# Patient Record
Sex: Male | Born: 1935 | Race: White | Hispanic: No | Marital: Married | Smoking: Former smoker
Health system: Southern US, Community
[De-identification: ages and names within clinical notes are randomized; demographics above are authoritative.]

## PROBLEM LIST (undated history)

## (undated) DIAGNOSIS — R350 Frequency of micturition: Secondary | ICD-10-CM

## (undated) DIAGNOSIS — E7800 Pure hypercholesterolemia, unspecified: Secondary | ICD-10-CM

## (undated) DIAGNOSIS — E119 Type 2 diabetes mellitus without complications: Secondary | ICD-10-CM

## (undated) DIAGNOSIS — E669 Obesity, unspecified: Secondary | ICD-10-CM

## (undated) DIAGNOSIS — I5042 Chronic combined systolic (congestive) and diastolic (congestive) heart failure: Secondary | ICD-10-CM

## (undated) DIAGNOSIS — I442 Atrioventricular block, complete: Secondary | ICD-10-CM

## (undated) DIAGNOSIS — J189 Pneumonia, unspecified organism: Secondary | ICD-10-CM

## (undated) DIAGNOSIS — I10 Essential (primary) hypertension: Secondary | ICD-10-CM

## (undated) DIAGNOSIS — Z950 Presence of cardiac pacemaker: Secondary | ICD-10-CM

## (undated) DIAGNOSIS — R32 Unspecified urinary incontinence: Secondary | ICD-10-CM

## (undated) DIAGNOSIS — I2510 Atherosclerotic heart disease of native coronary artery without angina pectoris: Secondary | ICD-10-CM

## (undated) DIAGNOSIS — C4491 Basal cell carcinoma of skin, unspecified: Secondary | ICD-10-CM

## (undated) DIAGNOSIS — C439 Malignant melanoma of skin, unspecified: Secondary | ICD-10-CM

## (undated) DIAGNOSIS — Z9289 Personal history of other medical treatment: Secondary | ICD-10-CM

## (undated) HISTORY — PX: TONSILLECTOMY: SUR1361

## (undated) HISTORY — PX: APPENDECTOMY: SHX54

## (undated) HISTORY — DX: Essential (primary) hypertension: I10

## (undated) HISTORY — PX: BASAL CELL CARCINOMA EXCISION: SHX1214

## (undated) HISTORY — DX: Personal history of other medical treatment: Z92.89

## (undated) HISTORY — PX: INSERT / REPLACE / REMOVE PACEMAKER: SUR710

## (undated) HISTORY — DX: Atherosclerotic heart disease of native coronary artery without angina pectoris: I25.10

## (undated) HISTORY — DX: Atrioventricular block, complete: I44.2

## (undated) HISTORY — DX: Obesity, unspecified: E66.9

## (undated) HISTORY — PX: MOHS SURGERY: SUR867

---
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
## 1998-07-24 ENCOUNTER — Ambulatory Visit (HOSPITAL_COMMUNITY): Admission: RE | Admit: 1998-07-24 | Discharge: 1998-07-24 | Payer: Self-pay | Admitting: Family Medicine

## 1998-09-08 ENCOUNTER — Ambulatory Visit (HOSPITAL_COMMUNITY): Admission: RE | Admit: 1998-09-08 | Discharge: 1998-09-08 | Payer: Self-pay | Admitting: Family Medicine

## 1999-10-23 ENCOUNTER — Encounter: Payer: Self-pay | Admitting: Family Medicine

## 1999-10-23 ENCOUNTER — Encounter: Admission: RE | Admit: 1999-10-23 | Discharge: 1999-10-23 | Payer: Self-pay | Admitting: Family Medicine

## 2004-07-05 ENCOUNTER — Ambulatory Visit: Payer: Self-pay | Admitting: Cardiology

## 2004-07-05 ENCOUNTER — Inpatient Hospital Stay (HOSPITAL_COMMUNITY): Admission: EM | Admit: 2004-07-05 | Discharge: 2004-07-10 | Payer: Self-pay | Admitting: Emergency Medicine

## 2004-07-05 ENCOUNTER — Encounter: Payer: Self-pay | Admitting: Cardiology

## 2004-07-17 ENCOUNTER — Ambulatory Visit: Payer: Self-pay | Admitting: Pulmonary Disease

## 2004-07-17 ENCOUNTER — Ambulatory Visit (HOSPITAL_BASED_OUTPATIENT_CLINIC_OR_DEPARTMENT_OTHER): Admission: RE | Admit: 2004-07-17 | Discharge: 2004-07-17 | Payer: Self-pay | Admitting: Internal Medicine

## 2004-07-23 ENCOUNTER — Ambulatory Visit: Payer: Self-pay | Admitting: Internal Medicine

## 2004-07-23 ENCOUNTER — Ambulatory Visit: Payer: Self-pay

## 2004-07-24 ENCOUNTER — Ambulatory Visit: Payer: Self-pay

## 2004-10-10 ENCOUNTER — Ambulatory Visit: Payer: Self-pay | Admitting: Internal Medicine

## 2004-11-08 ENCOUNTER — Ambulatory Visit: Payer: Self-pay | Admitting: Internal Medicine

## 2005-07-11 ENCOUNTER — Ambulatory Visit: Payer: Self-pay | Admitting: Internal Medicine

## 2006-01-23 ENCOUNTER — Ambulatory Visit: Payer: Self-pay | Admitting: Internal Medicine

## 2006-07-14 ENCOUNTER — Ambulatory Visit: Payer: Self-pay

## 2006-11-12 ENCOUNTER — Ambulatory Visit: Payer: Self-pay | Admitting: Internal Medicine

## 2007-07-06 ENCOUNTER — Ambulatory Visit: Payer: Self-pay

## 2007-10-07 ENCOUNTER — Ambulatory Visit: Payer: Self-pay | Admitting: Internal Medicine

## 2008-01-12 ENCOUNTER — Ambulatory Visit: Payer: Self-pay | Admitting: Internal Medicine

## 2008-04-13 ENCOUNTER — Ambulatory Visit: Payer: Self-pay | Admitting: Internal Medicine

## 2008-07-22 ENCOUNTER — Ambulatory Visit: Payer: Self-pay | Admitting: Internal Medicine

## 2008-08-26 ENCOUNTER — Encounter: Payer: Self-pay | Admitting: Pulmonary Disease

## 2008-09-09 ENCOUNTER — Ambulatory Visit: Payer: Self-pay | Admitting: Family Medicine

## 2008-09-16 ENCOUNTER — Ambulatory Visit: Payer: Self-pay | Admitting: Family Medicine

## 2008-10-26 ENCOUNTER — Ambulatory Visit: Payer: Self-pay | Admitting: Internal Medicine

## 2008-11-18 ENCOUNTER — Encounter (INDEPENDENT_AMBULATORY_CARE_PROVIDER_SITE_OTHER): Payer: Self-pay

## 2009-01-06 ENCOUNTER — Ambulatory Visit: Payer: Self-pay | Admitting: Family Medicine

## 2009-01-07 DIAGNOSIS — I10 Essential (primary) hypertension: Secondary | ICD-10-CM | POA: Insufficient documentation

## 2009-01-10 ENCOUNTER — Ambulatory Visit: Payer: Self-pay | Admitting: Internal Medicine

## 2009-01-10 DIAGNOSIS — I442 Atrioventricular block, complete: Secondary | ICD-10-CM

## 2009-01-30 ENCOUNTER — Ambulatory Visit: Payer: Self-pay | Admitting: Gastroenterology

## 2009-02-17 ENCOUNTER — Ambulatory Visit: Payer: Self-pay | Admitting: Internal Medicine

## 2009-02-17 ENCOUNTER — Encounter: Payer: Self-pay | Admitting: Internal Medicine

## 2009-02-17 LAB — HM COLONOSCOPY

## 2009-02-21 ENCOUNTER — Encounter: Payer: Self-pay | Admitting: Internal Medicine

## 2009-04-12 ENCOUNTER — Ambulatory Visit: Payer: Self-pay | Admitting: Internal Medicine

## 2009-04-27 ENCOUNTER — Encounter: Payer: Self-pay | Admitting: Internal Medicine

## 2009-05-12 ENCOUNTER — Ambulatory Visit: Payer: Self-pay | Admitting: Family Medicine

## 2009-05-26 ENCOUNTER — Encounter: Payer: Self-pay | Admitting: Family Medicine

## 2009-05-26 ENCOUNTER — Ambulatory Visit: Payer: Self-pay | Admitting: Vascular Surgery

## 2009-05-26 ENCOUNTER — Ambulatory Visit (HOSPITAL_COMMUNITY): Admission: RE | Admit: 2009-05-26 | Discharge: 2009-05-26 | Payer: Self-pay | Admitting: Family Medicine

## 2009-07-20 ENCOUNTER — Encounter: Payer: Self-pay | Admitting: Internal Medicine

## 2009-07-21 ENCOUNTER — Ambulatory Visit: Payer: Self-pay | Admitting: Internal Medicine

## 2009-07-21 DIAGNOSIS — R609 Edema, unspecified: Secondary | ICD-10-CM

## 2009-07-21 DIAGNOSIS — R5383 Other fatigue: Secondary | ICD-10-CM

## 2009-07-21 DIAGNOSIS — R5381 Other malaise: Secondary | ICD-10-CM

## 2009-07-21 DIAGNOSIS — G4733 Obstructive sleep apnea (adult) (pediatric): Secondary | ICD-10-CM | POA: Insufficient documentation

## 2009-07-31 ENCOUNTER — Telehealth (INDEPENDENT_AMBULATORY_CARE_PROVIDER_SITE_OTHER): Payer: Self-pay | Admitting: *Deleted

## 2009-08-07 LAB — CONVERTED CEMR LAB
BUN: 21 mg/dL (ref 6–23)
CO2: 27 meq/L (ref 19–32)
Calcium: 9.5 mg/dL (ref 8.4–10.5)
Glucose, Bld: 128 mg/dL — ABNORMAL HIGH (ref 70–99)
Potassium: 4.2 meq/L (ref 3.5–5.3)
Sodium: 144 meq/L (ref 135–145)

## 2009-08-08 ENCOUNTER — Encounter: Payer: Self-pay | Admitting: Internal Medicine

## 2009-08-11 ENCOUNTER — Ambulatory Visit (HOSPITAL_COMMUNITY): Admission: RE | Admit: 2009-08-11 | Discharge: 2009-08-11 | Payer: Self-pay | Admitting: Internal Medicine

## 2009-08-11 ENCOUNTER — Ambulatory Visit: Payer: Self-pay | Admitting: Cardiovascular Disease

## 2009-08-11 ENCOUNTER — Encounter: Payer: Self-pay | Admitting: Internal Medicine

## 2009-08-11 ENCOUNTER — Ambulatory Visit: Payer: Self-pay

## 2009-08-16 ENCOUNTER — Telehealth (INDEPENDENT_AMBULATORY_CARE_PROVIDER_SITE_OTHER): Payer: Self-pay | Admitting: *Deleted

## 2009-08-29 ENCOUNTER — Ambulatory Visit: Payer: Self-pay | Admitting: Pulmonary Disease

## 2009-08-29 DIAGNOSIS — F341 Dysthymic disorder: Secondary | ICD-10-CM

## 2009-09-11 ENCOUNTER — Ambulatory Visit: Payer: Self-pay | Admitting: Family Medicine

## 2009-10-20 ENCOUNTER — Encounter: Payer: Self-pay | Admitting: Internal Medicine

## 2009-10-25 ENCOUNTER — Ambulatory Visit: Payer: Self-pay | Admitting: Internal Medicine

## 2009-10-31 ENCOUNTER — Encounter: Payer: Self-pay | Admitting: Internal Medicine

## 2009-12-01 ENCOUNTER — Ambulatory Visit: Payer: Self-pay | Admitting: Cardiology

## 2009-12-01 ENCOUNTER — Inpatient Hospital Stay (HOSPITAL_COMMUNITY): Admission: AD | Admit: 2009-12-01 | Discharge: 2009-12-06 | Payer: Self-pay | Admitting: Internal Medicine

## 2009-12-01 ENCOUNTER — Ambulatory Visit: Payer: Self-pay | Admitting: Family Medicine

## 2009-12-02 ENCOUNTER — Encounter (INDEPENDENT_AMBULATORY_CARE_PROVIDER_SITE_OTHER): Payer: Self-pay | Admitting: Internal Medicine

## 2009-12-02 ENCOUNTER — Ambulatory Visit: Payer: Self-pay | Admitting: Vascular Surgery

## 2009-12-03 ENCOUNTER — Encounter (INDEPENDENT_AMBULATORY_CARE_PROVIDER_SITE_OTHER): Payer: Self-pay | Admitting: Internal Medicine

## 2009-12-04 ENCOUNTER — Ambulatory Visit: Payer: Self-pay | Admitting: Infectious Diseases

## 2009-12-11 ENCOUNTER — Ambulatory Visit: Payer: Self-pay | Admitting: Family Medicine

## 2009-12-28 ENCOUNTER — Telehealth: Payer: Self-pay | Admitting: Pulmonary Disease

## 2010-01-11 ENCOUNTER — Ambulatory Visit: Payer: Self-pay | Admitting: Family Medicine

## 2010-01-18 ENCOUNTER — Telehealth: Payer: Self-pay | Admitting: Internal Medicine

## 2010-01-18 ENCOUNTER — Telehealth (INDEPENDENT_AMBULATORY_CARE_PROVIDER_SITE_OTHER): Payer: Self-pay | Admitting: *Deleted

## 2010-01-30 ENCOUNTER — Encounter: Payer: Self-pay | Admitting: Internal Medicine

## 2010-01-31 ENCOUNTER — Ambulatory Visit: Payer: Self-pay | Admitting: Internal Medicine

## 2010-02-01 ENCOUNTER — Ambulatory Visit: Payer: Self-pay | Admitting: Family Medicine

## 2010-02-08 ENCOUNTER — Ambulatory Visit: Payer: Self-pay | Admitting: Family Medicine

## 2010-02-15 ENCOUNTER — Ambulatory Visit: Payer: Self-pay | Admitting: Family Medicine

## 2010-02-21 ENCOUNTER — Encounter: Payer: Self-pay | Admitting: Internal Medicine

## 2010-03-19 ENCOUNTER — Ambulatory Visit: Payer: Self-pay | Admitting: Family Medicine

## 2010-05-10 ENCOUNTER — Encounter: Payer: Self-pay | Admitting: Internal Medicine

## 2010-05-16 ENCOUNTER — Ambulatory Visit: Payer: Self-pay | Admitting: Internal Medicine

## 2010-06-08 ENCOUNTER — Encounter (INDEPENDENT_AMBULATORY_CARE_PROVIDER_SITE_OTHER): Payer: Self-pay | Admitting: *Deleted

## 2010-07-10 ENCOUNTER — Ambulatory Visit: Payer: Self-pay | Admitting: Internal Medicine

## 2010-07-11 ENCOUNTER — Telehealth: Payer: Self-pay | Admitting: Internal Medicine

## 2010-07-19 ENCOUNTER — Ambulatory Visit: Payer: Self-pay | Admitting: Family Medicine

## 2010-07-31 ENCOUNTER — Ambulatory Visit
Admission: RE | Admit: 2010-07-31 | Discharge: 2010-07-31 | Payer: Self-pay | Source: Home / Self Care | Attending: Family Medicine | Admitting: Family Medicine

## 2010-08-31 ENCOUNTER — Ambulatory Visit
Admission: RE | Admit: 2010-08-31 | Discharge: 2010-08-31 | Payer: Self-pay | Source: Home / Self Care | Attending: Family Medicine | Admitting: Family Medicine

## 2010-09-04 NOTE — Cardiovascular Report (Signed)
Summary: Office Visit Remote   Office Visit Remote   Imported By: Roderic Ovens 06/12/2010 15:30:30  _____________________________________________________________________  External Attachment:    Type:   Image     Comment:   External Document

## 2010-09-04 NOTE — Letter (Signed)
Summary: Device-Delinquent Phone Journalist, newspaper, Main Office  [address]   [place], [place] [address]   Phone: [phone]  Fax: [phone]     October 20, 2009 MRN: [id number]   LATASHA PUSKAS [address] [place], [place]  [address]   Dear Mr. NIAZI,  According to our records, you were scheduled for a device phone transmission on   October 18, 2009.     We did not receive any results from this check.  If you transmitted on your scheduled day, please call us to help troubleshoot your system.  If you forgot to send your transmission, please send one upon receipt of this letter.  Thank you,   Architectural technologist [facility]

## 2010-09-04 NOTE — Letter (Signed)
Summary: Remote Device Check  [employer], Main Office  [address]   [place], [place] [address]   Phone: [phone]  Fax: [phone]     June 08, 2010 MRN: [id number]   WALI REINHEIMER [address] [place], [place]  [address]   Dear Mr. HJORT,   Your remote transmission was recieved and reviewed by your physician.  All diagnostics were within normal limits for you.  _____Your next transmission is scheduled for:                       .  Please transmit at any time this day.  If you have a wireless device your transmission will be sent automatically.  _X_____Your next office visit is scheduled for: 07/10/2010 @ 2:40pm with Dr. Graciela Husbands.                                Sincerely,  Altha Harm, LPN

## 2010-09-04 NOTE — Assessment & Plan Note (Signed)
Summary: pc2 medtronic   Visit Type:  Pacemaker check Referring Provider:  [name] Primary Provider:  Sharlot Gowda  CC:  no cardiac complaints.  History of Present Illness: Mr. Malik Rojas is seen in followup for high-grade heart block and presyncope and is status post pacemaker implantation. He has had no recurrent syncope.  he does complain of some exercise intolerance. He is also having problems with peripheral edema.    Since his last visit he underwent echocardiography demonstrating normal left ventricular function and modest elevation of PA pressures.   Things are othewise stable without significant chestpains, changes in breathing status.  His legs remain swollen and red.  He is following closely iwth Dr Susann Givens  Evaluation includes cath 2005 non obstructive CAD wit EF 55% confirmed by echo with mild MR.  Review of his pacemaker records demonstrated that he developed high-grade heart block sometime in 2006 resulting in a nearly 100% ventricular pacing.   Current Medications (verified): 1)  Aspirin 325 Mg Tabs (Aspirin) .Marland Kitchen.. 1 Once Daily 2)  Prinzide 20-25 Mg Tabs (Lisinopril-Hydrochlorothiazide) .... Two Times A Day 3)  Janumet 50-1000 Mg Tabs (Sitagliptin-Metformin Hcl) .... Two Times A Day 4)  Amlodipine Besylate 5 Mg Tabs (Amlodipine Besylate) .... Take Once Daily 5)  Glimepiride 1 Mg Tabs (Glimepiride) .Marland Kitchen.. 1 Two Times A Day 6)  Pravastatin Sodium 80 Mg Tabs (Pravastatin Sodium) .Marland Kitchen.. 1 Once Daily  Allergies: No Known Drug Allergies  Vital Signs:  Patient profile:   77 year old male Height:      67 inches Weight:      244 pounds BMI:     38.35 Pulse rate:   96 / minute Pulse rhythm:   regular BP sitting:   154 / 72  (left arm) Cuff size:   large  Vitals Entered By: Scherrie Bateman, LPN (July 10, 2010 3:03 PM)  Physical Exam  General:  The patient was alert and oriented in no acute distress. HEENT Normal.  Neck veins were flat, carotids were brisk.  Lungs  were clear.  Heart sounds were regular without murmurs or gallops.  Abdomen was soft with active bowel sounds. There is no clubbing cyanosis ; erythema and edema bilaterally 2-3+ Skin Warm and dry    PPM Specifications Following MD:  Sherryl Manges, MD     PPM Vendor:  Medtronic     PPM Model Number:  [id number]     PPM Serial Number:  [id number] PPM DOI:  07/09/2004     PPM Implanting MD:  Sherryl Manges, MD  Lead 1    Location: RA     DOI: 07/09/2004     Model #: [id number]     Serial #: [id number]     Status: active Lead 2    Location: RV     DOI: 07/09/2004     Model #: [id number]     Serial #: [id number]     Status: active  Magnet Response Rate:  BOL 85 ERI  65  Indications:  Syncope;LBBB;Sinus node arrest   PPM Follow Up Remote Check?  No Battery Voltage:  2.96 V     Pacer Dependent:  Yes       PPM Device Measurements Atrium  Amplitude: 4.4 mV, Impedance: 576 ohms, Threshold: 1.6 V at 0.6 msec Right Ventricle  Impedance: 672 ohms, Threshold: 0.5 V at 0.4 msec  Episodes MS Episodes:  1     Percent Mode Switch:  <0.1%     Coumadin:  No Atrial Pacing:  6.9%     Ventricular Pacing:  99.9%  Parameters Mode:  DDDR+     Lower Rate Limit:  60     Upper Rate Limit:  130 Paced AV Delay:  250     Sensed AV Delay:  250 Next Remote Date:  10/11/2010     Next Cardiology Appt Due:  08/05/2010 Tech Comments:  No parameter changes.  Atrial tracking @ 97bpm.  [facility]link transmissions every 3 months.   ROV 1 year with Dr. Graciela Husbands. Altha Harm, LPN  July 10, 2010 3:04 PM   Impression & Recommendations:  Problem # 1:  PACEMAKER, PERMANENT-MEDTRONIC ENRHYTHM P1501DR (ICD-V45.01) Device parameters and data were reviewed and no changes were made  Problem # 2:  SECOND DEGREE ATRIOVENTRICULAR HEART BLOCK (ICD-426.13) stabvle  post pacer;  100% v pacing His updated medication list for this problem includes:    Aspirin 325 Mg Tabs (Aspirin) .Marland Kitchen... 1 once daily    Prinzide 20-25 Mg Tabs  (Lisinopril-hydrochlorothiazide) .Marland Kitchen..Marland Kitchen Two times a day    Amlodipine Besylate 5 Mg Tabs (Amlodipine besylate) .Marland Kitchen... Take once daily  Problem # 3:  EDEMA (ICD-782.3) he is to follow up with Dr Susann Givens in the next two weeks.    Patient Instructions: 1)  Your physician recommends that you continue on your current medications as directed. Please refer to the Current Medication list given to you today. Call us at [phone] to provide an updated list of your medications. 2)  Your physician wants you to follow-up in: 1 year  You will receive a reminder letter in the mail two months in advance. If you don't receive a letter, please call our office to schedule the follow-up appointment.

## 2010-09-04 NOTE — Letter (Signed)
Summary: Remote Device Check  [employer], Main Office  [address]   [place], [place] [address]   Phone: [phone]  Fax: [phone]     October 31, 2009 MRN: [id number]   Malik Rojas [address] [place], [place]  [address]   Dear Mr. UNANGST,   Your remote transmission was recieved and reviewed by your physician.  All diagnostics were within normal limits for you.  __X___Your next transmission is scheduled for: January 31, 2010.  Please transmit at any time this day.  If you have a wireless device your transmission will be sent automatically.     Sincerely,  Proofreader

## 2010-09-04 NOTE — Letter (Signed)
Summary: Device-Delinquent Phone Journalist, newspaper, Main Office  [address]   [place], [place] [address]   Phone: [phone]  Fax: [phone]     May 10, 2010 MRN: [id number]   Malik Rojas [address] [place], [place]  [address]   Dear Mr. OEHLERT,  According to our records, you were scheduled for a device phone transmission on   05-03-10.     We did not receive any results from this check.  If you transmitted on your scheduled day, please call us to help troubleshoot your system.  If you forgot to send your transmission, please send one upon receipt of this letter.  Thank you,   Architectural technologist [facility]

## 2010-09-04 NOTE — Cardiovascular Report (Signed)
Summary: Office Visit Remote   Office Visit Remote   Imported By: Roderic Ovens 11/02/2009 11:58:09  _____________________________________________________________________  External Attachment:    Type:   Image     Comment:   External Document

## 2010-09-04 NOTE — Progress Notes (Signed)
Summary: FYI only  Phone Note Call from Patient   Caller: Patient Call For: Zhane Bluitt Summary of Call: FYI only: pt recently d/c'd from hosp. doesn't want a f/u with vs at this time but will f/u after seeing his pcp. no call back- just FYI since he received a reminder letter to f/u.  Initial call taken by: Tivis Ringer, CNA,  Dec 28, 2009 11:31 AM

## 2010-09-04 NOTE — Cardiovascular Report (Signed)
Summary: Office Visit Remote   Office Visit Remote   Imported By: Roderic Ovens 02/22/2010 10:59:46  _____________________________________________________________________  External Attachment:    Type:   Image     Comment:   External Document

## 2010-09-04 NOTE — Letter (Signed)
Summary: Remote Device Check  [employer], Main Office  [address]   [place], [place] [address]   Phone: [phone]  Fax: [phone]     February 21, 2010 MRN: [id number]   Malik Rojas [address] [place], [place]  [address]   Dear [name],   Your remote transmission was recieved and reviewed by your physician.  All diagnostics were within normal limits for you.  __X___Your next transmission is scheduled for: 05-03-2010.  Please transmit at any time this day.  If you have a wireless device your transmission will be sent automatically.    Sincerely,  Vella Kohler

## 2010-09-04 NOTE — Progress Notes (Signed)
Summary: Dr. Susann Givens to start CPAP  Phone Note Call from Patient Call back at Home Phone [phone]   Caller: Spouse//jolene Call For: sood Reason for Call: Talk to Nurse Summary of Call: Wants to know if sleep study results were sent here. Initial call taken by: Darletta Moll,  January 18, 2010 2:03 PM  Follow-up for Phone Call        Home sleep test ordered by Dr. Graciela Husbands is in the patients chart. The pt's wife is calling today because she says no one has been able to help her with getting the CPAP started. I spoke with Bjorn Loser at Dr. Jola Babinski office and she said they only had record of a sleep study from 2005 and the insurance said that he would need a more recent study. I faxed her a copy of the home test done 1/4/201. The pt's spouse is aware that Dr. Susann Givens will move forward with getting him the CPAP since the pt did not want to come back to see Dr. Craige Cotta. I confirmed that [place] received the sleep study. The pt's spouse and Bjorn Loser were very appreciative for all the help we were able to give. Follow-up by: Michel Bickers CMA,  January 18, 2010 4:33 PM

## 2010-09-04 NOTE — Progress Notes (Signed)
Summary: Malik Rojas**  Phone Note Outgoing Call   Call placed by: Duncan Dull, RN, BSN,  August 16, 2009 10:22 AM Call placed to: Patient Summary of Call: S/W wife and ask her to have [name] call me to discuss ECHO results Per Dr. Graciela Husbands, EF and MR ok. Also to discuss sleep study results. Positive for Mild OSA. Per Dr. Graciela Husbands pt will be referred to Pulmonary Dr. Craige Cotta or Dr. Vassie Loll. Order placed. Duncan Dull, RN, BSN  August 16, 2009 10:23 AM   Follow-up for Phone Call        Pt aware. Follow-up by: Duncan Dull, RN, BSN,  August 18, 2009 12:19 PM

## 2010-09-04 NOTE — Progress Notes (Signed)
Summary: results of sleep test  Phone Note Call from Patient Call back at Home Phone [phone]   Caller: Patient [phone] Reason for Call: Talk to Nurse Summary of Call: pt needs results of sleep apnea test Initial call taken by: Glynda Jaeger,  January 18, 2010 1:29 PM  Follow-up for Phone Call        Pt wife will call Dr. Evlyn Courier office about cpap forms to be sent in to [facility].  Dr. Susann Givens will be taking care of this for Mr. Rowland per his wife. Lisabeth Devoid RN

## 2010-09-04 NOTE — Assessment & Plan Note (Signed)
Summary: SLEEP APNEA///kp   Copy to:  [name] Primary Provider/Referring Provider:  Sharlot Gowda  CC:  Sleep consult. Epworth score is 6. The patient had recent home sleep test ordered by Dr. Graciela Husbands. The patient says he had a sleep study done at the sleep center around 2005.Malik Rojas  History of Present Illness: 77 yo male for evaluation of sleep apnea.  He is not sure why he was scheduled for this visit.  He does not feel like he has any problem with his sleep.  He had a bad experience in the sleep lab several years ago, and is quite adamant about not wanting to have any further in-lab testing.  He had a sleep study from 2005, and was found to have severe sleep apnea.  He has not been using CPAP.  Has gained some weight since his original sleep study.  He has been followed by cardiology, and there was concern that his sleep apnea may be contributing to his cardiovascular disease.  As a result he underwent a home sleep test.  This showed evidence for mild sleep apnea.  He goes to bed at 10pm and falls asleep quickly.  He does not use anything to help him sleep, or stay awake.  He wakes up 2 or 3 times per night to use the bathroom.  It can take sometime to fall back to sleep.  He wakes up at 4am to get ready for work.  He estimates sleeping about 5 hours per night.  He feels okay in the morning, and denies headaches.  He will sometimes get sleepy in the evening while watching TV.  He does not have any problems with his driving.  He is not sure if he snores, and has not been told he stops breathing while asleep.  He denies sleep walking, sleep talking, or bruxism.  He does get occasional nightmares.  He relates to life events that happened years ago, but continue to haunt him.  He was somewhat vague about what these previous events were.  He denies feeling depressed.  He did say that he has led his life, and is willing to accept whatever happens.  As a result he does not want to have to put himself  through too many interventions.  He denies restless legs.  He denies sleep hallucinations, sleep paralysis, or cataplexy.   Preventive Screening-Counseling & Management  Alcohol-Tobacco     Smoking Status: quit     Year Quit: 1975     Pack years: 25 years x4-5 ppd  Current Medications (verified): 1)  Aspirin 81 Mg Tabs (Aspirin) .... Once Daily 2)  Prinzide 20-25 Mg Tabs (Lisinopril-Hydrochlorothiazide) .... Two Times A Day 3)  Janumet 50-1000 Mg Tabs (Sitagliptin-Metformin Hcl) .... Two Times A Day 4)  Actos 30 Mg Tabs (Pioglitazone Hcl) .... Once Daily 5)  Amlodipine Besylate 5 Mg Tabs (Amlodipine Besylate) .... Take Once Daily  Allergies (verified): No Known Drug Allergies  Past History:  Past Medical History: complete heart block Diabetes Hypertension Pacemaker-Medtronic EnRhythm P1501DR Obstructive sleep apnea Edema Obesity  Past Surgical History: Reviewed history from 01/07/2009 and no changes required. appendectomy Pacemaker Implantation  Family History: Reviewed history from 01/07/2009 and no changes required. His mother died at age 31, and his father died at age 84. Neither one had heart disease.   He has three siblings, All negative for heart disease  Social History: Reviewed history from 01/07/2009 and no changes required. Full Time Married  Tobacco Use - Former.  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 Epworth score is 6. The  patient had recent home sleep test ordered by Dr. Graciela Husbands. The patient says he had a sleep study done at the sleep center around 2005.   Physical Exam  General:  normal appearance and obese.   Eyes:  PERRLA and EOMI, wears glasses Nose:  no deformity, discharge, inflammation, or lesions Mouth:  MP 3, upper dentures, poor dentition Neck:  no JVD.   Lungs:  clear bilaterally to auscultation and percussion Heart:  regular rate and rhythm, S1, S2 without murmurs, rubs, gallops, or clicks Abdomen:  bowel sounds positive; abdomen soft and non-tender without masses, or organomegaly Extremities:  no clubbing, cyanosis, edema, or deformity noted Neurologic:  CN II-XII grossly intact with normal reflexes, coordination, muscle strength and tone Cervical Nodes:  no significant adenopathy Psych:  depressed affect and easily distracted.     Impression & Recommendations:  Problem # 1:  OBSTRUCTIVE SLEEP APNEA (ICD-327.23)  I reviewed his sleep tests with him.  His sleep apnea appeared much worse from his sleep study in 2005 compared to his home studies done recently.  He denies having any sleep problems.  I explained to him that his sleep apnea is affecting his blood pressure and diabetes, and that he should view treatment of his sleep apnea like taking medicine for these conditions.  He was not interested in doing any type of therapy for his sleep apnea at this time.  He stated that has lived a full life, and does not want to have to put himself through too many interventions such as using CPAP therapy.  We have agreed to give a trial of diet, exercise, and weight reduction.  He will monitor his sleep pattern and symptoms.  Will re-assess whether he wants to reconsider treating his sleep apnea.  I have also advised him that he may not be allowing himself adequate time for sleep, and that it may be benefitial to adjust his sleep time.  Problem # 2:  DEPRESSION/ANXIETY (ICD-300.4) I am concerned he may  have some degree of depression and possibly post-traumatic stress.  I would recommend that he discuss this further with his primary care physician.  Complete Medication List: 1)  Aspirin 81 Mg Tabs (Aspirin) .... Once daily 2)  Prinzide 20-25 Mg Tabs (Lisinopril-hydrochlorothiazide) .... Two times a day 3)  Janumet 50-1000 Mg Tabs (Sitagliptin-metformin hcl) .... Two times a day 4)  Actos 30 Mg Tabs (Pioglitazone hcl) .... Once daily 5)  Amlodipine Besylate 5 Mg Tabs (Amlodipine besylate) .... Take once daily  Other Orders: Consultation Level IV (86578)  Patient Instructions: 1)  Follow up in 6 months   Immunization History:  Influenza Immunization History:    Influenza:  historical (05/19/2009)  Pneumovax Immunization History:    Pneumovax:  historical (05/19/2009)

## 2010-09-04 NOTE — Progress Notes (Signed)
Summary: meds  Phone Note Call from Patient Call back at Home Phone [phone]   Caller: Patient Reason for Call: Talk to Nurse Summary of Call: list of meds aspirin / amlodipine / glimepirid / prabastain / janument / lisinopril.  pt states these are the only meds he is on.  Initial call taken by: Roe Coombs,  July 11, 2010 10:02 AM  Follow-up for Phone Call        pt confirmed some dosages that were missing...  glimepiride-1mg  daily lisinopril/hctz- 20/12.5 two times a day . not able to update system for some reason. will attempt again later.  Follow-up by: Claris Gladden RN,  July 11, 2010 5:49 PM    New/Updated Medications: LISINOPRIL-HYDROCHLOROTHIAZIDE 20-12.5 MG TABS (LISINOPRIL-HYDROCHLOROTHIAZIDE) two times a day GLIMEPIRIDE 1 MG TABS (GLIMEPIRIDE) once daily

## 2010-10-01 ENCOUNTER — Ambulatory Visit (INDEPENDENT_AMBULATORY_CARE_PROVIDER_SITE_OTHER): Payer: Medicare Other | Admitting: Family Medicine

## 2010-10-01 DIAGNOSIS — L02419 Cutaneous abscess of limb, unspecified: Secondary | ICD-10-CM

## 2010-10-10 ENCOUNTER — Encounter (HOSPITAL_BASED_OUTPATIENT_CLINIC_OR_DEPARTMENT_OTHER): Payer: Medicare Other | Attending: General Surgery

## 2010-10-10 ENCOUNTER — Encounter: Payer: Self-pay | Admitting: Internal Medicine

## 2010-10-10 DIAGNOSIS — L97909 Non-pressure chronic ulcer of unspecified part of unspecified lower leg with unspecified severity: Secondary | ICD-10-CM | POA: Insufficient documentation

## 2010-10-10 DIAGNOSIS — I872 Venous insufficiency (chronic) (peripheral): Secondary | ICD-10-CM | POA: Insufficient documentation

## 2010-10-10 DIAGNOSIS — E119 Type 2 diabetes mellitus without complications: Secondary | ICD-10-CM | POA: Insufficient documentation

## 2010-10-10 DIAGNOSIS — Z79899 Other long term (current) drug therapy: Secondary | ICD-10-CM | POA: Insufficient documentation

## 2010-10-10 DIAGNOSIS — Z950 Presence of cardiac pacemaker: Secondary | ICD-10-CM | POA: Insufficient documentation

## 2010-10-10 DIAGNOSIS — Z7982 Long term (current) use of aspirin: Secondary | ICD-10-CM | POA: Insufficient documentation

## 2010-10-10 DIAGNOSIS — I2510 Atherosclerotic heart disease of native coronary artery without angina pectoris: Secondary | ICD-10-CM | POA: Insufficient documentation

## 2010-10-10 DIAGNOSIS — I10 Essential (primary) hypertension: Secondary | ICD-10-CM | POA: Insufficient documentation

## 2010-10-11 ENCOUNTER — Encounter (INDEPENDENT_AMBULATORY_CARE_PROVIDER_SITE_OTHER): Payer: Medicare Other

## 2010-10-11 DIAGNOSIS — I442 Atrioventricular block, complete: Secondary | ICD-10-CM

## 2010-10-11 DIAGNOSIS — R55 Syncope and collapse: Secondary | ICD-10-CM

## 2010-10-15 ENCOUNTER — Encounter (INDEPENDENT_AMBULATORY_CARE_PROVIDER_SITE_OTHER): Payer: Medicare Other

## 2010-10-15 DIAGNOSIS — L97909 Non-pressure chronic ulcer of unspecified part of unspecified lower leg with unspecified severity: Secondary | ICD-10-CM

## 2010-10-18 ENCOUNTER — Encounter: Payer: Self-pay | Admitting: *Deleted

## 2010-10-23 LAB — GLUCOSE, CAPILLARY
Glucose-Capillary: 135 mg/dL — ABNORMAL HIGH (ref 70–99)
Glucose-Capillary: 137 mg/dL — ABNORMAL HIGH (ref 70–99)
Glucose-Capillary: 156 mg/dL — ABNORMAL HIGH (ref 70–99)
Glucose-Capillary: 170 mg/dL — ABNORMAL HIGH (ref 70–99)
Glucose-Capillary: 184 mg/dL — ABNORMAL HIGH (ref 70–99)
Glucose-Capillary: 189 mg/dL — ABNORMAL HIGH (ref 70–99)
Glucose-Capillary: 190 mg/dL — ABNORMAL HIGH (ref 70–99)
Glucose-Capillary: 229 mg/dL — ABNORMAL HIGH (ref 70–99)
Glucose-Capillary: 131 mg/dL — ABNORMAL HIGH (ref 70–99)
Glucose-Capillary: 142 mg/dL — ABNORMAL HIGH (ref 70–99)
Glucose-Capillary: 143 mg/dL — ABNORMAL HIGH (ref 70–99)
Glucose-Capillary: 171 mg/dL — ABNORMAL HIGH (ref 70–99)

## 2010-10-23 LAB — CBC
Hemoglobin: 14.1 g/dL (ref 13.0–17.0)
MCHC: 34 g/dL (ref 30.0–36.0)
MCV: 95.5 fL (ref 78.0–100.0)
Platelets: 267 10*3/uL (ref 150–400)
Platelets: 167 10*3/uL (ref 150–400)
RBC: 4.51 MIL/uL (ref 4.22–5.81)
RBC: 4.25 MIL/uL (ref 4.22–5.81)
RBC: 4.35 MIL/uL (ref 4.22–5.81)
WBC: 8.2 10*3/uL (ref 4.0–10.5)
WBC: 10 10*3/uL (ref 4.0–10.5)
WBC: 11.1 10*3/uL — ABNORMAL HIGH (ref 4.0–10.5)

## 2010-10-23 LAB — COMPREHENSIVE METABOLIC PANEL
ALT: 15 U/L (ref 0–53)
AST: 20 U/L (ref 0–37)
Alkaline Phosphatase: 61 U/L (ref 39–117)
BUN: 13 mg/dL (ref 6–23)
CO2: 32 mEq/L (ref 19–32)
CO2: 34 mEq/L — ABNORMAL HIGH (ref 19–32)
Calcium: 9.1 mg/dL (ref 8.4–10.5)
Chloride: 101 mEq/L (ref 96–112)
Chloride: 102 mEq/L (ref 96–112)
Creatinine, Ser: 0.97 mg/dL (ref 0.4–1.5)
Creatinine, Ser: 1 mg/dL (ref 0.4–1.5)
GFR calc Af Amer: >60 mL/min (ref >60)
GFR calc non Af Amer: >60 mL/min (ref >60)
GFR calc non Af Amer: >60 mL/min (ref >60)
Glucose, Bld: 161 mg/dL — ABNORMAL HIGH (ref 70–99)
Potassium: 3.7 mEq/L (ref 3.5–5.1)
Total Bilirubin: 0.9 mg/dL (ref 0.3–1.2)
Total Bilirubin: 1.2 mg/dL (ref 0.3–1.2)

## 2010-10-23 LAB — LIPID PANEL
Cholesterol: 139 mg/dL (ref 0–200)
HDL: 36 mg/dL — ABNORMAL LOW (ref >39)
Total CHOL/HDL Ratio: 3.9 RATIO

## 2010-10-23 LAB — CARDIAC PANEL(CRET KIN+CKTOT+MB+TROPI)
CK, MB: 2.5 ng/mL (ref 0.3–4.0)
Relative Index: 1.6 (ref 0.0–2.5)
Relative Index: 1.7 (ref 0.0–2.5)
Total CK: 105 U/L (ref 7–232)
Total CK: 121 U/L (ref 7–232)
Troponin I: 0.02 ng/mL (ref 0.00–0.06)
Troponin I: 0.02 ng/mL (ref 0.00–0.06)

## 2010-10-23 LAB — CULTURE, BLOOD (ROUTINE X 2): Culture: NO GROWTH

## 2010-10-23 LAB — BASIC METABOLIC PANEL
BUN: 15 mg/dL (ref 6–23)
CO2: 34 mEq/L — ABNORMAL HIGH (ref 19–32)
Chloride: 100 mEq/L (ref 96–112)
Creatinine, Ser: 1.08 mg/dL (ref 0.4–1.5)

## 2010-10-23 LAB — D-DIMER, QUANTITATIVE: D-Dimer, Quant: 0.73 ug/mL-FEU — ABNORMAL HIGH (ref 0.00–0.48)

## 2010-10-23 LAB — TSH: TSH: 1.724 u[IU]/mL (ref 0.350–4.500)

## 2010-10-23 LAB — BRAIN NATRIURETIC PEPTIDE: Pro B Natriuretic peptide (BNP): 106 pg/mL — ABNORMAL HIGH (ref 0.0–100.0)

## 2010-10-23 LAB — HEMOGLOBIN A1C: Mean Plasma Glucose: 143 mg/dL — ABNORMAL HIGH (ref <117)

## 2010-10-23 LAB — VANCOMYCIN, TROUGH: Vancomycin Tr: 18.3 ug/mL (ref 10.0–20.0)

## 2010-10-23 NOTE — Letter (Signed)
Summary: Remote Device Check  [employer], Main Office  [address]   [place], [place] [address]   Phone: [phone]  Fax: [phone]     October 18, 2010 MRN: [id number]   Malik Rojas [address] [place], [place]  [address]   Dear [name],   Your remote transmission was recieved and reviewed by your physician.  All diagnostics were within normal limits for you.  ___X__Your next transmission is scheduled for:  01/10/11.  Please transmit at any time this day.  If you have a wireless device your transmission will be sent automatically.  ______Your next office visit is scheduled for:                              . Please call our office to schedule an appointment.    Sincerely,  Altha Harm, LPN

## 2010-11-01 NOTE — Cardiovascular Report (Signed)
Summary: Office Visit   Office Visit   Imported By: Roderic Ovens 10/23/2010 09:12:42  _____________________________________________________________________  External Attachment:    Type:   Image     Comment:   External Document

## 2010-11-07 ENCOUNTER — Encounter (HOSPITAL_BASED_OUTPATIENT_CLINIC_OR_DEPARTMENT_OTHER): Payer: Medicare Other

## 2010-11-11 LAB — GLUCOSE, CAPILLARY
Glucose-Capillary: 108 mg/dL — ABNORMAL HIGH (ref 70–99)
Glucose-Capillary: 130 mg/dL — ABNORMAL HIGH (ref 70–99)

## 2010-11-12 ENCOUNTER — Other Ambulatory Visit: Payer: Self-pay | Admitting: Family Medicine

## 2010-11-12 ENCOUNTER — Encounter: Payer: Self-pay | Admitting: Internal Medicine

## 2010-11-12 ENCOUNTER — Ambulatory Visit (INDEPENDENT_AMBULATORY_CARE_PROVIDER_SITE_OTHER): Payer: Medicare Other | Admitting: Family Medicine

## 2010-11-12 ENCOUNTER — Ambulatory Visit
Admission: RE | Admit: 2010-11-12 | Discharge: 2010-11-12 | Disposition: A | Discharge status: Home / Self Care | Payer: Medicare Other | Source: Ambulatory Visit | Attending: Family Medicine | Admitting: Family Medicine

## 2010-11-12 DIAGNOSIS — R0989 Other specified symptoms and signs involving the circulatory and respiratory systems: Secondary | ICD-10-CM

## 2010-11-12 DIAGNOSIS — I872 Venous insufficiency (chronic) (peripheral): Secondary | ICD-10-CM

## 2010-11-12 DIAGNOSIS — I2510 Atherosclerotic heart disease of native coronary artery without angina pectoris: Secondary | ICD-10-CM

## 2010-11-12 DIAGNOSIS — R5381 Other malaise: Secondary | ICD-10-CM

## 2010-11-12 DIAGNOSIS — R0609 Other forms of dyspnea: Secondary | ICD-10-CM

## 2010-11-14 ENCOUNTER — Encounter (HOSPITAL_BASED_OUTPATIENT_CLINIC_OR_DEPARTMENT_OTHER): Payer: Medicare Other | Attending: Plastic Surgery

## 2010-11-14 DIAGNOSIS — I83893 Varicose veins of bilateral lower extremities with other complications: Secondary | ICD-10-CM | POA: Insufficient documentation

## 2010-11-14 DIAGNOSIS — E119 Type 2 diabetes mellitus without complications: Secondary | ICD-10-CM | POA: Insufficient documentation

## 2010-11-14 DIAGNOSIS — R609 Edema, unspecified: Secondary | ICD-10-CM | POA: Insufficient documentation

## 2010-11-14 DIAGNOSIS — I872 Venous insufficiency (chronic) (peripheral): Secondary | ICD-10-CM | POA: Insufficient documentation

## 2010-11-14 DIAGNOSIS — I10 Essential (primary) hypertension: Secondary | ICD-10-CM | POA: Insufficient documentation

## 2010-11-14 DIAGNOSIS — Z950 Presence of cardiac pacemaker: Secondary | ICD-10-CM | POA: Insufficient documentation

## 2010-11-14 DIAGNOSIS — Z7982 Long term (current) use of aspirin: Secondary | ICD-10-CM | POA: Insufficient documentation

## 2010-11-14 DIAGNOSIS — E785 Hyperlipidemia, unspecified: Secondary | ICD-10-CM | POA: Insufficient documentation

## 2010-11-14 DIAGNOSIS — E669 Obesity, unspecified: Secondary | ICD-10-CM | POA: Insufficient documentation

## 2010-11-14 DIAGNOSIS — Z79899 Other long term (current) drug therapy: Secondary | ICD-10-CM | POA: Insufficient documentation

## 2010-11-19 NOTE — Procedures (Unsigned)
DUPLEX DEEP VENOUS EXAM - LOWER EXTREMITY  INDICATION:  Ulcers.  HISTORY:  Edema:  No. Trauma/Surgery:  No. Pain:  No. PE:  No. Previous DVT:  No. Anticoagulants:  Baby aspirin. Other:  DUPLEX EXAM:               CFV   SFV   PopV  PTV    GSV               R  L  R  L  R  L  R   L  R  L Thrombosis    o  o  o  o  o  o  o   o  o  o Spontaneous   +  +  +  +  +  +  +   +  +  + Phasic        +  +  +  +  +  +  +   +  +  + Augmentation  +  +  +  +  +  +  +   +  +  + Compressible  +  +  +  +  +  +  +   +  +  + Competent     +  +  +  +  +  +  +   +  +  +  Legend:  + - yes  o - no  p - partial  D - decreased  IMPRESSION:  No evidence of acute deep venous thrombosis or superficial thrombophlebitis within lower extremities.  No evidence of valvular incompetency within the greater saphenous vein.   _____________________________ V. Charlena Cross, MD  OD/MEDQ  D:  10/15/2010  T:  10/15/2010  Job:  [id number]

## 2010-11-20 ENCOUNTER — Encounter: Payer: Self-pay | Admitting: Internal Medicine

## 2010-11-21 ENCOUNTER — Encounter: Payer: Self-pay | Admitting: Internal Medicine

## 2010-11-21 ENCOUNTER — Ambulatory Visit (INDEPENDENT_AMBULATORY_CARE_PROVIDER_SITE_OTHER): Payer: Medicare Other | Admitting: Internal Medicine

## 2010-11-21 DIAGNOSIS — I5033 Acute on chronic diastolic (congestive) heart failure: Secondary | ICD-10-CM

## 2010-11-21 DIAGNOSIS — I442 Atrioventricular block, complete: Secondary | ICD-10-CM

## 2010-11-21 DIAGNOSIS — I5032 Chronic diastolic (congestive) heart failure: Secondary | ICD-10-CM | POA: Insufficient documentation

## 2010-11-21 DIAGNOSIS — G4733 Obstructive sleep apnea (adult) (pediatric): Secondary | ICD-10-CM

## 2010-11-21 DIAGNOSIS — I509 Heart failure, unspecified: Secondary | ICD-10-CM

## 2010-11-21 DIAGNOSIS — R609 Edema, unspecified: Secondary | ICD-10-CM

## 2010-11-21 DIAGNOSIS — Z950 Presence of cardiac pacemaker: Secondary | ICD-10-CM | POA: Insufficient documentation

## 2010-11-21 NOTE — Assessment & Plan Note (Signed)
100% ventricular pacing

## 2010-11-21 NOTE — Assessment & Plan Note (Signed)
Unfortunately untreated currently he says because of his lesion on his nose. This will be important to remediate

## 2010-11-21 NOTE — Assessment & Plan Note (Signed)
The patient's device was interrogated.  The information was reviewed. No changes were made in the programming.    

## 2010-11-21 NOTE — Progress Notes (Signed)
  HPI  Malik Rojas is a 77 y.o. male seen in followup for high-grade heart block and presyncope and is status post pacemaker implantation.   He comes in today complaining of severe shortness of breath, orthopnea, nocturnal dyspnea and progressive and unrelenting peripheral edema. He is apparently seen both wound doctors and vascular physicians; it is the family's understanding that the patient had lymphedema. He is managed with only low dose diuretics. His also been told that he had cellulitis and was then treated with an antibiotic. He was admitted for similar complaints in May 2011; it was felt that his sleep apnea was inadequately treated and was continued to the pulmonary hypertension and the peripheral edema was secondary.  He has been unable to use his CPAP because of her recent resection of a skin cancer on his nose  His family said that he is largely immobile because of his respiratory status and he is becoming depressed   Recent echocardiography demonstrating normal left ventricular function and modest elevation of PA pressures. There is no evidence of constriction Evaluation includes cath 2005 non obstructive CAD wit EF 55% confirmed by echo with mild MR.  Review of his pacemaker records demonstrated that he developed high-grade heart block sometime in 2006 resulting in a nearly 100% ventricular pacing.  Past Medical History  Diagnosis Date  . Complete heart block   . Diabetes mellitus   . HTN (hypertension)   . OSA (obstructive sleep apnea)   . Edema   . Obesity     Past Surgical History  Procedure Date  . Pacemaker insertion     medtronic EnRhythm  . Appendectomy     Current Outpatient Prescriptions  Medication Sig Dispense Refill  . amLODipine (NORVASC) 10 MG tablet Take 10 mg by mouth daily.        Marland Kitchen aspirin 325 MG tablet Take 325 mg by mouth daily.        . carvedilol (COREG) 6.25 MG tablet Take 6.25 mg by mouth 2 (two) times daily with a meal.        .  cephALEXin (KEFLEX) 500 MG capsule Take 500 mg by mouth 3 (three) times daily.        Marland Kitchen glimepiride (AMARYL) 1 MG tablet Take 1 mg by mouth 2 (two) times daily.       Marland Kitchen lisinopril-hydrochlorothiazide (PRINZIDE,ZESTORETIC) 20-12.5 MG per tablet Take 1 tablet by mouth daily.        . pravastatin (PRAVACHOL) 80 MG tablet Take 80 mg by mouth daily.        . sitaGLIPtan-metformin (JANUMET) 50-1000 MG per tablet Take 1 tablet by mouth 2 (two) times daily with a meal.        . DISCONTD: amLODipine (NORVASC) 5 MG tablet Take 5 mg by mouth daily.          No Known Allergies  Review of Systems negative except from HPI and PMH  Physical Exam Well developed and well nourished Morbidly obese in mild respiratory distress HENT normal E scleral and icterus clear Neck Supple JVP 10-12 cm; carotids brisk and full Clear to ausculation Regular rate and rhythm, no murmurs gallops or rub Soft with active bowel sounds No clubbing cyanosis; he has 3+ brawny edema and lower extremity erythema; he pits to his knee Alert and oriented, grossly normal motor and sensory function Skin Warm and Dry  ECG P. Synchronous pacing  Assessment and  Plan

## 2010-11-21 NOTE — Assessment & Plan Note (Signed)
The patient has severe and limiting heart failure. I don't think this is lymphedema for the reasons noted above. I discussed this with Dr. Excell Seltzer. We'll plan to bring him in to hospital for IV diuretics. We'll undertake a right heart catheterization. He will need oxygen supplementation we'll certainly for his pulmonary hypertension. My hope is that we could diurese him 20-30 pounds.

## 2010-11-21 NOTE — Assessment & Plan Note (Addendum)
See above; we'll continue his antibiotics

## 2010-11-21 NOTE — Patient Instructions (Signed)
Your physician recommends that you schedule a follow-up appointment in: HOSPITAL TO CALL WITH ROOM NUMBER. Your physician recommends that you continue on your current medications as directed. Please refer to the Current Medication list given to you today.

## 2010-11-22 ENCOUNTER — Inpatient Hospital Stay (HOSPITAL_COMMUNITY)
Admission: RE | Admit: 2010-11-22 | Discharge: 2010-11-29 | DRG: 287 | Disposition: A | Discharge status: Home / Self Care | Payer: Medicare Other | Source: Ambulatory Visit | Attending: Internal Medicine | Admitting: Internal Medicine

## 2010-11-22 DIAGNOSIS — I10 Essential (primary) hypertension: Secondary | ICD-10-CM | POA: Diagnosis present

## 2010-11-22 DIAGNOSIS — I5033 Acute on chronic diastolic (congestive) heart failure: Principal | ICD-10-CM | POA: Diagnosis present

## 2010-11-22 DIAGNOSIS — N289 Disorder of kidney and ureter, unspecified: Secondary | ICD-10-CM | POA: Diagnosis present

## 2010-11-22 DIAGNOSIS — G4733 Obstructive sleep apnea (adult) (pediatric): Secondary | ICD-10-CM | POA: Diagnosis present

## 2010-11-22 DIAGNOSIS — I509 Heart failure, unspecified: Secondary | ICD-10-CM | POA: Diagnosis present

## 2010-11-22 DIAGNOSIS — E669 Obesity, unspecified: Secondary | ICD-10-CM | POA: Diagnosis present

## 2010-11-22 DIAGNOSIS — Z7982 Long term (current) use of aspirin: Secondary | ICD-10-CM

## 2010-11-22 DIAGNOSIS — I872 Venous insufficiency (chronic) (peripheral): Secondary | ICD-10-CM | POA: Diagnosis present

## 2010-11-22 DIAGNOSIS — E119 Type 2 diabetes mellitus without complications: Secondary | ICD-10-CM | POA: Diagnosis present

## 2010-11-22 DIAGNOSIS — R609 Edema, unspecified: Secondary | ICD-10-CM | POA: Diagnosis present

## 2010-11-22 DIAGNOSIS — Z950 Presence of cardiac pacemaker: Secondary | ICD-10-CM

## 2010-11-22 LAB — CBC
Hemoglobin: 13.6 g/dL (ref 13.0–17.0)
MCH: 29.4 pg (ref 26.0–34.0)
MCHC: 32.2 g/dL (ref 30.0–36.0)
MCV: 91.1 fL (ref 78.0–100.0)
RBC: 4.63 MIL/uL (ref 4.22–5.81)

## 2010-11-22 LAB — BASIC METABOLIC PANEL
CO2: 31 mEq/L (ref 19–32)
Calcium: 8.7 mg/dL (ref 8.4–10.5)
Chloride: 104 mEq/L (ref 96–112)
GFR calc Af Amer: >60 mL/min (ref >60)
Glucose, Bld: 119 mg/dL — ABNORMAL HIGH (ref 70–99)
Potassium: 4.3 mEq/L (ref 3.5–5.1)
Sodium: 139 mEq/L (ref 135–145)

## 2010-11-22 LAB — GLUCOSE, CAPILLARY: Glucose-Capillary: 116 mg/dL — ABNORMAL HIGH (ref 70–99)

## 2010-11-23 DIAGNOSIS — I279 Pulmonary heart disease, unspecified: Secondary | ICD-10-CM

## 2010-11-23 LAB — GLUCOSE, CAPILLARY
Glucose-Capillary: 117 mg/dL — ABNORMAL HIGH (ref 70–99)
Glucose-Capillary: 107 mg/dL — ABNORMAL HIGH (ref 70–99)
Glucose-Capillary: 104 mg/dL — ABNORMAL HIGH (ref 70–99)

## 2010-11-23 LAB — POCT I-STAT 3, VENOUS BLOOD GAS (G3P V)
Bicarbonate: 31.2 mEq/L — ABNORMAL HIGH (ref 20.0–24.0)
O2 Saturation: 57 %
TCO2: 34 mmol/L (ref 0–100)
pCO2, Ven: 55.8 mmHg — ABNORMAL HIGH (ref 45.0–50.0)
pCO2, Ven: 57.5 mmHg — ABNORMAL HIGH (ref 45.0–50.0)
pH, Ven: 7.355 — ABNORMAL HIGH (ref 7.250–7.300)
pH, Ven: 7.36 — ABNORMAL HIGH (ref 7.250–7.300)
pO2, Ven: 32 mmHg (ref 30.0–45.0)
pO2, Ven: 32 mmHg (ref 30.0–45.0)

## 2010-11-23 LAB — BASIC METABOLIC PANEL
Calcium: 8.9 mg/dL (ref 8.4–10.5)
GFR calc Af Amer: >60 mL/min (ref >60)
GFR calc non Af Amer: >60 mL/min (ref >60)
Glucose, Bld: 102 mg/dL — ABNORMAL HIGH (ref 70–99)
Potassium: 3.6 mEq/L (ref 3.5–5.1)
Sodium: 139 mEq/L (ref 135–145)

## 2010-11-23 LAB — POCT I-STAT, CHEM 8
BUN: 19 mg/dL (ref 6–23)
Chloride: 91 mEq/L — ABNORMAL LOW (ref 96–112)
Potassium: 3.7 mEq/L (ref 3.5–5.1)
Sodium: 161 mEq/L (ref 135–145)

## 2010-11-24 DIAGNOSIS — I5033 Acute on chronic diastolic (congestive) heart failure: Secondary | ICD-10-CM

## 2010-11-24 LAB — CBC
HCT: 42 % (ref 39.0–52.0)
Hemoglobin: 13.5 g/dL (ref 13.0–17.0)
RDW: 13.9 % (ref 11.5–15.5)
WBC: 8.9 10*3/uL (ref 4.0–10.5)

## 2010-11-24 LAB — BASIC METABOLIC PANEL
CO2: 32 mEq/L (ref 19–32)
Calcium: 8.6 mg/dL (ref 8.4–10.5)
GFR calc Af Amer: >60 mL/min (ref >60)
GFR calc non Af Amer: >60 mL/min (ref >60)
Glucose, Bld: 173 mg/dL — ABNORMAL HIGH (ref 70–99)
Potassium: 3.2 mEq/L — ABNORMAL LOW (ref 3.5–5.1)
Sodium: 138 mEq/L (ref 135–145)

## 2010-11-24 LAB — GLUCOSE, CAPILLARY
Glucose-Capillary: 165 mg/dL — ABNORMAL HIGH (ref 70–99)
Glucose-Capillary: 174 mg/dL — ABNORMAL HIGH (ref 70–99)
Glucose-Capillary: 158 mg/dL — ABNORMAL HIGH (ref 70–99)

## 2010-11-25 DIAGNOSIS — I5031 Acute diastolic (congestive) heart failure: Secondary | ICD-10-CM

## 2010-11-25 LAB — BASIC METABOLIC PANEL
BUN: 25 mg/dL — ABNORMAL HIGH (ref 6–23)
CO2: 34 mEq/L — ABNORMAL HIGH (ref 19–32)
Chloride: 96 mEq/L (ref 96–112)
Creatinine, Ser: 1.27 mg/dL (ref 0.4–1.5)
Glucose, Bld: 127 mg/dL — ABNORMAL HIGH (ref 70–99)
Potassium: 3.8 mEq/L (ref 3.5–5.1)

## 2010-11-25 LAB — GLUCOSE, CAPILLARY
Glucose-Capillary: 170 mg/dL — ABNORMAL HIGH (ref 70–99)
Glucose-Capillary: 152 mg/dL — ABNORMAL HIGH (ref 70–99)
Glucose-Capillary: 133 mg/dL — ABNORMAL HIGH (ref 70–99)

## 2010-11-26 LAB — URINALYSIS, MICROSCOPIC ONLY
Bilirubin Urine: NEGATIVE
Hgb urine dipstick: NEGATIVE
Specific Gravity, Urine: 1.017 (ref 1.005–1.030)
Urobilinogen, UA: 0.2 mg/dL (ref 0.0–1.0)
pH: 5 (ref 5.0–8.0)

## 2010-11-26 LAB — BASIC METABOLIC PANEL
BUN: 30 mg/dL — ABNORMAL HIGH (ref 6–23)
Calcium: 9.1 mg/dL (ref 8.4–10.5)
Creatinine, Ser: 1.3 mg/dL (ref 0.4–1.5)
GFR calc Af Amer: >60 mL/min (ref >60)
GFR calc non Af Amer: 54 mL/min — ABNORMAL LOW (ref >60)

## 2010-11-26 LAB — GLUCOSE, CAPILLARY
Glucose-Capillary: 127 mg/dL — ABNORMAL HIGH (ref 70–99)
Glucose-Capillary: 225 mg/dL — ABNORMAL HIGH (ref 70–99)

## 2010-11-26 LAB — BRAIN NATRIURETIC PEPTIDE: Pro B Natriuretic peptide (BNP): <30 pg/mL (ref 0.0–100.0)

## 2010-11-27 DIAGNOSIS — R609 Edema, unspecified: Secondary | ICD-10-CM

## 2010-11-27 LAB — COMPREHENSIVE METABOLIC PANEL
ALT: 10 U/L (ref 0–53)
AST: 17 U/L (ref 0–37)
Alkaline Phosphatase: 55 U/L (ref 39–117)
CO2: 33 mEq/L — ABNORMAL HIGH (ref 19–32)
Chloride: 99 mEq/L (ref 96–112)
GFR calc Af Amer: 56 mL/min — ABNORMAL LOW (ref >60)
GFR calc non Af Amer: 46 mL/min — ABNORMAL LOW (ref >60)
Glucose, Bld: 117 mg/dL — ABNORMAL HIGH (ref 70–99)
Potassium: 3.6 mEq/L (ref 3.5–5.1)
Sodium: 138 mEq/L (ref 135–145)

## 2010-11-27 LAB — GLUCOSE, CAPILLARY
Glucose-Capillary: 192 mg/dL — ABNORMAL HIGH (ref 70–99)
Glucose-Capillary: 137 mg/dL — ABNORMAL HIGH (ref 70–99)
Glucose-Capillary: 178 mg/dL — ABNORMAL HIGH (ref 70–99)

## 2010-11-28 LAB — PROTEIN, URINE, 24 HOUR
Collection Interval-UPROT: 24 hours
Protein, 24H Urine: 119 mg/d — ABNORMAL HIGH (ref 50–100)
Urine Total Volume-UPROT: 1700 mL

## 2010-11-28 LAB — GLUCOSE, CAPILLARY: Glucose-Capillary: 173 mg/dL — ABNORMAL HIGH (ref 70–99)

## 2010-11-28 LAB — URINALYSIS, ROUTINE W REFLEX MICROSCOPIC
Bilirubin Urine: NEGATIVE
Hgb urine dipstick: NEGATIVE
Ketones, ur: NEGATIVE mg/dL
Nitrite: NEGATIVE
Specific Gravity, Urine: 1.023 (ref 1.005–1.030)
pH: 5 (ref 5.0–8.0)

## 2010-11-28 LAB — BASIC METABOLIC PANEL
BUN: 35 mg/dL — ABNORMAL HIGH (ref 6–23)
CO2: 33 mEq/L — ABNORMAL HIGH (ref 19–32)
Chloride: 97 mEq/L (ref 96–112)
Creatinine, Ser: 1.51 mg/dL — ABNORMAL HIGH (ref 0.4–1.5)
Potassium: 3.7 mEq/L (ref 3.5–5.1)

## 2010-11-29 DIAGNOSIS — I5033 Acute on chronic diastolic (congestive) heart failure: Secondary | ICD-10-CM

## 2010-11-29 LAB — GLUCOSE, CAPILLARY
Glucose-Capillary: 169 mg/dL — ABNORMAL HIGH (ref 70–99)
Glucose-Capillary: 213 mg/dL — ABNORMAL HIGH (ref 70–99)

## 2010-11-29 LAB — BASIC METABOLIC PANEL
BUN: 35 mg/dL — ABNORMAL HIGH (ref 6–23)
CO2: 34 mEq/L — ABNORMAL HIGH (ref 19–32)
Calcium: 9.2 mg/dL (ref 8.4–10.5)
Chloride: 96 mEq/L (ref 96–112)
Creatinine, Ser: 1.4 mg/dL (ref 0.4–1.5)
GFR calc Af Amer: 60 mL/min — ABNORMAL LOW (ref >60)
GFR calc non Af Amer: 49 mL/min — ABNORMAL LOW (ref >60)
Glucose, Bld: 108 mg/dL — ABNORMAL HIGH (ref 70–99)
Potassium: 3.8 mEq/L (ref 3.5–5.1)
Sodium: 141 mEq/L (ref 135–145)

## 2010-11-30 NOTE — Cardiovascular Report (Signed)
  NAMEDEMICHAEL, TRAUM                ACCOUNT NO.:  [id number]  MEDICAL RECORD NO.:  [id number]           PATIENT TYPE:  LOCATION:                                 FACILITY:  PHYSICIAN:  Charmagne Buhl C. Eden Emms, MD, FACCDATE OF BIRTH:  [birth year]  DATE OF PROCEDURE: DATE OF DISCHARGE:                           CARDIAC CATHETERIZATION   RIGHT HEART CATHETERIZATION.  REQUESTING PHYSICIAN:  Duke Salvia, MD, [facility]  Chronic lower extremity edema, rule out pulmonary hypertension, assess adequacy of diuresis.  Standard right heart catheterization was done with a 7-French catheter from the right femoral vein.  Mean right atrial pressure was 13, RV pressure was 42/9, and PA pressure was 38/19 with a mean of 26, mean pulmonary capillary wedge pressure was 23.  Fick cardiac output was 4.9 liters per minute with a cardiac index of 2.3 liters per minute per meter squared.  Arterial sat was 90%, PA sat was 58%.  Calculated pulmonary vascular resistance was 48.  IMPRESSION:  The patient has no significant pulmonary hypertension despite having a pacing wire across his tricuspid valve.  Clinically, he has sleep apnea and lymphedema.  He needs to continue diuresis as evidenced by his pulmonary wedge pressure of 23.  However, it is encouraging at least that his cardiac output was reasonable and there is no evidence of critical pulmonary hypertension. The patient tolerated the procedure well.     Noralyn Pick. Eden Emms, MD, [facility]     PCN/MEDQ  D:  11/23/2010  T:  11/23/2010  Job:  [id number]  Electronically Signed by Charlton Haws MD [facility] on 11/30/2010 11:30:33 AM

## 2010-12-05 ENCOUNTER — Other Ambulatory Visit (INDEPENDENT_AMBULATORY_CARE_PROVIDER_SITE_OTHER): Payer: Medicare Other | Admitting: *Deleted

## 2010-12-05 ENCOUNTER — Encounter: Payer: Self-pay | Admitting: Physician Assistant

## 2010-12-05 ENCOUNTER — Ambulatory Visit (INDEPENDENT_AMBULATORY_CARE_PROVIDER_SITE_OTHER): Payer: Medicare Other | Admitting: Physician Assistant

## 2010-12-05 VITALS — BP 108/60 | HR 71 | Resp 18 | Ht 68.0 in | Wt 229.8 lb

## 2010-12-05 DIAGNOSIS — I5032 Chronic diastolic (congestive) heart failure: Secondary | ICD-10-CM

## 2010-12-05 DIAGNOSIS — I509 Heart failure, unspecified: Secondary | ICD-10-CM

## 2010-12-05 DIAGNOSIS — N289 Disorder of kidney and ureter, unspecified: Secondary | ICD-10-CM

## 2010-12-05 DIAGNOSIS — G4733 Obstructive sleep apnea (adult) (pediatric): Secondary | ICD-10-CM

## 2010-12-05 DIAGNOSIS — R609 Edema, unspecified: Secondary | ICD-10-CM

## 2010-12-05 LAB — BASIC METABOLIC PANEL
BUN: 71 mg/dL — ABNORMAL HIGH (ref 6–23)
CO2: 35 mEq/L — ABNORMAL HIGH (ref 19–32)
Calcium: 10 mg/dL (ref 8.4–10.5)
Chloride: 90 mEq/L — ABNORMAL LOW (ref 96–112)
Creatinine, Ser: 2.2 mg/dL — ABNORMAL HIGH (ref 0.4–1.5)
Glucose, Bld: 110 mg/dL — ABNORMAL HIGH (ref 70–99)

## 2010-12-05 NOTE — Assessment & Plan Note (Signed)
Arrange follow up with sleep medicine.

## 2010-12-05 NOTE — Patient Instructions (Signed)
Your physician recommends that you schedule a follow-up appointment in: 2 MONTHS WITH DR. Graciela Husbands AS PER SCOTT WEAVER, PA-C  You have been referred to RE-ESTABLISH WITH DR. CLANCE 12/07/10 @ 11:30, YOU NEED TO BE THERE BY 11 AM, FOR YOUR SLEEP APNEA AND TO TALK ABOUT GETTING A NEW MASK. THIS WILL BE CONSIDERED A NEW PT VISIT SINCE THE LAST TIME YOU HAVE SEEN DR. Shelle Iron WAS BACK IN 2005.

## 2010-12-05 NOTE — Assessment & Plan Note (Signed)
He is doing well.  His weight is down significantly.  He has chronic edema in his lower extremities.  However, he states that this is much improved.  He is compliant with his compression hose.  Treatment of his sleep apnea is important and he has not been able to tolerate his CPAP.  We will try to make arrangements to help him with this.  He is on a significant amount of diuretics.  A basic metabolic panel will be checked today.  If his renal function and potassium are stable, he can continue his current dosages.  He does not know his medications and we'll check this when he returns home to make sure that we have his correct list in our system.  He will follow up with Dr. Graciela Husbands in 2 months.

## 2010-12-05 NOTE — Progress Notes (Signed)
History of Present Illness: Primary Electrophysiologist:  Dr. Sherryl Manges  Malik Rojas is a 77 y.o. male with a history of hypertension, diabetes, hyperlipidemia, diastolic heart failure and pacemaker implantation secondary to high-grade heart block who was seen in the office on 4/19 in follow up and was noted to be in acute on chronic diastolic heart failure.  He has a long history of chronic lower extremity edema.  This has been described as lymphedema in the past and he has had therapy in the past for cellulitis.  Right heart catheterization was pursued during his admission and demonstrated no significant pulmonary hypertension.  His mean PCWP was elevated at 23 and IV diuresis was pursued.  His weight went from 113.4 kg down to 108 kg.  It was felt that his edema should be treated with diuresis for heart failure, compression stockings and treatment of his sleep apnea.  Amlodipine was discontinued due to problems with edema.  He returns for follow up today.  Hospital labs:  Na 141, K 3.8, BUN 35, Creat 1.4, Cl 96, CO2 34, ALT 10, Hgb 13.5 D/c weight 108 kg (238 pounds).  Doing well.  His weight is down 9 pounds since he left the hospital.  His weights at home have also been going down.  He has adjusted his diet.  He admits to wearing compression hose every day.  He has not been able to tolerate his mask for CPAP and do to a skin cancer treated on his nose causing irritation.  His dyspnea is improved.  He denies chest pain.  He sleeps at an incline.  This has not changed.  His lower extremity edema is much improved.  He denies PND.  He denies syncope.  Past Medical History  Diagnosis Date  . Complete heart block     Status post Medtronic pacemaker  . Diabetes mellitus   . HTN (hypertension)   . OSA (obstructive sleep apnea)   . Edema   . Obesity   . Chronic diastolic heart failure     a. cath 12/05: no CAD, EF 65%;  b. echo 5/11: Ef 60%, mild LVH, mild-mod mitral annular calcification,  mild LAE, mild TR, PASP 48  . HLD (hyperlipidemia)   . CKD (chronic kidney disease)     Current Outpatient Prescriptions  Medication Sig Dispense Refill  . aspirin 325 MG tablet Take 325 mg by mouth daily.        Marland Kitchen glimepiride (AMARYL) 1 MG tablet Take 1 mg by mouth 2 (two) times daily.       Marland Kitchen lisinopril-hydrochlorothiazide (PRINZIDE,ZESTORETIC) 20-12.5 MG per tablet Take 1 tablet by mouth daily.        . pravastatin (PRAVACHOL) 80 MG tablet Take 80 mg by mouth daily.        . sitaGLIPtan-metformin (JANUMET) 50-1000 MG per tablet Take 1 tablet by mouth 2 (two) times daily with a meal.        . DISCONTD: amLODipine (NORVASC) 10 MG tablet Take 10 mg by mouth daily.        Marland Kitchen DISCONTD: carvedilol (COREG) 6.25 MG tablet Take 6.25 mg by mouth 2 (two) times daily with a meal.        . DISCONTD: cephALEXin (KEFLEX) 500 MG capsule Take 500 mg by mouth 3 (three) times daily.          No Known Allergies  Vital Signs: BP 108/60  Resp 18  Ht 5\' 8"  (1.727 m)  Wt 229 lb 12.8 oz (104.237  kg)  BMI 34.94 kg/m2  PHYSICAL EXAM: Well nourished, well developed, in no acute distress HEENT: normal Neck: no JVD Cardiac:  normal S1, S2; RRR; no murmur Lungs:  clear to auscultation bilaterally, no wheezing, rhonchi or rales Abd: soft, nontender, no hepatomegaly Ext: tight 1+ bilateral edema; chronic stasis changes Skin: warm and dry Neuro:  CNs 2-12 intact, no focal abnormalities noted  EKG:  Sinus rhythm, ventricular paced, heart rate 71  ASSESSMENT AND PLAN:

## 2010-12-06 ENCOUNTER — Telehealth: Payer: Self-pay | Admitting: *Deleted

## 2010-12-06 DIAGNOSIS — N289 Disorder of kidney and ureter, unspecified: Secondary | ICD-10-CM

## 2010-12-06 NOTE — Telephone Encounter (Signed)
Needs bmp 12/20/10. Sherri Rad

## 2010-12-07 ENCOUNTER — Encounter: Payer: Self-pay | Admitting: Pulmonary Disease

## 2010-12-07 ENCOUNTER — Ambulatory Visit (INDEPENDENT_AMBULATORY_CARE_PROVIDER_SITE_OTHER): Payer: Medicare Other | Admitting: Pulmonary Disease

## 2010-12-07 DIAGNOSIS — G4733 Obstructive sleep apnea (adult) (pediatric): Secondary | ICD-10-CM

## 2010-12-07 NOTE — Patient Instructions (Signed)
Consider trying a different full face mask ( I showed you the resmed FX quattro), and also trying the "auto machine" for 2-3 weeks to see if more comfortable for you. Work on weight loss Please call us if you would like to work on better treatment for your sleep apnea.

## 2010-12-07 NOTE — Assessment & Plan Note (Signed)
The pt has a h/o mild to moderate osa, but has underlying health issues that can be adversely affected by sleep disordered breathing.  I have encouraged him to try cpap again, and that we are willing to work closely with him on desensitization.  I would try him on a lower profile full face mask, and would also try him on an autoset machine to make the pressure more comfortable.  He really does not want to do this at this time, but will call if he changes his mind.

## 2010-12-07 NOTE — Progress Notes (Signed)
  Subjective:    Patient ID: Malik Rojas, male    DOB: 09-26-1931, 77 y.o.   MRN: [id number]  HPI The pt is a 77y/o male who comes in today for an acute sick visit regarding his osa.  He has a h/o moderate osa dating back to 2005, and a home sleep test last year apparently showed mild osa (report not available to me).  He was seen by Dr. Craige Cotta a year ago, who recommended that he go on cpap.  He decided against this, but ultimately agreed to try cpap after a hospitalization for cardiac issues.  It appears from all of the notes that Dr. Susann Givens was managing this.  The pt currently does not wear cpap, and feels he is doing well.  He thinks the mask is uncomfortable, and also does not like the pressure delivery.  He does not feel he will ever be able to wear cpap on a consistent basis.     Review of Systems  Constitutional: Positive for unexpected weight change. Negative for fever.  HENT: Negative for ear pain, nosebleeds, congestion, sore throat, rhinorrhea, sneezing, trouble swallowing, dental problem, postnasal drip and sinus pressure.   Eyes: Negative for redness and itching.  Respiratory: Positive for shortness of breath. Negative for cough, chest tightness and wheezing.   Cardiovascular: Positive for leg swelling. Negative for palpitations.  Gastrointestinal: Negative for nausea and vomiting.  Genitourinary: Negative for dysuria.  Musculoskeletal: Negative for joint swelling.  Skin: Negative for rash.  Neurological: Negative for headaches.  Hematological: Does not bruise/bleed easily.  Psychiatric/Behavioral: Negative for dysphoric mood. The patient is not nervous/anxious.        Objective:   Physical Exam Obese male in nad Nares with significant narrowing bilat, with deviated septum to left with near obstruction OP with elongation of soft palate and uvula Chest with minimal basilar crackles, no wheezing Cor with rrr, 2/6 sem LE with 2+ edema, venous stasis changes Alert,  oriented, moves all 4        Assessment & Plan:

## 2010-12-17 ENCOUNTER — Encounter (HOSPITAL_BASED_OUTPATIENT_CLINIC_OR_DEPARTMENT_OTHER): Payer: Medicare Other

## 2010-12-18 NOTE — Letter (Signed)
January 12, 2008    Quita Skye. Artis Flock, M.D.  [address]  [place], [place] [address]   RE:  CAMBREN, HELM  MRN:  [id number]  /  DOB:  [birth year]   Dear Rosanne Ashing:   Mr. Mullane comes in today in followup for his pacemaker implanted for  complete heart block and syncope.  He is doing well, without episodes of  lightheadedness, chest pain or shortness of breath.   He does snore and has daytime somnolence, but he had a sleep study  before which he refuses to repeat.   His medications are Norvasc, we think is 10, Janumet 1000 b.i.d., and we  think Prinzide 20/12.5 twice a day, but we are not sure.   PHYSICAL EXAMINATION:  His blood pressure is quite elevated 153/76, and  this is in the setting of his diabetes, with a pulse of 80.  LUNGS:  Clear.  HEART:  Heart sounds were regular.  EXTREMITIES:  Without edema.   Interrogation of his Medtronic EnRhythm pulse generator demonstrates  that he has a P wave of 5.6, with impedance of 512, a threshold 1.5 at  0.6.  He has no intrinsic ventricular rhythm with impedance of 600,  threshold of 0.5 at 0.4.  Battery voltage is 2.97.  He is 95%  ventricularly paced.   IMPRESSION:  1. Complete heart block.  2. Syncope secondary to #1.  3. Status post pacer for the above.  4. Hypertension.  5. Diabetes.  6. Sleep apnea.  7. Nonobstructive coronary disease.   Mr. Ignace, Mandigo, is doing pretty well from a rhythm point of view and  is stable.  I am concerned about his blood pressure in the context of  his diabetes, and I also wonder given his abnormal sleep study whether  it might be worth trying to use CPAP on him.   We will plan to see him again in one year's time.  I have asked him to  follow up with you when he sees you in August about these things.   Thanks very much for the consultation.    Sincerely,      Duke Salvia, MD, [facility]  Electronically Signed    SCK/MedQ  DD: 01/12/2008  DT: 01/12/2008  Job #: [id number]

## 2010-12-20 ENCOUNTER — Other Ambulatory Visit (INDEPENDENT_AMBULATORY_CARE_PROVIDER_SITE_OTHER): Payer: Medicare Other | Admitting: *Deleted

## 2010-12-20 DIAGNOSIS — N289 Disorder of kidney and ureter, unspecified: Secondary | ICD-10-CM

## 2010-12-20 LAB — BASIC METABOLIC PANEL
BUN: 39 mg/dL — ABNORMAL HIGH (ref 6–23)
Creatinine, Ser: 1.5 mg/dL (ref 0.4–1.5)
GFR: 48.27 mL/min — ABNORMAL LOW (ref >60.00)
Glucose, Bld: 136 mg/dL — ABNORMAL HIGH (ref 70–99)
Potassium: 4.2 mEq/L (ref 3.5–5.1)

## 2010-12-21 NOTE — Op Note (Signed)
NAMESTEVENS, Malik Rojas                ACCOUNT NO.:  [id number]   MEDICAL RECORD NO.:  [id number]          PATIENT TYPE:  INP   LOCATION:  4709                         FACILITY:  [facility]   PHYSICIAN:  Duke Salvia, M.D.  DATE OF BIRTH:  03-Sep-1931   DATE OF PROCEDURE:  07/09/2004  DATE OF DISCHARGE:                                 OPERATIVE REPORT   PREOPERATIVE DIAGNOSIS:  Recurrent syncope with left bundle branch block and  intermittent second degree heart block and sinus node arrest.   POSTOPERATIVE DIAGNOSIS:  Recurrent syncope with left bundle branch block  and intermittent second degree heart block and sinus node arrest.   OPERATION PERFORMED:  Dual chamber pacemaker implantation.   SURGEON:  Duke Salvia, M.D.   DESCRIPTION OF PROCEDURE:  Following the obtaining of informed consent, the  patient was brought to the electrophysiology laboratory and placed on the  fluoroscopic table in supine position.  After routine prep and drape, of the  left upper chest, lidocaine was infiltrated in the prepectoral subclavicular  region.  Incision was made and carried down to the layer of the prepectoral  fascia using the electrocautery and sharp dissection.  A pocket was formed  similarly.  Hemostasis was obtained.   Thereafter attention was turned to gaining access to the extrathoracic left  subclavian vein which was accomplished without difficulty, without the  aspiration of air or puncture of the artery.  Two separate venipunctures  were accomplished.  Wires were placed and a 0 silk suture was placed a  figure-of-eight fashion and allowed to hang loosely.   Subsequently 7 French tear-away introducer sheaths were placed through which  were passed sequentially, a Medtronic 5076 active fixation ventricular lead  serial number [id number] and a [employer] passive fixation atrial lead, 1642  serial number [id number].  Under fluoroscopic guidance these were manipulated  to the right ventricular  apex and the right atrial appendage respectively  where the bipolar R-wave was 14.9 mV with pacing impedance of 1396 ohms, a  threshold of 1.3V at 0.5 msec shortly after lead deployment.  Current at threshold of 1.1 mA and there was no diaphragmatic pacing at 10V.   The bipolar P-wave was 4.2 mV with a pacing impedance of 464 ohms, threshold  of 0.6 V at 0.5 msec and a current at threshold was 1.2 mA.  With these  acceptable parameters recorded, the leads were secured to the prepectoral  fascia.  Hemostasis suture was secured.  The leads were attached to a  Medtronic InRhythm [id number] pulse generator, serial [id number].  P-  synchronous pacing was identified.  The pocket was copiously irrigated with  antibiotic containing solution.  Hemostasis was assured.  The leads and  pulse generator were placed in the pocket  and secured to the prepectoral fascia.  The wound was washed, dried and a  benzoin Steri-Strip dressing was applied.  Sponge count, needle count and  instrument count were correct at the end of the procedure according to the  staff.  The patient tolerated the procedure without complication.       SCK/MEDQ  D:  07/09/2004  T:  07/09/2004  Job:  [id number]   cc:   Quita Skye. Artis Flock, M.D.  [address], [address]  [place]  [place] [address]  Fax: [phone]

## 2010-12-21 NOTE — Discharge Summary (Signed)
NAMEKINDRICK, LANKFORD NO.:  [id number]   MEDICAL RECORD NO.:  [id number]          PATIENT TYPE:  INP   LOCATION:  4709                         FACILITY:  [facility]   PHYSICIAN:  Arvilla Meres, M.D. LHCDATE OF BIRTH:  [birth year]   DATE OF ADMISSION:  07/05/2004  DATE OF DISCHARGE:  07/10/2004                                 DISCHARGE SUMMARY   ADDENDUM:  This addendum covers the fact that Mr. Stthomas has been ordered a  sleep study.  This will be a split sleep study to be performed July 10, 2004 at [facility].  The patient will be called and the  results will be faxed to Dr. Duke Salvia at [phone].      Oliver Barre   GM/MEDQ  D:  07/10/2004  T:  07/10/2004  Job:  [id number]

## 2010-12-21 NOTE — H&P (Signed)
NAMEYURIEL, LOPEZMARTINEZ NO.:  [id number]   MEDICAL RECORD NO.:  [id number]          PATIENT TYPE:  EMS   LOCATION:  MAJO                         FACILITY:  [facility]   PHYSICIAN:  Arvilla Meres, M.D. LHCDATE OF BIRTH:  [birth year]   DATE OF ADMISSION:  07/05/2004  DATE OF DISCHARGE:                                HISTORY & PHYSICAL   PHYSICIANS:  Primary care physician:  Quita Skye. Artis Flock, M.D.  Primary cardiologist:  New and will be Arvilla Meres, M.D.   CHIEF COMPLAINT:  Syncope.   HISTORY OF PRESENT ILLNESS:  Mr. Wessinger is a 77 year old white male with no  known history of coronary artery disease.  He had two syncopal episodes and  was sent by his physician to the emergency room for further evaluation and  treatment.   The first syncopal episode was three weeks ago.  It occurred while he was  walking.  He had brief dizziness but states the dizziness did not last long  enough for him to have a chance to sit down.  He states that he lost most of  his vision and hearing, but it only lasted a few seconds.  There was no  sequelae.  He states that he continued his normal activities and without  symptoms.  He had no chest pain or palpitations.  The second episode was  last p.m.; it happened just before supper.  He states he was walking up  steps and had a sudden onset of dizziness and then states that he blacked  out.  He came to with his hand on the doorknob, leaning against the railing,  but feels that if the railing had not been there, he would have fallen.  He  was not incontinent of bowel or bladder.  He had no unilateral weakness or  numbness.  He had no headache or visual changes.  He has not had any recent  travel.  He is generally an active person and never gets chest pain.  He  states that he does not exercise regularly, but he walks several miles a day  at work.   PAST MEDICAL HISTORY:  1.  Diabetes.  2.  Hypertension.   He denies any history of  hyperlipidemia or tobacco use, and there is no  family history of premature coronary artery disease.   PAST SURGICAL HISTORY:  Appendectomy.   ALLERGIES:  No known drug allergies.   MEDICATIONS:  1.  Norvasc 10 mg daily.  2.  Prinizide 10/12.5 mg daily.  3.  Aspirin 325 mg daily.  4.  Glucophage 500 mg b.i.d.   SOCIAL HISTORY:  He lives in [place] with his wife.  He works at the  [employer] three days a week.  He quit tobacco in 1971 and does  not abuse alcohol or drugs.   FAMILY HISTORY:  His mother died at age 76, and his father died at age 58.  Neither one had heart disease.  He has three siblings, and none of them have  heart disease.   REVIEW OF SYSTEMS:  Significant for some chronic dyspnea  on exertion.  I  don't have the wind I used to, but no recent change.  He does not cough or  wheeze.  He does not have arthralgias. He denies hematemesis, hemoptysis, or  melena.  Review of Systems is otherwise negative.   PHYSICAL EXAMINATION:  VITAL SIGNS:  Temperature 98.6, blood pressure  184/90, heart rate 93, respiratory rate 19, O2 saturation 96% on room air.  GENERAL:  Well-developed, elderly white male in no acute distress.  HEENT:  Head is normocephalic and atraumatic with pupils equal, round, and  reactive to light and accommodations. Extraocular movements intact.  NECK:  Supple, and there is no lymphadenopathy, thyromegaly, bruit, or JVD  noted.  CARDIOVASCULAR;  Heart has regular rate and rhythm and no significant  murmurs, rubs, or gallops are noted.  LUNGS:  Clear to auscultation bilaterally.  SKIN:  No rashes or lesions are noted.  ABDOMEN:  Firm and nontender with active bowel sounds and no  hepatosplenomegaly by palpation.  EXTREMITIES:  He has trace edema in the left lower extremity, but distal  pulses are 2+ in all four extremities.  No femoral bruits are appreciated.  MUSCULOSKELETAL:  There is no joint deformity or effusion and no spine or  CVA  tenderness.  NEUROLOGIC:  Alert and oriented. Cranial nerves II-XII grossly intact.   LABORATORY AND X-RAY DATA:  Chest x-ray is pending.   EKG is sinus rhythm, rate 77, with a left bundle branch block.  (No old to  compare.)   Laboratory values are pending.   IMPRESSION AND PLAN:  1.  Syncope.  He has multiple cardiac risk factors and presents with two      episodes of severe presyncope without significant prodrome.  The left      bundle branch block is questionably new.  He has no neurologic symptoms.      The etiology is questionably ischemia versus bradycardia versus tachy      arrhytmias.  He will be admitted, and he will be ruled out for an      myocardial infarction.  He is scheduled for a cardiac catheterization in      the a.m.  We will check an echocardiogram.  We will monitor his rhythm.      If all the above testing is negative, he may need EP consult.  2.  Hypertension.  He will be continued on his current medications.  3.  The patient is otherwise stable and will be continued on his home      medications.  Glucophage is currently held pending use of IV contrast,      but he will be followed on sliding scale insulin.   This is Theodore Demark, P.A. dictating for Arvilla Meres, M.D. who saw  the patient and determined the plan of care.      Rhon   RB/MEDQ  D:  07/05/2004  T:  07/05/2004  Job:  [id number]   cc:   Quita Skye. Artis Flock, M.D.  [address], [address]  [place]  [place] [address]  Fax: [phone]

## 2010-12-21 NOTE — Procedures (Signed)
Malik Rojas, EDELMAN NO.:  [id number]   MEDICAL RECORD NO.:  [id number]          PATIENT TYPE:  OUT   LOCATION:  SLEEP CENTER                 FACILITY[facility]  [facility]   PHYSICIAN:  Marcelyn Bruins, M.D. [facility] DATE OF BIRTH:  1932/01/08   DATE OF STUDY:  07/17/2004                              NOCTURNAL POLYSOMNOGRAM   PROCEDURE:  Polysomnogram.   LOCATION:  Sleep lab.   REFERRING PHYSICIAN:  Sherryl Manges, M.D.   DATE OF STUDY:  July 17, 2004.   INDICATION FOR THE STUDY:  Hypersomnia and sleep apnea.   SLEEP ARCHITECTURE:  The patient had a total sleep time of only 235 minutes  with decreased REM and never achieved slow wave sleep.  Sleep onset latency  and REM onset were normal.  Sleep efficiency was only 54%.   IMPRESSION:  1.  Moderate obstructive sleep apnea/hypopnea syndrome with a respiratory      disturbance index of 31 events per hour and O2 desaturation to 70%.  The      patient did not have enough obstructive events in the allotted time      period to qualify for the split night protocol.  Therefore, the      nocturnal polysomnogram was continued through the whole night.  The      patient spent the whole night in the supine position.  2.  Loud snoring noted throughout the study.  3.  Intermittent rhythm abnormalities consisting of a widened QRS complex      but preceded by a P-wave in each stretch.  I suspect this is secondary      to the patient's known permanent pacemaker.      KC/MEDQ  D:  07/23/2004 14:10:22  T:  07/24/2004 16:03:24  Job:  [id number]

## 2010-12-21 NOTE — Discharge Summary (Signed)
Malik Rojas, Malik Rojas NO.:  [id number]   MEDICAL RECORD NO.:  [id number]          PATIENT TYPE:  INP   LOCATION:  4709                         FACILITY:  [facility]   PHYSICIAN:  Arvilla Meres, M.D. LHCDATE OF BIRTH:  March 28, 1932   DATE OF ADMISSION:  07/05/2004  DATE OF DISCHARGE:                                 DISCHARGE SUMMARY   DISCHARGE DIAGNOSES:  1.  Admission with presyncope x2; Loss of vision and loss of hearing, with      post episodic fatigue.  2.  Left heart catheterization July 06, 2004, with no angiographic      evidence of coronary artery disease; but, evidence of second-degree AV      block during the procedure.  3.  Status post procedure day #1 implantation of Medtronic dual-chamber      pacemaker; __________ and Enrhythm [id number].  4.  Left bundle branch block.  5.  Possible obstructive sleep apnea; outpatient sleep study.   SECONDARY DIAGNOSES:  1.  Diabetes mellitus.  2.  Hypertension.   PROCEDURES:  1.  2-D echocardiogram, July 05, 2004.      1.  Ejection fraction 55-65%.      2.  Left ventricular systolic function normal.      3.  Inadequate to evaluate regional wall motion.      4.  Mild mitral regurgitation; no aortic stenosis, no pericardial          effusion.  2.  Left heart catheterization, July 06, 2004 (Dr. Gala Romney).      1.  Left main was normal.      2.  Left anterior descending was large, wrapping to the apex with large          branches in the first diagonal.      3.  Left circumflex is a large vessel; with a tiny OM1, large OM2, small          OM3 and 20% narrowing at the ostium.      4.  There is a large ramus which was normal.      5.  The right coronary artery is dominant, without coronary artery          disease; normal size PDA; ejection fraction 60-65% and no mitral          regurgitation.  3.  Implantation of Medtronic Enrhythm dual-chamber pacemaker, July 09, 2004 by Dr. Duke Salvia.  The  patient had no complications post      pacer placement.   DISCHARGE DISPOSITION:  Malik Rojas ready for discharge on postprocedure day  #1, after undergoing implantation of dual-chamber pacemaker.  His chest x-  ray shows that leads are in appropriate position, no pneumothorax noted.  The incision is healing nicely, without evidence of erythema or drainage.  The patient was not complaining of pain at the incision site.  The patient  is currently in a sinus rhythm. The patient has had no further presyncopal  episodes during this hospitalization, achieving 96% oxygen saturation on  room air, blood pressure of 153/73.  DISCHARGE MEDICATIONS:  1.  Norvasc 10 mg q.d.  2.  Prinzide 10/12.5 mg q.d.  3.  Enteric-coated aspirin 325 mg q.d.  4.  Glucophage 500 mg b.i.d. (asked to hold off on taking this until Thurs.      July 12, 2004, and then to restart).  5.  For pain at the incision site, the patient is recommended to take      Tylenol 325 mg 1-2 tablets q.4-6h. p.r.n.   SPECIAL INSTRUCTIONS:  Mobility has been discussed involving the left arm  for the next week or so.  The patient is to be discharged on a low sodium,  low cholesterol ADA diet.   FOLLOW UP:  1.  Pacer Clinic at [facility], [address] on Monday,      July 23, 2004 at 9:00 a.m.  2.  Dr. Gala Romney on Tuesday, July 24, 2004 at 10:30 a.m.  3.  Dr. Graciela Husbands October 10, 2004 at 11:45 a.m.   BRIEF HISTORY:  Malik Rojas is a 77 year old male with no prior history of  coronary artery disease.  He had two presyncopal episodes, the first three  weeks ago while walking.  At that time he became dizzy, lost vision and  hearing, but did not fall.  He did not have chest pain or shortness of  breath.  The second episode was on the evening of July 04, 2004 while  walking up steps.  Once again, he lost vision and hearing; there was no  fall, but when he came around he found his hand on the storm door leaning   against the railing.  At that time he had no pain, numbness or weakness.  He  did not have enough warning with these episodes to sit down.  The patient  currently is active; walks to work, does not actively exercise.   The patient was seen on admission by Dr. Gala Romney, who noted that he had  two episodes of severe presyncope without prodrome.  Electrocardiogram shows  left bundle branch block. The patient has no neurologic symptoms.  The  patient will be admitted with the question of ischemia versus bradycardia  versus tachyarrhythmia.  He will be ruled out for myocardial infarction.  An  echocardiogram will be obtained.  He will go for left heart catheterization  and monitored on telemetry.  The patient will most likely need permanent  pacemaker.   HOSPITAL COURSE:  The patient admitted to [facility] through the  emergency room.  He was seen by Duke Salvia, M.D.  He said that the  episodes with their abrupt onset and offset suggest acute bradycardia; in  other words, complete heart block.  However, the patient is complaining of  residual fatigue, which possibly suggests a neurally-mediated spell or  phenomenon.  The patient will present for 2-D echocardiogram.   This study (echocardiogram) was done July 05, 2004 on admission, with the  findings as dictated above.  Normal left ventricular function.  The  following day, July 06, 2004, the patient underwent left heart  catheterization. There was no angiographic evidence of coronary artery  disease.  The patient did have some distinct evidence of second-degree AV  block, and recommended for a permanent pacemaker.  This was scheduled for  July 09, 2004 and implanted by Dr. Sherryl Manges on that date.  The  patient has done well post-implantation.  He goes with the medications and  followup as dictated.  A sleep study will be  arranged for this patient, and he will be contacted as  an outpatient for this.   LABORATORY  STUDIES:  The patient was ruled out for myocardial infarction  with serial cardiac enzymes.  Troponin level I studies in serial fashion are  as follows:  0.02, 0.01, then 0.01.  Lipid studies show:  Cholesterol 174,  triglycerides 286, HDL 33, LDL 84 (obviously this patient will benefit from  statin therapy). The patient did have a serum electrolyte serially  exhaustive on admission, July 05, 2004:  Sodium 140, potassium 3.8,  chloride 105, bicarbonate 28, glucose 119, BUN 16, creatinine 0.9, alkaline  phosphatase 67.  SGOT 21, SGPT 22.  This patient could tolerate statin  therapy.  Complete Blood Count (on admission):  White cells 8.1, hemoglobin  16.1, hematocrit 47.0, platelets 254,000.  Thyroid stimulating hormone  1.432.   PLAN:  Once again the plan includes:  1.  Follow-up appointments for pacemaker check.  2.  Post-catheterization check.  3.  Follow-up outpatient study for sleep apnea.  4.  Recommendation patient to be placed on statin therapy, as ongoing      cardiac risk prevention.      Oliver Barre   GM/MEDQ  D:  07/10/2004  T:  07/10/2004  Job:  [id number]   cc:   Quita Skye. Artis Flock, M.D.  [address], [address]  [place]  [place] [address]  Fax: [phone]   Duke Salvia, M.D.

## 2010-12-21 NOTE — Assessment & Plan Note (Signed)
[facility]                         ELECTROPHYSIOLOGY OFFICE NOTE   NAME:Malik Rojas, Malik Rojas                    MRN:          [id number]  DATE:07/14/2006                            DOB:          1932-07-23    INDICATIONS FOR PROCEDURE:  Malik Rojas was seen in the device clinic  today, July 14, 2006 for routine followup of his Medtronic Enrhythm  dual-chamber pacemaker; implanted July 09, 2006 for syncope.   Upon interrogation battery voltage is 3 V in the atrium.  Intrinsic  amplitude is 5.2 mV with an impedance of 5.1 ohms and threshold of 1 V  at 0.4 msec.  In the right ventricle intrinsic amplitude is 13.6 mV,  with an impedance of 606 ohms and a threshold of 0.5 V at 0.4 msec.   No programming changes were made at this time.  Malik Rojas will begin  [employer] transmissions at 3, 6 and 9 months time; and be seen in one  year's time for followup in Clinic.      Cleatrice Burke, RN  Electronically Signed      Duke Salvia, MD, [facility]  Electronically Signed   CF/MedQ  DD: 07/14/2006  DT: 07/15/2006  Job #: [id number]

## 2010-12-21 NOTE — Cardiovascular Report (Signed)
NAMECODEE, TUTSON NO.:  [id number]   MEDICAL RECORD NO.:  [id number]          PATIENT TYPE:  INP   LOCATION:  4709                         FACILITY:  [facility]   PHYSICIAN:  Arvilla Meres, M.D. LHCDATE OF BIRTH:  1931/12/02   DATE OF PROCEDURE:  07/06/2004  DATE OF DISCHARGE:                              CARDIAC CATHETERIZATION   PRIMARY CARE PHYSICIAN:  Dr. Artis Flock.   CARDIOLOGIST:  Dr. Gala Romney and Dr. Sherryl Manges.   PATIENT ID:  Mr. Ivey is a very pleasant 77 year old male with history of  diabetes and hypertension, but no known coronary disease who was admitted  yesterday through the ER for two episodes of syncope.  He is sent to cardiac  catheterization to define his coronary anatomy.   ACCESS/CATHETERS:  The right groin was injected with 1% lidocaine for a  local anesthesia.  A 6-French sheath was then inserted into the right  femoral artery using a modified Seldinger technique.  Standard catheters  were used throughout the procedure, a JL4, JR4, and 6-French bent pigtail.  All catheter exchanges were made over a wire.   PROCEDURE COMPLICATIONS:  During the procedure there were several episodes  of secondary degree AV block.  These were asymptomatic.  Otherwise, there  were no apparent complications.   FINDINGS:  HEMODYNAMICS:  Aortic pressure 149/78 with a mean of 108.   LV was 153/13.   There was no gradient on pullback across the aortic valve.   CORONARY ANATOMY:  1.  The left main was normal with no angiographic CAD.  2.  LAD was a large vessel wrapping the apex.  There was a very large      branching diagonal.  There was no apparent CAD.  3.  Left circumflex:  This was a large vessel with a tiny OM1, large OM2,      and a small OM3.  There was a minor irregularity in the ostial left      circumflex where it gave off a large ramus.  4.  Ramus intermedius:  This was a large vessel with a mild tapering at the      ostium, otherwise no  angiographic CAD.  5.  Right coronary artery:  This was a dominant vessel with a normal sized      PDA with no angiographic CAD.   LEFT VENTRICULOGRAM:  Left ventriculogram done in the RAO approach showed a  visual EF of 60-65% with no obvious wall motion abnormalities.  There is no  mitral regurgitation.   ASSESSMENT/PLAN:  No evidence of angiographic coronary artery disease.  There was evidence of second degree AV block during the procedure.  He will  follow up with EP regarding the possibility of permanent pacemaker  implantation.  Continue primary prevention strategies for the prevention of  coronary disease.      Dani   DB/MEDQ  D:  07/06/2004  T:  07/07/2004  Job:  [id number]   cc:   Duke Salvia, M.D.

## 2010-12-25 NOTE — Discharge Summary (Signed)
Malik Rojas, Malik Rojas NO.:  [id number]  MEDICAL RECORD NO.:  [id number]           PATIENT TYPE:  I  LOCATION:  4733                         FACILITY:  [facility]  PHYSICIAN:  Duke Salvia, MD, FACCDATE OF BIRTH:  1932/01/16  DATE OF ADMISSION:  11/22/2010 DATE OF DISCHARGE:  11/29/2010                              DISCHARGE SUMMARY   PRIMARY CARDIOLOGIST:  Duke Salvia, MD, [facility]  PRIMARY CARE PROVIDER:  Sharlot Gowda, MD  DISCHARGE DIAGNOSES: 1. Acute on chronic diastolic congestive heart failure. 2. Renal insufficiency. 3. Obstructive sleep apnea. 4. Chronic edema/venous insufficiency.  SECONDARY DIAGNOSES: 1. Complete heart block, status post Medtronic pacemaker. 2. Hypertension. 3. Obesity. 4. Non-insulin-dependent diabetes mellitus.  ALLERGIES:  No known drug allergies.  PROCEDURES/DIAGNOSTICS PERFORMED DURING HOSPITALIZATION:  Right heart catheterization on November 23, 2010.  REASON FOR HOSPITALIZATION:  This is a 77 year old Caucasian male, who was seen in the outpatient clinic for followup of high-grade heart block and presyncope, who is now status post pacemaker implantation.  The patient was complaining of severe shortness of breath, orthopnea, nocturnal dyspnea, and progressive and unrelenting peripheral edema.  He has recently been evaluated by wound doctors and vascular physicians, who diagnosed him with cellulitis and lymphedema.  The patient is on low- dose diuretics.  After evaluation, Dr. Graciela Husbands felt the patient has suffered from acute on chronic diastolic congestive heart failure.  With the patient's symptoms being severe and limiting, the case was discussed with Dr. Excell Seltzer and they felt IV diuretics in hospitalization would be indicated.  The patient also will undergo right heart catheterization. Dr. Graciela Husbands felt that the patient could diurese approximately 20-30 pounds.  HOSPITAL COURSE:  The patient was admitted to the telemetry  unit.  He was then taken to the cardiac cath lab by Dr. Eden Emms, informed consent was obtained.  Right heart catheterization did show no significant pulmonary hypertension, but elevated pulmonary wedge pressure of 23. His Fick cardiac output was 4.9 L/per minute and cardiac input Fick was 2.3 L/m2.  As said that the patient should be continued on IV diuresis.  The patient was placed on IV diuresis and his renal function was followed daily.  His initial weight was noted to be 113.4 kg, his weight at discharge is 108 kg.  An initial BNP was not obtained until after 4 days of diuresis.  On November 26, 2010, the patient's BNP was note to be less than 30.  IV diuresis did improve the patient's orthopnea as well as dyspnea.  He was felt to be back at baseline by the time of discharge.  Dr. Excell Seltzer evaluated the patient while he was in the hospital and he felt the patient's symptoms with the pretibial and calf edema get sparring the dorsum of the feet as well as his diffuse erythema and vesicles was most consistent with chronic stasis and not characteristics of lymphedema.  With the patient's multiple medical problems which include diabetes, obesity, and obstructive sleep apnea, and diastolic heart failure, it was felt the patient should focus on treatment of obstructive sleep apnea, leg elevation and compression infections as well as  diuretics.  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  He has been instructed that once this is healed to continue with CPAP use.  The patient's creatinine was followed closely and this remained stable with a discharge creatinine of 1.40.  Of note, the patient was on amlodipine prior to admission, but with his chronic venous insufficiency, this has been discontinued.  On day of discharge, Dr. Graciela Husbands evaluated the patient and noted him stable for home.  His dyspnea had improved.  He had no  further complaints of orthopnea.  His lower extremities were still with edema, but they have much improved.  He felt the patient was stable for home with close outpatient follow up.  The patient will be continued on IV Lasix with a follow up within the next week.  The patient ambulated in the halls for assessment of home oxygen need. On room air, the patient was able to ambulate without O2 saturation dropping below 94%.  Therefore, the patient will not need home oxygen.  DISCHARGE LABS:  Sodium 141, potassium 3.8, BUN 35, creatinine 1.4.  DISCHARGE MEDICATIONS: 1. Lasix 80 mg 1 tablet twice daily. 2. Labetalol 100 mg 2 tablets every 12 hours. 3. Zaroxolyn 5 mg 1 tablet every Wednesday and Saturday. 4. Aspirin 325 kg daily. 5. Fish oil 1000 mg daily. 6. Amaryl 1 mg twice daily. 7. Janumet 50/1000 one tablet twice daily. 8. Lisinopril/hydrochlorothiazide 20/12.5 mg 1 tablet every morning. 9. Pravastatin 80 mg 1 tablet daily. 10.Please stop taking carvedilol and cephalexin.  FOLLOWUP PLANS AND INSTRUCTIONS: 1. The patient will follow up with Tereso Newcomer, physician assistant     for Dr. Graciela Husbands in 1 week, the office call will call and schedule     this appointment. 2. The patient is to increase activity slowly.  He is to shower, but     no bathing.  He is not to lift anything for 1 week greater than 5     pounds.  There is no sexual activity for 1 week.  He is to keep his     cath site clean and dry, and call the office for any problems. 3. The patient is to continue on a low-sodium heart-healthy diabetic     diet. 4. The patient is to record daily weights and call the office if there     is a 3-pound weight gain overnight for approximately 5 pounds in 1     week.  DURATION OF DISCHARGE:  Greater than 30 minutes with physician and physician extended time.     Leonette Monarch, PA-C   ______________________________ Duke Salvia, MD, [facility]    NB/MEDQ  D:  11/29/2010  T:   11/30/2010  Job:  [id number]  cc:   Sharlot Gowda, M.D.  Electronically Signed by Alen Blew P.A. on 12/06/2010 03:01:00 PM Electronically Signed by Sherryl Manges MD [facility] on 12/25/2010 04:23:50 PM

## 2011-01-10 ENCOUNTER — Encounter: Payer: Self-pay | Admitting: Internal Medicine

## 2011-01-10 ENCOUNTER — Encounter: Payer: Medicare Other | Admitting: *Deleted

## 2011-01-10 ENCOUNTER — Other Ambulatory Visit: Payer: Self-pay | Admitting: Internal Medicine

## 2011-01-10 DIAGNOSIS — I442 Atrioventricular block, complete: Secondary | ICD-10-CM

## 2011-02-08 ENCOUNTER — Encounter: Payer: Self-pay | Admitting: *Deleted

## 2011-02-19 ENCOUNTER — Encounter: Payer: Self-pay | Admitting: Internal Medicine

## 2011-02-19 ENCOUNTER — Ambulatory Visit (INDEPENDENT_AMBULATORY_CARE_PROVIDER_SITE_OTHER): Payer: Medicare Other | Admitting: Internal Medicine

## 2011-02-19 DIAGNOSIS — I442 Atrioventricular block, complete: Secondary | ICD-10-CM

## 2011-02-19 DIAGNOSIS — I5032 Chronic diastolic (congestive) heart failure: Secondary | ICD-10-CM

## 2011-02-19 DIAGNOSIS — I509 Heart failure, unspecified: Secondary | ICD-10-CM

## 2011-02-19 DIAGNOSIS — Z950 Presence of cardiac pacemaker: Secondary | ICD-10-CM

## 2011-02-19 LAB — PACEMAKER DEVICE OBSERVATION
AL AMPLITUDE: 3.7 mv
AL IMPEDENCE PM: 576 Ohm
AL THRESHOLD: 2 V
ATRIAL PACING PM: 25.62
BAMS-0001: 170 {beats}/min
BATTERY VOLTAGE: 2.93 V
RV LEAD IMPEDENCE PM: 712 Ohm
RV LEAD THRESHOLD: 0.5 V
VENTRICULAR PACING PM: 99.96

## 2011-02-19 LAB — BASIC METABOLIC PANEL
BUN: 33 mg/dL — ABNORMAL HIGH (ref 6–23)
CO2: 31 mEq/L (ref 19–32)
Calcium: 9.2 mg/dL (ref 8.4–10.5)
Chloride: 103 mEq/L (ref 96–112)
Creatinine, Ser: 1.5 mg/dL (ref 0.4–1.5)
GFR: 48.25 mL/min — ABNORMAL LOW (ref >60.00)
Glucose, Bld: 178 mg/dL — ABNORMAL HIGH (ref 70–99)
Potassium: 3.9 mEq/L (ref 3.5–5.1)
Sodium: 141 mEq/L (ref 135–145)

## 2011-02-19 LAB — MAGNESIUM: Magnesium: 2.1 mg/dL (ref 1.5–2.5)

## 2011-02-19 NOTE — Progress Notes (Signed)
  HPI  Malik Rojas is a 77 y.o. male  seen in followup for high-grade heart block and presyncope and is status post pacemaker implantation.   One he was seen in April, he complaints of severe shortness of breath, orthopnea, nocturnal dyspnea and progressive and unrelenting peripheral edema.Be admitted to  Hospital where he was diuresed about 11 pounds. This was felt to be secondary to chronic venous stasis And not lymphedema. He was Discharged with advice to keep his legs elevated pursue CPAP and fluid restriction;  He has done relatively well since hospital discharge. His weight is up. We have reviewed his diet. It is loaded with salt. He is not wearing his CPAP so much because he is urinating at night.    Recent echocardiography demonstrating normal left ventricular function and modest elevation of PA pressures Past Medical History  Diagnosis Date  . Complete heart block     Status post Medtronic pacemaker  . Diabetes mellitus   . HTN (hypertension)   . OSA (obstructive sleep apnea)   . Edema   . Obesity   . Chronic diastolic heart failure     a. cath 12/05: no CAD, EF 65%;  b. echo 5/11: Ef 60%, mild LVH, mild-mod mitral annular calcification, mild LAE, mild TR, PASP 48  . HLD (hyperlipidemia)   . CKD (chronic kidney disease)     Past Surgical History  Procedure Date  . Pacemaker insertion     medtronic EnRhythm  . Appendectomy     Current Outpatient Prescriptions  Medication Sig Dispense Refill  . aspirin 325 MG tablet Take 325 mg by mouth daily.        . furosemide (LASIX) 80 MG tablet Take 80 mg by mouth 2 (two) times daily.        Marland Kitchen glimepiride (AMARYL) 1 MG tablet Take 1 mg by mouth 2 (two) times daily.       Marland Kitchen labetalol (NORMODYNE) 100 MG tablet Take 100 mg by mouth. Take 2 tablets every 12 hours       . lisinopril-hydrochlorothiazide (PRINZIDE,ZESTORETIC) 20-12.5 MG per tablet Take 1 tablet by mouth daily.        . Omega-3 Fatty Acids (FISH OIL) 1000 MG CAPS Take  1 capsule by mouth daily.        . pravastatin (PRAVACHOL) 80 MG tablet Take 80 mg by mouth daily.        . sitaGLIPtan-metformin (JANUMET) 50-1000 MG per tablet Take 1 tablet by mouth 2 (two) times daily with a meal.          No Known Allergies  Review of Systems negative except from HPI and PMH  Physical Exam Well developed and well nourished in no acute distress HENT normal E scleral and icterus clear Neck Supple JVP f7-8 cm carotids brisk and full Clear to ausculation Regular rate and rhythm, no murmurs gallops or rub Soft with active bowel sounds No clubbing cyanosis; Significant chronic venous stasis Alert and oriented, grossly normal motor and sensory function Skin Warm and Dry    Assessment and  Plan

## 2011-02-19 NOTE — Assessment & Plan Note (Signed)
As above. We will check his metabolic and profile and magnesium level today.

## 2011-02-19 NOTE — Assessment & Plan Note (Signed)
We have reviewed extensively his dietary intake. We spent about 20 minutes discussing this. He will try to cut down on his salt intake but his bacon, hand, and cheeseburgers don't bode well for this. We'll also plan to have him take his diuretic at the midday so hopefully he can rest better at night and uses CPAP

## 2011-02-19 NOTE — Assessment & Plan Note (Signed)
The patient's device was interrogated.  The information was reviewed. No changes were made in the programming.    

## 2011-02-19 NOTE — Patient Instructions (Signed)
Your physician recommends that you have lab work today: bmp/magnesium (426.0;428.32).  Remote monitoring is used to monitor your Pacemaker of ICD from home. This monitoring reduces the number of office visits required to check your device to one time per year. It allows [place] to keep an eye on the functioning of your device to ensure it is working properly. You are scheduled for a device check from home on 05/23/11. You may send your transmission at any time that day. If you have a wireless device, the transmission will be sent automatically. After your physician reviews your transmission, you will receive a postcard with your next transmission date.  Your physician wants you to follow-up in: 1 year. You will receive a reminder letter in the mail two months in advance. If you don't receive a letter, please call our office to schedule the follow-up appointment.

## 2011-02-19 NOTE — Assessment & Plan Note (Signed)
We reviewed his medications extensively. At his next visit we may anticipate increasing his labetalol.

## 2011-02-19 NOTE — Assessment & Plan Note (Signed)
As above.

## 2011-02-21 ENCOUNTER — Encounter: Payer: Medicare Other | Admitting: *Deleted

## 2011-03-08 ENCOUNTER — Other Ambulatory Visit: Payer: Self-pay

## 2011-03-08 MED ORDER — SITAGLIPTIN PHOS-METFORMIN HCL 50-1000 MG PO TABS: 1.0000 | ORAL_TABLET | Freq: Two times a day (BID) | ORAL | Status: DC

## 2011-04-12 ENCOUNTER — Encounter: Payer: Self-pay | Admitting: Family Medicine

## 2011-04-22 ENCOUNTER — Other Ambulatory Visit: Payer: Medicare Other

## 2011-04-22 ENCOUNTER — Telehealth: Payer: Self-pay | Admitting: Family Medicine

## 2011-04-22 DIAGNOSIS — Z0000 Encounter for general adult medical examination without abnormal findings: Secondary | ICD-10-CM

## 2011-04-22 NOTE — Telephone Encounter (Signed)
DONE

## 2011-05-01 ENCOUNTER — Other Ambulatory Visit: Payer: Self-pay | Admitting: Family Medicine

## 2011-05-01 MED ORDER — SITAGLIPTIN PHOS-METFORMIN HCL 50-1000 MG PO TABS: 1.0000 | ORAL_TABLET | Freq: Two times a day (BID) | ORAL | Status: DC

## 2011-05-01 NOTE — Telephone Encounter (Signed)
DONE

## 2011-05-01 NOTE — Telephone Encounter (Signed)
SAMPLES GIVEN PLEASE SIGN OFF

## 2011-05-23 ENCOUNTER — Ambulatory Visit (INDEPENDENT_AMBULATORY_CARE_PROVIDER_SITE_OTHER): Payer: Medicare Other | Admitting: *Deleted

## 2011-05-23 DIAGNOSIS — F341 Dysthymic disorder: Secondary | ICD-10-CM

## 2011-05-23 DIAGNOSIS — I442 Atrioventricular block, complete: Secondary | ICD-10-CM

## 2011-05-26 LAB — REMOTE PACEMAKER DEVICE
AL AMPLITUDE: 3.8 mv
AL IMPEDENCE PM: 576 Ohm
BAMS-0001: 170 {beats}/min
BATTERY VOLTAGE: 2.93 V
BRDY-0004RA: 130 {beats}/min
VENTRICULAR PACING PM: 99.97

## 2011-05-27 ENCOUNTER — Telehealth: Payer: Self-pay | Admitting: Family Medicine

## 2011-05-27 NOTE — Telephone Encounter (Signed)
Patient is coming in for an ov on Thursday at 930am. CLS

## 2011-05-30 ENCOUNTER — Ambulatory Visit: Payer: Medicare Other | Admitting: Family Medicine

## 2011-05-30 ENCOUNTER — Ambulatory Visit (INDEPENDENT_AMBULATORY_CARE_PROVIDER_SITE_OTHER): Payer: Medicare Other | Admitting: Family Medicine

## 2011-05-30 ENCOUNTER — Encounter: Payer: Self-pay | Admitting: Family Medicine

## 2011-05-30 DIAGNOSIS — Z79899 Other long term (current) drug therapy: Secondary | ICD-10-CM

## 2011-05-30 DIAGNOSIS — E669 Obesity, unspecified: Secondary | ICD-10-CM | POA: Insufficient documentation

## 2011-05-30 DIAGNOSIS — E119 Type 2 diabetes mellitus without complications: Secondary | ICD-10-CM

## 2011-05-30 DIAGNOSIS — I10 Essential (primary) hypertension: Secondary | ICD-10-CM

## 2011-05-30 DIAGNOSIS — I442 Atrioventricular block, complete: Secondary | ICD-10-CM

## 2011-05-30 DIAGNOSIS — G4733 Obstructive sleep apnea (adult) (pediatric): Secondary | ICD-10-CM

## 2011-05-30 DIAGNOSIS — E785 Hyperlipidemia, unspecified: Secondary | ICD-10-CM

## 2011-05-30 LAB — CBC WITH DIFFERENTIAL/PLATELET
Basophils Absolute: 0 10*3/uL (ref 0.0–0.1)
Eosinophils Absolute: 0.2 10*3/uL (ref 0.0–0.7)
Eosinophils Relative: 2 % (ref 0–5)
Lymphs Abs: 1.9 10*3/uL (ref 0.7–4.0)
MCH: 30.2 pg (ref 26.0–34.0)
MCV: 93.2 fL (ref 78.0–100.0)
Neutrophils Relative %: 66 % (ref 43–77)
Platelets: 206 10*3/uL (ref 150–400)
RBC: 4.57 MIL/uL (ref 4.22–5.81)
RDW: 13.1 % (ref 11.5–15.5)
WBC: 8.1 10*3/uL (ref 4.0–10.5)

## 2011-05-30 LAB — COMPREHENSIVE METABOLIC PANEL
ALT: 16 U/L (ref 0–53)
AST: 20 U/L (ref 0–37)
Alkaline Phosphatase: 63 U/L (ref 39–117)
Creat: 1.34 mg/dL (ref 0.50–1.35)
Sodium: 140 mEq/L (ref 135–145)
Total Bilirubin: 0.6 mg/dL (ref 0.3–1.2)
Total Protein: 7.4 g/dL (ref 6.0–8.3)

## 2011-05-30 LAB — LIPID PANEL
LDL Cholesterol: 59 mg/dL (ref 0–99)
Total CHOL/HDL Ratio: 3.6 Ratio
VLDL: 33 mg/dL (ref 0–40)

## 2011-05-30 MED ORDER — GLIMEPIRIDE 1 MG PO TABS: 1.0000 mg | ORAL_TABLET | Freq: Two times a day (BID) | ORAL | Status: DC

## 2011-05-30 NOTE — Patient Instructions (Signed)
Continue on all your present medications. Call me if you need any other medications renewed.

## 2011-05-30 NOTE — Progress Notes (Signed)
  Subjective:    Patient ID: Malik Rojas, male    DOB: 01-22-1932, 77 y.o.   MRN: [id number]  HPI He is here for a recheck. He recently saw his cardiologist and that note was reviewed. He continues on medications listed in the chart. Presently he is not using his CPAP machine. He has had difficulty with this and stopped using it. His weight is unchanged. He does get minimal months of exercise. He does not smoke or drink. He does check his feet regularly and apparently has an eye exam set up in the near future. He is not complaining of chest pain, shortness of breath, PND.   Review of Systems     Objective:   Physical Exam Alert and in no distress. Hemoglobin A1c is 7.8. Foot exam shows good pulses with loss of ankle reflexes, skin is normal.. Some slight edema is noted.       Assessment & Plan:   1. Essential hypertension, benign  CBC with Differential, Comprehensive metabolic panel, Lipid panel  2. Diabetes mellitus  POCT UA - Microalbumin  3. Hyperlipidemia LDL goal <70  Lipid panel  4. OBSTRUCTIVE SLEEP APNEA    5. Obesity (BMI 30-39.9)  CBC with Differential, Comprehensive metabolic panel, Lipid panel  6. Complete heart block    7. Encounter for long-term (current) use of other medications  CBC with Differential, Comprehensive metabolic panel, Lipid panel

## 2011-06-04 ENCOUNTER — Encounter: Payer: Self-pay | Admitting: *Deleted

## 2011-06-04 NOTE — Progress Notes (Signed)
Pacer remote check  

## 2011-06-21 ENCOUNTER — Telehealth: Payer: Self-pay | Admitting: Family Medicine

## 2011-06-21 NOTE — Telephone Encounter (Signed)
DONE

## 2011-06-24 NOTE — H&P (Signed)
  NAMEKORBY, RATAY NO.:  [id number]  MEDICAL RECORD NO.:  [id number]           PATIENT TYPE:  I  LOCATION:  FOOT                         FACILITY:  [facility]  PHYSICIAN:  Ardath Sax, M.D.     DATE OF BIRTH:  [birth year]  DATE OF ADMISSION:  10/10/2010 DATE OF DISCHARGE:                             HISTORY & PHYSICAL   This is a very obese gentleman, who has a history of type 2 diabetes. He has arteriosclerotic heart disease, coronary artery disease, history of a heart block.  He has hypertension, and he has a pacemaker.  He takes 40 of Lasix, glimepiride, pravastatin, lisinopril, aspirin, and Coreg.  He has very edematous legs with cellulitis and he has got typical venous stasis ulcers that we are treating with Unna boots, and we also put some Prisma on the wounds, so really his primary diagnosis is venous stasis ulcers and we are treating with Unna boots and Prisma dressings.  He will come back in a week.     Ardath Sax, M.D.PP/MEDQ  D:  10/10/2010  T:  10/11/2010  Job:  [id number]  Electronically Signed by Ardath Sax  on 06/24/2011 02:03:25 PM

## 2011-08-22 ENCOUNTER — Encounter: Payer: Self-pay | Admitting: Internal Medicine

## 2011-08-22 ENCOUNTER — Ambulatory Visit (INDEPENDENT_AMBULATORY_CARE_PROVIDER_SITE_OTHER): Payer: Medicare Other | Admitting: *Deleted

## 2011-08-22 DIAGNOSIS — I442 Atrioventricular block, complete: Secondary | ICD-10-CM

## 2011-08-25 LAB — REMOTE PACEMAKER DEVICE
AL AMPLITUDE: 3.7 mv
AL IMPEDENCE PM: 552 Ohm
ATRIAL PACING PM: 32.05
BAMS-0001: 170 {beats}/min
BRDY-0002RA: 60 {beats}/min
RV LEAD IMPEDENCE PM: 664 Ohm

## 2011-08-27 ENCOUNTER — Encounter: Payer: Self-pay | Admitting: *Deleted

## 2011-08-27 ENCOUNTER — Ambulatory Visit (INDEPENDENT_AMBULATORY_CARE_PROVIDER_SITE_OTHER): Payer: Medicare Other | Admitting: Medical

## 2011-08-27 ENCOUNTER — Encounter: Payer: Self-pay | Admitting: Medical

## 2011-08-27 VITALS — BP 160/90 | HR 62 | Temp 98.3°F | Resp 16 | Wt 238.0 lb

## 2011-08-27 DIAGNOSIS — I10 Essential (primary) hypertension: Secondary | ICD-10-CM

## 2011-08-27 DIAGNOSIS — R040 Epistaxis: Secondary | ICD-10-CM

## 2011-08-27 DIAGNOSIS — K117 Disturbances of salivary secretion: Secondary | ICD-10-CM

## 2011-08-27 NOTE — Patient Instructions (Signed)
I suspect the nose bleed currently is due to dry air. Begin Ayr lotion in the nostril as directed.  This is available OTC.  If you get another nosebleed, hold the soft part of the nose with pressure for at least 5 minutes.   Consider using a humidifier in the home.   Increase your water intake.  If the nosebleeds continue, return to see Dr. Susann Givens.  If no improvement, we can also consider referral to ENT specialist.    We will call your pharmacy to verify BP mediation, as I will likely increase your Coreg dose since your blood pressure is not controlled.

## 2011-08-27 NOTE — Progress Notes (Signed)
  Subjective:   HPI Malik Rojas is a 78 y.o. male who presents for nosebleeds.  He has had 3 in the past 5 days, all from right side.  He did have skin biopsy of right nare recently due to cancerous cells, but he thinks they got all of the cells.  He denies digital trauma.  He does note that house may seem dry.  They use butane heat.  The nosebleeds last week took close to 2 hours to get to stop, but the one this morning took about 30 minutes.  He was taking Aspirin 325 mg, but stopped temporarily with the nosebleeds.  He denies allergy problems.  His wife notes that his BPs have not been controlled.  Recent pressures include 177/125 with pulse 67, 129/75 with pulse of 76, and 158/110.  He has a pacemaker and has cardiology f/u with Dr. Graciela Husbands next month.  No other aggravating or relieving factors.  No other c/o.  The following portions of the patient's history were reviewed and updated as appropriate: allergies, current medications, past family history, past medical history, past social history, past surgical history and problem list.  Past Medical History  Diagnosis Date  . Complete heart block     Status post Medtronic pacemaker  . Diabetes mellitus   . HTN (hypertension)   . OSA (obstructive sleep apnea)   . Edema   . Obesity   . Chronic diastolic heart failure     a. cath 12/05: no CAD, EF 65%;  b. echo 5/11: Ef 60%, mild LVH, mild-mod mitral annular calcification, mild LAE, mild TR, PASP 48  . HLD (hyperlipidemia)   . CKD (chronic kidney disease)    Review of Systems Gen: no fever, chills, sweats HEENT: right side nosebleed, but otherwise negative Heart: no CP, palpitations Lungs: no SOB or wheezing GI: negative    Objective:   Physical Exam  General appearance: alert, no distress, WD/WN Skin: Right nasal fold with scarring from recent skin biopsy HEENT: TMs pearly, no sinus tenderness, right nare with anterior lateral erythematous crusting from recent bleed, bilat nares  inflamed, but no obvious mass, pharynx normal Oral cavity: no mass, but dry mucous membranes to some extent Heart: RRR, brief II/VI systolic murmur heard in upper sternal borders bilat, pacemaker present on left chest,  Lungs: CTA Bilat Pulses: normal, 2+    Assessment and Plan:    Encounter Diagnoses  Name Primary?  . Essential hypertension, benign Yes  . Epistaxis   . Xerostomia    HTN - will call his pharmacy to verify his medications, and we'll increase his Coreg today.  Epistaxis - advise she try Ayr cream over-the-counter intranasal on the right side.  Discussed ways to hold pressure and stop nosebleeds in case this happens again.  Consider running a humidifier at home. If he continues having nosebleeds he'll return to see Dr. Susann Givens here.  Xerostomia - increase water intake in general  Follow up with cardiology next month as planned.

## 2011-08-28 ENCOUNTER — Encounter: Payer: Self-pay | Admitting: Medical

## 2011-08-28 ENCOUNTER — Ambulatory Visit (INDEPENDENT_AMBULATORY_CARE_PROVIDER_SITE_OTHER): Payer: Medicare Other | Admitting: Medical

## 2011-08-28 ENCOUNTER — Other Ambulatory Visit: Payer: Self-pay | Admitting: Medical

## 2011-08-28 ENCOUNTER — Telehealth: Payer: Self-pay | Admitting: Internal Medicine

## 2011-08-28 ENCOUNTER — Telehealth: Payer: Self-pay | Admitting: Family Medicine

## 2011-08-28 VITALS — BP 180/88 | HR 68 | Temp 98.2°F | Resp 16 | Wt 235.0 lb

## 2011-08-28 DIAGNOSIS — R040 Epistaxis: Secondary | ICD-10-CM

## 2011-08-28 DIAGNOSIS — I10 Essential (primary) hypertension: Secondary | ICD-10-CM

## 2011-08-28 DIAGNOSIS — I110 Hypertensive heart disease with heart failure: Secondary | ICD-10-CM | POA: Insufficient documentation

## 2011-08-28 MED ORDER — LABETALOL HCL 300 MG PO TABS: 300.0000 mg | ORAL_TABLET | Freq: Two times a day (BID) | ORAL | Status: DC

## 2011-08-28 NOTE — Telephone Encounter (Signed)
Message copied by Janeice Robinson on Wed Aug 28, 2011 12:01 PM ------      Message from: Aleen Campi, DAVID S      Created: Wed Aug 28, 2011  8:06 AM       pls call his pharmacy and verify his dose of Coreg.  I need to increase once I find out his dose and how often he takes it

## 2011-08-28 NOTE — Telephone Encounter (Signed)
I called and spoke with Kristian Covey, PA. He reports the patient's BP today aw 180/88. His home readings per the patient's wife, have been 160/100 on average. He was wanting to know from cardiology what could be done with his BP meds. Dr. Graciela Husbands did get on the phone with [facility] and recommended that labetalol could potentially be increased to 400 mg BID. He is also on ASA 325 mg once daily. Per Dr. Graciela Husbands, we have no documented history of CAD, so the patient could potentially decrease aspirin to 81 mg - 162 mg daily.

## 2011-08-28 NOTE — Telephone Encounter (Signed)
Vincenza Hews he is not taking coreg anymore.  He is on lisinopril 20/12.5. CLS

## 2011-08-28 NOTE — Progress Notes (Signed)
  Subjective:   HPI Malik Rojas is a 78 y.o. male who presents for nosebleeds.   I saw him yesterday for the same.  This morning he awoke with another nosebleed that he could not get to stop. Thus he decided to come in. He did bring his medications today.   He has had 3 in the past 5 days, all from right side.  No other aggravating or relieving factors.  No other c/o.  The following portions of the patient's history were reviewed and updated as appropriate: allergies, current medications, past family history, past medical history, past social history, past surgical history and problem list.  Past Medical History  Diagnosis Date  . Complete heart block     Status post Medtronic pacemaker  . Diabetes mellitus   . HTN (hypertension)   . OSA (obstructive sleep apnea)   . Edema   . Obesity   . Chronic diastolic heart failure     a. cath 12/05: no CAD, EF 65%;  b. echo 5/11: Ef 60%, mild LVH, mild-mod mitral annular calcification, mild LAE, mild TR, PASP 48  . HLD (hyperlipidemia)   . CKD (chronic kidney disease)    Review of Systems Gen: no fever, chills, sweats HEENT: right side nosebleed, but otherwise negative Heart: no CP, palpitations Lungs: no SOB or wheezing GI: negative    Objective:   Physical Exam  General appearance: alert, no distress, WD/WN Skin: Right nasal fold with scarring from recent skin biopsy Nares:  Dry blood in both nares, some bright red blood and right narrowing, bilateral congestion   Assessment and Plan:    Encounter Diagnoses  Name Primary?  . Epistaxis Yes  . HTN (hypertension), benign    Epistaxis- discussed case with the supervising physician Dr. Susann Givens, and we initially cleaned out the dried bloody debris, then held cotton ball with 1% lidocaine with epi in the right nare with direct pressure for about 5 minutes.  Used silver nitrate to cauterize small active bleeding area in the right nare. The left nare had already stopped bleeding.    Advised if this recurs, then we will refer to ENT.  HTN - requested last OV note from Cardiology to verify medication recommendations.  At last visit, he though he was on Coreg, but is not actually taking this.   We will make medication modification once I speak to cardiology.  I called Dr. Odessa Fleming nurse to have them return my call.  His BP is not at goal.   After speaking to cardiology, Dr. Graciela Husbands, he recommended increasing the labetalol.  Thus we will increase labetalol to 300 mg twice a day.  He also advised that there is no specific indication for him to be on aspirin. Thus he can either stop aspirin, or continue at lower dose such as 81 mg a day.   Follow up cardiology next month as planned.

## 2011-08-28 NOTE — Telephone Encounter (Signed)
Pt has had nose bleeds for the last two days and they did not want to make any changes w/o talking to [place] first

## 2011-08-29 NOTE — Progress Notes (Signed)
Patient was notified of his change in his medication and to f/u with his cardiology next month. CLS

## 2011-09-02 ENCOUNTER — Encounter: Payer: Self-pay | Admitting: Internal Medicine

## 2011-09-02 ENCOUNTER — Ambulatory Visit (INDEPENDENT_AMBULATORY_CARE_PROVIDER_SITE_OTHER): Payer: Medicare Other | Admitting: Medical

## 2011-09-02 ENCOUNTER — Encounter: Payer: Self-pay | Admitting: Medical

## 2011-09-02 ENCOUNTER — Telehealth: Payer: Self-pay | Admitting: Internal Medicine

## 2011-09-02 VITALS — BP 130/70 | HR 62 | Temp 98.2°F | Resp 16 | Wt 237.0 lb

## 2011-09-02 DIAGNOSIS — R040 Epistaxis: Secondary | ICD-10-CM

## 2011-09-02 DIAGNOSIS — I10 Essential (primary) hypertension: Secondary | ICD-10-CM

## 2011-09-02 NOTE — Progress Notes (Signed)
Remote pacer check  

## 2011-09-02 NOTE — Progress Notes (Signed)
Subjective: Here for recheck on nosebleeds and blood pressure.  We saw him last week, did a simple cautery in the right nare for an active bleeder.   Over the weekend however he had 3 more nosebleeds on Sunday, yesterday.  He also started the increased dose of labetalol 300 mg twice a day on Saturday.  His blood pressures are a looking better.  Objective: Gen.: Well-developed, well-nourished, no acute distress HEENT: Right nare with dry black clotted blood, making exam difficult, left nares patent without any obvious bleeding  Assessment: Encounter Diagnoses  Name Primary?  . Essential hypertension, benign Yes  . Epistaxis     Plan: Hypertension-much improved, continue same medication including the recent increase in his labetalol 300 mg twice a day.  Epistaxis-we called and ENT will see him today to further evaluate his nosebleeds although he is not bleeding currently.

## 2011-09-02 NOTE — Telephone Encounter (Signed)
error 

## 2011-09-10 ENCOUNTER — Telehealth: Payer: Self-pay | Admitting: Internal Medicine

## 2011-09-10 MED ORDER — LISINOPRIL-HYDROCHLOROTHIAZIDE 20-12.5 MG PO TABS: 1.0000 | ORAL_TABLET | Freq: Every day | ORAL | Status: DC

## 2011-09-10 NOTE — Telephone Encounter (Signed)
Sent med in 

## 2011-09-24 ENCOUNTER — Other Ambulatory Visit: Payer: Self-pay | Admitting: Family Medicine

## 2011-10-01 ENCOUNTER — Encounter: Payer: Self-pay | Admitting: Family Medicine

## 2011-10-01 ENCOUNTER — Ambulatory Visit (INDEPENDENT_AMBULATORY_CARE_PROVIDER_SITE_OTHER): Payer: Medicare Other | Admitting: Family Medicine

## 2011-10-01 DIAGNOSIS — I442 Atrioventricular block, complete: Secondary | ICD-10-CM

## 2011-10-01 DIAGNOSIS — E119 Type 2 diabetes mellitus without complications: Secondary | ICD-10-CM

## 2011-10-01 DIAGNOSIS — Z950 Presence of cardiac pacemaker: Secondary | ICD-10-CM

## 2011-10-01 DIAGNOSIS — E785 Hyperlipidemia, unspecified: Secondary | ICD-10-CM

## 2011-10-01 DIAGNOSIS — E669 Obesity, unspecified: Secondary | ICD-10-CM

## 2011-10-01 DIAGNOSIS — E0821 Diabetes mellitus due to underlying condition with diabetic nephropathy: Secondary | ICD-10-CM | POA: Insufficient documentation

## 2011-10-01 DIAGNOSIS — I10 Essential (primary) hypertension: Secondary | ICD-10-CM

## 2011-10-01 LAB — POCT UA - MICROALBUMIN
Creatinine, POC: 92.5 mg/dL
Microalbumin Ur, POC: >300 mg/dL

## 2011-10-01 LAB — POCT GLYCOSYLATED HEMOGLOBIN (HGB A1C): Hemoglobin A1C: 6.9

## 2011-10-01 MED ORDER — GLIMEPIRIDE 1 MG PO TABS: 1.0000 mg | ORAL_TABLET | Freq: Two times a day (BID) | ORAL | Status: DC

## 2011-10-01 NOTE — Progress Notes (Signed)
  Subjective:    Patient ID: Malik Rojas, male    DOB: 1931-09-18, 78 y.o.   MRN: [id number]  HPI He is here for a recheck. He continues on medications listed in the chart. He has no particular concerns or complaints. He does not smoke. He has 2 or 3 drinks per year. His exercise is quite minimal. He had an eye exam 2 weeks ago. His wife checks his feet periodically. He continues to use support hose for treatment of dependent edema.  Review of Systems     Objective:   Physical Exam Alert and in no distress. Lower extremity exam does show 1+ pitting edema with some hyperpigmentation changes noted especially over both shins. Decreased sensation as well as ankle reflex pulses were 1+      Assessment & Plan:   1. Diabetes mellitus  POCT HgB A1C, POCT UA - Microalbumin  2. HTN (hypertension), benign    3. Complete heart block    4. Obesity (BMI 30-39.9)    5. Pacemaker    6. Hyperlipidemia LDL goal < 70    Amaryl  was renewed. Encouraged him to always bring his medications in with him. He also asked for a handicap sticker and I explained that he needs to be as active as he possibly can. I recommended daily exercise.

## 2011-10-01 NOTE — Patient Instructions (Signed)
Stay on the present medicines

## 2011-10-28 ENCOUNTER — Telehealth: Payer: Self-pay | Admitting: Internal Medicine

## 2011-10-28 MED ORDER — PRAVASTATIN SODIUM 80 MG PO TABS: 80.0000 mg | ORAL_TABLET | Freq: Every day | ORAL | Status: DC

## 2011-10-28 NOTE — Telephone Encounter (Signed)
Sent med in 

## 2011-11-21 ENCOUNTER — Encounter: Payer: Self-pay | Admitting: Internal Medicine

## 2011-11-21 ENCOUNTER — Ambulatory Visit (INDEPENDENT_AMBULATORY_CARE_PROVIDER_SITE_OTHER): Payer: Medicare Other | Admitting: *Deleted

## 2011-11-21 DIAGNOSIS — I442 Atrioventricular block, complete: Secondary | ICD-10-CM

## 2011-11-22 LAB — REMOTE PACEMAKER DEVICE
ATRIAL PACING PM: 41.93
BRDY-0002RA: 60 {beats}/min
BRDY-0003RA: 130 {beats}/min
RV LEAD IMPEDENCE PM: 672 Ohm

## 2011-12-02 NOTE — Progress Notes (Signed)
Remote pacer check  

## 2012-01-19 ENCOUNTER — Other Ambulatory Visit: Payer: Self-pay | Admitting: Medical

## 2012-01-20 ENCOUNTER — Telehealth: Payer: Self-pay | Admitting: Internal Medicine

## 2012-01-20 NOTE — Telephone Encounter (Signed)
error 

## 2012-01-28 ENCOUNTER — Encounter: Payer: Self-pay | Admitting: Family Medicine

## 2012-01-28 ENCOUNTER — Ambulatory Visit (INDEPENDENT_AMBULATORY_CARE_PROVIDER_SITE_OTHER): Payer: Medicare Other | Admitting: Family Medicine

## 2012-01-28 VITALS — BP 126/80 | HR 62 | Wt 231.0 lb

## 2012-01-28 DIAGNOSIS — E669 Obesity, unspecified: Secondary | ICD-10-CM

## 2012-01-28 DIAGNOSIS — I5032 Chronic diastolic (congestive) heart failure: Secondary | ICD-10-CM

## 2012-01-28 DIAGNOSIS — I10 Essential (primary) hypertension: Secondary | ICD-10-CM

## 2012-01-28 DIAGNOSIS — E119 Type 2 diabetes mellitus without complications: Secondary | ICD-10-CM

## 2012-01-28 NOTE — Patient Instructions (Signed)
Increase your activities to do something every day.

## 2012-01-28 NOTE — Progress Notes (Signed)
  Subjective:    Patient ID: Malik Rojas, male    DOB: 14-Mar-1932, 78 y.o.   MRN: [id number]  HPI He is here for a diabetes recheck. He remains relatively inactive. He does check his blood sugars and states they run in the 140 range. He had an eye exam done in January. He does occasionally check his feet. His had no shortness of breath, chest pain, DOE, PND. Review of the weighted indicates he has lost a few pounds. He has no particular concerns or complaints. Smoking and drinking were reviewed.   Review of Systems     Objective:   Physical Exam Alert and in no distress. Globin A1c 6.5 which is down from 6.9.      Assessment & Plan:   1. Diabetes mellitus  HgB A1c  2. HTN (hypertension), benign    3. Obesity (BMI 30-39.9)    4. Chronic diastolic heart failure     I again encouraged him to become more physically active but to within the range of comfort level in regard to becoming short of breath. Recheck here in several months

## 2012-02-13 ENCOUNTER — Other Ambulatory Visit: Payer: Self-pay

## 2012-02-13 MED ORDER — SITAGLIPTIN PHOS-METFORMIN HCL 50-1000 MG PO TABS: 1.0000 | ORAL_TABLET | Freq: Two times a day (BID) | ORAL | Status: DC

## 2012-02-13 NOTE — Telephone Encounter (Signed)
Refilled janumet

## 2012-02-14 ENCOUNTER — Encounter: Payer: Self-pay | Admitting: *Deleted

## 2012-02-18 ENCOUNTER — Other Ambulatory Visit: Payer: Self-pay | Admitting: Medical

## 2012-02-21 ENCOUNTER — Ambulatory Visit (INDEPENDENT_AMBULATORY_CARE_PROVIDER_SITE_OTHER): Payer: Medicare Other | Admitting: Internal Medicine

## 2012-02-21 ENCOUNTER — Encounter: Payer: Self-pay | Admitting: Internal Medicine

## 2012-02-21 VITALS — BP 132/68 | HR 73 | Ht 70.0 in | Wt 232.0 lb

## 2012-02-21 DIAGNOSIS — I10 Essential (primary) hypertension: Secondary | ICD-10-CM

## 2012-02-21 DIAGNOSIS — I5032 Chronic diastolic (congestive) heart failure: Secondary | ICD-10-CM

## 2012-02-21 DIAGNOSIS — Z950 Presence of cardiac pacemaker: Secondary | ICD-10-CM

## 2012-02-21 DIAGNOSIS — F341 Dysthymic disorder: Secondary | ICD-10-CM

## 2012-02-21 DIAGNOSIS — R609 Edema, unspecified: Secondary | ICD-10-CM

## 2012-02-21 DIAGNOSIS — I442 Atrioventricular block, complete: Secondary | ICD-10-CM

## 2012-02-21 LAB — PACEMAKER DEVICE OBSERVATION
AL AMPLITUDE: 3.2 mv
AL IMPEDENCE PM: 552 Ohm
AL THRESHOLD: 2 V
RV LEAD IMPEDENCE PM: 696 Ohm
RV LEAD THRESHOLD: 0.5 V

## 2012-02-21 NOTE — Assessment & Plan Note (Signed)
Well-controlled on current meds 

## 2012-02-21 NOTE — Patient Instructions (Addendum)
Remote monitoring is used to monitor your Pacemaker of ICD from home. This monitoring reduces the number of office visits required to check your device to one time per year. It allows [place] to keep an eye on the functioning of your device to ensure it is working properly. You are scheduled for a device check from home on 03/23/12. You may send your transmission at any time that day. If you have a wireless device, the transmission will be sent automatically. After your physician reviews your transmission, you will receive a postcard with your next transmission date.  Your physician recommends that you continue on your current medications as directed. Please refer to the Current Medication list given to you today.

## 2012-02-21 NOTE — Assessment & Plan Note (Signed)
And mildly fluid overload. We will have him increase his Lasix on an as-needed basis if his swelling becomes more problematic;

## 2012-02-21 NOTE — Progress Notes (Signed)
  HPI  Malik Rojas is a 78 y.o. male seen in followup for high-grade heart block and presyncope and is status post pacemaker implantation.   He has history of chronic venus insufficiency  The patient denies chest pain, shortness of breath, nocturnal dyspnea, or peripheral edema.  There have been no palpitations, lightheadedness or syncope. He describes orhtopnea that improves within 5 min even if he stays flat  Past Medical History  Diagnosis Date  . Complete heart block     Status post Medtronic pacemaker  . Diabetes mellitus   . HTN (hypertension)   . OSA (obstructive sleep apnea)   . Edema   . Obesity   . Chronic diastolic heart failure     a. cath 12/05: no CAD, EF 65%;  b. echo 5/11: Ef 60%, mild LVH, mild-mod mitral annular calcification, mild LAE, mild TR, PASP 48  . HLD (hyperlipidemia)   . CKD (chronic kidney disease)     Past Surgical History  Procedure Date  . Pacemaker insertion     medtronic EnRhythm  . Appendectomy     Current Outpatient Prescriptions  Medication Sig Dispense Refill  . aspirin 81 MG tablet Take 81 mg by mouth daily.      . furosemide (LASIX) 80 MG tablet Take 80 mg by mouth 2 (two) times daily.       Marland Kitchen glimepiride (AMARYL) 1 MG tablet Take 1 tablet (1 mg total) by mouth 2 (two) times daily.  60 tablet  5  . labetalol (NORMODYNE) 300 MG tablet TAKE ONE TABLET BY MOUTH TWICE DAILY  60 tablet  4  . lisinopril-hydrochlorothiazide (PRINZIDE,ZESTORETIC) 20-12.5 MG per tablet Take 1 tablet by mouth daily.  30 tablet  prn  . Omega-3 Fatty Acids (FISH OIL) 1000 MG CAPS Take 1 capsule by mouth daily.        . pravastatin (PRAVACHOL) 80 MG tablet Take 1 tablet (80 mg total) by mouth daily.  30 tablet  5  . sitaGLIPtan-metformin (JANUMET) 50-1000 MG per tablet Take 1 tablet by mouth 2 (two) times daily with a meal.  60 tablet  2    No Known Allergies  Review of Systems negative except from HPI and PMH  Physical Exam BP 132/68  Pulse 73  Ht 5'  10" (1.778 m)  Wt 232 lb (105.235 kg)  BMI 33.29 kg/m2 Well developed and well nourished in no acute distress HENT normal E scleral and icterus clear Neck Supple JVP flat; carotids brisk and full Clear to ausculation Regular rate and rhythm, no murmurs gallops or rub Soft with active bowel sounds No clubbing cyanosis Trace Edema Alert and oriented, grossly normal motor and sensory function Skin Warm and Dry  Electrocardiogram demonstrates AV pacing  assessment and  Plan

## 2012-02-21 NOTE — Assessment & Plan Note (Addendum)
The patient's device was interrogated.  The information was reviewed. No changes were made in the programming.  His device is approaching ERI in we'll change his transtelephonic checks 2 monthly

## 2012-02-21 NOTE — Assessment & Plan Note (Signed)
Stable post pacing 

## 2012-02-21 NOTE — Assessment & Plan Note (Signed)
As above.

## 2012-03-23 ENCOUNTER — Encounter: Payer: Self-pay | Admitting: Internal Medicine

## 2012-03-23 ENCOUNTER — Ambulatory Visit (INDEPENDENT_AMBULATORY_CARE_PROVIDER_SITE_OTHER): Payer: Medicare Other | Admitting: *Deleted

## 2012-03-23 DIAGNOSIS — I442 Atrioventricular block, complete: Secondary | ICD-10-CM

## 2012-03-27 LAB — REMOTE PACEMAKER DEVICE
AL IMPEDENCE PM: 536 Ohm
ATRIAL PACING PM: 36.06
BAMS-0001: 170 {beats}/min
BATTERY VOLTAGE: 2.88 V
BRDY-0003RA: 130 {beats}/min
RV LEAD IMPEDENCE PM: 656 Ohm
VENTRICULAR PACING PM: 99.96

## 2012-04-04 ENCOUNTER — Other Ambulatory Visit: Payer: Self-pay | Admitting: Family Medicine

## 2012-04-16 ENCOUNTER — Encounter: Payer: Self-pay | Admitting: *Deleted

## 2012-04-27 ENCOUNTER — Encounter: Payer: Self-pay | Admitting: Internal Medicine

## 2012-04-27 ENCOUNTER — Ambulatory Visit (INDEPENDENT_AMBULATORY_CARE_PROVIDER_SITE_OTHER): Payer: Medicare Other | Admitting: *Deleted

## 2012-04-27 DIAGNOSIS — I442 Atrioventricular block, complete: Secondary | ICD-10-CM

## 2012-04-29 ENCOUNTER — Telehealth: Payer: Self-pay | Admitting: Medical

## 2012-04-29 MED ORDER — PRAVASTATIN SODIUM 80 MG PO TABS: 80.0000 mg | ORAL_TABLET | Freq: Every day | ORAL | Status: DC

## 2012-04-29 NOTE — Telephone Encounter (Signed)
RX WAS SENT INTO THE PHARMACY FOR 30 DAYS DUE TO THE FACT THE PATIENT NEEDS A OFFICE VISIT. CLS

## 2012-05-04 LAB — REMOTE PACEMAKER DEVICE
AL IMPEDENCE PM: 536 Ohm
BAMS-0001: 170 {beats}/min
BATTERY VOLTAGE: 2.88 V

## 2012-05-05 NOTE — Progress Notes (Signed)
Remote pacer check  

## 2012-05-15 ENCOUNTER — Telehealth: Payer: Self-pay | Admitting: Internal Medicine

## 2012-05-15 MED ORDER — SITAGLIPTIN PHOS-METFORMIN HCL 50-1000 MG PO TABS: 1.0000 | ORAL_TABLET | Freq: Two times a day (BID) | ORAL | Status: DC

## 2012-05-15 NOTE — Telephone Encounter (Signed)
Sent med in 

## 2012-05-19 ENCOUNTER — Encounter: Payer: Self-pay | Admitting: *Deleted

## 2012-05-28 ENCOUNTER — Ambulatory Visit (INDEPENDENT_AMBULATORY_CARE_PROVIDER_SITE_OTHER): Payer: Medicare Other | Admitting: Family Medicine

## 2012-05-28 ENCOUNTER — Encounter: Payer: Self-pay | Admitting: Family Medicine

## 2012-05-28 VITALS — BP 126/80 | HR 62 | Wt 229.0 lb

## 2012-05-28 DIAGNOSIS — E669 Obesity, unspecified: Secondary | ICD-10-CM

## 2012-05-28 DIAGNOSIS — E785 Hyperlipidemia, unspecified: Secondary | ICD-10-CM

## 2012-05-28 DIAGNOSIS — R609 Edema, unspecified: Secondary | ICD-10-CM

## 2012-05-28 DIAGNOSIS — Z79899 Other long term (current) drug therapy: Secondary | ICD-10-CM

## 2012-05-28 DIAGNOSIS — E119 Type 2 diabetes mellitus without complications: Secondary | ICD-10-CM

## 2012-05-28 DIAGNOSIS — I10 Essential (primary) hypertension: Secondary | ICD-10-CM

## 2012-05-28 LAB — CBC WITH DIFFERENTIAL/PLATELET
Basophils Absolute: 0 10*3/uL (ref 0.0–0.1)
Eosinophils Absolute: 0.1 10*3/uL (ref 0.0–0.7)
Eosinophils Relative: 2 % (ref 0–5)
Lymphs Abs: 1.5 10*3/uL (ref 0.7–4.0)
MCH: 30.2 pg (ref 26.0–34.0)
Neutrophils Relative %: 69 % (ref 43–77)
Platelets: 217 10*3/uL (ref 150–400)
RBC: 4.96 MIL/uL (ref 4.22–5.81)
RDW: 14.5 % (ref 11.5–15.5)
WBC: 7.8 10*3/uL (ref 4.0–10.5)

## 2012-05-28 LAB — COMPREHENSIVE METABOLIC PANEL
ALT: 14 U/L (ref 0–53)
AST: 17 U/L (ref 0–37)
Alkaline Phosphatase: 65 U/L (ref 39–117)
CO2: 27 mEq/L (ref 19–32)
Creat: 1.32 mg/dL (ref 0.50–1.35)
Sodium: 141 mEq/L (ref 135–145)
Total Bilirubin: 1.1 mg/dL (ref 0.3–1.2)
Total Protein: 7.4 g/dL (ref 6.0–8.3)

## 2012-05-28 LAB — POCT UA - MICROALBUMIN: Microalbumin Ur, POC: >300 mg/dL

## 2012-05-28 LAB — LIPID PANEL
LDL Cholesterol: 73 mg/dL (ref 0–99)
Triglycerides: 147 mg/dL (ref <150)

## 2012-05-28 NOTE — Progress Notes (Signed)
  Subjective:    Patient ID: Malik Rojas, male    DOB: 09/08/1931, 78 y.o.   MRN: [id number]  HPI He is here for recheck. He apparently is getting his pacemaker changed. He states that he needs new wiring. He has been keeping track of his weights and there is a slight decrease. He continues on medications listed in the chart. He does not smoke or drink. His wife is here with him. He will be taking a bus trip in the near future and is concerned about taking diuretics. He continues to have lower extremity swelling but this does seem to be under better control. He recently saw his podiatrist in several toenails were removed that were apparently mechanically giving him difficulty. He has no chest pain, shortness of breath, DOE. He has no other complaints or concerns.   Review of Systems     Objective:   Physical Exam Alert and in no distress. Both lower extremities show edema as well as chronic vascular changes with pigmentation however the skin is normal in temperature. Hemoglobin A1c is 6.7.       Assessment & Plan:   1. Diabetes mellitus  POCT glycosylated hemoglobin (Hb A1C), POCT UA - Microalbumin  2. Obesity (BMI 30-39.9)    3. Hyperlipidemia LDL goal < 70  Lipid panel  4. HTN (hypertension), benign    5. Edema    6. Encounter for long-term (current) use of other medications  CBC with Differential, Comprehensive metabolic panel   recommend he hold the diuretics on the day that he has a bus trip. Explained that this in one would not make that much difference. Recheck here in several months.

## 2012-05-28 NOTE — Patient Instructions (Signed)
On the day that you take the bus trip don't take the lisinopril/HCTZ also don't take the furosemide.

## 2012-05-29 ENCOUNTER — Telehealth: Payer: Self-pay | Admitting: Internal Medicine

## 2012-05-29 MED ORDER — PRAVASTATIN SODIUM 80 MG PO TABS: 80.0000 mg | ORAL_TABLET | Freq: Every day | ORAL | Status: DC

## 2012-05-29 NOTE — Telephone Encounter (Signed)
RX WAS SENT TO THE PHARMACY. CLS 

## 2012-06-01 ENCOUNTER — Ambulatory Visit (INDEPENDENT_AMBULATORY_CARE_PROVIDER_SITE_OTHER): Payer: Medicare Other | Admitting: *Deleted

## 2012-06-01 DIAGNOSIS — I442 Atrioventricular block, complete: Secondary | ICD-10-CM

## 2012-06-09 LAB — REMOTE PACEMAKER DEVICE
ATRIAL PACING PM: 50.72
BAMS-0001: 170 {beats}/min
BRDY-0002RA: 60 {beats}/min
BRDY-0003RA: 130 {beats}/min
BRDY-0004RA: 130 {beats}/min

## 2012-07-07 ENCOUNTER — Encounter: Payer: Self-pay | Admitting: *Deleted

## 2012-07-10 ENCOUNTER — Ambulatory Visit (INDEPENDENT_AMBULATORY_CARE_PROVIDER_SITE_OTHER): Payer: Medicare Other | Admitting: Family Medicine

## 2012-07-10 ENCOUNTER — Encounter: Payer: Self-pay | Admitting: Family Medicine

## 2012-07-10 VITALS — BP 170/80 | HR 76 | Wt 232.0 lb

## 2012-07-10 DIAGNOSIS — N3941 Urge incontinence: Secondary | ICD-10-CM

## 2012-07-10 MED ORDER — TAMSULOSIN HCL 0.4 MG PO CAPS: 0.4000 mg | ORAL_CAPSULE | Freq: Every day | ORAL | Status: DC

## 2012-07-10 NOTE — Progress Notes (Signed)
  Subjective:    Patient ID: Malik Rojas, male    DOB: 1931-09-13, 78 y.o.   MRN: [id number]  HPI He is here for evaluation of a six-month history of difficulty with urinary incontinence. He states that he has the urge to urinate and then before he can get to the bathroom, he will become incontinent. This is now interfering with the quality of his life. He states he has no problems getting his urine started or imaging his bladder. His stream is normal. He does complain of some testicular swelling and discomfort.   Review of Systems     Objective:   Physical Exam Penis is slightly swollen on the left near the glans. Both vas deferens do seem to be swollen. Prostate is shows it to be slightly smaller than I would expect but not tender. Urine microscopic showed no red cells white cells or bacteria       Assessment & Plan:   1. Urge incontinence  Tamsulosin HCl (FLOMAX) 0.4 MG CAPS, Ambulatory referral to Urology   since he is having difficulty with swelling in the scrotal/basilar area, would like to get further evaluation and verification of the urge incontinence

## 2012-07-13 ENCOUNTER — Encounter: Payer: Self-pay | Admitting: Internal Medicine

## 2012-07-27 ENCOUNTER — Telehealth: Payer: Self-pay | Admitting: Internal Medicine

## 2012-07-27 MED ORDER — LABETALOL HCL 300 MG PO TABS: 300.0000 mg | ORAL_TABLET | Freq: Two times a day (BID) | ORAL | Status: DC

## 2012-07-27 NOTE — Telephone Encounter (Signed)
Sent med in 

## 2012-09-07 ENCOUNTER — Ambulatory Visit (INDEPENDENT_AMBULATORY_CARE_PROVIDER_SITE_OTHER): Payer: Medicare Other | Admitting: *Deleted

## 2012-09-07 ENCOUNTER — Other Ambulatory Visit: Payer: Self-pay

## 2012-09-07 DIAGNOSIS — I442 Atrioventricular block, complete: Secondary | ICD-10-CM

## 2012-09-07 DIAGNOSIS — Z950 Presence of cardiac pacemaker: Secondary | ICD-10-CM

## 2012-09-08 ENCOUNTER — Other Ambulatory Visit: Payer: Self-pay | Admitting: Family Medicine

## 2012-09-15 LAB — REMOTE PACEMAKER DEVICE
AL AMPLITUDE: 3 mv
BATTERY VOLTAGE: 2.85 V
BRDY-0003RA: 130 {beats}/min
BRDY-0004RA: 130 {beats}/min
VENTRICULAR PACING PM: 99.97

## 2012-09-28 ENCOUNTER — Ambulatory Visit
Admission: RE | Admit: 2012-09-28 | Discharge: 2012-09-28 | Disposition: A | Discharge status: Home / Self Care | Payer: Medicare Other | Source: Ambulatory Visit | Attending: Family Medicine | Admitting: Family Medicine

## 2012-09-28 ENCOUNTER — Ambulatory Visit (INDEPENDENT_AMBULATORY_CARE_PROVIDER_SITE_OTHER): Payer: Medicare Other | Admitting: Family Medicine

## 2012-09-28 ENCOUNTER — Encounter: Payer: Self-pay | Admitting: Family Medicine

## 2012-09-28 VITALS — BP 142/90 | HR 70

## 2012-09-28 LAB — COMPREHENSIVE METABOLIC PANEL
ALT: 10 U/L (ref 0–53)
AST: 13 U/L (ref 0–37)
Albumin: 4.5 g/dL (ref 3.5–5.2)
Alkaline Phosphatase: 67 U/L (ref 39–117)
BUN: 13 mg/dL (ref 6–23)
CO2: 27 mEq/L (ref 19–32)
Calcium: 9.4 mg/dL (ref 8.4–10.5)
Chloride: 104 mEq/L (ref 96–112)
Creat: 1.23 mg/dL (ref 0.50–1.35)
Glucose, Bld: 131 mg/dL — ABNORMAL HIGH (ref 70–99)
Potassium: 4.7 mEq/L (ref 3.5–5.3)
Sodium: 141 mEq/L (ref 135–145)
Total Bilirubin: 1.1 mg/dL (ref 0.3–1.2)
Total Protein: 7.2 g/dL (ref 6.0–8.3)

## 2012-09-28 LAB — CBC WITH DIFFERENTIAL/PLATELET
Basophils Absolute: 0.1 10*3/uL (ref 0.0–0.1)
Basophils Relative: 1 % (ref 0–1)
Eosinophils Absolute: 0.2 10*3/uL (ref 0.0–0.7)
Eosinophils Relative: 3 % (ref 0–5)
HCT: 42.7 % (ref 39.0–52.0)
Hemoglobin: 14.4 g/dL (ref 13.0–17.0)
Lymphocytes Relative: 17 % (ref 12–46)
Lymphs Abs: 1.7 10*3/uL (ref 0.7–4.0)
MCH: 29.9 pg (ref 26.0–34.0)
MCHC: 33.7 g/dL (ref 30.0–36.0)
MCV: 88.6 fL (ref 78.0–100.0)
Monocytes Absolute: 0.8 10*3/uL (ref 0.1–1.0)
Monocytes Relative: 8 % (ref 3–12)
Neutro Abs: 7 10*3/uL (ref 1.7–7.7)
Neutrophils Relative %: 71 % (ref 43–77)
Platelets: 204 10*3/uL (ref 150–400)
RBC: 4.82 MIL/uL (ref 4.22–5.81)
RDW: 14.4 % (ref 11.5–15.5)
WBC: 9.8 10*3/uL (ref 4.0–10.5)

## 2012-09-28 MED ORDER — TAMSULOSIN HCL 0.4 MG PO CAPS: 0.4000 mg | ORAL_CAPSULE | Freq: Two times a day (BID) | ORAL | Status: DC

## 2012-09-28 NOTE — Progress Notes (Signed)
  Subjective:    Patient ID: Malik Rojas, male    DOB: [birth year], 79 y.o.   MRN: [id number]  HPI He is here for a recheck. His wife is here with him. He does get routine followup concerning his pacer. He is being followed by urology for his urinary incontinence. He is scheduled to see them again in several weeks. He does complain of a several month history of DOE. His wife states that he is no longer to go out for walks with her and the dog. He does sleep in a recliner due to difficulty breathing but this has been the case for at least 3 years. He does not complain of chest pain.He continues to have swelling in both his lower extremities but states that this is doing better. He continues on medications listed in the chart. Blood work was done in October. Hemoglobin A1c is 6.6  Review of Systems     Objective:   Physical Exam Alert and in no distress. Cardiac exam shows a regular rhythm. Lungs clear to auscultation. Exam of his lower extremities shows continued difficulty with swelling however in the skin is warm and cool.       Assessment & Plan:  Diabetes mellitus - Plan: POCT glycosylated hemoglobin (Hb A1C)  Complete heart block - Plan: DG Chest 2 View  Edema - Plan: DG Chest 2 View  HTN (hypertension), benign  Encounter for long-term (current) use of other medications - Plan: CBC with Differential, Comprehensive metabolic panel  DOE (dyspnea on exertion) - Plan: DG Chest 2 View, Brain natriuretic peptide  I will refer him back to his cardiologist for reevaluation. Discussed Flomax and recommend he discuss this with his urologist. I explained that urologist might want to change to a different medication regimen. No particular therapy for the leg edema as it is really unchanged.continue on other medications.

## 2012-09-29 NOTE — Progress Notes (Signed)
Quick Note:  CALLED HOME # TALKED TO WIFE SHE WAS INFORMED AND SHE SAID HE IS GOING TO CARDIOLOGIST ON FEB 27 ______

## 2012-09-30 ENCOUNTER — Telehealth: Payer: Self-pay | Admitting: Internal Medicine

## 2012-09-30 MED ORDER — TAMSULOSIN HCL 0.4 MG PO CAPS: 0.4000 mg | ORAL_CAPSULE | Freq: Two times a day (BID) | ORAL | Status: DC

## 2012-09-30 NOTE — Telephone Encounter (Signed)
Sent in # 60 per fax

## 2012-10-01 ENCOUNTER — Encounter: Payer: Self-pay | Admitting: Physician Assistant

## 2012-10-01 ENCOUNTER — Ambulatory Visit (INDEPENDENT_AMBULATORY_CARE_PROVIDER_SITE_OTHER): Payer: Medicare Other | Admitting: Physician Assistant

## 2012-10-01 VITALS — BP 138/80 | HR 61 | Ht 70.0 in | Wt 238.0 lb

## 2012-10-01 DIAGNOSIS — I10 Essential (primary) hypertension: Secondary | ICD-10-CM

## 2012-10-01 DIAGNOSIS — R0602 Shortness of breath: Secondary | ICD-10-CM

## 2012-10-01 NOTE — Progress Notes (Signed)
[address], [address] [place], [place]  [address] Phone: [phone], Fax:  [phone]  Date:  10/01/2012   ID:  Malik Rojas, DOB 1931/11/16, MRN [id number]  PCP:  Carollee Herter, MD  Primary Cardiologist/Electrophysiologist:  Dr. Sherryl Manges     History of Present Illness: Malik Rojas is a 79 y.o. male who returns for evaluation of dyspnea.  He has a hx of HTN, DM2, HL, diastolic CHF, high grade HB, s/p pacer.  Over the last 3 mos, he has noted increased DOE.  He is NYHA Class III.  Sleeps in a recliner chronically.  No PND, edema.  No chest pain.  No syncope.  Pacer interrogated earlier this month and functioning appropriately.  Denies salt indiscretion.    Labs (2/14):  K 4.7, creatinine 1.23, ALT 10, BNP 142, Hgb 14.4, A1c 6.6. CXR 2/14:  No acute findings.  Wt Readings from Last 3 Encounters:  07/10/12 232 lb (105.235 kg)  05/28/12 229 lb (103.874 kg)  02/21/12 232 lb (105.235 kg)     Past Medical History  Diagnosis Date  . Complete heart block     Status post Medtronic pacemaker  . Diabetes mellitus   . HTN (hypertension)   . OSA (obstructive sleep apnea)   . Edema   . Obesity   . Chronic diastolic heart failure     a. cath 12/05: no CAD, EF 65%;  b. echo 5/11: Ef 60%, mild LVH, mild-mod mitral annular calcification, mild LAE, mild TR, PASP 48  . HLD (hyperlipidemia)   . CKD (chronic kidney disease)     Current Outpatient Prescriptions  Medication Sig Dispense Refill  . aspirin 81 MG tablet Take 81 mg by mouth daily.      . furosemide (LASIX) 80 MG tablet Take 80 mg by mouth 2 (two) times daily.       Marland Kitchen glimepiride (AMARYL) 1 MG tablet TAKE ONE TABLET BY MOUTH TWICE DAILY  60 tablet  0  . labetalol (NORMODYNE) 300 MG tablet Take 1 tablet (300 mg total) by mouth 2 (two) times daily.  60 tablet  4  . lisinopril-hydrochlorothiazide (PRINZIDE,ZESTORETIC) 20-12.5 MG per tablet Take 1 tablet by mouth daily.  30 tablet  prn  . Omega-3  Fatty Acids (FISH OIL) 1000 MG CAPS Take 1 capsule by mouth daily.        . pravastatin (PRAVACHOL) 80 MG tablet Take 1 tablet (80 mg total) by mouth daily. PATIENT NEEDS A OFFICE VISIT BEFORE HIS MEDICATION RUNS OUT.  30 tablet  5  . sitaGLIPtan-metformin (JANUMET) 50-1000 MG per tablet Take 1 tablet by mouth 2 (two) times daily with a meal.  60 tablet  2  . tamsulosin (FLOMAX) 0.4 MG CAPS Take 1 capsule (0.4 mg total) by mouth 2 (two) times daily.  60 capsule  0   No current facility-administered medications for this visit.    Allergies:   No Known Allergies  Social History:  The patient  reports that he quit smoking about 42 years ago. He has never used smokeless tobacco. He reports that  drinks alcohol. He reports that he does not use illicit drugs.   ROS:  Please see the history of present illness.   He has occasional diarrhea.   All other systems reviewed and negative.   PHYSICAL EXAM: VS:  BP 138/80  Pulse 61  Ht 5\' 10"  (1.778 m)  Wt 238 lb (107.956 kg)  BMI 34.15 kg/m2 Well nourished, well developed, in no  acute distress HEENT: normal Neck: minimally elevated JVD Cardiac:  normal S1, S2; RRR; no murmur Lungs:  clear to auscultation bilaterally, no wheezing, rhonchi or rales Abd: soft, nontender, no hepatomegaly Ext: 2+ bilateral LE edema with chronic changes Skin: warm and dry Neuro:  CNs 2-12 intact, no focal abnormalities noted  EKG:  AV paced, HR 61     ASSESSMENT AND PLAN:  1. Dyspnea:  I suspect this is all volume overload.  It has been 9 years since his LHC.  Likelihood he has developed significant CAD is low.  However, he has noted reduced exercise tolerance.  DOE for him is unusual.  His exam is not that remarkable for CHF.  BNP is up slightly.  Will increase Lasix to 120 mg in A and 80 mg in P x 3 days.  Resume usual dose after this.  Check BMET in 1 week.  Schedule Lexiscan Myoview.  Plan close follow up. 2. Chronic Diastolic CHF:  Increase Lasix as noted.    3. Complete Heart Block, s/p Pacer:  Pacemaker functioning appropriately on recent remote check. 4. Hypertension:  Controlled.  Continue current therapy.  5. Disposition:  Follow up with me in 2 weeks.   Luna Glasgow, PA-C  10:58 AM 10/01/2012

## 2012-10-01 NOTE — Patient Instructions (Addendum)
Your physician has requested that you have a lexiscan myoview dx 786.50. For further information please visit [email or web address] Please follow instruction sheet, as given.  Your physician recommends that you return for lab work in: 1 week can be done the same day as myoview  Your physician has recommended you make the following change in your medication: CHANGE LASIX TO 120 MG IN THE MORNING AND 8 MG IN THE PM FOR 3 DAYS THEN RESUME 80 MG TWICE DAILY  Your physician recommends that you schedule a follow-up appointment in: 2 WEEKS WITH SCOTT WEAVER, PAC SAME DAY DR. Graciela Husbands IS IN THE OFFICE AND ALSO SCHEDULE PT TO HAVE DEVICE CHECK THE SAME DAY

## 2012-10-08 ENCOUNTER — Encounter (HOSPITAL_COMMUNITY): Payer: Self-pay | Admitting: Emergency Medicine

## 2012-10-08 ENCOUNTER — Emergency Department (HOSPITAL_COMMUNITY)
Admission: EM | Admit: 2012-10-08 | Discharge: 2012-10-08 | Disposition: A | Discharge status: Home / Self Care | Payer: Medicare Other | Source: Home / Self Care | Attending: Family Medicine | Admitting: Family Medicine

## 2012-10-08 ENCOUNTER — Telehealth: Payer: Self-pay

## 2012-10-08 ENCOUNTER — Ambulatory Visit (HOSPITAL_COMMUNITY): Payer: Medicare Other | Attending: Physician Assistant | Admitting: Radiology

## 2012-10-08 ENCOUNTER — Other Ambulatory Visit (INDEPENDENT_AMBULATORY_CARE_PROVIDER_SITE_OTHER): Payer: Medicare Other

## 2012-10-08 VITALS — BP 183/86 | Ht 70.0 in | Wt 236.0 lb

## 2012-10-08 DIAGNOSIS — R0602 Shortness of breath: Secondary | ICD-10-CM

## 2012-10-08 DIAGNOSIS — I5032 Chronic diastolic (congestive) heart failure: Secondary | ICD-10-CM | POA: Insufficient documentation

## 2012-10-08 DIAGNOSIS — I2510 Atherosclerotic heart disease of native coronary artery without angina pectoris: Secondary | ICD-10-CM

## 2012-10-08 DIAGNOSIS — E119 Type 2 diabetes mellitus without complications: Secondary | ICD-10-CM | POA: Insufficient documentation

## 2012-10-08 DIAGNOSIS — I10 Essential (primary) hypertension: Secondary | ICD-10-CM

## 2012-10-08 LAB — BASIC METABOLIC PANEL
BUN: 12 mg/dL (ref 6–23)
CO2: 27 mEq/L (ref 19–32)
Chloride: 100 mEq/L (ref 96–112)
Creatinine, Ser: 1.2 mg/dL (ref 0.4–1.5)
Glucose, Bld: 120 mg/dL — ABNORMAL HIGH (ref 70–99)

## 2012-10-08 LAB — POCT I-STAT, CHEM 8
BUN: 13 mg/dL (ref 6–23)
Calcium, Ion: 1.19 mmol/L (ref 1.13–1.30)
Creatinine, Ser: 1.4 mg/dL — ABNORMAL HIGH (ref 0.50–1.35)
Glucose, Bld: 164 mg/dL — ABNORMAL HIGH (ref 70–99)
TCO2: 33 mmol/L (ref 0–100)

## 2012-10-08 MED ORDER — TECHNETIUM TC 99M SESTAMIBI GENERIC - CARDIOLITE
11.0000 | Freq: Once | INTRAVENOUS | Status: AC | PRN
  Administered 2012-10-08: 11 via INTRAVENOUS

## 2012-10-08 MED ORDER — ADENOSINE (DIAGNOSTIC) 3 MG/ML IV SOLN
0.5600 mg/kg | Freq: Once | INTRAVENOUS | Status: AC
  Administered 2012-10-08: 60 mg via INTRAVENOUS

## 2012-10-08 MED ORDER — TECHNETIUM TC 99M SESTAMIBI GENERIC - CARDIOLITE
33.0000 | Freq: Once | INTRAVENOUS | Status: AC | PRN
  Administered 2012-10-08: 33 via INTRAVENOUS

## 2012-10-08 NOTE — Progress Notes (Signed)
Malik Rojas, Malik Rojas is a 79 y.o. male     MRN : [id number]     DOB: [birth year]  Procedure Date: 10/08/2012  Nuclear Med Background Indication for Stress Test:  Evaluation for Ischemia History:  5/11 Echo EF 60%, 2005 Heart Catheterization EF 65% Cardiac Risk Factors: History of Smoking, Hypertension, NIDDM and Obesity  Symptoms:  DOE, Fatigue, Light-Headedness and SOB   Nuclear Pre-Procedure Caffeine/Decaff Intake:  None NPO After: 5 pm   Lungs:  clear O2 Sat: 95% on room air. IV 0.9% NS with Angio Cath:  20g  IV Site: L Hand  IV Started by:  Bonnita Levan, RN  Chest Size (in):  50 + Cup Size: n/a  Height: 5\' 10"  (1.778 m)  Weight:  236 lb (107.049 kg)  BMI:  Body mass index is 33.86 kg/(m^2). Tech Comments:  N/A    Nuclear Med Study 1 or 2 day study: 1 day  Stress Test Type:  Adenosine  Reading MD: Cassell Clement, MD  Order Authorizing Keoki Mchargue:  [name], MD  Resting Radionuclide: Technetium 96m Sestamibi  Resting Radionuclide Dose: 11.0 mCi   Stress Radionuclide:  Technetium 52m Sestamibi  Stress Radionuclide Dose: 33.0 mCi           Stress Protocol Rest HR: 60 Stress HR: 88  Rest BP: 183/86 Stress BP: 183/77  Exercise Time (min): n/a METS: n/a   Predicted Max HR: 142 bpm % Max HR: 61.97 bpm Rate Pressure Product: 21308   Dose of Adenosine (mg):  60 mg Dose of Lexiscan: n/a mg  Dose of Atropine (mg): n/a Dose of Dobutamine: n/a mcg/kg/min (at max HR)  Stress Test Technologist: Bonnita Levan, RN  Nuclear Technologist:  Domenic Polite, CNMT     Rest Procedure:  Myocardial perfusion imaging was performed at rest 45 minutes following the intravenous administration of Technetium 79m Sestamibi. Rest ECG: AV paced with LBBB  Stress Procedure:  The patient received IV adenosine at 140 mcg/kg/min for 4 minutes.  Technetium 98m Sestamibi was  injected at the 2 minute mark and quantitative spect images were obtained after a 45 minute delay.  Stress ECG: No significant change from baseline ECG  QPS Raw Data Images:  Normal; no motion artifact; normal heart/lung ratio. Stress Images:  There is decreased uptake in the inferior wall. Rest Images:  There is decreased uptake in the inferior wall. Subtraction (SDS):  There is a fixed inferior defect that is most consistent with diaphragmatic attenuation. Transient Ischemic Dilatation (Normal <1.22):  1.07 Lung/Heart Ratio (Normal <0.45):  0.34  Quantitative Gated Spect Images QGS EDV:  189 ml QGS ESV:  122 ml  Impression Exercise Capacity:  Adenosine study with no exercise. BP Response:  Normal blood pressure response. Clinical Symptoms:  No symptoms. ECG Impression:  Baseline:  LBBB.  EKG uninterpretable due to LBBB at rest and stress. Comparison with Prior Nuclear Study: No images to compare  Overall Impression:  Low risk stress nuclear study. Decrease in perfusion in apical inferior segment suggestive of old scar.  No significant ischemia. SDS=2  LV Ejection Fraction: 36%.  LV Wall Motion:  inferoapical hypokinesis.  Overall depressed LV systolic function.   Cassell Clement

## 2012-10-08 NOTE — Telephone Encounter (Signed)
Malik Rojas was called re: high K+.  I told him he needs to get it rechecked at the hospital tonight per [facility] PA-C.  I told him it is dangerous to have it this high and it needs to be rechecked to make sure the level is high.  He agrees.  Order was faxed to [facility] lab.

## 2012-10-08 NOTE — ED Provider Notes (Signed)
History     CSN: [id number]  Arrival date & time 10/08/12  1801   First MD Initiated Contact with Patient 10/08/12 1817      No chief complaint on file.   (Consider location/radiation/quality/duration/timing/severity/associated sxs/prior treatment) Patient is a 79 y.o. male presenting with general illness. The history is provided by the patient.  Illness  The current episode started today (told by [facility] that he had a high potassium  and to go to [facility].). The onset was sudden. The problem has been unchanged. The problem is mild.    Past Medical History  Diagnosis Date  . Complete heart block     Status post Medtronic pacemaker  . Diabetes mellitus   . HTN (hypertension)   . OSA (obstructive sleep apnea)   . Edema   . Obesity   . Chronic diastolic heart failure     a. cath 12/05: no CAD, EF 65%;  b. echo 5/11: Ef 60%, mild LVH, mild-mod mitral annular calcification, mild LAE, mild TR, PASP 48  . HLD (hyperlipidemia)   . CKD (chronic kidney disease)     Past Surgical History  Procedure Laterality Date  . Pacemaker insertion      medtronic EnRhythm  . Appendectomy      Family History  Problem Relation Age of Onset  . Leukemia Mother     History  Substance Use Topics  . Smoking status: Former Smoker -- 5.00 packs/day for 12 years    Quit date: 08/05/1970  . Smokeless tobacco: Never Used  . Alcohol Use: Yes     Comment: occasional      Review of Systems  Constitutional: Negative.   Cardiovascular: Negative.     Allergies  Review of patient's allergies indicates no known allergies.  Home Medications   Current Outpatient Rx  Name  Route  Sig  Dispense  Refill  . aspirin 81 MG tablet   Oral   Take 81 mg by mouth daily.         . furosemide (LASIX) 80 MG tablet   Oral   Take 80 mg by mouth 2 (two) times daily.          Marland Kitchen glimepiride (AMARYL) 1 MG tablet      TAKE ONE TABLET BY MOUTH TWICE DAILY   60 tablet   0   . labetalol (NORMODYNE)  300 MG tablet   Oral   Take 1 tablet (300 mg total) by mouth 2 (two) times daily.   60 tablet   4   . lisinopril-hydrochlorothiazide (PRINZIDE,ZESTORETIC) 20-12.5 MG per tablet   Oral   Take 1 tablet by mouth daily.   30 tablet   prn   . Omega-3 Fatty Acids (FISH OIL) 1000 MG CAPS   Oral   Take 1 capsule by mouth daily.           . pravastatin (PRAVACHOL) 80 MG tablet   Oral   Take 1 tablet (80 mg total) by mouth daily. PATIENT NEEDS A OFFICE VISIT BEFORE HIS MEDICATION RUNS OUT.   30 tablet   5   . sitaGLIPtan-metformin (JANUMET) 50-1000 MG per tablet   Oral   Take 1 tablet by mouth 2 (two) times daily with a meal.   60 tablet   2   . tamsulosin (FLOMAX) 0.4 MG CAPS   Oral   Take 1 capsule (0.4 mg total) by mouth 2 (two) times daily.   60 capsule   0     There were  no vitals taken for this visit.  Physical Exam  Nursing note and vitals reviewed. Constitutional: He is oriented to person, place, and time. He appears well-developed and well-nourished.  Cardiovascular: Normal rate, regular rhythm, normal heart sounds and intact distal pulses.   Pulmonary/Chest: Effort normal and breath sounds normal.  Neurological: He is alert and oriented to person, place, and time.    ED Course  Procedures (including critical care time)  Labs Reviewed - No data to display No results found.   No diagnosis found.    MDM          Linna Hoff, MD 10/08/12 [phone]

## 2012-10-08 NOTE — ED Notes (Signed)
Pt states that he is in for repeat blood work. Potassium showed 6.1 hemolyzed.  Pt has no other concerns.

## 2012-10-09 ENCOUNTER — Encounter: Payer: Self-pay | Admitting: Physician Assistant

## 2012-10-09 ENCOUNTER — Telehealth: Payer: Self-pay | Admitting: *Deleted

## 2012-10-09 NOTE — Telephone Encounter (Signed)
pt's wife notified about stress test results and that pt needs echo to re-assess EF. sch for 10/12/12 @ 7:30, wife verbalized understanding; she also said pt had repeat bmet and K+ 3.7 from 6.1 hemolyzed bmet

## 2012-10-09 NOTE — Telephone Encounter (Signed)
Message copied by Tarri Fuller on Fri Oct 09, 2012 11:41 AM ------      Message from: [place], [place] T      Created: Fri Oct 09, 2012 11:02 AM       Stress test with no ischemia but EF down compared to prior      He needs to have an echo done to confirm EF before follow up visit      Please schedule Echo      Tereso Newcomer, PA-C  11:02 AM 10/09/2012 ------

## 2012-10-10 ENCOUNTER — Other Ambulatory Visit: Payer: Self-pay | Admitting: Family Medicine

## 2012-10-12 ENCOUNTER — Ambulatory Visit (HOSPITAL_COMMUNITY): Payer: Medicare Other | Attending: Internal Medicine | Admitting: Radiology

## 2012-10-12 ENCOUNTER — Encounter: Payer: Self-pay | Admitting: *Deleted

## 2012-10-12 DIAGNOSIS — I509 Heart failure, unspecified: Secondary | ICD-10-CM | POA: Insufficient documentation

## 2012-10-12 DIAGNOSIS — R0609 Other forms of dyspnea: Secondary | ICD-10-CM | POA: Insufficient documentation

## 2012-10-12 DIAGNOSIS — E119 Type 2 diabetes mellitus without complications: Secondary | ICD-10-CM | POA: Insufficient documentation

## 2012-10-12 DIAGNOSIS — Z6834 Body mass index (BMI) 34.0-34.9, adult: Secondary | ICD-10-CM | POA: Insufficient documentation

## 2012-10-12 DIAGNOSIS — R0602 Shortness of breath: Secondary | ICD-10-CM

## 2012-10-12 DIAGNOSIS — E669 Obesity, unspecified: Secondary | ICD-10-CM | POA: Insufficient documentation

## 2012-10-12 DIAGNOSIS — R0989 Other specified symptoms and signs involving the circulatory and respiratory systems: Secondary | ICD-10-CM | POA: Insufficient documentation

## 2012-10-12 DIAGNOSIS — I10 Essential (primary) hypertension: Secondary | ICD-10-CM | POA: Insufficient documentation

## 2012-10-12 NOTE — Progress Notes (Signed)
Echocardiogram performed.  

## 2012-10-13 ENCOUNTER — Encounter: Payer: Self-pay | Admitting: *Deleted

## 2012-10-13 ENCOUNTER — Encounter: Payer: Self-pay | Admitting: Physician Assistant

## 2012-10-13 ENCOUNTER — Telehealth: Payer: Self-pay | Admitting: *Deleted

## 2012-10-13 NOTE — Telephone Encounter (Signed)
Message copied by Tarri Fuller on Tue Oct 13, 2012  2:54 PM ------      Message from: [place], [place] T      Created: Tue Oct 13, 2012  8:25 AM       EF is lower than previous => 35% (previously normal).      Keep follow up tomorrow.  We can discuss further what this means tomorrow.      Tereso Newcomer, PA-C  8:24 AM 10/13/2012 ------

## 2012-10-14 ENCOUNTER — Telehealth: Payer: Self-pay | Admitting: *Deleted

## 2012-10-14 ENCOUNTER — Encounter: Payer: Self-pay | Admitting: Physician Assistant

## 2012-10-14 ENCOUNTER — Encounter: Payer: Self-pay | Admitting: *Deleted

## 2012-10-14 ENCOUNTER — Ambulatory Visit (INDEPENDENT_AMBULATORY_CARE_PROVIDER_SITE_OTHER): Payer: Medicare Other | Admitting: *Deleted

## 2012-10-14 ENCOUNTER — Other Ambulatory Visit: Payer: Self-pay

## 2012-10-14 ENCOUNTER — Ambulatory Visit (INDEPENDENT_AMBULATORY_CARE_PROVIDER_SITE_OTHER): Payer: Medicare Other | Admitting: Physician Assistant

## 2012-10-14 VITALS — BP 140/80 | HR 68 | Wt 234.0 lb

## 2012-10-14 DIAGNOSIS — I5022 Chronic systolic (congestive) heart failure: Secondary | ICD-10-CM

## 2012-10-14 DIAGNOSIS — I429 Cardiomyopathy, unspecified: Secondary | ICD-10-CM

## 2012-10-14 DIAGNOSIS — I442 Atrioventricular block, complete: Secondary | ICD-10-CM

## 2012-10-14 DIAGNOSIS — F341 Dysthymic disorder: Secondary | ICD-10-CM

## 2012-10-14 DIAGNOSIS — E785 Hyperlipidemia, unspecified: Secondary | ICD-10-CM

## 2012-10-14 DIAGNOSIS — I10 Essential (primary) hypertension: Secondary | ICD-10-CM

## 2012-10-14 LAB — PACEMAKER DEVICE OBSERVATION
AL AMPLITUDE: 2.5766 mv
AL IMPEDENCE PM: 512 Ohm
AL THRESHOLD: 2 v
ATRIAL PACING PM: 42.34
BAMS-0001: 170 {beats}/min
BATTERY VOLTAGE: 2.85 v
RV LEAD IMPEDENCE PM: 600 Ohm
RV LEAD THRESHOLD: 1 v
VENTRICULAR PACING PM: 99.96

## 2012-10-14 LAB — PROTIME-INR: Prothrombin Time: 12.3 s (ref 10.2–12.4)

## 2012-10-14 LAB — CBC WITH DIFFERENTIAL/PLATELET
Basophils Absolute: 0 10*3/uL (ref 0.0–0.1)
Basophils Relative: 0.5 % (ref 0.0–3.0)
Eosinophils Absolute: 0.2 10*3/uL (ref 0.0–0.7)
Eosinophils Relative: 2.4 % (ref 0.0–5.0)
HCT: 41.3 % (ref 39.0–52.0)
Hemoglobin: 13.7 g/dL (ref 13.0–17.0)
Lymphocytes Relative: 18.1 % (ref 12.0–46.0)
Lymphs Abs: 1.3 10*3/uL (ref 0.7–4.0)
MCHC: 33.2 g/dL (ref 30.0–36.0)
MCV: 90 fl (ref 78.0–100.0)
Monocytes Absolute: 0.8 10*3/uL (ref 0.1–1.0)
Monocytes Relative: 10.6 % (ref 3.0–12.0)
Neutro Abs: 4.8 10*3/uL (ref 1.4–7.7)
Neutrophils Relative %: 68.4 % (ref 43.0–77.0)
Platelets: 198 10*3/uL (ref 150.0–400.0)
RBC: 4.59 Mil/uL (ref 4.22–5.81)
RDW: 13.8 % (ref 11.5–14.6)
WBC: 7.1 10*3/uL (ref 4.5–10.5)

## 2012-10-14 LAB — BASIC METABOLIC PANEL
BUN: 15 mg/dL (ref 6–23)
CO2: 31 mEq/L (ref 19–32)
Calcium: 9.1 mg/dL (ref 8.4–10.5)
GFR: 58.22 mL/min — ABNORMAL LOW (ref >60.00)
Glucose, Bld: 98 mg/dL (ref 70–99)

## 2012-10-14 NOTE — Telephone Encounter (Signed)
Message copied by Tarri Fuller on Wed Oct 14, 2012  5:17 PM ------      Message from: [place], [place] T      Created: Wed Oct 14, 2012  5:15 PM       Labs ok for cath.      Tereso Newcomer, PA-C  5:15 PM 10/14/2012 ------

## 2012-10-14 NOTE — Telephone Encounter (Signed)
wife notified yesterday about echo results for pt and that PA will go over further results at 3/12 appt; wife verbalized understanding

## 2012-10-14 NOTE — Progress Notes (Signed)
Pacer check

## 2012-10-14 NOTE — Patient Instructions (Addendum)
Your physician has requested that you have a cardiac catheterization LEFT AND RIGHT HEART . Cardiac catheterization is used to diagnose and/or treat various heart conditions. Doctors may recommend this procedure for a number of different reasons. The most common reason is to evaluate chest pain. Chest pain can be a symptom of coronary artery disease (CAD), and cardiac catheterization can show whether plaque is narrowing or blocking your heart's arteries. This procedure is also used to evaluate the valves, as well as measure the blood flow and oxygen levels in different parts of your heart. For further information please visit [email or web address] Please follow instruction sheet, as given.  Your physician recommends that you return for lab work in: TODAY BMET, CBC W/DIFF, PT/INR  NO CHANGES WERE MADE TODAY   PER PAULA IN DEVICE CLINIC YOU ARE TO SEND IN A [employer] REMOTE DEVICE CHECK ON 11/16/12

## 2012-10-14 NOTE — Progress Notes (Signed)
[address], [address] [place], [place]  [address] Phone: [phone], Fax:  [phone]  Date:  10/14/2012   ID:  Malik Rojas, DOB March 18, 1932, MRN [id number]  PCP:  Carollee Herter, MD  Primary Cardiologist/Electrophysiologist:  Dr. Sherryl Manges     History of Present Illness: Malik Rojas is a 79 y.o. male who returns for f/u dyspnea.  He has a hx of HTN, DM2, HL, diastolic CHF, high grade HB, s/p pacer.  I saw him 2/27 with increased DOE (Class III).  I adjusted his Lasix for several days.  I also arranged a Myoview which was abnormal with EF 36%, apical inf scar, inf-apical HK, no ischemia.  Echo was done which confirmed EF 35% with worse HK in lat, dist inf-septal and apical segments and mild diastolic dysfunction.  I spoke with Dr. Graciela Husbands yesterday who felt like we should probably offer cardiac cath to further investigate.  Patient states his DOE is unchanged.  No chest pain, syncope, orthopnea, PND.  LE edema unchanged.    Labs (2/14):  K 4.7, creatinine 1.23, ALT 10, BNP 142, Hgb 14.4, A1c 6.6. CXR 2/14:  No acute findings. Labs (3/14):  K 6.1 (hemolyzed) => 3.7, creatinine 1.2 => 1.4  Wt Readings from Last 3 Encounters:  10/14/12 234 lb (106.142 kg)  10/08/12 236 lb (107.049 kg)  10/01/12 238 lb (107.956 kg)     Past Medical History  Diagnosis Date  . Complete heart block     Status post Medtronic pacemaker  . Diabetes mellitus   . HTN (hypertension)   . OSA (obstructive sleep apnea)   . Edema   . Obesity   . Systolic CHF     a. previously diastolic CHF - cath 12/05: no CAD, EF 65%;  b. echo 5/11: Ef 60%, mild LVH, mild-mod mitral annular calcification, mild LAE, mild TR, PASP 48;  => c.  Echo 3/14:  EF 35%, diff HK worse in inf, post, lat, dist inf-septal, apical segments, Gr 1 diast dysfn, mild MR, mod LAE, PASP 45  . HLD (hyperlipidemia)   . CKD (chronic kidney disease)   . Hx of cardiovascular stress test     a. [place] MV 3/14:  Low  risk, EF 36%, apical inf scar, no ischemia, inf-apical HK    Current Outpatient Prescriptions  Medication Sig Dispense Refill  . aspirin 81 MG tablet Take 81 mg by mouth daily.      . furosemide (LASIX) 80 MG tablet Take 80 mg by mouth 2 (two) times daily.       Marland Kitchen glimepiride (AMARYL) 1 MG tablet TAKE ONE TABLET BY MOUTH TWICE DAILY.  60 tablet  0  . labetalol (NORMODYNE) 300 MG tablet Take 1 tablet (300 mg total) by mouth 2 (two) times daily.  60 tablet  4  . lisinopril-hydrochlorothiazide (PRINZIDE,ZESTORETIC) 20-12.5 MG per tablet Take 1 tablet by mouth daily.  30 tablet  prn  . Omega-3 Fatty Acids (FISH OIL) 1000 MG CAPS Take 1 capsule by mouth daily.        . pravastatin (PRAVACHOL) 80 MG tablet Take 1 tablet (80 mg total) by mouth daily. PATIENT NEEDS A OFFICE VISIT BEFORE HIS MEDICATION RUNS OUT.  30 tablet  5  . sitaGLIPtan-metformin (JANUMET) 50-1000 MG per tablet Take 1 tablet by mouth 2 (two) times daily with a meal.  60 tablet  2  . tamsulosin (FLOMAX) 0.4 MG CAPS Take 1 capsule (0.4 mg total) by mouth 2 (two) times  daily.  60 capsule  0   No current facility-administered medications for this visit.    Allergies:   No Known Allergies  Social History:  The patient  reports that he quit smoking about 42 years ago. He has never used smokeless tobacco. He reports that  drinks alcohol. He reports that he does not use illicit drugs.   Family History:  The patient's family history includes Leukemia in his mother.   ROS:  Please see the history of present illness.    All other systems reviewed and negative.   PHYSICAL EXAM: VS:  BP 140/80  Pulse 68  Wt 234 lb (106.142 kg)  BMI 33.58 kg/m2 Well nourished, well developed, in no acute distress HEENT: normal Neck: no JVD Cardiac:  normal S1, S2; RRR; no murmur Lungs:  clear to auscultation bilaterally, no wheezing, rhonchi or rales Abd: soft, nontender, no hepatomegaly Ext: 1-2+ bilateral LE edema with chronic changes Skin: warm  and dry Neuro:  CNs 2-12 intact, no focal abnormalities noted  EKG:  NSR, V paced, HR 68     ASSESSMENT AND PLAN:  1. Systolic CHF:  Systolic dysfunction is new.  He has evidence of scar on myoview and wall motion abnormalities.  He is a diabetic.  As noted, I d/w Dr. Sherryl Manges and we have recommended cardiac cath. I had a long d/w the patient and his wife today regarding the findings on echo and stress testing and the rationale for proceeding with cardiac cath.  Risks and benefits of cardiac catheterization have been discussed with the patient.  These include bleeding, infection, kidney damage, stroke, heart attack, death.  The patient understands these risks and is willing to proceed. Will also get a right heart cath with hx of dyspnea, CHF and long hx of smoking.  Continue ACE and beta blocker.  Continue ASA and statin. 2. Cardiomyopathy:  Continue beta blocker and ACE.  Consider changing Labetalol to Coreg depending upon findings on cath.  3. Complete Heart Block, s/p Pacer:  Pacemaker interrogated today.  He is not yet at [facility].   4. Hypertension:  Controlled.  Continue current therapy.  5. Hyperlipidemia:  Continue statin. 6. Disposition:  Proceed with R+L HC as noted.   Luna Glasgow, PA-C  11:36 AM 10/14/2012

## 2012-10-14 NOTE — Telephone Encounter (Signed)
pt just left office and I forgot to tell him to make sure per Gunnar Fusi in device clinic to send in a remote transmission for [employer] for his pacemaker.

## 2012-10-14 NOTE — Telephone Encounter (Signed)
pt's wife notified about labs ok for cath, she verbalized understanding

## 2012-10-14 NOTE — H&P (Signed)
History and Physical  Date:  10/14/2012   ID:  Malik Rojas, DOB 19-Nov-1931, MRN [id number]  PCP:  Carollee Herter, MD  Primary Cardiologist/Electrophysiologist:  Dr. Sherryl Manges     History of Present Illness: Malik Rojas is a 79 y.o. male who returns for f/u dyspnea.  He has a hx of HTN, DM2, HL, diastolic CHF, high grade HB, s/p pacer.  I saw him 2/27 with increased DOE (Class III).  I adjusted his Lasix for several days.  I also arranged a Myoview which was abnormal with EF 36%, apical inf scar, inf-apical HK, no ischemia.  Echo was done which confirmed EF 35% with worse HK in lat, dist inf-septal and apical segments and mild diastolic dysfunction.  I spoke with Dr. Graciela Husbands yesterday who felt like we should probably offer cardiac cath to further investigate.  Patient states his DOE is unchanged.  No chest pain, syncope, orthopnea, PND.  LE edema unchanged.    Labs (2/14):  K 4.7, creatinine 1.23, ALT 10, BNP 142, Hgb 14.4, A1c 6.6. CXR 2/14:  No acute findings. Labs (3/14):  K 6.1 (hemolyzed) => 3.7, creatinine 1.2 => 1.4  Wt Readings from Last 3 Encounters:  10/14/12 234 lb (106.142 kg)  10/08/12 236 lb (107.049 kg)  10/01/12 238 lb (107.956 kg)     Past Medical History  Diagnosis Date  . Complete heart block     Status post Medtronic pacemaker  . Diabetes mellitus   . HTN (hypertension)   . OSA (obstructive sleep apnea)   . Edema   . Obesity   . Systolic CHF     a. previously diastolic CHF - cath 12/05: no CAD, EF 65%;  b. echo 5/11: Ef 60%, mild LVH, mild-mod mitral annular calcification, mild LAE, mild TR, PASP 48;  => c.  Echo 3/14:  EF 35%, diff HK worse in inf, post, lat, dist inf-septal, apical segments, Gr 1 diast dysfn, mild MR, mod LAE, PASP 45  . HLD (hyperlipidemia)   . CKD (chronic kidney disease)   . Hx of cardiovascular stress test     a. Aden MV 3/14:  Low risk, EF 36%, apical inf scar, no ischemia, inf-apical HK    Current Outpatient  Prescriptions  Medication Sig Dispense Refill  . aspirin 81 MG tablet Take 81 mg by mouth daily.      . furosemide (LASIX) 80 MG tablet Take 80 mg by mouth 2 (two) times daily.       Marland Kitchen glimepiride (AMARYL) 1 MG tablet TAKE ONE TABLET BY MOUTH TWICE DAILY.  60 tablet  0  . labetalol (NORMODYNE) 300 MG tablet Take 1 tablet (300 mg total) by mouth 2 (two) times daily.  60 tablet  4  . lisinopril-hydrochlorothiazide (PRINZIDE,ZESTORETIC) 20-12.5 MG per tablet Take 1 tablet by mouth daily.  30 tablet  prn  . Omega-3 Fatty Acids (FISH OIL) 1000 MG CAPS Take 1 capsule by mouth daily.        . pravastatin (PRAVACHOL) 80 MG tablet Take 1 tablet (80 mg total) by mouth daily. PATIENT NEEDS A OFFICE VISIT BEFORE HIS MEDICATION RUNS OUT.  30 tablet  5  . sitaGLIPtan-metformin (JANUMET) 50-1000 MG per tablet Take 1 tablet by mouth 2 (two) times daily with a meal.  60 tablet  2  . tamsulosin (FLOMAX) 0.4 MG CAPS Take 1 capsule (0.4 mg total) by mouth 2 (two) times daily.  60 capsule  0   No current facility-administered medications for this  visit.    Allergies:   No Known Allergies  Social History:  The patient  reports that he quit smoking about 42 years ago. He has never used smokeless tobacco. He reports that  drinks alcohol. He reports that he does not use illicit drugs.   Family History:  The patient's family history includes Leukemia in his mother.   ROS:  Please see the history of present illness.    All other systems reviewed and negative.   PHYSICAL EXAM: VS:  BP 140/80  Pulse 68  Wt 234 lb (106.142 kg)  BMI 33.58 kg/m2 Well nourished, well developed, in no acute distress HEENT: normal Neck: no JVD Cardiac:  normal S1, S2; RRR; no murmur Lungs:  clear to auscultation bilaterally, no wheezing, rhonchi or rales Abd: soft, nontender, no hepatomegaly Ext: 1-2+ bilateral LE edema with chronic changes Skin: warm and dry Neuro:  CNs 2-12 intact, no focal abnormalities noted  EKG:  NSR, V  paced, HR 68     ASSESSMENT AND PLAN:  1. Systolic CHF:  Systolic dysfunction is new.  He has evidence of scar on myoview and wall motion abnormalities.  He is a diabetic.  As noted, I d/w Dr. Sherryl Manges and we have recommended cardiac cath. I had a long d/w the patient and his wife today regarding the findings on echo and stress testing and the rationale for proceeding with cardiac cath.  Risks and benefits of cardiac catheterization have been discussed with the patient.  These include bleeding, infection, kidney damage, stroke, heart attack, death.  The patient understands these risks and is willing to proceed. Will also get a right heart cath with hx of dyspnea, CHF and long hx of smoking.  Continue ACE and beta blocker.  Continue ASA and statin. 2. Cardiomyopathy:  Continue beta blocker and ACE.  Consider changing Labetalol to Coreg depending upon findings on cath.  3. Complete Heart Block, s/p Pacer:  Pacemaker interrogated today.  He is not yet at [facility].   4. Hypertension:  Controlled.  Continue current therapy.  5. Hyperlipidemia:  Continue statin. 6. Disposition:  Proceed with R+L HC as noted.   Luna Glasgow, PA-C  11:36 AM 10/14/2012

## 2012-10-15 ENCOUNTER — Encounter: Payer: Self-pay | Admitting: Internal Medicine

## 2012-10-15 ENCOUNTER — Telehealth: Payer: Self-pay | Admitting: Internal Medicine

## 2012-10-15 MED ORDER — SITAGLIPTIN PHOS-METFORMIN HCL 50-1000 MG PO TABS: 1.0000 | ORAL_TABLET | Freq: Two times a day (BID) | ORAL | Status: DC

## 2012-10-15 NOTE — Telephone Encounter (Signed)
Sent in diabetes med with 3 refills

## 2012-10-20 ENCOUNTER — Encounter (HOSPITAL_BASED_OUTPATIENT_CLINIC_OR_DEPARTMENT_OTHER)
Admission: RE | Disposition: A | Discharge status: Home / Self Care | Payer: Self-pay | Source: Ambulatory Visit | Attending: Cardiology

## 2012-10-20 ENCOUNTER — Encounter (HOSPITAL_BASED_OUTPATIENT_CLINIC_OR_DEPARTMENT_OTHER): Payer: Self-pay | Admitting: *Deleted

## 2012-10-20 ENCOUNTER — Inpatient Hospital Stay (HOSPITAL_BASED_OUTPATIENT_CLINIC_OR_DEPARTMENT_OTHER)
Admission: RE | Admit: 2012-10-20 | Discharge: 2012-10-20 | Disposition: A | Discharge status: Home / Self Care | Payer: Medicare Other | Source: Ambulatory Visit | Attending: Cardiology | Admitting: Cardiology

## 2012-10-20 DIAGNOSIS — I2510 Atherosclerotic heart disease of native coronary artery without angina pectoris: Secondary | ICD-10-CM

## 2012-10-20 DIAGNOSIS — I2789 Other specified pulmonary heart diseases: Secondary | ICD-10-CM | POA: Insufficient documentation

## 2012-10-20 DIAGNOSIS — R9389 Abnormal findings on diagnostic imaging of other specified body structures: Secondary | ICD-10-CM | POA: Insufficient documentation

## 2012-10-20 LAB — POCT I-STAT 3, ART BLOOD GAS (G3+)
Acid-Base Excess: 1 mmol/L (ref 0.0–2.0)
O2 Saturation: 89 %
TCO2: 31 mmol/L (ref 0–100)
pCO2 arterial: 62 mmHg (ref 35.0–45.0)

## 2012-10-20 LAB — POCT I-STAT 3, VENOUS BLOOD GAS (G3P V)
Bicarbonate: 28.6 mEq/L — ABNORMAL HIGH (ref 20.0–24.0)
O2 Saturation: 64 %
TCO2: 31 mmol/L (ref 0–100)
pCO2, Ven: 64.6 mmHg — ABNORMAL HIGH (ref 45.0–50.0)
pO2, Ven: 39 mmHg (ref 30.0–45.0)

## 2012-10-20 SURGERY — JV LEFT AND RIGHT HEART CATHETERIZATION WITH CORONARY ANGIOGRAM

## 2012-10-20 MED ORDER — ASPIRIN 81 MG PO CHEW
324.0000 mg | CHEWABLE_TABLET | ORAL | Status: AC
  Administered 2012-10-20: 324 mg via ORAL

## 2012-10-20 MED ORDER — SODIUM CHLORIDE 0.9 % IJ SOLN: 3.0000 mL | Freq: Two times a day (BID) | INTRAMUSCULAR | Status: DC

## 2012-10-20 MED ORDER — ACETAMINOPHEN 325 MG PO TABS: 650.0000 mg | ORAL_TABLET | ORAL | Status: DC | PRN

## 2012-10-20 MED ORDER — SODIUM CHLORIDE 0.9 % IJ SOLN: 3.0000 mL | INTRAMUSCULAR | Status: DC | PRN

## 2012-10-20 MED ORDER — ONDANSETRON HCL 4 MG/2ML IJ SOLN: 4.0000 mg | Freq: Four times a day (QID) | INTRAMUSCULAR | Status: DC | PRN

## 2012-10-20 MED ORDER — SODIUM CHLORIDE 0.9 % IV SOLN
INTRAVENOUS | Status: DC
  Administered 2012-10-20: 08:00:00 via INTRAVENOUS

## 2012-10-20 MED ORDER — SODIUM CHLORIDE 0.9 % IV SOLN: 250.0000 mL | INTRAVENOUS | Status: DC | PRN

## 2012-10-20 MED ORDER — SODIUM CHLORIDE 0.9 % IV SOLN: INTRAVENOUS | Status: DC

## 2012-10-20 NOTE — CV Procedure (Signed)
   Cardiac Catheterization Procedure Note  Name: Malik Rojas MRN: [id number] DOB: 04/26/1932  Procedure: Right Heart Cath, Left Heart Cath, Selective Coronary Angiography, LV angiography  Indication: 79 year old white male with history of diabetes, hypertension, obstructive sleep apnea, and hyperlipidemia presents with progressive dyspnea. Echocardiogram shows reduction in ejection fraction to 35% with an inferior wall motion abnormality.   Procedural Details: The right groin was prepped, draped, and anesthetized with 1% lidocaine. Using the modified Seldinger technique a 5 French sheath was placed in the right femoral artery and a 7 French sheath was placed in the right femoral vein. A Swan-Ganz catheter was used for the right heart catheterization. Standard protocol was followed for recording of right heart pressures and sampling of oxygen saturations. Fick cardiac output was calculated. Standard Judkins catheters were used for selective coronary angiography and left ventriculography. There were no immediate procedural complications. The patient was transferred to the post catheterization recovery area for further monitoring.  Procedural Findings: Hemodynamics RA 23/21 with a mean of 18 mmHg RV 58/17 mmHg PA 57/26 with a mean of 39 mmHg PCWP 29/27 with a mean of 23 mmHg LV 168/26 mmHg AO 166/74 with a mean of 110 mmHg  There is no significant aortic or mitral valve gradient.  Oxygen saturations: PA 64% AO 89%  Cardiac Output (Fick) 6 liters per minute  Cardiac Index (Fick) 2.7 L per minute per meter square   Coronary angiography: Coronary dominance: right  Left mainstem: The left main coronary is very large. It tapers to 10% distally.  Left anterior descending (LAD): The LAD is a large vessel which gives rise to a moderately large diagonal branch. There is 20% narrowing in the LAD immediately after the diagonal. Otherwise no significant obstructive disease. The first diagonal  is normal.  There is a moderate size ramus intermediate branch which is normal.  Left circumflex (LCx): The left circumflex coronary has mild irregularities less than 10-20%.  Right coronary artery (RCA): The right coronary is a dominant vessel. It has 20% disease proximally. At the bifurcation distally there is 40-50% stenosis.  Left ventriculography: Left ventricular systolic function is normal overall, LVEF is estimated at 50-55 %, there is inferior apical dyssynergy. there is no significant mitral regurgitation   Final Conclusions:   1. Nonobstructive coronary disease. 2. Normal left ventricular function. 3. Moderate pulmonary hypertension 4. Elevated left ventricular filling pressures  Recommendations: Optimize medical therapy for diastolic dysfunction. Consider pulmonary evaluation to optimize treatment of his sleep apnea.   Theron Arista [facility] 10/20/2012, 9:47 AM

## 2012-10-20 NOTE — Interval H&P Note (Signed)
History and Physical Interval Note:  10/20/2012 9:09 AM  Malik Rojas  has presented today for surgery, with the diagnosis of cp  The various methods of treatment have been discussed with the patient and family. After consideration of risks, benefits and other options for treatment, the patient has consented to  Procedure(s): JV LEFT AND RIGHT HEART CATHETERIZATION WITH CORONARY ANGIOGRAM (N/A) as a surgical intervention .  The patient's history has been reviewed, patient examined, no change in status, stable for surgery.  I have reviewed the patient's chart and labs.  Questions were answered to the patient's satisfaction.     Latori Beggs [place] MD,FACC 10/20/2012 9:09 AM

## 2012-10-20 NOTE — Progress Notes (Signed)
Bedrest begins @ 1000, tegaderm dressing applied to right groin site by Venda Rodes, site level 0.

## 2012-11-13 ENCOUNTER — Other Ambulatory Visit: Payer: Self-pay | Admitting: Family Medicine

## 2012-11-13 NOTE — Telephone Encounter (Signed)
Is this okay?

## 2012-11-16 ENCOUNTER — Encounter: Payer: Medicare Other | Admitting: *Deleted

## 2012-11-18 ENCOUNTER — Encounter: Payer: Self-pay | Admitting: Internal Medicine

## 2012-11-18 ENCOUNTER — Ambulatory Visit (INDEPENDENT_AMBULATORY_CARE_PROVIDER_SITE_OTHER): Payer: Medicare Other | Admitting: Internal Medicine

## 2012-11-18 VITALS — BP 174/77 | HR 61 | Wt 242.4 lb

## 2012-11-18 DIAGNOSIS — I10 Essential (primary) hypertension: Secondary | ICD-10-CM

## 2012-11-18 DIAGNOSIS — Z950 Presence of cardiac pacemaker: Secondary | ICD-10-CM

## 2012-11-18 DIAGNOSIS — R609 Edema, unspecified: Secondary | ICD-10-CM

## 2012-11-18 DIAGNOSIS — I442 Atrioventricular block, complete: Secondary | ICD-10-CM

## 2012-11-18 DIAGNOSIS — G4733 Obstructive sleep apnea (adult) (pediatric): Secondary | ICD-10-CM

## 2012-11-18 LAB — PACEMAKER DEVICE OBSERVATION
ATRIAL PACING PM: 33.32
BAMS-0001: 170 {beats}/min
BATTERY VOLTAGE: 2.83 V
RV LEAD IMPEDENCE PM: 632 Ohm
VENTRICULAR PACING PM: 99.98

## 2012-11-18 NOTE — Patient Instructions (Addendum)
Your physician recommends that you schedule a follow-up appointment in: ONE MONTH WITH THE DEVICE CLINIC

## 2012-11-18 NOTE — Assessment & Plan Note (Signed)
The patient's device was interrogated.  The information was reviewed. No changes were made in the programming.    

## 2012-11-18 NOTE — Assessment & Plan Note (Signed)
Poorly controlled. I think it is related in part to sleep apnea obesity and volume overload. We spent more than 25 minutes discussing the potential benefits of sleep study. He had a miserable experience previously. He is at this point disinclined to proceed.  We also reviewed the importance of salt restriction and volume restriction. Will have him take his Lasix in the morning and at lunch and 7 in the evening. He will sleep better.

## 2012-11-18 NOTE — Progress Notes (Signed)
Patient Care Team: Ronnald Nian, MD as PCP - General   HPI  Malik Rojas is a 79 y.o. male Seen in followup for pacemaker implantation for high-grade heart block and presyncope.  He has been seen intercurrently by Tereso Newcomer for dyspnea. Myoview scanning demonstrated EF 36% wall motion abnormalities but no ischemia. Echo was confirming. The change in ejection fraction was new and he underwent catheterization>>demonstrated no obstructive disease. Interestingly, ejection fraction by ventriculography was 60%.significantly elevated filling pressures were noted  He has significant sleep obstructive breathing patterns. He has peripheral edema. We reviewed his volume intake and it is quite copious. Salt intake is  mildly elevated  Past Medical History  Diagnosis Date  . Complete heart block     Status post Medtronic pacemaker  . Diabetes mellitus   . HTN (hypertension)   . OSA (obstructive sleep apnea)   . Edema   . Obesity   . Systolic CHF     a. previously diastolic CHF - cath 12/05: no CAD, EF 65%;  b. echo 5/11: Ef 60%, mild LVH, mild-mod mitral annular calcification, mild LAE, mild TR, PASP 48;  => c.  Echo 3/14:  EF 35%, diff HK worse in inf, post, lat, dist inf-septal, apical segments, Gr 1 diast dysfn, mild MR, mod LAE, PASP 45  . HLD (hyperlipidemia)   . CKD (chronic kidney disease)   . Hx of cardiovascular stress test     a. Aden MV 3/14:  Low risk, EF 36%, apical inf scar, no ischemia, inf-apical HK    Past Surgical History  Procedure Laterality Date  . Pacemaker insertion      medtronic EnRhythm  . Appendectomy      Current Outpatient Prescriptions  Medication Sig Dispense Refill  . aspirin 81 MG tablet Take 81 mg by mouth daily.      . furosemide (LASIX) 80 MG tablet Take 80 mg by mouth 2 (two) times daily.       Marland Kitchen glimepiride (AMARYL) 1 MG tablet TAKE 1 TABLET BY MOUTH TWICE DAILY  60 tablet  3  . labetalol (NORMODYNE) 300 MG tablet Take 1 tablet (300 mg  total) by mouth 2 (two) times daily.  60 tablet  4  . lisinopril-hydrochlorothiazide (PRINZIDE,ZESTORETIC) 20-12.5 MG per tablet Take 1 tablet by mouth daily.  30 tablet  prn  . Omega-3 Fatty Acids (FISH OIL) 1000 MG CAPS Take 1 capsule by mouth daily.        . pravastatin (PRAVACHOL) 80 MG tablet Take 1 tablet (80 mg total) by mouth daily. PATIENT NEEDS A OFFICE VISIT BEFORE HIS MEDICATION RUNS OUT.  30 tablet  5  . sitaGLIPtan-metformin (JANUMET) 50-1000 MG per tablet Take 1 tablet by mouth 2 (two) times daily with a meal.  60 tablet  3  . tamsulosin (FLOMAX) 0.4 MG CAPS TAKE 1 CAPSULE BY MOUTH TWICE DAILY  60 capsule  5   No current facility-administered medications for this visit.    No Known Allergies  Review of Systems negative except from HPI and PMH  Physical Exam BP 174/77  Pulse 61  Wt 242 lb 6 oz (109.941 kg)  BMI 32.86 kg/m2 Well developed and well nourished in no acute distress HENT normal E scleral and icterus clear Neck Supple JVP 8-10; carotids brisk and full Clear to ausculation Regular rate and rhythm, no murmurs gallops or rub Soft with active bowel sounds No clubbing cyanosis 2+ Edema Alert and oriented, grossly normal motor and sensory function Skin  Warm and Dry    Assessment and  Plan

## 2012-11-18 NOTE — Assessment & Plan Note (Signed)
As above.

## 2012-11-18 NOTE — Assessment & Plan Note (Signed)
Stable post device implant

## 2012-11-19 ENCOUNTER — Encounter: Payer: Medicare Other | Admitting: Internal Medicine

## 2012-11-20 ENCOUNTER — Telehealth: Payer: Self-pay | Admitting: Internal Medicine

## 2012-11-20 NOTE — Telephone Encounter (Signed)
Spoke with pt wife, she states the pt was given oxygen in the hosp and he did great with it. She is wondering if dr Graciela Husbands thinks the pt wearing oxygen at night would help him. Explained to wife the pt would need to have 02 sat testing to confirm whether or not it would be beneficial. Wife would like for me to discuss with dr Graciela Husbands.

## 2012-11-20 NOTE — Telephone Encounter (Signed)
Discussed with dr Graciela Husbands, spoke with wife, the pt would benefit more from sleep apnea testing than oxygen. Oxygen is not going to help if he is not breathing. Wife voiced understanding.

## 2012-11-20 NOTE — Telephone Encounter (Signed)
New problem     Was seen in the office on  4/16 .    Need to discuss C-pap or oxygen usage at night.

## 2012-11-23 ENCOUNTER — Encounter: Payer: Self-pay | Admitting: Internal Medicine

## 2012-11-25 ENCOUNTER — Other Ambulatory Visit: Payer: Self-pay | Admitting: Family Medicine

## 2012-12-17 ENCOUNTER — Ambulatory Visit (INDEPENDENT_AMBULATORY_CARE_PROVIDER_SITE_OTHER): Payer: Medicare Other | Admitting: *Deleted

## 2012-12-17 DIAGNOSIS — I442 Atrioventricular block, complete: Secondary | ICD-10-CM

## 2012-12-17 LAB — PACEMAKER DEVICE OBSERVATION
BATTERY VOLTAGE: 2.83 V
BRDY-0002RA: 60 {beats}/min
BRDY-0004RA: 130 {beats}/min

## 2012-12-17 NOTE — Progress Notes (Signed)
PPM interrogation for battery data. 

## 2012-12-25 ENCOUNTER — Encounter: Payer: Self-pay | Admitting: *Deleted

## 2013-01-08 ENCOUNTER — Emergency Department (HOSPITAL_COMMUNITY): Payer: Medicare Other

## 2013-01-08 ENCOUNTER — Encounter (HOSPITAL_COMMUNITY): Payer: Self-pay | Admitting: Emergency Medicine

## 2013-01-08 ENCOUNTER — Inpatient Hospital Stay (HOSPITAL_COMMUNITY)
Admission: EM | Admit: 2013-01-08 | Discharge: 2013-01-12 | DRG: 291 | Disposition: A | Discharge status: Home Health | Payer: Medicare Other | Attending: Cardiology | Admitting: Cardiology

## 2013-01-08 DIAGNOSIS — I110 Hypertensive heart disease with heart failure: Secondary | ICD-10-CM | POA: Diagnosis present

## 2013-01-08 DIAGNOSIS — G4733 Obstructive sleep apnea (adult) (pediatric): Secondary | ICD-10-CM | POA: Diagnosis present

## 2013-01-08 DIAGNOSIS — Z91199 Patient's noncompliance with other medical treatment and regimen due to unspecified reason: Secondary | ICD-10-CM

## 2013-01-08 DIAGNOSIS — Z950 Presence of cardiac pacemaker: Secondary | ICD-10-CM

## 2013-01-08 DIAGNOSIS — E669 Obesity, unspecified: Secondary | ICD-10-CM | POA: Diagnosis present

## 2013-01-08 DIAGNOSIS — N189 Chronic kidney disease, unspecified: Secondary | ICD-10-CM | POA: Diagnosis present

## 2013-01-08 DIAGNOSIS — I5043 Acute on chronic combined systolic (congestive) and diastolic (congestive) heart failure: Principal | ICD-10-CM | POA: Diagnosis present

## 2013-01-08 DIAGNOSIS — I428 Other cardiomyopathies: Secondary | ICD-10-CM | POA: Diagnosis present

## 2013-01-08 DIAGNOSIS — I442 Atrioventricular block, complete: Secondary | ICD-10-CM | POA: Diagnosis present

## 2013-01-08 DIAGNOSIS — J9690 Respiratory failure, unspecified, unspecified whether with hypoxia or hypercapnia: Secondary | ICD-10-CM

## 2013-01-08 DIAGNOSIS — E119 Type 2 diabetes mellitus without complications: Secondary | ICD-10-CM | POA: Diagnosis present

## 2013-01-08 DIAGNOSIS — E785 Hyperlipidemia, unspecified: Secondary | ICD-10-CM | POA: Diagnosis present

## 2013-01-08 DIAGNOSIS — Z87891 Personal history of nicotine dependence: Secondary | ICD-10-CM

## 2013-01-08 DIAGNOSIS — E0821 Diabetes mellitus due to underlying condition with diabetic nephropathy: Secondary | ICD-10-CM | POA: Diagnosis present

## 2013-01-08 DIAGNOSIS — Z9119 Patient's noncompliance with other medical treatment and regimen: Secondary | ICD-10-CM

## 2013-01-08 DIAGNOSIS — I2510 Atherosclerotic heart disease of native coronary artery without angina pectoris: Secondary | ICD-10-CM | POA: Diagnosis present

## 2013-01-08 DIAGNOSIS — Z6832 Body mass index (BMI) 32.0-32.9, adult: Secondary | ICD-10-CM

## 2013-01-08 DIAGNOSIS — J9600 Acute respiratory failure, unspecified whether with hypoxia or hypercapnia: Secondary | ICD-10-CM | POA: Diagnosis present

## 2013-01-08 DIAGNOSIS — I129 Hypertensive chronic kidney disease with stage 1 through stage 4 chronic kidney disease, or unspecified chronic kidney disease: Secondary | ICD-10-CM | POA: Diagnosis present

## 2013-01-08 DIAGNOSIS — Z79899 Other long term (current) drug therapy: Secondary | ICD-10-CM

## 2013-01-08 DIAGNOSIS — I509 Heart failure, unspecified: Secondary | ICD-10-CM

## 2013-01-08 DIAGNOSIS — I5032 Chronic diastolic (congestive) heart failure: Secondary | ICD-10-CM | POA: Diagnosis present

## 2013-01-08 DIAGNOSIS — E876 Hypokalemia: Secondary | ICD-10-CM | POA: Diagnosis present

## 2013-01-08 LAB — POCT I-STAT 3, ART BLOOD GAS (G3+)
Acid-Base Excess: 1 mmol/L (ref 0.0–2.0)
Bicarbonate: 31.3 mEq/L — ABNORMAL HIGH (ref 20.0–24.0)
O2 Saturation: 98 %
Patient temperature: 98.6
TCO2: 33 mmol/L (ref 0–100)
pCO2 arterial: 59.8 mmHg (ref 35.0–45.0)
pH, Arterial: 7.327 — ABNORMAL LOW (ref 7.350–7.450)
pH, Arterial: 7.302 — ABNORMAL LOW (ref 7.350–7.450)
pO2, Arterial: 125 mmHg — ABNORMAL HIGH (ref 80.0–100.0)

## 2013-01-08 LAB — COMPREHENSIVE METABOLIC PANEL
ALT: 10 U/L (ref 0–53)
CO2: 28 mEq/L (ref 19–32)
Calcium: 9.5 mg/dL (ref 8.4–10.5)
Creatinine, Ser: 1.23 mg/dL (ref 0.50–1.35)
GFR calc Af Amer: 63 mL/min — ABNORMAL LOW (ref >90)
GFR calc non Af Amer: 54 mL/min — ABNORMAL LOW (ref >90)
Glucose, Bld: 179 mg/dL — ABNORMAL HIGH (ref 70–99)

## 2013-01-08 LAB — URINALYSIS, ROUTINE W REFLEX MICROSCOPIC
Protein, ur: >300 mg/dL — AB
Urobilinogen, UA: 0.2 mg/dL (ref 0.0–1.0)

## 2013-01-08 LAB — POCT I-STAT TROPONIN I: Troponin i, poc: 0.02 ng/mL (ref 0.00–0.08)

## 2013-01-08 LAB — CBC WITH DIFFERENTIAL/PLATELET
Eosinophils Relative: 3 % (ref 0–5)
HCT: 46.3 % (ref 39.0–52.0)
Lymphocytes Relative: 21 % (ref 12–46)
Lymphs Abs: 1.6 10*3/uL (ref 0.7–4.0)
MCV: 90.1 fL (ref 78.0–100.0)
Monocytes Absolute: 0.8 10*3/uL (ref 0.1–1.0)
RBC: 5.14 MIL/uL (ref 4.22–5.81)
WBC: 7.7 10*3/uL (ref 4.0–10.5)

## 2013-01-08 LAB — URINE MICROSCOPIC-ADD ON

## 2013-01-08 MED ORDER — FUROSEMIDE 10 MG/ML IJ SOLN
80.0000 mg | Freq: Once | INTRAMUSCULAR | Status: AC
  Administered 2013-01-08: 80 mg via INTRAVENOUS
  Filled 2013-01-08: qty 8

## 2013-01-08 MED ORDER — ASPIRIN 81 MG PO CHEW: 324.0000 mg | CHEWABLE_TABLET | Freq: Once | ORAL | Status: AC

## 2013-01-08 MED ORDER — ALBUTEROL (5 MG/ML) CONTINUOUS INHALATION SOLN
10.0000 mg/h | INHALATION_SOLUTION | Freq: Once | RESPIRATORY_TRACT | Status: AC
  Administered 2013-01-09: 10 mg/h via RESPIRATORY_TRACT
  Filled 2013-01-08: qty 20

## 2013-01-08 NOTE — ED Provider Notes (Signed)
History     CSN: [id number]  Arrival date & time 01/08/13  1947   First MD Initiated Contact with Patient 01/08/13 1951      Chief Complaint  Patient presents with  . Shortness of Breath  . Congestive Heart Failure    (Consider location/radiation/quality/duration/timing/severity/associated sxs/prior treatment) HPI Comments: Pt comes in with cc of DIB. Has hx of CHF, EF 35%, IDDM, HTN, CAD.  Pt reports that over the past several weeks, he has noticed progressive dyspnea, now unable to walk from one room to another. He has no chest pains, no new cough, fevers, chills. Pt also admits to some opthopnea, and PND and leg swelling. No hx of PE, DVT.  Patient is a 79 y.o. male presenting with shortness of breath and CHF. The history is provided by the patient and medical records.  Shortness of Breath Associated symptoms: no chest pain, no cough, no fever, no headaches and no neck pain   Congestive Heart Failure Associated symptoms include shortness of breath. Pertinent negatives include no chest pain and no headaches.    Past Medical History  Diagnosis Date  . Complete heart block     Status post Medtronic pacemaker  . Diabetes mellitus   . HTN (hypertension)   . OSA (obstructive sleep apnea)   . Edema   . Obesity   . Chronic diastolic heart failure     Ejection fraction 55% with elevated end-diastolic pressure-catheterization 3/14  . HLD (hyperlipidemia)   . CKD (chronic kidney disease)   . Hx of cardiovascular stress test     a. Aden MV 3/14:  Low risk, EF 36%, apical inf scar, no ischemia, inf-apical HK>> catheterization however demonstrated normal left ventricular function  . Cardiac pacemaker Medtronic     Past Surgical History  Procedure Laterality Date  . Pacemaker insertion      medtronic EnRhythm  . Appendectomy      Family History  Problem Relation Age of Onset  . Leukemia Mother     History  Substance Use Topics  . Smoking status: Former Smoker -- 5.00  packs/day for 12 years    Quit date: 08/05/1970  . Smokeless tobacco: Former Neurosurgeon    Quit date: 01/09/1964  . Alcohol Use: Yes     Comment: occasional      Review of Systems  Constitutional: Negative for fever, chills and activity change.  HENT: Negative for neck pain.   Eyes: Negative for visual disturbance.  Respiratory: Positive for shortness of breath. Negative for cough and chest tightness.   Cardiovascular: Negative for chest pain.  Gastrointestinal: Negative for abdominal distention.  Genitourinary: Negative for dysuria, enuresis and difficulty urinating.  Musculoskeletal: Negative for arthralgias.  Neurological: Positive for dizziness. Negative for light-headedness and headaches.  Hematological: Does not bruise/bleed easily.  Psychiatric/Behavioral: Negative for confusion.    Allergies  Review of patient's allergies indicates no known allergies.  Home Medications   Current Outpatient Rx  Name  Route  Sig  Dispense  Refill  . aspirin 81 MG tablet   Oral   Take 81 mg by mouth daily.         . furosemide (LASIX) 80 MG tablet   Oral   Take 80 mg by mouth 2 (two) times daily.          Marland Kitchen glimepiride (AMARYL) 1 MG tablet      TAKE 1 TABLET BY MOUTH TWICE DAILY   60 tablet   3   . labetalol (NORMODYNE) 300 MG  tablet   Oral   Take 1 tablet (300 mg total) by mouth 2 (two) times daily.   60 tablet   4   . lisinopril-hydrochlorothiazide (PRINZIDE,ZESTORETIC) 20-12.5 MG per tablet   Oral   Take 1 tablet by mouth daily.   30 tablet   prn   . Omega-3 Fatty Acids (FISH OIL) 1000 MG CAPS   Oral   Take 1 capsule by mouth daily.           . pravastatin (PRAVACHOL) 80 MG tablet      TAKE ONE TABLET BY MOUTH ONE TIME DAILY   30 tablet   5   . sitaGLIPtan-metformin (JANUMET) 50-1000 MG per tablet   Oral   Take 1 tablet by mouth 2 (two) times daily with a meal.   60 tablet   3   . tamsulosin (FLOMAX) 0.4 MG CAPS      TAKE 1 CAPSULE BY MOUTH TWICE  DAILY   60 capsule   5     BP 147/70  Pulse 76  Resp 22  SpO2 97%  Physical Exam  Nursing note and vitals reviewed. Constitutional: He is oriented to person, place, and time. He appears well-developed.  HENT:  Head: Normocephalic and atraumatic.  Eyes: Conjunctivae and EOM are normal. Pupils are equal, round, and reactive to light.  Neck: Normal range of motion. Neck supple. JVD present.  Cardiovascular: Normal rate and regular rhythm.   Pulmonary/Chest: He is in respiratory distress. He has rales.  Tachypnea, accessory muscle use Bibasilar rales  Abdominal: Soft. Bowel sounds are normal. He exhibits no distension. There is no tenderness. There is no rebound and no guarding.  Musculoskeletal: He exhibits edema.  Neurological: He is alert and oriented to person, place, and time.  Skin: Skin is warm.    ED Course  Procedures (including critical care time)  Labs Reviewed  PRO B NATRIURETIC PEPTIDE - Abnormal; Notable for the following:    Pro B Natriuretic peptide (BNP) 2271.0 (*)    All other components within normal limits  COMPREHENSIVE METABOLIC PANEL - Abnormal; Notable for the following:    Glucose, Bld 179 (*)    GFR calc non Af Amer 54 (*)    GFR calc Af Amer 63 (*)    All other components within normal limits  URINALYSIS, ROUTINE W REFLEX MICROSCOPIC - Abnormal; Notable for the following:    APPearance HAZY (*)    Glucose, UA 100 (*)    Hgb urine dipstick MODERATE (*)    Protein, ur >300 (*)    All other components within normal limits  URINE MICROSCOPIC-ADD ON - Abnormal; Notable for the following:    Casts HYALINE CASTS (*)    All other components within normal limits  POCT I-STAT 3, BLOOD GAS (G3+) - Abnormal; Notable for the following:    pH, Arterial 7.327 (*)    pCO2 arterial 59.8 (*)    pO2, Arterial 125.0 (*)    Bicarbonate 31.3 (*)    Acid-Base Excess 3.0 (*)    All other components within normal limits  POCT I-STAT 3, BLOOD GAS (G3+) - Abnormal;  Notable for the following:    pH, Arterial 7.302 (*)    pCO2 arterial 59.5 (*)    pO2, Arterial 108.0 (*)    Bicarbonate 29.4 (*)    All other components within normal limits  CBC WITH DIFFERENTIAL  MAGNESIUM  BLOOD GAS, ARTERIAL  BLOOD GAS, ARTERIAL  POCT I-STAT TROPONIN I  CG4 I-STAT (  LACTIC ACID)   Dg Chest Portable 1 View  01/08/2013   *RADIOLOGY REPORT*  Clinical Data: Shortness of breath.  Congestive heart failure.  PORTABLE CHEST - 1 VIEW  Comparison: Chest x-ray 09/28/2012.  Findings: There is cephalization of the pulmonary vasculature, indistinctness of the interstitial markings, and patchy airspace disease throughout the lungs bilaterally suggestive of moderate pulmonary edema.  Small bilateral pleural effusions.  Mild cardiomegaly.  Upper mediastinal contours are within normal limits given the lordotic positioning and low lung volumes.  Left-sided pacemaker device in place with lead tips projecting over the expected location of the right atrium and right ventricular apex.  IMPRESSION: 1.  Findings, as above, concerning for congestive heart failure.   Original Report Authenticated By: Trudie Reed, M.D.     No diagnosis found.    MDM   Date: 01/08/2013  Rate: 95  Rhythm: normal sinus rhythm  QRS Axis: left  Intervals: QT prolonged  ST/T Wave abnormalities: nonspecific ST/T changes  Conduction Disutrbances:none  Narrative Interpretation:   Old EKG Reviewed: changes noted - QT prolonged  Differential diagnosis includes: ACS syndrome CHF exacerbation Valvular disorder Myocarditis Pericarditis Pericardial effusion Pneumonia Pleural effusion Pulmonary edema PE Anemia Musculoskeletal pain  Pt comes in with cc of DIB. He is having progressive dyspnea, with orthopnea, PND. Exam indicates some rales. BP is not terribly high. Pt is in respiratory distress, and with O2 sats in the 80s per RN on room at arrival - so likely has respiratory failure from what clinically  appears to be CHF exacerbation.  We will start patient on bipap. Get ABG and basic cardiac labs. Will need admission, step down vs. Telemetry, depending on how he responds.  10:55 PM Pt has pCO2 of 60 - so has hypercapnic failure as well. Will give him breathing tx via bipap. Pt has CHF exacerbation per labs and Xrays as well. Will give iv lasix 80 mg.  [facility] called - they will admit.   Rate: 73  Rhythm: normal sinus rhythm  QRS Axis: left  Intervals: QT prolonged  ST/T Wave abnormalities: nonspecific ST/T changes  Conduction Disutrbances:none  Narrative Interpretation:   Old EKG Reviewed: changes noted - QT prolonged  CRITICAL CARE Performed by: Derwood Kaplan   Total critical care time: 60 minutes  Critical care time was exclusive of separately billable procedures and treating other patients.  Critical care was necessary to treat or prevent imminent or life-threatening deterioration.  Critical care was time spent personally by me on the following activities: development of treatment plan with patient and/or surrogate as well as nursing, discussions with consultants, evaluation of patient's response to treatment, examination of patient, obtaining history from patient or surrogate, ordering and performing treatments and interventions, ordering and review of laboratory studies, ordering and review of radiographic studies, pulse oximetry and re-evaluation of patient's condition.       Derwood Kaplan, MD 01/08/13 2258

## 2013-01-08 NOTE — ED Notes (Signed)
Pt diaphoretic and continues to be short of breath. MD at bedside. Pt 96% NRB. Family at bedside. Pt AO x4.

## 2013-01-08 NOTE — ED Notes (Signed)
In triage, pt's O2 sats at 55%. Placed on NRB, and increased to 95%. Pt is orthopneic, in tripod positioning. Reports some relief to SOB. Family at bedside.

## 2013-01-08 NOTE — ED Notes (Signed)
Olene Craven Daughter [phone]

## 2013-01-08 NOTE — ED Notes (Signed)
Respitory at bedside placing pt on bipap. Pt states "I feel so much better when I am standing up and trying to breathe."

## 2013-01-08 NOTE — ED Notes (Signed)
PT. REPORTS PROGRESSING EXERTIONAL DYSPNEA , CHEST CONGESTION WITH OCCASIONAL PRODUCTIVE COUGH AND LOWER LEG SWELLING ONSET TODAY . PT. STATED HISTORY OF CHF , HIS CARDIOLOGIST IS DR. KLEIN .

## 2013-01-08 NOTE — ED Notes (Signed)
PT tolerating BiPap well. Pt no longer diaphoretic, speaks in complete sentences, and 02 100%. Family at bedside.

## 2013-01-08 NOTE — ED Notes (Signed)
Pt given a reclining chair to sit in to increase comfort. Pt reports decrease in SOB. Tolerating bipap well at this time. VSS. Family at the bedside.

## 2013-01-09 ENCOUNTER — Encounter (HOSPITAL_COMMUNITY): Payer: Self-pay | Admitting: *Deleted

## 2013-01-09 DIAGNOSIS — J9600 Acute respiratory failure, unspecified whether with hypoxia or hypercapnia: Secondary | ICD-10-CM

## 2013-01-09 DIAGNOSIS — I5023 Acute on chronic systolic (congestive) heart failure: Secondary | ICD-10-CM

## 2013-01-09 DIAGNOSIS — I509 Heart failure, unspecified: Secondary | ICD-10-CM

## 2013-01-09 LAB — GLUCOSE, CAPILLARY
Glucose-Capillary: 146 mg/dL — ABNORMAL HIGH (ref 70–99)
Glucose-Capillary: 108 mg/dL — ABNORMAL HIGH (ref 70–99)
Glucose-Capillary: 143 mg/dL — ABNORMAL HIGH (ref 70–99)

## 2013-01-09 LAB — TSH: TSH: 1.522 u[IU]/mL (ref 0.350–4.500)

## 2013-01-09 MED ORDER — HYDRALAZINE HCL 20 MG/ML IJ SOLN
10.0000 mg | Freq: Four times a day (QID) | INTRAMUSCULAR | Status: DC | PRN
  Administered 2013-01-09: 10 mg via INTRAVENOUS
  Filled 2013-01-09: qty 1

## 2013-01-09 MED ORDER — FUROSEMIDE 10 MG/ML IJ SOLN
80.0000 mg | Freq: Two times a day (BID) | INTRAMUSCULAR | Status: DC
  Administered 2013-01-09 – 2013-01-10 (×4): 80 mg via INTRAVENOUS
  Filled 2013-01-09 (×7): qty 8

## 2013-01-09 MED ORDER — HEPARIN SODIUM (PORCINE) 5000 UNIT/ML IJ SOLN
5000.0000 [IU] | Freq: Three times a day (TID) | INTRAMUSCULAR | Status: DC
  Administered 2013-01-09 – 2013-01-12 (×10): 5000 [IU] via SUBCUTANEOUS
  Filled 2013-01-09 (×13): qty 1

## 2013-01-09 MED ORDER — BIOTENE DRY MOUTH MT LIQD
15.0000 mL | Freq: Two times a day (BID) | OROMUCOSAL | Status: DC
  Administered 2013-01-09 – 2013-01-12 (×7): 15 mL via OROMUCOSAL

## 2013-01-09 MED ORDER — INSULIN ASPART 100 UNIT/ML ~~LOC~~ SOLN: 0.0000 [IU] | Freq: Every day | SUBCUTANEOUS | Status: DC

## 2013-01-09 MED ORDER — ONDANSETRON HCL 4 MG/2ML IJ SOLN: 4.0000 mg | Freq: Four times a day (QID) | INTRAMUSCULAR | Status: DC | PRN

## 2013-01-09 MED ORDER — SODIUM CHLORIDE 0.9 % IJ SOLN
3.0000 mL | Freq: Two times a day (BID) | INTRAMUSCULAR | Status: DC
  Administered 2013-01-09 – 2013-01-12 (×6): 3 mL via INTRAVENOUS

## 2013-01-09 MED ORDER — SODIUM CHLORIDE 0.9 % IJ SOLN: 3.0000 mL | INTRAMUSCULAR | Status: DC | PRN

## 2013-01-09 MED ORDER — INSULIN ASPART 100 UNIT/ML ~~LOC~~ SOLN
0.0000 [IU] | Freq: Three times a day (TID) | SUBCUTANEOUS | Status: DC
  Administered 2013-01-09 – 2013-01-10 (×3): 1 [IU] via SUBCUTANEOUS
  Administered 2013-01-10 – 2013-01-12 (×3): 2 [IU] via SUBCUTANEOUS
  Administered 2013-01-12: 1 [IU] via SUBCUTANEOUS

## 2013-01-09 MED ORDER — SODIUM CHLORIDE 0.9 % IV SOLN: 250.0000 mL | INTRAVENOUS | Status: DC | PRN

## 2013-01-09 MED ORDER — INSULIN ASPART 100 UNIT/ML ~~LOC~~ SOLN
3.0000 [IU] | Freq: Three times a day (TID) | SUBCUTANEOUS | Status: DC
  Administered 2013-01-09 – 2013-01-12 (×7): 3 [IU] via SUBCUTANEOUS

## 2013-01-09 MED ORDER — ACETAMINOPHEN 325 MG PO TABS: 650.0000 mg | ORAL_TABLET | ORAL | Status: DC | PRN

## 2013-01-09 MED ORDER — LISINOPRIL 10 MG PO TABS
10.0000 mg | ORAL_TABLET | Freq: Two times a day (BID) | ORAL | Status: DC
  Administered 2013-01-09 – 2013-01-12 (×7): 10 mg via ORAL
  Filled 2013-01-09 (×8): qty 1

## 2013-01-09 MED ORDER — LABETALOL HCL 100 MG PO TABS
100.0000 mg | ORAL_TABLET | Freq: Three times a day (TID) | ORAL | Status: DC
  Administered 2013-01-09 – 2013-01-10 (×4): 100 mg via ORAL
  Filled 2013-01-09 (×7): qty 1

## 2013-01-09 NOTE — Progress Notes (Signed)
Paged Dr Terressa Koyanagi about patient's BP staying elevated and nothing ordered prn.

## 2013-01-09 NOTE — Progress Notes (Signed)
MD wanted to try pt off BiPAP to see how he tolerates. Pt on 5 LPM nasal cannula.

## 2013-01-09 NOTE — Progress Notes (Signed)
Nutrition Brief Note  Malnutrition Screening Tool result is inaccurate.  Please consult if nutrition needs are identified.  Sofiya Ezelle, MS RD LDN Clinical Inpatient Dietitian Pager: [phone] Weekend/After hours pager: [phone]   

## 2013-01-09 NOTE — Progress Notes (Signed)
SUBJECTIVE:  Admitted early this AM with acute on chronic systolic and diastolic HF.   PHYSICAL EXAM Filed Vitals:   01/09/13 0645 01/09/13 0700 01/09/13 0745 01/09/13 0920  BP: 185/73 180/56  197/61  Pulse: 59 60    Temp:   97.9 F (36.6 C)   TempSrc:   Oral   Resp: 20 19    Height:      Weight:      SpO2: 98% 97%     General:  No distress Lungs:  Bilateral basilar crackles.   Heart:  RRR Abdomen:  Positive bowel sounds, no rebound no guarding Extremities:  Moderate edema.   LABS:  Results for orders placed during the hospital encounter of 01/08/13 (from the past 24 hour(s))  PRO B NATRIURETIC PEPTIDE     Status: Abnormal   Collection Time    01/08/13  8:09 PM      Result Value Range   Pro B Natriuretic peptide (BNP) 2271.0 (*) 0 - 450 pg/mL  CBC WITH DIFFERENTIAL     Status: None   Collection Time    01/08/13  8:09 PM      Result Value Range   WBC 7.7  4.0 - 10.5 K/uL   RBC 5.14  4.22 - 5.81 MIL/uL   Hemoglobin 14.7  13.0 - 17.0 g/dL   HCT 45.4  09.8 - 11.9 %   MCV 90.1  78.0 - 100.0 fL   MCH 28.6  26.0 - 34.0 pg   MCHC 31.7  30.0 - 36.0 g/dL   RDW 14.7  82.9 - 56.2 %   Platelets 165  150 - 400 K/uL   Neutrophils Relative % 66  43 - 77 %   Neutro Abs 5.1  1.7 - 7.7 K/uL   Lymphocytes Relative 21  12 - 46 %   Lymphs Abs 1.6  0.7 - 4.0 K/uL   Monocytes Relative 10  3 - 12 %   Monocytes Absolute 0.8  0.1 - 1.0 K/uL   Eosinophils Relative 3  0 - 5 %   Eosinophils Absolute 0.2  0.0 - 0.7 K/uL   Basophils Relative 0  0 - 1 %   Basophils Absolute 0.0  0.0 - 0.1 K/uL  COMPREHENSIVE METABOLIC PANEL     Status: Abnormal   Collection Time    01/08/13  8:09 PM      Result Value Range   Sodium 138  135 - 145 mEq/L   Potassium 4.4  3.5 - 5.1 mEq/L   Chloride 102  96 - 112 mEq/L   CO2 28  19 - 32 mEq/L   Glucose, Bld 179 (*) 70 - 99 mg/dL   BUN 20  6 - 23 mg/dL   Creatinine, Ser 1.30  0.50 - 1.35 mg/dL   Calcium 9.5  8.4 - 86.5 mg/dL   Total Protein 7.8  6.0 -  8.3 g/dL   Albumin 3.9  3.5 - 5.2 g/dL   AST 17  0 - 37 U/L   ALT 10  0 - 53 U/L   Alkaline Phosphatase 82  39 - 117 U/L   Total Bilirubin 0.9  0.3 - 1.2 mg/dL   GFR calc non Af Amer 54 (*) >90 mL/min   GFR calc Af Amer 63 (*) >90 mL/min  MAGNESIUM     Status: None   Collection Time    01/08/13  8:09 PM      Result Value Range   Magnesium 1.9  1.5 -  2.5 mg/dL  POCT I-STAT TROPONIN I     Status: None   Collection Time    01/08/13  8:16 PM      Result Value Range   Troponin i, poc 0.02  0.00 - 0.08 ng/mL   Comment 3           CG4 I-STAT (LACTIC ACID)     Status: None   Collection Time    01/08/13  8:42 PM      Result Value Range   Lactic Acid, Venous 1.23  0.5 - 2.2 mmol/L  URINALYSIS, ROUTINE W REFLEX MICROSCOPIC     Status: Abnormal   Collection Time    01/08/13  8:45 PM      Result Value Range   Color, Urine YELLOW  YELLOW   APPearance HAZY (*) CLEAR   Specific Gravity, Urine 1.014  1.005 - 1.030   pH 7.0  5.0 - 8.0   Glucose, UA 100 (*) NEGATIVE mg/dL   Hgb urine dipstick MODERATE (*) NEGATIVE   Bilirubin Urine NEGATIVE  NEGATIVE   Ketones, ur NEGATIVE  NEGATIVE mg/dL   Protein, ur >409 (*) NEGATIVE mg/dL   Urobilinogen, UA 0.2  0.0 - 1.0 mg/dL   Nitrite NEGATIVE  NEGATIVE   Leukocytes, UA NEGATIVE  NEGATIVE  URINE MICROSCOPIC-ADD ON     Status: Abnormal   Collection Time    01/08/13  8:45 PM      Result Value Range   WBC, UA 3-6  <3 WBC/hpf   RBC / HPF 7-10  <3 RBC/hpf   Bacteria, UA RARE  RARE   Casts HYALINE CASTS (*) NEGATIVE  POCT I-STAT 3, BLOOD GAS (G3+)     Status: Abnormal   Collection Time    01/08/13  8:51 PM      Result Value Range   pH, Arterial 7.327 (*) 7.350 - 7.450   pCO2 arterial 59.8 (*) 35.0 - 45.0 mmHg   pO2, Arterial 125.0 (*) 80.0 - 100.0 mmHg   Bicarbonate 31.3 (*) 20.0 - 24.0 mEq/L   TCO2 33  0 - 100 mmol/L   O2 Saturation 98.0     Acid-Base Excess 3.0 (*) 0.0 - 2.0 mmol/L   Patient temperature 98.6 F     Collection site RADIAL,  ALLEN'S TEST ACCEPTABLE     Drawn by RT     Sample type ARTERIAL     Comment NOTIFIED PHYSICIAN    POCT I-STAT 3, BLOOD GAS (G3+)     Status: Abnormal   Collection Time    01/08/13 10:22 PM      Result Value Range   pH, Arterial 7.302 (*) 7.350 - 7.450   pCO2 arterial 59.5 (*) 35.0 - 45.0 mmHg   pO2, Arterial 108.0 (*) 80.0 - 100.0 mmHg   Bicarbonate 29.4 (*) 20.0 - 24.0 mEq/L   TCO2 31  0 - 100 mmol/L   O2 Saturation 97.0     Acid-Base Excess 1.0  0.0 - 2.0 mmol/L   Patient temperature 98.6 F     Collection site RADIAL, ALLEN'S TEST ACCEPTABLE     Drawn by RT     Sample type ARTERIAL     Comment NOTIFIED PHYSICIAN    MRSA PCR SCREENING     Status: None   Collection Time    01/09/13  3:37 AM      Result Value Range   MRSA by PCR NEGATIVE  NEGATIVE  GLUCOSE, CAPILLARY     Status: Abnormal   Collection  Time    01/09/13  4:07 AM      Result Value Range   Glucose-Capillary 108 (*) 70 - 99 mg/dL  MAGNESIUM     Status: None   Collection Time    01/09/13  4:15 AM      Result Value Range   Magnesium 1.8  1.5 - 2.5 mg/dL  GLUCOSE, CAPILLARY     Status: None   Collection Time    01/09/13  7:45 AM      Result Value Range   Glucose-Capillary 84  70 - 99 mg/dL    Intake/Output Summary (Last 24 hours) at 01/09/13 0945 Last data filed at 01/09/13 0932  Gross per 24 hour  Intake      0 ml  Output   1025 ml  Net  -1025 ml     ASSESSMENT AND PLAN:  ACUTE ON CHRONIC SYSTOLIC AND DIASTOLIC HF:  He feels better and is off of the BIPAP.  Continue with current IV diuresis.    HTN:  I will start a low dose of his previous labetalol.     Fayrene Fearing [facility] 01/09/2013 9:45 AM

## 2013-01-09 NOTE — Progress Notes (Signed)
No call back from Dr Terressa Koyanagi at this time.

## 2013-01-09 NOTE — H&P (Signed)
Cardiology History and Physical  Carollee Herter, MD  History of Present Illness (and review of medical records): Malik Rojas is a 79 y.o. male who presents for evaluation of shortness of breath.  He has a hx of HTN, DM2, HL, diastolic CHF, high grade HB, s/p pacer.  He has been recently evaluated for increased DOE.  He had a Myoview which was abnormal with EF 36%, apical inf scar, inf-apical HK, no ischemia. Echo was done which confirmed EF 35% with worse HK in lat, dist inf-septal and apical segments and mild diastolic dysfunction.  Cardiac catheterization revealed nonobstructive CAD.  He now presents with shortness of breath with has been progressive over the past 2-3 weeks.  He reports orthopnea and abdominal swelling.  He denies any chest pain.  He does report not being compliant with Lasix.  He was is moderate respiratory distress upon arrival requiring him to be placed on BiPAP.    Review of Systems Further review of systems was otherwise negative other than stated in HPI.  Patient Active Problem List   Diagnosis Date Noted  . Diabetes mellitus 10/01/2011  . Epistaxis 08/28/2011  . Xerostomia 08/28/2011  . HTN (hypertension), benign 08/28/2011  . Obesity (BMI 30-39.9) 05/30/2011  . Pacemaker-Medtronic 11/21/2010  . DEPRESSION/ANXIETY 08/29/2009  . OBSTRUCTIVE SLEEP APNEA 07/21/2009  . FATIGUE 07/21/2009  . EDEMA 07/21/2009  . Complete heart block 01/10/2009   Past Medical History  Diagnosis Date  . Complete heart block     Status post Medtronic pacemaker  . Diabetes mellitus   . HTN (hypertension)   . OSA (obstructive sleep apnea)   . Edema   . Obesity   . Chronic diastolic heart failure     Ejection fraction 55% with elevated end-diastolic pressure-catheterization 3/14  . HLD (hyperlipidemia)   . CKD (chronic kidney disease)   . Hx of cardiovascular stress test     a. [place] MV 3/14:  Low risk, EF 36%, apical inf scar, no ischemia, inf-apical HK>>  catheterization however demonstrated normal left ventricular function  . Cardiac pacemaker Medtronic     Past Surgical History  Procedure Laterality Date  . Pacemaker insertion      medtronic EnRhythm  . Appendectomy      Prescriptions prior to admission  Medication Sig Dispense Refill  . aspirin 81 MG tablet Take 81 mg by mouth daily.      . furosemide (LASIX) 80 MG tablet Take 80 mg by mouth 2 (two) times daily.       Marland Kitchen glimepiride (AMARYL) 1 MG tablet TAKE 1 TABLET BY MOUTH TWICE DAILY  60 tablet  3  . Omega-3 Fatty Acids (FISH OIL) 1000 MG CAPS Take 1 capsule by mouth daily.        . sitaGLIPtan-metformin (JANUMET) 50-1000 MG per tablet Take 1 tablet by mouth 2 (two) times daily with a meal.  60 tablet  3  . Solifenacin Succinate (VESICARE PO) Take 1 tablet by mouth daily.      Marland Kitchen labetalol (NORMODYNE) 300 MG tablet Take 1 tablet (300 mg total) by mouth 2 (two) times daily.  60 tablet  4  . lisinopril-hydrochlorothiazide (PRINZIDE,ZESTORETIC) 20-12.5 MG per tablet Take 1 tablet by mouth daily.  30 tablet  prn  . pravastatin (PRAVACHOL) 80 MG tablet TAKE ONE TABLET BY MOUTH ONE TIME DAILY  30 tablet  5   No Known Allergies  History  Substance Use Topics  . Smoking status: Former Smoker -- 5.00 packs/day for 12 years  Quit date: 08/05/1970  . Smokeless tobacco: Former Neurosurgeon    Quit date: 01/09/1964  . Alcohol Use: Yes     Comment: occasional    Family History  Problem Relation Age of Onset  . Leukemia Mother      Objective:  Patient Vitals for the past 8 hrs:  BP Temp Temp src Pulse Resp SpO2 Height Weight  01/09/13 0745 - 97.9 F (36.6 C) Oral - - - - -  01/09/13 0700 180/56 mmHg - - 60 19 97 % - -  01/09/13 0645 185/73 mmHg - - 59 20 98 % - -  01/09/13 0630 181/61 mmHg - - 58 19 97 % - -  01/09/13 0615 184/66 mmHg - - 59 15 98 % - -  01/09/13 0600 207/72 mmHg - - 72 19 98 % - -  01/09/13 0545 165/55 mmHg - - 59 18 97 % - -  01/09/13 0530 172/52 mmHg - - 60 17 98 %  - -  01/09/13 0515 173/57 mmHg - - 76 16 88 % - -  01/09/13 0500 165/60 mmHg - - 68 18 92 % - -  01/09/13 0445 174/55 mmHg - - 60 20 94 % - -  01/09/13 0442 - - - - - - - 104.2 kg (229 lb 11.5 oz)  01/09/13 0430 184/61 mmHg - - 66 20 96 % - -  01/09/13 0415 184/54 mmHg - - 59 17 98 % - -  01/09/13 0400 175/54 mmHg 98.2 F (36.8 C) Oral 60 18 98 % - -  01/09/13 0355 - - - - - - 5\' 8"  (1.727 m) 104.12 kg (229 lb 8.7 oz)  01/09/13 0300 160/58 mmHg - - 60 - 96 % - -  01/09/13 0240 157/58 mmHg - - 78 19 - - -  01/09/13 0150 157/55 mmHg - - 60 18 100 % - -  01/09/13 0100 159/56 mmHg - - 60 18 100 % - -  01/09/13 0034 161/55 mmHg - - - 20 99 % - -   General appearance: alert, cooperative, mild distress and morbidly obese Head: Normocephalic, without obvious abnormality, atraumatic Eyes: conjunctivae/corneas clear. PERRL, EOM's intact. Fundi benign. Neck: supple, symmetrical, trachea midline Lungs: diminished breath sounds base - right Heart: regular rate and rhythm Abdomen: soft, non-tender; bowel sounds normal; no masses,  no organomegaly Extremities: edema bilaterally Neurologic: Grossly normal  Results for orders placed during the hospital encounter of 01/08/13 (from the past 48 hour(s))  PRO B NATRIURETIC PEPTIDE     Status: Abnormal   Collection Time    01/08/13  8:09 PM      Result Value Range   Pro B Natriuretic peptide (BNP) 2271.0 (*) 0 - 450 pg/mL  CBC WITH DIFFERENTIAL     Status: None   Collection Time    01/08/13  8:09 PM      Result Value Range   WBC 7.7  4.0 - 10.5 K/uL   RBC 5.14  4.22 - 5.81 MIL/uL   Hemoglobin 14.7  13.0 - 17.0 g/dL   HCT 16.1  09.6 - 04.5 %   MCV 90.1  78.0 - 100.0 fL   MCH 28.6  26.0 - 34.0 pg   MCHC 31.7  30.0 - 36.0 g/dL   RDW 40.9  81.1 - 91.4 %   Platelets 165  150 - 400 K/uL   Neutrophils Relative % 66  43 - 77 %   Neutro Abs 5.1  1.7 - 7.7 K/uL  Lymphocytes Relative 21  12 - 46 %   Lymphs Abs 1.6  0.7 - 4.0 K/uL   Monocytes  Relative 10  3 - 12 %   Monocytes Absolute 0.8  0.1 - 1.0 K/uL   Eosinophils Relative 3  0 - 5 %   Eosinophils Absolute 0.2  0.0 - 0.7 K/uL   Basophils Relative 0  0 - 1 %   Basophils Absolute 0.0  0.0 - 0.1 K/uL  COMPREHENSIVE METABOLIC PANEL     Status: Abnormal   Collection Time    01/08/13  8:09 PM      Result Value Range   Sodium 138  135 - 145 mEq/L   Potassium 4.4  3.5 - 5.1 mEq/L   Chloride 102  96 - 112 mEq/L   CO2 28  19 - 32 mEq/L   Glucose, Bld 179 (*) 70 - 99 mg/dL   BUN 20  6 - 23 mg/dL   Creatinine, Ser 4.54  0.50 - 1.35 mg/dL   Calcium 9.5  8.4 - 09.8 mg/dL   Total Protein 7.8  6.0 - 8.3 g/dL   Albumin 3.9  3.5 - 5.2 g/dL   AST 17  0 - 37 U/L   ALT 10  0 - 53 U/L   Alkaline Phosphatase 82  39 - 117 U/L   Total Bilirubin 0.9  0.3 - 1.2 mg/dL   GFR calc non Af Amer 54 (*) >90 mL/min   GFR calc Af Amer 63 (*) >90 mL/min   Comment:            The eGFR has been calculated     using the CKD EPI equation.     This calculation has not been     validated in all clinical     situations.     eGFR's persistently     <90 mL/min signify     possible Chronic Kidney Disease.  MAGNESIUM     Status: None   Collection Time    01/08/13  8:09 PM      Result Value Range   Magnesium 1.9  1.5 - 2.5 mg/dL  POCT I-STAT TROPONIN I     Status: None   Collection Time    01/08/13  8:16 PM      Result Value Range   Troponin i, poc 0.02  0.00 - 0.08 ng/mL   Comment 3            Comment: Due to the release kinetics of cTnI,     a negative result within the first hours     of the onset of symptoms does not rule out     myocardial infarction with certainty.     If myocardial infarction is still suspected,     repeat the test at appropriate intervals.  CG4 I-STAT (LACTIC ACID)     Status: None   Collection Time    01/08/13  8:42 PM      Result Value Range   Lactic Acid, Venous 1.23  0.5 - 2.2 mmol/L  URINALYSIS, ROUTINE W REFLEX MICROSCOPIC     Status: Abnormal   Collection Time     01/08/13  8:45 PM      Result Value Range   Color, Urine YELLOW  YELLOW   APPearance HAZY (*) CLEAR   Specific Gravity, Urine 1.014  1.005 - 1.030   pH 7.0  5.0 - 8.0   Glucose, UA 100 (*) NEGATIVE mg/dL   Hgb urine dipstick  MODERATE (*) NEGATIVE   Bilirubin Urine NEGATIVE  NEGATIVE   Ketones, ur NEGATIVE  NEGATIVE mg/dL   Protein, ur >161 (*) NEGATIVE mg/dL   Urobilinogen, UA 0.2  0.0 - 1.0 mg/dL   Nitrite NEGATIVE  NEGATIVE   Leukocytes, UA NEGATIVE  NEGATIVE  URINE MICROSCOPIC-ADD ON     Status: Abnormal   Collection Time    01/08/13  8:45 PM      Result Value Range   WBC, UA 3-6  <3 WBC/hpf   RBC / HPF 7-10  <3 RBC/hpf   Bacteria, UA RARE  RARE   Casts HYALINE CASTS (*) NEGATIVE  POCT I-STAT 3, BLOOD GAS (G3+)     Status: Abnormal   Collection Time    01/08/13  8:51 PM      Result Value Range   pH, Arterial 7.327 (*) 7.350 - 7.450   pCO2 arterial 59.8 (*) 35.0 - 45.0 mmHg   pO2, Arterial 125.0 (*) 80.0 - 100.0 mmHg   Bicarbonate 31.3 (*) 20.0 - 24.0 mEq/L   TCO2 33  0 - 100 mmol/L   O2 Saturation 98.0     Acid-Base Excess 3.0 (*) 0.0 - 2.0 mmol/L   Patient temperature 98.6 F     Collection site RADIAL, ALLEN'S TEST ACCEPTABLE     Drawn by RT     Sample type ARTERIAL     Comment NOTIFIED PHYSICIAN    POCT I-STAT 3, BLOOD GAS (G3+)     Status: Abnormal   Collection Time    01/08/13 10:22 PM      Result Value Range   pH, Arterial 7.302 (*) 7.350 - 7.450   pCO2 arterial 59.5 (*) 35.0 - 45.0 mmHg   pO2, Arterial 108.0 (*) 80.0 - 100.0 mmHg   Bicarbonate 29.4 (*) 20.0 - 24.0 mEq/L   TCO2 31  0 - 100 mmol/L   O2 Saturation 97.0     Acid-Base Excess 1.0  0.0 - 2.0 mmol/L   Patient temperature 98.6 F     Collection site RADIAL, ALLEN'S TEST ACCEPTABLE     Drawn by RT     Sample type ARTERIAL     Comment NOTIFIED PHYSICIAN    MRSA PCR SCREENING     Status: None   Collection Time    01/09/13  3:37 AM      Result Value Range   MRSA by PCR NEGATIVE  NEGATIVE    Comment:            The GeneXpert MRSA Assay (FDA     approved for NASAL specimens     only), is one component of a     comprehensive MRSA colonization     surveillance program. It is not     intended to diagnose MRSA     infection nor to guide or     monitor treatment for     MRSA infections.  GLUCOSE, CAPILLARY     Status: Abnormal   Collection Time    01/09/13  4:07 AM      Result Value Range   Glucose-Capillary 108 (*) 70 - 99 mg/dL  MAGNESIUM     Status: None   Collection Time    01/09/13  4:15 AM      Result Value Range   Magnesium 1.8  1.5 - 2.5 mg/dL  GLUCOSE, CAPILLARY     Status: None   Collection Time    01/09/13  7:45 AM      Result Value Range  Glucose-Capillary 84  70 - 99 mg/dL   Dg Chest Portable 1 View  01/08/2013   *RADIOLOGY REPORT*  Clinical Data: Shortness of breath.  Congestive heart failure.  PORTABLE CHEST - 1 VIEW  Comparison: Chest x-ray 09/28/2012.  Findings: There is cephalization of the pulmonary vasculature, indistinctness of the interstitial markings, and patchy airspace disease throughout the lungs bilaterally suggestive of moderate pulmonary edema.  Small bilateral pleural effusions.  Mild cardiomegaly.  Upper mediastinal contours are within normal limits given the lordotic positioning and low lung volumes.  Left-sided pacemaker device in place with lead tips projecting over the expected location of the right atrium and right ventricular apex.  IMPRESSION: 1.  Findings, as above, concerning for congestive heart failure.   Original Report Authenticated By: Trudie Reed, M.D.    ECG:  HR 95 atrial sensed v paced  Assessment: Acute on chronic systolic heart failure Respiratory failure requiring BIPAP  Plan: 1. Cardiology Admission to stepdown 2. Continuous monitoring on Telemetry. 3. Repeat ekg on admit, prn chest pain or arrythmia 4. Continue IV diuresis, strict I/Os, daily weights 5. Continue BIPAP, recheck ABG 6. Had recent TTE. 7.   Continue ACEi, Hydralazine prn for BP control.

## 2013-01-09 NOTE — Progress Notes (Signed)
Patient refuse to have a foley cath placed.

## 2013-01-09 NOTE — Progress Notes (Signed)
Paged Dr Terressa Koyanagi second time due to patient's BP being 207/72 now.

## 2013-01-10 LAB — BASIC METABOLIC PANEL
CO2: 33 mEq/L — ABNORMAL HIGH (ref 19–32)
Chloride: 97 mEq/L (ref 96–112)
Creatinine, Ser: 1.34 mg/dL (ref 0.50–1.35)
GFR calc Af Amer: 57 mL/min — ABNORMAL LOW (ref >90)
Sodium: 139 mEq/L (ref 135–145)

## 2013-01-10 LAB — GLUCOSE, CAPILLARY: Glucose-Capillary: 127 mg/dL — ABNORMAL HIGH (ref 70–99)

## 2013-01-10 MED ORDER — LABETALOL HCL 200 MG PO TABS
200.0000 mg | ORAL_TABLET | Freq: Three times a day (TID) | ORAL | Status: DC
  Administered 2013-01-10 – 2013-01-12 (×6): 200 mg via ORAL
  Filled 2013-01-10 (×8): qty 1

## 2013-01-10 NOTE — Progress Notes (Signed)
WHEN PASSING MEDS, IT WAS NOTED THAT PATIENT'S SPO2 WAS HIGH 80'S-LOW 90'S WHILE ON ROOM AIR.  PATIENT DENIED ANY SOB, STATED HE FORGOT TO PUT HIS OXYGEN BACK ON.  PLACED PATIENT BACK ON O2 3L VIA NASAL CANULA, SPO2 RETURNED TO MID-HIGH 90'S.  ORDERED CONTINUOUS PULSE OX FOR PATIENT TO BETTER MONITOR SPO2.  WILL CONTINUE TO MONITOR. Troy Sine

## 2013-01-10 NOTE — Progress Notes (Signed)
Pt resting in bed. O2 in place to nares at 3 lit via n/c.VSS , no c/o note,d call bell in reach.

## 2013-01-10 NOTE — Progress Notes (Signed)
   SUBJECTIVE:  He says he went "plumb crazy" last night but I see no mention of this.  Denies SOB   PHYSICAL EXAM Filed Vitals:   01/10/13 0755 01/10/13 0800 01/10/13 0900 01/10/13 1000  BP: 150/52 126/77    Pulse: 60 60 74 73  Temp: 98 F (36.7 C)     TempSrc: Oral     Resp: 17 19 19 16   Height:      Weight:      SpO2: 98% 97% 97% 100%   General:  No distress Neck:  Difficult to assess JVD Lungs:  Bilateral basilar crackles.   Heart:  RRR Abdomen:  Positive bowel sounds, no rebound no guarding Extremities:  Moderate edema.   LABS:  Results for orders placed during the hospital encounter of 01/08/13 (from the past 24 hour(s))  GLUCOSE, CAPILLARY     Status: Abnormal   Collection Time    01/09/13 12:40 PM      Result Value Range   Glucose-Capillary 146 (*) 70 - 99 mg/dL  GLUCOSE, CAPILLARY     Status: Abnormal   Collection Time    01/09/13  4:42 PM      Result Value Range   Glucose-Capillary 108 (*) 70 - 99 mg/dL  GLUCOSE, CAPILLARY     Status: Abnormal   Collection Time    01/09/13  9:29 PM      Result Value Range   Glucose-Capillary 143 (*) 70 - 99 mg/dL  BASIC METABOLIC PANEL     Status: Abnormal   Collection Time    01/10/13  4:18 AM      Result Value Range   Sodium 139  135 - 145 mEq/L   Potassium 3.5  3.5 - 5.1 mEq/L   Chloride 97  96 - 112 mEq/L   CO2 33 (*) 19 - 32 mEq/L   Glucose, Bld 123 (*) 70 - 99 mg/dL   BUN 24 (*) 6 - 23 mg/dL   Creatinine, Ser 1.61  0.50 - 1.35 mg/dL   Calcium 9.2  8.4 - 09.6 mg/dL   GFR calc non Af Amer 49 (*) >90 mL/min   GFR calc Af Amer 57 (*) >90 mL/min  GLUCOSE, CAPILLARY     Status: Abnormal   Collection Time    01/10/13  7:59 AM      Result Value Range   Glucose-Capillary 127 (*) 70 - 99 mg/dL    Intake/Output Summary (Last 24 hours) at 01/10/13 1032 Last data filed at 01/10/13 0900  Gross per 24 hour  Intake    240 ml  Output   2525 ml  Net  -2285 ml    ASSESSMENT AND PLAN:  ACUTE ON CHRONIC SYSTOLIC AND  DIASTOLIC HF:  Good UO yesterday.  Continue IV Lasix today.  He is eating salty crackers and doesn't keep his feet.    HTN:  I restarted a low dose of labetalol yesterday.  I will increase this today as BP still running high.  This will still be lower than home dose for now.  HR is marginalRollene Rotunda 01/10/2013 10:32 AM

## 2013-01-11 LAB — BASIC METABOLIC PANEL
BUN: 22 mg/dL (ref 6–23)
CO2: 33 mEq/L — ABNORMAL HIGH (ref 19–32)
Calcium: 9.2 mg/dL (ref 8.4–10.5)
Chloride: 98 mEq/L (ref 96–112)
Creatinine, Ser: 1.27 mg/dL (ref 0.50–1.35)
Glucose, Bld: 111 mg/dL — ABNORMAL HIGH (ref 70–99)

## 2013-01-11 LAB — GLUCOSE, CAPILLARY
Glucose-Capillary: 107 mg/dL — ABNORMAL HIGH (ref 70–99)
Glucose-Capillary: 116 mg/dL — ABNORMAL HIGH (ref 70–99)

## 2013-01-11 MED ORDER — POTASSIUM CHLORIDE CRYS ER 20 MEQ PO TBCR
40.0000 meq | EXTENDED_RELEASE_TABLET | Freq: Two times a day (BID) | ORAL | Status: DC
  Administered 2013-01-11 – 2013-01-12 (×3): 40 meq via ORAL
  Filled 2013-01-11 (×2): qty 2

## 2013-01-11 MED ORDER — FUROSEMIDE 10 MG/ML IJ SOLN
120.0000 mg | Freq: Two times a day (BID) | INTRAVENOUS | Status: DC
  Administered 2013-01-11 – 2013-01-12 (×3): 120 mg via INTRAVENOUS
  Filled 2013-01-11 (×4): qty 12

## 2013-01-11 MED ORDER — POTASSIUM CHLORIDE 20 MEQ PO PACK
40.0000 meq | PACK | Freq: Two times a day (BID) | ORAL | Status: DC
  Filled 2013-01-11 (×2): qty 2

## 2013-01-11 NOTE — Progress Notes (Signed)
Utilization Review Completed.   Lillybeth Tal, RN, BSN Nurse Case Manager  [phone]  

## 2013-01-11 NOTE — Progress Notes (Signed)
Patient: Malik Rojas Date of Encounter: 01/11/2013, 6:59 AM Admit date: 01/08/2013     Subjective  Mr. Malik Rojas has no new complaints this AM. He denies CP, SOB or palpitations.    Objective  Physical Exam: Vitals: BP 145/67  Pulse 63  Temp(Src) 98.2 F (36.8 C) (Oral)  Resp 22  Ht 5\' 8"  (1.727 m)  Wt 215 lb 13.3 oz (97.9 kg)  BMI 32.82 kg/m2  SpO2 97% General: Well developed, elderly appearing 79 year old male in no acute distress.falling asleep in chair Neck: Supple. JVP 8-10 Lungs: Clear bilaterally to auscultation without wheezes, rales, or rhonchi. Breathing is unlabored. Heart: RRR S1 S2 without murmurs, rubs, or gallops.  Abdomen: Soft, non-distended. Extremities: No clubbing or cyanosis. 1-2+ edema. Ichthyosis on LE bilaterally. Distal pedal pulses are 2+ and equal bilaterally. Neuro: Alert and oriented X 3. Moves all extremities spontaneously. No focal deficits.  Intake/Output:  Intake/Output Summary (Last 24 hours) at 01/11/13 0659 Last data filed at 01/10/13 2300  Gross per 24 hour  Intake    960 ml  Output   1050 ml  Net    -90 ml    Inpatient Medications:  . antiseptic oral rinse  15 mL Mouth Rinse BID  . furosemide  80 mg Intravenous BID  . heparin  5,000 Units Subcutaneous Q8H  . insulin aspart  0-5 Units Subcutaneous QHS  . insulin aspart  0-9 Units Subcutaneous TID WC  . insulin aspart  3 Units Subcutaneous TID WC  . labetalol  200 mg Oral TID  . lisinopril  10 mg Oral BID  . sodium chloride  3 mL Intravenous Q12H    Labs:  Recent Labs  01/08/13 2009 01/09/13 0415 01/10/13 0418 01/11/13 0350  NA 138  --  139 139  K 4.4  --  3.5 3.4*  CL 102  --  97 98  CO2 28  --  33* 33*  GLUCOSE 179*  --  123* 111*  BUN 20  --  24* 22  CREATININE 1.23  --  1.34 1.27  CALCIUM 9.5  --  9.2 9.2  MG 1.9 1.8  --   --     Recent Labs  01/08/13 2009  AST 17  ALT 10  ALKPHOS 82  BILITOT 0.9  PROT 7.8  ALBUMIN 3.9    Recent Labs  01/08/13 2009  WBC 7.7  NEUTROABS 5.1  HGB 14.7  HCT 46.3  MCV 90.1  PLT 165    Recent Labs  01/09/13 0415  TSH 1.522    Radiology/Studies: Dg Chest Portable 1 View  01/08/2013   *RADIOLOGY REPORT*  Clinical Data: Shortness of breath.  Congestive heart failure.  PORTABLE CHEST - 1 VIEW  Comparison: Chest x-ray 09/28/2012.  Findings: There is cephalization of the pulmonary vasculature, indistinctness of the interstitial markings, and patchy airspace disease throughout the lungs bilaterally suggestive of moderate pulmonary edema.  Small bilateral pleural effusions.  Mild cardiomegaly.  Upper mediastinal contours are within normal limits given the lordotic positioning and low lung volumes.  Left-sided pacemaker device in place with lead tips projecting over the expected location of the right atrium and right ventricular apex.  IMPRESSION: 1.  Findings, as above, concerning for congestive heart failure.   Original Report Authenticated By: Trudie Reed, M.D.    Echocardiogram: March 2014 Study Conclusions - Left ventricle: LVEF is approximately 35% with diffuse hypokinesis worse in the inferior, posterior, lateral wall, distal inferoseptal and apex. Doppler parameters  are consistent with abnormal left ventricular relaxation (grade 1 diastolic dysfunction). - Mitral valve: Mild regurgitation. - Left atrium: The atrium was moderately dilated. - Pulmonary arteries: PA peak pressure: 45mm Hg (S).   Telemetry: AV paced    Assessment and Plan  1. Acute on chronic systolic HF - transition to PO lasix today and ambulate 2. Nonischemic CM, EF 35% - continue medical therapy; ? change BB to carvedilol (labetalol was added over the weekend for BP)EF by cath 60 %  3. HTN 4. Nonobstructive CAD %. DM Hypokalemia  Dr. Graciela Husbands to see Signed, EDMISTEN, BROOKE PA-C  Continue IV diuresis one more day>>120 mg and anticipate transitition to PO in am and home on wed Replete K Change diet

## 2013-01-11 NOTE — Progress Notes (Signed)
Patient evaluated for long-term disease management services with [facility] as a benefit of his [employer]. He reports he lives with his wife at home and the plan is to return home upon discharge. He could benefit from home health services upon discharge as well. Explained [facility] Care Management services to patient and consents were signed. Patient will receive a post discharge transition of care call and will be evaluated for monthly home visits for assessments and for education. Will update inpatient RNCM about meeting with patient.      Raiford Noble, MSN-Ed, RN,BSN, [facility], [phone]

## 2013-01-12 DIAGNOSIS — I5032 Chronic diastolic (congestive) heart failure: Secondary | ICD-10-CM | POA: Diagnosis present

## 2013-01-12 LAB — GLUCOSE, CAPILLARY: Glucose-Capillary: 121 mg/dL — ABNORMAL HIGH (ref 70–99)

## 2013-01-12 LAB — BASIC METABOLIC PANEL
BUN: 27 mg/dL — ABNORMAL HIGH (ref 6–23)
GFR calc Af Amer: 51 mL/min — ABNORMAL LOW (ref >90)
GFR calc non Af Amer: 44 mL/min — ABNORMAL LOW (ref >90)
Potassium: 4.3 mEq/L (ref 3.5–5.1)
Sodium: 140 mEq/L (ref 135–145)

## 2013-01-12 MED ORDER — FUROSEMIDE 80 MG PO TABS: 80.0000 mg | ORAL_TABLET | Freq: Every day | ORAL | Status: DC

## 2013-01-12 MED ORDER — LISINOPRIL 10 MG PO TABS: 10.0000 mg | ORAL_TABLET | Freq: Two times a day (BID) | ORAL | Status: DC

## 2013-01-12 MED ORDER — POTASSIUM CHLORIDE CRYS ER 20 MEQ PO TBCR: 20.0000 meq | EXTENDED_RELEASE_TABLET | Freq: Every day | ORAL | Status: DC

## 2013-01-12 MED ORDER — FUROSEMIDE 80 MG PO TABS
80.0000 mg | ORAL_TABLET | Freq: Two times a day (BID) | ORAL | Status: DC
  Filled 2013-01-12 (×3): qty 1

## 2013-01-12 MED ORDER — POTASSIUM CHLORIDE CRYS ER 20 MEQ PO TBCR: 20.0000 meq | EXTENDED_RELEASE_TABLET | Freq: Two times a day (BID) | ORAL | Status: DC

## 2013-01-12 MED ORDER — FUROSEMIDE 80 MG PO TABS: 80.0000 mg | ORAL_TABLET | Freq: Two times a day (BID) | ORAL | Status: DC

## 2013-01-12 NOTE — Discharge Summary (Signed)
CARDIOLOGY DISCHARGE SUMMARY   Patient ID: Malik Rojas MRN: [id number] DOB/AGE: February 10, 1932 79 y.o.  Admit date: 01/08/2013 Discharge date: 01/12/2013  Primary Discharge Diagnosis: Acute on chronic systolic and diastolic heart failure, NYHA class 4  Secondary Discharge Diagnosis:    Complete heart block   HTN (hypertension), benign   Diabetes mellitus   Hypokalemia  Procedures:  Chest x-ray  Hospital Course: Elwyn Klosinski is a 79 y.o. male with no history of CAD. He was cathed in March 2014, but it showed no significant coronary artery disease. His EF had previously been 35% with diastolic dysfunction by echo but was 50-55% at cath. He had increasing shortness of breath and weight gain. He was also noncompliant with Lasix. He came to the hospital where he he was placed on BiPAP and admitted for further evaluation and treatment.  He was diuresed with IV Lasix. His weight decreased by 16 pounds during his hospital stay. With improvement in his volume status, his respiratory status improved. By discharge, his oxygen saturation was 92% on room air. His labs were followed closely. There was an increase in his BUN and creatinine with diuresis and this will be followed as an outpatient. His potassium level decreased with diuresis and was supplemented as needed. His blood sugars were managed with a combination of sliding scale insulin and his home medications. His telemetry was reviewed during his hospital stay and showed sinus rhythm with ventricular pacing. He was counseled on compliance with sodium restrictions and taking his medications.  By 01/12/2013, his respiratory status was felt to be at baseline. He was ambulating well and Dr. Graciela Husbands considered him stable for discharge, to followup as an outpatient. Home Health RN to get a BMET with weight checks early next week.   Labs:   Lab Results  Component Value Date   WBC 7.7 01/08/2013   HGB 14.7 01/08/2013   HCT 46.3 01/08/2013   MCV 90.1  01/08/2013   PLT 165 01/08/2013    Recent Labs Lab 01/08/13 2009  01/12/13 0430  NA 138  < > 140  K 4.4  < > 4.3  CL 102  < > 98  CO2 28  < > 36*  BUN 20  < > 27*  CREATININE 1.23  < > 1.47*  CALCIUM 9.5  < > 9.5  PROT 7.8  --   --   BILITOT 0.9  --   --   ALKPHOS 82  --   --   ALT 10  --   --   AST 17  --   --   GLUCOSE 179*  < > 112*  < > = values in this interval not displayed.  Pro B Natriuretic peptide (BNP)  Date/Time Value Range Status  01/08/2013  8:09 PM 2271.0* 0 - 450 pg/mL Final  11/26/2010  5:00 AM <30.0  0.0 - 100.0 pg/mL Final     Radiology: Dg Chest Portable 1 View 01/08/2013   *RADIOLOGY REPORT*  Clinical Data: Shortness of breath.  Congestive heart failure.  PORTABLE CHEST - 1 VIEW  Comparison: Chest x-ray 09/28/2012.  Findings: There is cephalization of the pulmonary vasculature, indistinctness of the interstitial markings, and patchy airspace disease throughout the lungs bilaterally suggestive of moderate pulmonary edema.  Small bilateral pleural effusions.  Mild cardiomegaly.  Upper mediastinal contours are within normal limits given the lordotic positioning and low lung volumes.  Left-sided pacemaker device in place with lead tips projecting over the expected location of the right  atrium and right ventricular apex.  IMPRESSION: 1.  Findings, as above, concerning for congestive heart failure.   Original Report Authenticated By: Trudie Reed, M.D.   EKG:08-Jan-2013 20:00:59 [facility]/ED ROUTINE RECORD SINUS RHYTHM ~ normal P axis, V-rate 50- 99 LVH WITH IVCD, LAD AND SECONDARY REPOL ABNRM ~ RISIII/S12R56, wQRS, LAD, rep abn Standard 12 Lead Report ~ Unconfirmed Interpretation Abnormal ECG 18mm/s 94mm/mV 150Hz  8.0.1 12SL 235 CID: [id number] Referred by: Unconfirmed Vent. rate 95 BPM PR interval 180 ms QRS duration 180 ms QT/QTc 436/548 ms P-R-T axes 90 -75 96  FOLLOW UP PLANS AND APPOINTMENTS No Known Allergies   Medication List    TAKE these  medications       aspirin 81 MG tablet  Take 81 mg by mouth daily.     Fish Oil 1000 MG Caps  Take 1 capsule by mouth daily.     furosemide 80 MG tablet  Commonly known as:  LASIX  Take 1 tablet (80 mg total) by mouth daily.     glimepiride 1 MG tablet  Commonly known as:  AMARYL  TAKE 1 TABLET BY MOUTH TWICE DAILY     labetalol 300 MG tablet  Commonly known as:  NORMODYNE  Take 1 tablet (300 mg total) by mouth 2 (two) times daily.     lisinopril 10 MG tablet  Commonly known as:  PRINIVIL,ZESTRIL  Take 1 tablet (10 mg total) by mouth 2 (two) times daily.     potassium chloride SA 20 MEQ tablet  Commonly known as:  K-DUR,KLOR-CON  Take 1 tablet (20 mEq total) by mouth daily.     pravastatin 80 MG tablet  Commonly known as:  PRAVACHOL  TAKE ONE TABLET BY MOUTH ONE TIME DAILY     sitaGLIPtan-metformin 50-1000 MG per tablet  Commonly known as:  JANUMET  Take 1 tablet by mouth 2 (two) times daily with a meal.     tamsulosin 0.4 MG Caps  Commonly known as:  FLOMAX  Take 0.4 mg by mouth daily.     VESICARE PO  Take 5 mg by mouth daily.        Discharge Orders   Future Appointments Provider Department Dept Phone   01/20/2013 10:00 AM [facility] [employer] Main Office [place]) [phone]   01/26/2013 11:15 AM Ronnald Nian, MD [facility] [phone]   01/28/2013 11:45 AM [facility] [employer] Main Office [place]) [phone]   01/28/2013 12:10 PM Lorin Picket Moishe Spice, PA-C [facility] Main Office [place]) [phone]   Future Orders Complete By Expires     (HEART FAILURE PATIENTS) Call MD:  Anytime you have any of the following symptoms: 1) 3 pound weight gain in 24 hours or 5 pounds in 1 week 2) shortness of breath, with or without a dry hacking cough 3) swelling in the hands, feet or stomach 4) if you have to sleep on extra pillows at night in order to breathe.  As directed     Diet - low sodium heart healthy  As directed       Diet Carb Modified  As directed     Increase activity slowly  As directed       Follow-up Information   Follow up with [facility]. [facility] Nurse)    Contact information:   [phone]      Follow up with Tereso Newcomer, PA-C On 01/28/2013. (Lab work at 11:45 am (OK to take am meds and eat). See PA  for Dr Graciela Husbands at 12:10 pm)    Contact information:   [address] [place] [address] [phone]       BRING ALL MEDICATIONS WITH YOU TO FOLLOW UP APPOINTMENTS  Time spent with patient to include physician time: 42 min Signed: Theodore Demark, PA-C 01/12/2013, 1:16 PM Co-Sign MD

## 2013-01-12 NOTE — Progress Notes (Signed)
All d/c instructions explained as given to pt.  Verbalized understanding.  D/C off floor via w/c. To daughter's transport.  Shareef Eddinger,RN.

## 2013-01-12 NOTE — Progress Notes (Signed)
Noted pt self removed his own iv in attempt to go home .  LUA iv site WNL.  No bleeding or swelling noted.  Awaiting return call from Trish with [facility]  For d/c instructions.  Amanda Pea, Charity fundraiser.

## 2013-01-12 NOTE — Progress Notes (Signed)
Patient: Malik Rojas Date of Encounter: 01/12/2013, 8:31 AM Admit date: 01/08/2013     Subjective  Malik Rojas has no new complaints this AM. He denies CP, SOB or palpitations.    Objective  Physical Exam: Vitals: BP 147/60  Pulse 62  Temp(Src) 97.9 F (36.6 C) (Oral)  Resp 20  Ht 5\' 8"  (1.727 m)  Wt 213 lb 3 oz (96.7 kg)  BMI 32.42 kg/m2  SpO2 92% General: Well developed, elderly appearing 79 year old male in no acute distress.  Neck: Supple. JVP 8 Lungs: Clear bilaterally to auscultation without wheezes, rales, or rhonchi. Breathing is unlabored. Heart: RRR S1 S2 without murmurs, rubs, or gallops.  Abdomen: Soft, non-distended. Extremities: No clubbing or cyanosis. 1-  edema. Ichthyosis on LE bilaterally. Distal pedal pulses are 2+ and equal bilaterally. Neuro: Alert and oriented X 3. Moves all extremities spontaneously. No focal deficits.  Intake/Output:  Intake/Output Summary (Last 24 hours) at 01/12/13 0831 Last data filed at 01/12/13 0100  Gross per 24 hour  Intake    552 ml  Output   1550 ml  Net   -998 ml    Inpatient Medications:  . antiseptic oral rinse  15 mL Mouth Rinse BID  . furosemide  120 mg Intravenous BID  . heparin  5,000 Units Subcutaneous Q8H  . insulin aspart  0-5 Units Subcutaneous QHS  . insulin aspart  0-9 Units Subcutaneous TID WC  . insulin aspart  3 Units Subcutaneous TID WC  . labetalol  200 mg Oral TID  . lisinopril  10 mg Oral BID  . potassium chloride  40 mEq Oral BID  . sodium chloride  3 mL Intravenous Q12H    Labs:  Recent Labs  01/11/13 0350 01/12/13 0430  NA 139 140  K 3.4* 4.3  CL 98 98  CO2 33* 36*  GLUCOSE 111* 112*  BUN 22 27*  CREATININE 1.27 1.47*  CALCIUM 9.2 9.5   No results found for this basename: AST, ALT, ALKPHOS, BILITOT, PROT, ALBUMIN,  in the last 72 hours No results found for this basename: WBC, NEUTROABS, HGB, HCT, MCV, PLT,  in the last 72 hours No results found for this basename: TSH,  T4TOTAL, FREET3, T3FREE, THYROIDAB,  in the last 72 hours  Radiology/Studies: Dg Chest Portable 1 View  01/08/2013   *RADIOLOGY REPORT*  Clinical Data: Shortness of breath.  Congestive heart failure.  PORTABLE CHEST - 1 VIEW  Comparison: Chest x-ray 09/28/2012.  Findings: There is cephalization of the pulmonary vasculature, indistinctness of the interstitial markings, and patchy airspace disease throughout the lungs bilaterally suggestive of moderate pulmonary edema.  Small bilateral pleural effusions.  Mild cardiomegaly.  Upper mediastinal contours are within normal limits given the lordotic positioning and low lung volumes.  Left-sided pacemaker device in place with lead tips projecting over the expected location of the right atrium and right ventricular apex.  IMPRESSION: 1.  Findings, as above, concerning for congestive heart failure.   Original Report Authenticated By: Trudie Reed, M.D.    Echocardiogram: March 2014 Study Conclusions - Left ventricle: LVEF is approximately 35% with diffuse hypokinesis worse in the inferior, posterior, lateral wall, distal inferoseptal and apex. Doppler parameters are consistent with abnormal left ventricular relaxation (grade 1 diastolic dysfunction). - Mitral valve: Mild regurgitation. - Left atrium: The atrium was moderately dilated. - Pulmonary arteries: PA peak pressure: 45mm Hg (S).   Telemetry: AV paced    Assessment and Plan  1. Acute on  chronic systolic HF - transition to PO lasix today and ambulate 2. Nonischemic CM, EF 35% - continue medical therapy; ? change BB to carvedilol (labetalol was added over the weekend for BP)EF by cath 60 %  3. HTN 4. Nonobstructive CAD %. DM Hypokalemia--resolved  Dr. Graciela Husbands to see Signed, Sherryl Manges PA-C  Will change lasix to po today  Home today  Change diet

## 2013-01-12 NOTE — Progress Notes (Signed)
Pt  Took cardiac monitor off and got dressed .  States "I'm going home".  Dr. Graciela Husbands informed and instructed that he had talked  Trish,  Regarding orders to d/c pt.   Family members made aware.   Amanda Pea, Charity fundraiser.

## 2013-01-12 NOTE — Clinical Documentation Improvement (Signed)
  DOCUMENTATION CLARIFICATIONS ARE NOT PART OF THE PERMANENT MEDICAL RECORD   Please update your documentation within the medical record to reflect your response to this query.                                                                                          01/12/13   Theodore Demark, PA   In a better effort to capture your patient's severity of illness, reflect appropriate length of stay and utilization of resources, a review of the patient medical record has revealed the following conflicting information:    Acute on chronic systolic heart failure H&P signed by Pelbreton C. Terressa Koyanagi, MD at 01/09/2013 8:42 AM   ACUTE ON CHRONIC SYSTOLIC AND DIASTOLIC HF:  Progress Notes signed by Rollene Rotunda, MD at 01/09/2013 9:55 AM   ACUTE ON CHRONIC SYSTOLIC AND DIASTOLIC HF: Progress Notes signed by Rollene Rotunda, MD at 01/10/2013 10:42 AM   1. Acute on chronic systolic HF - transition to PO lasix today and ambulate  2. Nonischemic CM, EF 35% - continue medical therapy; ? change BB to carvedilol (labetalol was added over the weekend for BP)EF by cath 60 %  Progress Notes signed by Duke Salvia, MD at 01/11/2013 7:52 AM   1. Acute on chronic systolic HF - transition to PO lasix today and ambulate  2. Nonischemic CM, EF 35% - continue medical therapy; ? change BB to carvedilol (labetalol was added over the weekend for BP)EF by cath 60 %  Progress Notes signed by Duke Salvia, MD at 01/12/2013 8:46 AM   10/12/12 - Echo - EF 35%, Grade 1 Diastolic Dysfunction per [facility]   10/20/12 - Left Heart Cath - EF 50-55% by LV Gram per [facility]    Based on your clinical judgment, please clarify and document the TYPE of Heart Failure monitored and treated this admission in the discharge summary.   In responding to this query please exercise your independent judgment.     The fact that a query is asked, does not imply that any particular answer is desired or expected.   Reviewed: 01/12/13 -  "acute on chronic systolic and diastolic heart failure" documented in dc summary by Theodore Demark, PAC.  Mathis Dad RN  Thank You,  Jerral Ralph  RN BSN CCDS Certified Clinical Documentation Specialist: Cell   [phone]  Health Information Management [facility]  TO RESPOND TO THE THIS QUERY, FOLLOW THE INSTRUCTIONS BELOW:  1. If needed, update documentation for the patient's encounter via the notes activity.  2. Access this query again and click edit on the In [employer].  3. After updating, or not, click F2 to complete all highlighted (required) fields concerning your review. Select "additional documentation in the medical record" OR "no additional documentation provided".  4. Click Sign note button.  5. The deficiency will fall out of your In Basket *Please let us know if you are not able to complete this workflow by phone or e-mail (listed below).

## 2013-01-12 NOTE — Progress Notes (Signed)
Informed pt that R. Barrett, PA returned my  Call and notified her about going home and that you daughter was here to pick him up.  Instructed that she is working on it and d/c instructions will be ready at 1300.  Pt verbalized understanding and to wait for d/c instruction.  Also notified Arlys John, at central monitor that pt had d/c'd his tele MD is to d/ c home this afternoon.  Amanda Pea, Charity fundraiser.

## 2013-01-12 NOTE — Care Management Note (Signed)
    Page 1 of 1   01/12/2013     12:05:48 PM   CARE MANAGEMENT NOTE 01/12/2013  Patient:  Malik Rojas, Malik Rojas   Account Number:  [id number]  Date Initiated:  01/10/2013  Documentation initiated by:  [facility]  Subjective/Objective Assessment:   acute on chronic systolic and diastolic HF     Action/Plan:   HH for CHF disease mgmt   Anticipated DC Date:  01/11/2013   Anticipated DC Plan:  HOME W HOME HEALTH SERVICES      DC Planning Services  CM consult      Choice offered to / List presented to:  C-1 Patient        HH arranged  HH-1 RN  HH-10 DISEASE MANAGEMENT      HH agency  [facility]   Status of service:  Completed, signed off Medicare Important Message given?   (If response is "NO", the following Medicare IM given date fields will be blank) Date Medicare IM given:   Date Additional Medicare IM given:    Discharge Disposition:  HOME W HOME HEALTH SERVICES  Per UR Regulation:  Reviewed for med. necessity/level of care/duration of stay  If discussed at Long Length of Stay Meetings, dates discussed:    Comments:  01/12/13 1100 In to complete Heart Failure home health screen.  Pt. is agreeable to home health, and after looking at home health list, pt. gave 2 choices, of [facility] and [facility].  This NCM TC to [facility] [phone]) and was told they did not have a RN in [place] to provide, and TC to [place], [phone]), and they do not take [facility].  Made pt. aware of changes, and he chose [facility] TC to Debbie, with [facility], and they are able to provide [facility] RN for heart failure management.  Pt. to dc home today with dtr. Tera Mater, RN, BSN NCM [phone]

## 2013-01-19 ENCOUNTER — Encounter: Payer: Self-pay | Admitting: Internal Medicine

## 2013-01-20 ENCOUNTER — Ambulatory Visit (INDEPENDENT_AMBULATORY_CARE_PROVIDER_SITE_OTHER): Payer: Medicare Other | Admitting: *Deleted

## 2013-01-20 DIAGNOSIS — F341 Dysthymic disorder: Secondary | ICD-10-CM

## 2013-01-20 DIAGNOSIS — I442 Atrioventricular block, complete: Secondary | ICD-10-CM

## 2013-01-20 NOTE — Progress Notes (Signed)
Pacemaker check in clinic for battery check.

## 2013-01-26 ENCOUNTER — Encounter: Payer: Self-pay | Admitting: Family Medicine

## 2013-01-26 ENCOUNTER — Ambulatory Visit (INDEPENDENT_AMBULATORY_CARE_PROVIDER_SITE_OTHER): Payer: Medicare Other | Admitting: Family Medicine

## 2013-01-26 VITALS — BP 130/70 | HR 67 | Wt 220.0 lb

## 2013-01-26 DIAGNOSIS — E669 Obesity, unspecified: Secondary | ICD-10-CM

## 2013-01-26 DIAGNOSIS — E119 Type 2 diabetes mellitus without complications: Secondary | ICD-10-CM

## 2013-01-26 DIAGNOSIS — I5043 Acute on chronic combined systolic (congestive) and diastolic (congestive) heart failure: Secondary | ICD-10-CM

## 2013-01-26 DIAGNOSIS — I10 Essential (primary) hypertension: Secondary | ICD-10-CM

## 2013-01-26 NOTE — Progress Notes (Signed)
  Subjective:    Patient ID: Malik Rojas, male    DOB: [birth year], 79 y.o.   MRN: [id number]  HPI He is here for a followup evaluation. He was seen recently in the hospital and treated for congestive failure. The record was reviewed area he has lost 11 pounds. He checks his blood sugars daily and they run under 130. He checks his feet every other day. You will get an eye exam sometime soon. He continues on medications listed in the chart. He does not smoke or drink. His wife is here with him who helps take care of him. There's been no change in his diet and he does not exercise.   Review of Systems     Objective:   Physical Exam Alert and in no distress. Globin A1c is 6.0. Lower extremities do show some edema.       Assessment & Plan:  Diabetes mellitus - Plan: POCT glycosylated hemoglobin (Hb A1C)  Acute on chronic systolic and diastolic heart failure, NYHA class 4  Obesity (BMI 30-39.9)  HTN (hypertension), benign he is to continue on his present medication regimen. I will check him again in 4 months. Encouraged him to return here for flu shot.

## 2013-01-28 ENCOUNTER — Ambulatory Visit (INDEPENDENT_AMBULATORY_CARE_PROVIDER_SITE_OTHER): Payer: Medicare Other | Admitting: Physician Assistant

## 2013-01-28 ENCOUNTER — Telehealth: Payer: Self-pay | Admitting: *Deleted

## 2013-01-28 ENCOUNTER — Other Ambulatory Visit: Payer: Medicare Other

## 2013-01-28 ENCOUNTER — Encounter: Payer: Self-pay | Admitting: Physician Assistant

## 2013-01-28 VITALS — BP 128/64 | HR 61 | Ht 68.0 in | Wt 219.1 lb

## 2013-01-28 DIAGNOSIS — I10 Essential (primary) hypertension: Secondary | ICD-10-CM

## 2013-01-28 DIAGNOSIS — I5043 Acute on chronic combined systolic (congestive) and diastolic (congestive) heart failure: Secondary | ICD-10-CM

## 2013-01-28 DIAGNOSIS — I2510 Atherosclerotic heart disease of native coronary artery without angina pectoris: Secondary | ICD-10-CM

## 2013-01-28 DIAGNOSIS — Z950 Presence of cardiac pacemaker: Secondary | ICD-10-CM

## 2013-01-28 DIAGNOSIS — I5042 Chronic combined systolic (congestive) and diastolic (congestive) heart failure: Secondary | ICD-10-CM

## 2013-01-28 DIAGNOSIS — E785 Hyperlipidemia, unspecified: Secondary | ICD-10-CM

## 2013-01-28 LAB — BASIC METABOLIC PANEL
BUN: 35 mg/dL — ABNORMAL HIGH (ref 6–23)
CO2: 30 mEq/L (ref 19–32)
Chloride: 98 mEq/L (ref 96–112)
Glucose, Bld: 81 mg/dL (ref 70–99)
Potassium: 4.2 mEq/L (ref 3.5–5.1)

## 2013-01-28 MED ORDER — POTASSIUM CHLORIDE CRYS ER 20 MEQ PO TBCR: 10.0000 meq | EXTENDED_RELEASE_TABLET | Freq: Every day | ORAL | Status: DC

## 2013-01-28 MED ORDER — FUROSEMIDE 80 MG PO TABS: 40.0000 mg | ORAL_TABLET | Freq: Every day | ORAL | Status: DC

## 2013-01-28 NOTE — Telephone Encounter (Signed)
Message copied by Tarri Fuller on Thu Jan 28, 2013  5:16 PM ------      Message from: [place], [place] T      Created: Thu Jan 28, 2013  4:41 PM       BUN and Creatinine increased      Decrease Lasix to 40 mg QD and K+ to 10 mEq QD      Check repeat BMET on 6/30 at f/u OV      Weigh daily and call if:  Weight up 2-3 lbs in one day, increased swelling or increased dyspnea.       Tereso Newcomer, PA-C        01/28/2013 4:41 PM ------

## 2013-01-28 NOTE — Telephone Encounter (Signed)
pt notified about lab results and to decrease lasix to 40 mg qd, k+ 10 meq qd, bmet  on 6/30 when he comes in for appt. I had pt read back instructions to me, did so w/verbal understanding. pt advised to weigh daily and call if 2-3 lb x 1 day, sob, edema

## 2013-01-28 NOTE — Patient Instructions (Addendum)
Your physician recommends that you continue on your current medications as directed. Please refer to the Current Medication list given to you today.   Your physician recommends that you have lab work today: bmet, our office will call you back with the results  Your physician recommends that you keep your follow-up appointment Monday, June 30th

## 2013-01-28 NOTE — Progress Notes (Signed)
[address], [address] [place], [place]  [address] Phone: [phone] Fax:  [phone]  Date:  01/28/2013   ID:  Malik Rojas, DOB Dec 14, 1931, MRN [id number]  PCP:  Carollee Herter, MD  Cardiologist:  Dr. Sherryl Manges     History of Present Illness: Malik Rojas is a 79 y.o. male who returns for follow up after recent admission to the hospital for acute on chronic combined systolic and diastolic CHF.  He has a hx of HTN, DM2, HL, diastolic CHF, high grade HB, s/p pacer.  Myoview 3/14:  EF 36%, apical inf scar, inf-apical HK, no ischemia. Echo 3/14: EF 35% with worse HK in lat, dist inf-septal and apical segments and mild diastolic dysfunction.  Given the patient's reduction in LV function, cardiac catheterization was planned.  LHC 3/14: Distal left main 10%, LAD 20%, circumflex 10-20%, proximal RCA 20%, distal 40-50%, EF 50-55%.  Patient was admitted 6/6-6/10. He presented with increasing shortness of breath and weight gain. He was diuresed with IV Lasix.  Since discharge, he has done well. Weights at home have been stable. Breathing is improved.  The patient denies chest pain, shortness of breath, syncope, orthopnea, PND or significant pedal edema.   Labs (2/14): K 4.7, creatinine 1.23, ALT 10, BNP 142, Hgb 14.4, A1c 6.6.  CXR 2/14: No acute findings.  Labs (3/14): K 6.1 (hemolyzed) => 3.7, creatinine 1.2 => 1.4 Labs (6/14):  K 4.3, Cr 1.23=>1.47, ALT 10, Hgb 14.7, TSH 1.527  Wt Readings from Last 3 Encounters:  01/28/13 219 lb 1.9 oz (99.392 kg)  01/26/13 220 lb (99.791 kg)  01/12/13 213 lb 3 oz (96.7 kg)     Past Medical History  Diagnosis Date  . Complete heart block     Status post Medtronic pacemaker  . Diabetes mellitus   . HTN (hypertension)   . OSA (obstructive sleep apnea)   . Edema   . Obesity   . Chronic combined systolic and diastolic heart failure     Ejection fraction 55% with elevated end-diastolic pressure-catheterization 3/14  .  HLD (hyperlipidemia)   . CKD (chronic kidney disease)   . Hx of cardiovascular stress test     a. Aden MV 3/14:  Low risk, EF 36%, apical inf scar, no ischemia, inf-apical HK>> catheterization however demonstrated normal left ventricular function  . Cardiac pacemaker Medtronic   . CAD (coronary artery disease)     LHC 3/14: Distal left main 10%, LAD 20%, circumflex 10-20%, proximal RCA 20%, distal 40-50%, EF 50-55%.    Current Outpatient Prescriptions  Medication Sig Dispense Refill  . aspirin 81 MG tablet Take 81 mg by mouth daily.      . furosemide (LASIX) 80 MG tablet Take 1 tablet (80 mg total) by mouth daily.  30 tablet  11  . glimepiride (AMARYL) 1 MG tablet TAKE 1 TABLET BY MOUTH TWICE DAILY  60 tablet  3  . labetalol (NORMODYNE) 300 MG tablet Take 1 tablet (300 mg total) by mouth 2 (two) times daily.  60 tablet  4  . lisinopril (PRINIVIL,ZESTRIL) 10 MG tablet Take 1 tablet (10 mg total) by mouth 2 (two) times daily.  60 tablet  11  . Omega-3 Fatty Acids (FISH OIL) 1000 MG CAPS Take 1 capsule by mouth daily.        . potassium chloride SA (K-DUR,KLOR-CON) 20 MEQ tablet Take 1 tablet (20 mEq total) by mouth daily.  30 tablet  11  . pravastatin (PRAVACHOL) 80  MG tablet TAKE ONE TABLET BY MOUTH ONE TIME DAILY  30 tablet  5  . sitaGLIPtan-metformin (JANUMET) 50-1000 MG per tablet Take 1 tablet by mouth 2 (two) times daily with a meal.  60 tablet  3  . Solifenacin Succinate (VESICARE PO) Take 5 mg by mouth daily.       . tamsulosin (FLOMAX) 0.4 MG CAPS Take 0.4 mg by mouth daily.       No current facility-administered medications for this visit.    Allergies:   No Known Allergies  Social History:  The patient  reports that he quit smoking about 42 years ago. He quit smokeless tobacco use about 49 years ago. He reports that  drinks alcohol. He reports that he does not use illicit drugs.   ROS:  Please see the history of present illness.    All other systems reviewed and negative.    PHYSICAL EXAM: VS:  BP 128/64  Pulse 61  Ht 5\' 8"  (1.727 m)  Wt 219 lb 1.9 oz (99.392 kg)  BMI 33.32 kg/m2 Well nourished, well developed, in no acute distress HEENT: normal Neck: no JVD Cardiac:  normal S1, S2; RRR; no murmur Lungs:  clear to auscultation bilaterally, no wheezing, rhonchi or rales Abd: soft, nontender, no hepatomegaly Ext: no edema Skin: warm and dry Neuro:  CNs 2-12 intact, no focal abnormalities noted  EKG:  AV paced, HR 61     ASSESSMENT AND PLAN:  1. Chronic Combined Systolic and Diastolic CHF:  Volume stable. Continue current therapy. Check a basic metabolic panel today. 2. Status Post Pacemaker: Device is at ERI. He has an appointment 6/30 with Dr. Graciela Husbands. 3. Nonischemic Cardiomyopathy: Continue current dose of beta blocker and ACE inhibitor. 4. Hypertension: Controlled. 5. Hyperlipidemia: Continue statin. 6. CAD: No angina. Continue aspirin and statin. 7. Disposition: Follow up as planned 6/30 with Dr. Graciela Husbands.  Signed, Tereso Newcomer, PA-C  01/28/2013 1:14 PM

## 2013-02-01 ENCOUNTER — Ambulatory Visit (INDEPENDENT_AMBULATORY_CARE_PROVIDER_SITE_OTHER): Payer: Medicare Other | Admitting: Internal Medicine

## 2013-02-01 ENCOUNTER — Encounter: Payer: Self-pay | Admitting: Internal Medicine

## 2013-02-01 ENCOUNTER — Encounter: Payer: Self-pay | Admitting: *Deleted

## 2013-02-01 VITALS — BP 136/61 | HR 61 | Ht 67.0 in | Wt 222.0 lb

## 2013-02-01 DIAGNOSIS — I442 Atrioventricular block, complete: Secondary | ICD-10-CM

## 2013-02-01 DIAGNOSIS — I5032 Chronic diastolic (congestive) heart failure: Secondary | ICD-10-CM

## 2013-02-01 DIAGNOSIS — Z950 Presence of cardiac pacemaker: Secondary | ICD-10-CM

## 2013-02-01 LAB — BASIC METABOLIC PANEL
CO2: 28 mEq/L (ref 19–32)
Calcium: 9.6 mg/dL (ref 8.4–10.5)
Creatinine, Ser: 1.6 mg/dL — ABNORMAL HIGH (ref 0.4–1.5)
Glucose, Bld: 137 mg/dL — ABNORMAL HIGH (ref 70–99)

## 2013-02-01 LAB — PROTIME-INR: INR: 1.1 ratio — ABNORMAL HIGH (ref 0.8–1.0)

## 2013-02-01 LAB — CBC WITH DIFFERENTIAL/PLATELET
Basophils Absolute: 0 10*3/uL (ref 0.0–0.1)
Eosinophils Absolute: 0.1 10*3/uL (ref 0.0–0.7)
Lymphocytes Relative: 21.1 % (ref 12.0–46.0)
MCHC: 32.3 g/dL (ref 30.0–36.0)
MCV: 90.6 fl (ref 78.0–100.0)
Monocytes Absolute: 0.9 10*3/uL (ref 0.1–1.0)
Neutro Abs: 5.1 10*3/uL (ref 1.4–7.7)
Neutrophils Relative %: 65.4 % (ref 43.0–77.0)
RDW: 15.3 % — ABNORMAL HIGH (ref 11.5–14.6)

## 2013-02-01 NOTE — Progress Notes (Signed)
Patient Care Team: Ronnald Nian, MD as PCP - General   HPI  Malik Rojas is a 79 y.o. male Seen in followup for pacemaker implantation for high-grade heart block and presyncope.   March 2014 left heart cath showed no significant coronary disease ejection fraction by cath was 50-55% as opposed to a noninvasive imaging. He was treated for heart failure with IV Lasix and BiPAP.   He has been doing much better from brathing point of view  Device has reached ERI and have been reprogrammed      Past Medical History  Diagnosis Date  . Complete heart block     Status post Medtronic pacemaker  . Diabetes mellitus   . HTN (hypertension)   . OSA (obstructive sleep apnea)   . Edema   . Obesity   . Chronic combined systolic and diastolic heart failure     Ejection fraction 55% with elevated end-diastolic pressure-catheterization 3/14  . HLD (hyperlipidemia)   . CKD (chronic kidney disease)   . Hx of cardiovascular stress test     a. Aden MV 3/14:  Low risk, EF 36%, apical inf scar, no ischemia, inf-apical HK>> catheterization however demonstrated normal left ventricular function  . Cardiac pacemaker Medtronic   . CAD (coronary artery disease)     LHC 3/14: Distal left main 10%, LAD 20%, circumflex 10-20%, proximal RCA 20%, distal 40-50%, EF 50-55%.    Past Surgical History  Procedure Laterality Date  . Pacemaker insertion      medtronic EnRhythm  . Appendectomy      Current Outpatient Prescriptions  Medication Sig Dispense Refill  . aspirin 81 MG tablet Take 81 mg by mouth daily.      . furosemide (LASIX) 80 MG tablet Take 0.5 tablets (40 mg total) by mouth daily.      Marland Kitchen glimepiride (AMARYL) 1 MG tablet TAKE 1 TABLET BY MOUTH TWICE DAILY  60 tablet  3  . labetalol (NORMODYNE) 300 MG tablet Take 1 tablet (300 mg total) by mouth 2 (two) times daily.  60 tablet  4  . lisinopril (PRINIVIL,ZESTRIL) 10 MG tablet Take 1 tablet (10 mg total) by mouth 2 (two) times daily.  60  tablet  11  . Omega-3 Fatty Acids (FISH OIL) 1000 MG CAPS Take 1 capsule by mouth daily.        . potassium chloride SA (K-DUR,KLOR-CON) 20 MEQ tablet Take 0.5 tablets (10 mEq total) by mouth daily.      . pravastatin (PRAVACHOL) 80 MG tablet TAKE ONE TABLET BY MOUTH ONE TIME DAILY  30 tablet  5  . sitaGLIPtan-metformin (JANUMET) 50-1000 MG per tablet Take 1 tablet by mouth 2 (two) times daily with a meal.  60 tablet  3  . Solifenacin Succinate (VESICARE PO) Take 5 mg by mouth daily.       . tamsulosin (FLOMAX) 0.4 MG CAPS Take 0.4 mg by mouth daily.       No current facility-administered medications for this visit.    No Known Allergies  Review of Systems negative except from HPI and PMH  Physical Exam BP 136/61  Pulse 61  Ht 5\' 7"  (1.702 m)  Wt 222 lb (100.699 kg)  BMI 34.76 kg/m2 Well developed and nourished plethoric facies in no acute distress HENT normal Neck supple with JVP-flat Clear Regular rate and rhythm, no murmurs or gallops Abd-soft with active BS No Clubbing cyanosis edema Skin-warm and dry A & Oriented  Grossly normal sensory and motor function  Device pocket well healed; without hematoma or erythema \     Assessment and  Plan

## 2013-02-01 NOTE — Assessment & Plan Note (Signed)
Stable and euvolemic 

## 2013-02-01 NOTE — Assessment & Plan Note (Signed)
Has reached eri  We have reviewed the benefits and risks of generator replacement.  These include but are not limited to lead fracture and infection.  The patient understands, agrees and is willing to proceed.

## 2013-02-01 NOTE — Patient Instructions (Signed)
Your physician has recommended that you have a pacemaker generator change. Please see the instruction sheet given to you today for more information.    

## 2013-02-01 NOTE — Assessment & Plan Note (Signed)
Stable post pacing 

## 2013-02-02 ENCOUNTER — Encounter: Payer: Self-pay | Admitting: *Deleted

## 2013-02-04 ENCOUNTER — Encounter: Payer: Self-pay | Admitting: Internal Medicine

## 2013-02-06 ENCOUNTER — Other Ambulatory Visit: Payer: Self-pay | Admitting: Family Medicine

## 2013-02-08 ENCOUNTER — Encounter (HOSPITAL_COMMUNITY): Payer: Self-pay | Admitting: Respiratory Therapy

## 2013-02-11 MED ORDER — CEFAZOLIN SODIUM-DEXTROSE 2-3 GM-% IV SOLR
2.0000 g | INTRAVENOUS | Status: DC
  Filled 2013-02-11 (×2): qty 50

## 2013-02-11 MED ORDER — CEFAZOLIN SODIUM-DEXTROSE 2-3 GM-% IV SOLR
2.0000 g | INTRAVENOUS | Status: DC
  Filled 2013-02-11: qty 50

## 2013-02-11 MED ORDER — SODIUM CHLORIDE 0.9 % IR SOLN
80.0000 mg | Status: DC
  Filled 2013-02-11: qty 2

## 2013-02-12 ENCOUNTER — Encounter (HOSPITAL_COMMUNITY)
Admission: RE | Disposition: A | Discharge status: Home / Self Care | Payer: Self-pay | Source: Ambulatory Visit | Attending: Internal Medicine

## 2013-02-12 ENCOUNTER — Ambulatory Visit (HOSPITAL_COMMUNITY)
Admission: RE | Admit: 2013-02-12 | Discharge: 2013-02-12 | Disposition: A | Discharge status: Home / Self Care | Payer: Medicare Other | Source: Ambulatory Visit | Attending: Internal Medicine | Admitting: Internal Medicine

## 2013-02-12 DIAGNOSIS — G4733 Obstructive sleep apnea (adult) (pediatric): Secondary | ICD-10-CM | POA: Insufficient documentation

## 2013-02-12 DIAGNOSIS — Z950 Presence of cardiac pacemaker: Secondary | ICD-10-CM

## 2013-02-12 DIAGNOSIS — I5042 Chronic combined systolic (congestive) and diastolic (congestive) heart failure: Secondary | ICD-10-CM | POA: Insufficient documentation

## 2013-02-12 DIAGNOSIS — N189 Chronic kidney disease, unspecified: Secondary | ICD-10-CM | POA: Insufficient documentation

## 2013-02-12 DIAGNOSIS — Z79899 Other long term (current) drug therapy: Secondary | ICD-10-CM | POA: Insufficient documentation

## 2013-02-12 DIAGNOSIS — E669 Obesity, unspecified: Secondary | ICD-10-CM | POA: Insufficient documentation

## 2013-02-12 DIAGNOSIS — I442 Atrioventricular block, complete: Secondary | ICD-10-CM

## 2013-02-12 DIAGNOSIS — E785 Hyperlipidemia, unspecified: Secondary | ICD-10-CM | POA: Insufficient documentation

## 2013-02-12 DIAGNOSIS — I509 Heart failure, unspecified: Secondary | ICD-10-CM | POA: Insufficient documentation

## 2013-02-12 DIAGNOSIS — E119 Type 2 diabetes mellitus without complications: Secondary | ICD-10-CM | POA: Insufficient documentation

## 2013-02-12 DIAGNOSIS — Z7982 Long term (current) use of aspirin: Secondary | ICD-10-CM | POA: Insufficient documentation

## 2013-02-12 DIAGNOSIS — Z45018 Encounter for adjustment and management of other part of cardiac pacemaker: Secondary | ICD-10-CM | POA: Insufficient documentation

## 2013-02-12 DIAGNOSIS — I5032 Chronic diastolic (congestive) heart failure: Secondary | ICD-10-CM

## 2013-02-12 DIAGNOSIS — I129 Hypertensive chronic kidney disease with stage 1 through stage 4 chronic kidney disease, or unspecified chronic kidney disease: Secondary | ICD-10-CM | POA: Insufficient documentation

## 2013-02-12 HISTORY — PX: PERMANENT PACEMAKER GENERATOR CHANGE: SHX6022

## 2013-02-12 LAB — GLUCOSE, CAPILLARY
Glucose-Capillary: 106 mg/dL — ABNORMAL HIGH (ref 70–99)
Glucose-Capillary: 87 mg/dL (ref 70–99)

## 2013-02-12 SURGERY — PERMANENT PACEMAKER GENERATOR CHANGE
Anesthesia: LOCAL

## 2013-02-12 MED ORDER — MUPIROCIN 2 % EX OINT
TOPICAL_OINTMENT | Freq: Two times a day (BID) | CUTANEOUS | Status: DC
  Administered 2013-02-12: 06:00:00 via NASAL
  Filled 2013-02-12: qty 22

## 2013-02-12 MED ORDER — CHLORHEXIDINE GLUCONATE 4 % EX LIQD: 60.0000 mL | Freq: Once | CUTANEOUS | Status: DC

## 2013-02-12 MED ORDER — MIDAZOLAM HCL 5 MG/5ML IJ SOLN
INTRAMUSCULAR | Status: AC
  Filled 2013-02-12: qty 5

## 2013-02-12 MED ORDER — LIDOCAINE HCL (PF) 1 % IJ SOLN
INTRAMUSCULAR | Status: AC
  Filled 2013-02-12: qty 60

## 2013-02-12 MED ORDER — SODIUM CHLORIDE 0.9 % IV SOLN
INTRAVENOUS | Status: DC
  Administered 2013-02-12: 1000 mL via INTRAVENOUS

## 2013-02-12 MED ORDER — SODIUM CHLORIDE 0.9 % IV SOLN: INTRAVENOUS | Status: DC

## 2013-02-12 NOTE — H&P (View-Only) (Signed)
Patient Care Team: John C Lalonde, MD as PCP - General   HPI  Malik Rojas is a 79 y.o. male Seen in followup for pacemaker implantation for high-grade heart block and presyncope.   March 2014 left heart cath showed no significant coronary disease ejection fraction by cath was 50-55% as opposed to a noninvasive imaging. He was treated for heart failure with IV Lasix and BiPAP.   He has been doing much better from brathing point of view  Device has reached ERI and have been reprogrammed      Past Medical History  Diagnosis Date  . Complete heart block     Status post Medtronic pacemaker  . Diabetes mellitus   . HTN (hypertension)   . OSA (obstructive sleep apnea)   . Edema   . Obesity   . Chronic combined systolic and diastolic heart failure     Ejection fraction 55% with elevated end-diastolic pressure-catheterization 3/14  . HLD (hyperlipidemia)   . CKD (chronic kidney disease)   . Hx of cardiovascular stress test     a. Aden MV 3/14:  Low risk, EF 36%, apical inf scar, no ischemia, inf-apical HK>> catheterization however demonstrated normal left ventricular function  . Cardiac pacemaker Medtronic   . CAD (coronary artery disease)     LHC 3/14: Distal left main 10%, LAD 20%, circumflex 10-20%, proximal RCA 20%, distal 40-50%, EF 50-55%.    Past Surgical History  Procedure Laterality Date  . Pacemaker insertion      medtronic EnRhythm  . Appendectomy      Current Outpatient Prescriptions  Medication Sig Dispense Refill  . aspirin 81 MG tablet Take 81 mg by mouth daily.      . furosemide (LASIX) 80 MG tablet Take 0.5 tablets (40 mg total) by mouth daily.      . glimepiride (AMARYL) 1 MG tablet TAKE 1 TABLET BY MOUTH TWICE DAILY  60 tablet  3  . labetalol (NORMODYNE) 300 MG tablet Take 1 tablet (300 mg total) by mouth 2 (two) times daily.  60 tablet  4  . lisinopril (PRINIVIL,ZESTRIL) 10 MG tablet Take 1 tablet (10 mg total) by mouth 2 (two) times daily.  60  tablet  11  . Omega-3 Fatty Acids (FISH OIL) 1000 MG CAPS Take 1 capsule by mouth daily.        . potassium chloride SA (K-DUR,KLOR-CON) 20 MEQ tablet Take 0.5 tablets (10 mEq total) by mouth daily.      . pravastatin (PRAVACHOL) 80 MG tablet TAKE ONE TABLET BY MOUTH ONE TIME DAILY  30 tablet  5  . sitaGLIPtan-metformin (JANUMET) 50-1000 MG per tablet Take 1 tablet by mouth 2 (two) times daily with a meal.  60 tablet  3  . Solifenacin Succinate (VESICARE PO) Take 5 mg by mouth daily.       . tamsulosin (FLOMAX) 0.4 MG CAPS Take 0.4 mg by mouth daily.       No current facility-administered medications for this visit.    No Known Allergies  Review of Systems negative except from HPI and PMH  Physical Exam BP 136/61  Pulse 61  Ht 5' 7" (1.702 m)  Wt 222 lb (100.699 kg)  BMI 34.76 kg/m2 Well developed and nourished plethoric facies in no acute distress HENT normal Neck supple with JVP-flat Clear Regular rate and rhythm, no murmurs or gallops Abd-soft with active BS No Clubbing cyanosis edema Skin-warm and dry A & Oriented  Grossly normal sensory and motor function   Device pocket well healed; without hematoma or erythema \     Assessment and  Plan  

## 2013-02-12 NOTE — Interval H&P Note (Signed)
History and Physical Interval Note:  02/12/2013 6:51 AM  Malik Rojas  has presented today for surgery, with the diagnosis of hb/eri  The various methods of treatment have been discussed with the patient and family. After consideration of risks, benefits and other options for treatment, the patient has consented to  Procedure(s): PERMANENT PACEMAKER GENERATOR CHANGE (N/A) as a surgical intervention .  The patient's history has been reviewed, patient examined, no change in status, stable for surgery.  I have reviewed the patient's chart and labs.  Questions were answered to the patient's satisfaction.     Sherryl Manges

## 2013-02-12 NOTE — CV Procedure (Signed)
Preoperative diagnosis CHB, device at [facility] Postoperative diagnosis same/   Procedure: Generator replacement    Following informed consent the patient was brought to the electrophysiology laboratory in place of the fluoroscopic table in the supine position after routine prep and drape lidocaine was infiltrated in the region of the previous incision and carried down to later the device pocket using sharp dissection and electrocautery. The pocket was opened the device was freed up and was explanted.  Interrogation of the previously implanted ventricular lead Medtronic [id number]  demonstrated an R wave of N/A   millivolts., and impedance of 692 ohms, and a pacing threshold of 0.5 volts at 0.5 msec.    The previously implanted atrial lead [employer] Jude 1642 demonstrated a P-wave amplitude of 4 milllivolts  and impedance of  554 ohms, and a pacing threshold of 2 volts at  @ 0.86milliseconds.  The leads were inspected. The leads were then attached to a Medtronic  pulse generator, serial number [id number].    The pocket was irrigated with antibiotic containing saline solution hemostasis was assured and the leads and the device were placed in the pocket. The wound was then closed in 2  layers in normal fashion.  The patient tolerated the procedure without apparent complication.  Sherryl Manges

## 2013-02-21 ENCOUNTER — Other Ambulatory Visit: Payer: Self-pay | Admitting: Family Medicine

## 2013-02-22 ENCOUNTER — Ambulatory Visit (INDEPENDENT_AMBULATORY_CARE_PROVIDER_SITE_OTHER): Payer: Medicare Other | Admitting: *Deleted

## 2013-02-22 DIAGNOSIS — Z950 Presence of cardiac pacemaker: Secondary | ICD-10-CM

## 2013-02-22 DIAGNOSIS — I442 Atrioventricular block, complete: Secondary | ICD-10-CM

## 2013-02-23 ENCOUNTER — Telehealth: Payer: Self-pay | Admitting: Internal Medicine

## 2013-02-23 NOTE — Telephone Encounter (Signed)
Scheduled patient for follow up remote check 05/31/13, patient aware.

## 2013-02-23 NOTE — Telephone Encounter (Signed)
New Prob   Pt has some questions regarding upcoming pacer check. Please call.

## 2013-02-23 NOTE — Progress Notes (Signed)
Pt seen in device clinic for follow up of recently replaced pacemaker.  Wound well healed.  No redness, swelling, or edema.  Steri-strips removed today.   Device interrogated and found to be functioning normally.  Changed Atrial output from 4.0V to 2.5V, changed V output from 2.0V to 2.5V. See PaceArt for full details.  Pt denies chest pain, shortness of breath, palpitations, or dizziness.  Pt to follow up with device clinic in 3 months.   Sekou Zuckerman 02/23/2013 10:11 AM

## 2013-02-25 LAB — PACEMAKER DEVICE OBSERVATION
ATRIAL PACING PM: 61.5
BAMS-0001: 150 {beats}/min
RV LEAD THRESHOLD: 0.5 V
VENTRICULAR PACING PM: 100

## 2013-03-12 ENCOUNTER — Telehealth: Payer: Self-pay | Admitting: Family Medicine

## 2013-03-24 NOTE — Telephone Encounter (Signed)
LM

## 2013-03-25 ENCOUNTER — Telehealth: Payer: Self-pay | Admitting: Family Medicine

## 2013-03-26 NOTE — Telephone Encounter (Signed)
FORM COMPLETED & FAXED TO [facility]

## 2013-03-27 ENCOUNTER — Other Ambulatory Visit: Payer: Self-pay | Admitting: Family Medicine

## 2013-04-19 ENCOUNTER — Encounter: Payer: Self-pay | Admitting: Internal Medicine

## 2013-04-26 ENCOUNTER — Telehealth: Payer: Self-pay | Admitting: *Deleted

## 2013-04-26 NOTE — Telephone Encounter (Signed)
Per pt never received new transmitter in July. New transmitter ordered for pt. Pt aware will receive in next 2-3 weeks.

## 2013-05-24 ENCOUNTER — Other Ambulatory Visit: Payer: Self-pay | Admitting: Family Medicine

## 2013-05-25 ENCOUNTER — Encounter: Payer: Self-pay | Admitting: Family Medicine

## 2013-05-25 ENCOUNTER — Ambulatory Visit (INDEPENDENT_AMBULATORY_CARE_PROVIDER_SITE_OTHER): Payer: Medicare Other | Admitting: Family Medicine

## 2013-05-25 VITALS — BP 130/80 | HR 84 | Wt 224.0 lb

## 2013-05-25 DIAGNOSIS — Z950 Presence of cardiac pacemaker: Secondary | ICD-10-CM

## 2013-05-25 DIAGNOSIS — E1169 Type 2 diabetes mellitus with other specified complication: Secondary | ICD-10-CM | POA: Insufficient documentation

## 2013-05-25 DIAGNOSIS — I10 Essential (primary) hypertension: Secondary | ICD-10-CM

## 2013-05-25 DIAGNOSIS — E669 Obesity, unspecified: Secondary | ICD-10-CM

## 2013-05-25 DIAGNOSIS — I442 Atrioventricular block, complete: Secondary | ICD-10-CM

## 2013-05-25 DIAGNOSIS — E785 Hyperlipidemia, unspecified: Secondary | ICD-10-CM

## 2013-05-25 DIAGNOSIS — N183 Chronic kidney disease, stage 3 unspecified: Secondary | ICD-10-CM

## 2013-05-25 DIAGNOSIS — E119 Type 2 diabetes mellitus without complications: Secondary | ICD-10-CM

## 2013-05-25 LAB — POCT GLYCOSYLATED HEMOGLOBIN (HGB A1C): Hemoglobin A1C: 5.9

## 2013-05-25 NOTE — Progress Notes (Signed)
Subjective:    Malik Rojas is a 79 y.o. male who presents for follow-up of Type 2 diabetes mellitus.  Pacemaker changed out. It was 10 years since his last one. He does complain of decreased physical functioning. Home blood sugar records: 115  Current symptoms/problems No Daily foot checks: Any foot concerns: YES/RIGHT LEG is slightly discolored and it does show evidence of recent trauma. Last eye exam:  10/9//2014    Medication compliance: Good. Current diet: DIABETIC Current exercise: WALKING Known diabetic complications: nephropathy and cardiovascular disease Cardiovascular risk factors: advanced age (older than 88 for men, 20 for women), diabetes mellitus, dyslipidemia, hypertension, male gender, obesity (BMI >= 30 kg/m2) and sedentary lifestyle   The following portions of the patient's history were reviewed and updated as appropriate: allergies, current medications, past medical history, past social history, past surgical history and problem list.  ROS as in subjective above He did already get a flu shot.   Objective:    BP 130/80  Pulse 84  Wt 224 lb (101.606 kg)  BMI 34.07 kg/m2  SpO2 98%  Filed Vitals:   05/25/13 1100  BP: 130/80  Pulse: 84    General appearence: alert, no distress, WD/WN Neck: supple, no lymphadenopathy, no thyromegaly, no masses Heart: RRR, normal S1, S2, no murmurs Lungs: CTA bilaterally, no wheezes, rhonchi, or rales Abdomen: +bs, soft, non tender, non distended, no masses, no hepatomegaly, no splenomegaly Pulses: 2+ symmetric, upper and lower extremities, normal cap refill Ext: no edema Foot exam:  Neuro: foot monofilament exam normal   Lab Review Lab Results  Component Value Date   HGBA1C 6.0 01/26/2013   Lab Results  Component Value Date   CHOL 137 05/28/2012   HDL 35* 05/28/2012   LDLCALC 73 05/28/2012   TRIG 147 05/28/2012   CHOLHDL 3.9 05/28/2012   No results found for this basenameConcepcion Elk    Chemistry      Component Value Date/Time   NA 139 02/01/2013 1025   K 4.4 02/01/2013 1025   CL 101 02/01/2013 1025   CO2 28 02/01/2013 1025   BUN 30* 02/01/2013 1025   CREATININE 1.6* 02/01/2013 1025   CREATININE 1.23 09/28/2012 1108      Component Value Date/Time   CALCIUM 9.6 02/01/2013 1025   ALKPHOS 82 01/08/2013 2009   AST 17 01/08/2013 2009   ALT 10 01/08/2013 2009   BILITOT 0.9 01/08/2013 2009        Chemistry      Component Value Date/Time   NA 139 02/01/2013 1025   K 4.4 02/01/2013 1025   CL 101 02/01/2013 1025   CO2 28 02/01/2013 1025   BUN 30* 02/01/2013 1025   CREATININE 1.6* 02/01/2013 1025   CREATININE 1.23 09/28/2012 1108      Component Value Date/Time   CALCIUM 9.6 02/01/2013 1025   ALKPHOS 82 01/08/2013 2009   AST 17 01/08/2013 2009   ALT 10 01/08/2013 2009   BILITOT 0.9 01/08/2013 2009     Hemoglobin A1c 6.0      Assessment:   Encounter Diagnoses  Name Primary?  . Diabetes mellitus Yes  . Pacemaker-Medtronic   . Complete heart block   . HTN (hypertension), benign   . Hyperlipidemia LDL goal < 70   . Obesity (BMI 30-39.9)   . Chronic kidney disease, stage III (moderate)          Plan:    1.  Rx changes: none 2.  Education: Reviewed 'ABCs' of diabetes management (  respective goals in parentheses):  A1C (<7), blood pressure (<130/80), and cholesterol (LDL <100). 3.  Compliance at present is estimated to be good. Efforts to improve compliance (if necessary) will be directed at increased exercise. 4. Follow up: 4 months

## 2013-05-26 LAB — LIPID PANEL
Cholesterol: 120 mg/dL (ref 0–200)
HDL: 37 mg/dL — ABNORMAL LOW (ref >39)
LDL Cholesterol: 41 mg/dL (ref 0–99)
Triglycerides: 209 mg/dL — ABNORMAL HIGH (ref <150)

## 2013-05-28 ENCOUNTER — Other Ambulatory Visit: Payer: Self-pay | Admitting: Family Medicine

## 2013-05-31 ENCOUNTER — Ambulatory Visit (INDEPENDENT_AMBULATORY_CARE_PROVIDER_SITE_OTHER): Payer: Medicare Other | Admitting: *Deleted

## 2013-05-31 DIAGNOSIS — I442 Atrioventricular block, complete: Secondary | ICD-10-CM

## 2013-05-31 DIAGNOSIS — Z950 Presence of cardiac pacemaker: Secondary | ICD-10-CM

## 2013-06-04 LAB — REMOTE PACEMAKER DEVICE
AL AMPLITUDE: 2.8 mv
BATTERY VOLTAGE: 2.79 V
BRDY-0002RV: 60 {beats}/min
BRDY-0003RV: 120 {beats}/min
BRDY-0004RV: 120 {beats}/min
VENTRICULAR PACING PM: 100

## 2013-06-11 ENCOUNTER — Encounter: Payer: Self-pay | Admitting: *Deleted

## 2013-06-21 ENCOUNTER — Encounter: Payer: Self-pay | Admitting: Internal Medicine

## 2013-06-28 ENCOUNTER — Other Ambulatory Visit: Payer: Self-pay | Admitting: Family Medicine

## 2013-08-17 ENCOUNTER — Encounter: Payer: Self-pay | Admitting: Family Medicine

## 2013-08-17 ENCOUNTER — Ambulatory Visit (INDEPENDENT_AMBULATORY_CARE_PROVIDER_SITE_OTHER): Payer: Medicare Other | Admitting: Family Medicine

## 2013-08-17 VITALS — BP 144/86 | HR 84 | Wt 228.0 lb

## 2013-08-17 DIAGNOSIS — K117 Disturbances of salivary secretion: Secondary | ICD-10-CM

## 2013-08-17 DIAGNOSIS — E1169 Type 2 diabetes mellitus with other specified complication: Secondary | ICD-10-CM

## 2013-08-17 DIAGNOSIS — R682 Dry mouth, unspecified: Secondary | ICD-10-CM

## 2013-08-17 DIAGNOSIS — N318 Other neuromuscular dysfunction of bladder: Secondary | ICD-10-CM

## 2013-08-17 DIAGNOSIS — E1159 Type 2 diabetes mellitus with other circulatory complications: Secondary | ICD-10-CM

## 2013-08-17 DIAGNOSIS — N3281 Overactive bladder: Secondary | ICD-10-CM

## 2013-08-17 DIAGNOSIS — E1129 Type 2 diabetes mellitus with other diabetic kidney complication: Secondary | ICD-10-CM

## 2013-08-17 DIAGNOSIS — I152 Hypertension secondary to endocrine disorders: Secondary | ICD-10-CM

## 2013-08-17 DIAGNOSIS — I10 Essential (primary) hypertension: Secondary | ICD-10-CM

## 2013-08-17 MED ORDER — BIOTENE MOISTURIZING MOUTH MT SOLN: 2.0000 | Freq: Two times a day (BID) | OROMUCOSAL | Status: DC

## 2013-08-17 MED ORDER — LABETALOL HCL 300 MG PO TABS: 300.0000 mg | ORAL_TABLET | Freq: Two times a day (BID) | ORAL | Status: DC

## 2013-08-17 NOTE — Progress Notes (Signed)
   Subjective:    Patient ID: Malik Rojas, male    DOB: 03-04-1932, 80 y.o.   MRN: [id number]  HPI He is here for consult concerning medication management. He is being treated with Toviaz and it is causing difficulty with dry mouth. He also has questions concerning his [place]. He did bring the bottle in and it does show that he needs a refill. He also needs refills on his labetalol.    Review of Systems     Objective:   Physical Exam  alert and in no distress otherwise not examined       Assessment & Plan:  OAB (overactive bladder)  Xerostomia - Plan: Artificial Saliva (BIOTENE MOISTURIZING MOUTH) SOLN  Diabetes with renal manifestations(250.4)  Hypertension associated with diabetes - Plan: labetalol (NORMODYNE) 300 MG tablet  recheck here in 2 months.

## 2013-08-30 ENCOUNTER — Other Ambulatory Visit: Payer: Self-pay | Admitting: Family Medicine

## 2013-09-03 ENCOUNTER — Ambulatory Visit (INDEPENDENT_AMBULATORY_CARE_PROVIDER_SITE_OTHER): Payer: Medicare Other | Admitting: *Deleted

## 2013-09-03 DIAGNOSIS — I442 Atrioventricular block, complete: Secondary | ICD-10-CM

## 2013-09-03 DIAGNOSIS — I5032 Chronic diastolic (congestive) heart failure: Secondary | ICD-10-CM

## 2013-09-03 LAB — MDC_IDC_ENUM_SESS_TYPE_REMOTE
Battery Remaining Longevity: 124 mo
Battery Voltage: 2.8 V
Date Time Interrogation Session: 20150130124652
Lead Channel Impedance Value: 769 Ohm
Lead Channel Pacing Threshold Amplitude: 60.25 V
Lead Channel Setting Pacing Pulse Width: 0.4 ms
Lead Channel Setting Sensing Sensitivity: 2.8 mV
MDC IDC MSMT BATTERY IMPEDANCE: 110 Ohm
MDC IDC MSMT LEADCHNL RA IMPEDANCE VALUE: 560 Ohm
MDC IDC MSMT LEADCHNL RA SENSING INTR AMPL: 2.8 mV
MDC IDC MSMT LEADCHNL RV PACING THRESHOLD PULSEWIDTH: 0.4 ms
MDC IDC SET LEADCHNL RA PACING AMPLITUDE: 2.5 V
MDC IDC SET LEADCHNL RV PACING AMPLITUDE: 2 V
MDC IDC STAT BRADY AP VP PERCENT: 64 %
MDC IDC STAT BRADY AP VS PERCENT: 0 %
MDC IDC STAT BRADY AS VP PERCENT: 36 %
MDC IDC STAT BRADY AS VS PERCENT: 0 %

## 2013-09-14 ENCOUNTER — Encounter: Payer: Self-pay | Admitting: *Deleted

## 2013-09-22 ENCOUNTER — Encounter: Payer: Self-pay | Admitting: Internal Medicine

## 2013-09-27 ENCOUNTER — Other Ambulatory Visit: Payer: Self-pay | Admitting: Family Medicine

## 2013-10-18 ENCOUNTER — Ambulatory Visit (INDEPENDENT_AMBULATORY_CARE_PROVIDER_SITE_OTHER): Payer: Medicare Other | Admitting: Family Medicine

## 2013-10-18 ENCOUNTER — Encounter: Payer: Self-pay | Admitting: Family Medicine

## 2013-10-18 VITALS — BP 160/82 | HR 80 | Wt 226.0 lb

## 2013-10-18 DIAGNOSIS — I10 Essential (primary) hypertension: Secondary | ICD-10-CM

## 2013-10-18 DIAGNOSIS — N3941 Urge incontinence: Secondary | ICD-10-CM

## 2013-10-18 DIAGNOSIS — E1159 Type 2 diabetes mellitus with other circulatory complications: Secondary | ICD-10-CM

## 2013-10-18 DIAGNOSIS — E1169 Type 2 diabetes mellitus with other specified complication: Secondary | ICD-10-CM

## 2013-10-18 MED ORDER — LABETALOL HCL 200 MG PO TABS: 200.0000 mg | ORAL_TABLET | Freq: Two times a day (BID) | ORAL | Status: DC

## 2013-10-18 NOTE — Patient Instructions (Signed)
Your new blood pressure medicine will be 2 pills twice a day so get rid of the 300 mg pills

## 2013-10-18 NOTE — Progress Notes (Signed)
   Subjective:    Patient ID: Malik Rojas, male    DOB: 1932/04/04, 80 y.o.   MRN: [id number]  HPI He is here for recheck on his blood pressure. He was recently placed back on labetalol. He has had no difficulties with this. He continues to have difficulty with urinary incontinence and has been tried on various medications. None of them have helped as much as he would like. He would like to pursue this further and possibly have surgical intervention.   Review of Systems     Objective:   Physical Exam Alert and in no distress. Blood pressure is recorded.       Assessment & Plan:  Hypertension associated with diabetes - Plan: labetalol (NORMODYNE) 200 MG tablet  Urge incontinence  I will increase his labetalol to 400 mg twice a day. Also encouraged them to talk to the urologist concerning possible surgical intervention to help with his incontinence versus other medications. Recheck here one month.

## 2013-10-28 ENCOUNTER — Other Ambulatory Visit: Payer: Self-pay | Admitting: Family Medicine

## 2013-11-18 ENCOUNTER — Ambulatory Visit (INDEPENDENT_AMBULATORY_CARE_PROVIDER_SITE_OTHER): Payer: Medicare Other | Admitting: Family Medicine

## 2013-11-18 ENCOUNTER — Encounter: Payer: Self-pay | Admitting: Family Medicine

## 2013-11-18 VITALS — BP 130/72 | HR 88 | Wt 229.0 lb

## 2013-11-18 DIAGNOSIS — I10 Essential (primary) hypertension: Secondary | ICD-10-CM

## 2013-11-18 DIAGNOSIS — E1159 Type 2 diabetes mellitus with other circulatory complications: Secondary | ICD-10-CM

## 2013-11-18 DIAGNOSIS — N3941 Urge incontinence: Secondary | ICD-10-CM

## 2013-11-18 DIAGNOSIS — E1169 Type 2 diabetes mellitus with other specified complication: Secondary | ICD-10-CM

## 2013-11-18 NOTE — Patient Instructions (Signed)
Stay on your present dosing of your blood pressure medicine

## 2013-11-18 NOTE — Progress Notes (Signed)
   Subjective:    Patient ID: Malik Rojas, male    DOB: 01-16-1932, 80 y.o.   MRN: [id number]  HPI He is here for recheck. On his last visit his blood pressure medication was doubled. He has had no difficulty with that. He has yet to followup with urology concerning his incontinence.   Review of Systems     Objective:   Physical Exam Alert and in no distress otherwise not examined. Blood pressure is recorded.       Assessment & Plan:  Hypertension associated with diabetes  Urge incontinence  encouraged him to followup with urology and continue his present medication regimen.

## 2013-11-24 ENCOUNTER — Encounter: Payer: Self-pay | Admitting: Internal Medicine

## 2013-11-29 ENCOUNTER — Other Ambulatory Visit: Payer: Self-pay | Admitting: Family Medicine

## 2013-12-06 ENCOUNTER — Ambulatory Visit (INDEPENDENT_AMBULATORY_CARE_PROVIDER_SITE_OTHER): Payer: Medicare Other | Admitting: *Deleted

## 2013-12-06 ENCOUNTER — Encounter: Payer: Self-pay | Admitting: Internal Medicine

## 2013-12-06 DIAGNOSIS — I442 Atrioventricular block, complete: Secondary | ICD-10-CM

## 2013-12-06 DIAGNOSIS — R55 Syncope and collapse: Secondary | ICD-10-CM

## 2013-12-08 LAB — MDC_IDC_ENUM_SESS_TYPE_REMOTE
Brady Statistic AS VS Percent: 0 %
Lead Channel Impedance Value: 707 Ohm
Lead Channel Pacing Threshold Amplitude: 0.5 V
Lead Channel Pacing Threshold Pulse Width: 0.4 ms
Lead Channel Sensing Intrinsic Amplitude: 2.8 mV
MDC IDC MSMT BATTERY IMPEDANCE: 110 Ohm
MDC IDC MSMT BATTERY REMAINING LONGEVITY: 125 mo
MDC IDC MSMT BATTERY VOLTAGE: 2.8 V
MDC IDC MSMT LEADCHNL RA IMPEDANCE VALUE: 553 Ohm
MDC IDC SESS DTM: 20150504104832
MDC IDC SET LEADCHNL RA PACING AMPLITUDE: 2.5 V
MDC IDC SET LEADCHNL RV PACING AMPLITUDE: 2 V
MDC IDC SET LEADCHNL RV PACING PULSEWIDTH: 0.4 ms
MDC IDC SET LEADCHNL RV SENSING SENSITIVITY: 2.8 mV
MDC IDC STAT BRADY AP VP PERCENT: 56 %
MDC IDC STAT BRADY AP VS PERCENT: 0 %
MDC IDC STAT BRADY AS VP PERCENT: 44 %

## 2013-12-31 ENCOUNTER — Encounter: Payer: Self-pay | Admitting: Cardiology

## 2014-01-18 ENCOUNTER — Telehealth: Payer: Self-pay | Admitting: Internal Medicine

## 2014-01-18 NOTE — Telephone Encounter (Signed)
[facility] at [place] is wanting to know since patient has heart failure can they change his labetalol to carvedilol 6.25mg  BID #60. They can ask him to schedule a follow-up.

## 2014-01-18 NOTE — Telephone Encounter (Signed)
Pt is coming in tomorrow @ 11:30am for an appt

## 2014-01-19 ENCOUNTER — Encounter: Payer: Self-pay | Admitting: Family Medicine

## 2014-01-19 ENCOUNTER — Ambulatory Visit (INDEPENDENT_AMBULATORY_CARE_PROVIDER_SITE_OTHER): Payer: Medicare Other | Admitting: Family Medicine

## 2014-01-19 VITALS — BP 150/84 | Wt 227.0 lb

## 2014-01-19 DIAGNOSIS — Z950 Presence of cardiac pacemaker: Secondary | ICD-10-CM

## 2014-01-19 DIAGNOSIS — I5032 Chronic diastolic (congestive) heart failure: Secondary | ICD-10-CM

## 2014-01-19 DIAGNOSIS — N3941 Urge incontinence: Secondary | ICD-10-CM

## 2014-01-19 NOTE — Progress Notes (Signed)
   Subjective:    Patient ID: Malik Rojas, male    DOB: [birth year], 81 y.o.   MRN: [id number]  HPI He is here for consultation concerning possibly switching to a different cardiac medications. I received a note from [facility] suggesting switching to Coreg from a labetalol. He states that he recently did have his pacemaker checked. He does get this checked periodically and apparently is having difficulty getting insurance to cover the cost of doing this over the phone. He has not seen his cardiologist in the last several months but apparently does have an appointment in the near future. He continues to be followed by urology and is now seeing Dr. Gaynelle Arabian. He states that his leg is doing much better.   Review of Systems     Objective:   Physical Exam Alert and in no distress. Cardiac exam does show a regular rhythm. Exam of the left lower leg does show a less erythema warmth and no tenderness.       Assessment & Plan:  Urge incontinence  Pacemaker  Chronic diastolic heart failure  I explained that I did not feel comfortable making changes to his cardiac medicines based on the pharmacist report from [facility]. Recommend he followup with his cardiologist to verify correct medication dosing.

## 2014-01-24 ENCOUNTER — Other Ambulatory Visit: Payer: Self-pay | Admitting: Physician Assistant

## 2014-01-25 ENCOUNTER — Other Ambulatory Visit: Payer: Self-pay | Admitting: Physician Assistant

## 2014-02-03 ENCOUNTER — Other Ambulatory Visit: Payer: Self-pay | Admitting: Family Medicine

## 2014-03-08 ENCOUNTER — Encounter: Payer: Self-pay | Admitting: Internal Medicine

## 2014-03-09 ENCOUNTER — Encounter: Payer: Self-pay | Admitting: Internal Medicine

## 2014-03-09 ENCOUNTER — Ambulatory Visit (INDEPENDENT_AMBULATORY_CARE_PROVIDER_SITE_OTHER): Payer: Medicare Other | Admitting: Internal Medicine

## 2014-03-09 VITALS — BP 189/86 | HR 81 | Ht 70.0 in | Wt 226.0 lb

## 2014-03-09 DIAGNOSIS — I152 Hypertension secondary to endocrine disorders: Secondary | ICD-10-CM

## 2014-03-09 DIAGNOSIS — I442 Atrioventricular block, complete: Secondary | ICD-10-CM

## 2014-03-09 DIAGNOSIS — Z950 Presence of cardiac pacemaker: Secondary | ICD-10-CM

## 2014-03-09 DIAGNOSIS — E1169 Type 2 diabetes mellitus with other specified complication: Secondary | ICD-10-CM

## 2014-03-09 DIAGNOSIS — I5032 Chronic diastolic (congestive) heart failure: Secondary | ICD-10-CM

## 2014-03-09 DIAGNOSIS — I10 Essential (primary) hypertension: Secondary | ICD-10-CM

## 2014-03-09 DIAGNOSIS — E1159 Type 2 diabetes mellitus with other circulatory complications: Secondary | ICD-10-CM

## 2014-03-09 LAB — MDC_IDC_ENUM_SESS_TYPE_INCLINIC
Battery Remaining Longevity: 126 mo
Brady Statistic AP VP Percent: 55 %
Brady Statistic AS VP Percent: 45 %
Date Time Interrogation Session: 20150805095633
Lead Channel Impedance Value: 562 Ohm
Lead Channel Impedance Value: 766 Ohm
Lead Channel Pacing Threshold Amplitude: 1.75 V
Lead Channel Sensing Intrinsic Amplitude: 2.8 mV
Lead Channel Setting Pacing Amplitude: 2.5 V
MDC IDC MSMT BATTERY IMPEDANCE: 110 Ohm
MDC IDC MSMT BATTERY VOLTAGE: 2.79 V
MDC IDC MSMT LEADCHNL RA PACING THRESHOLD PULSEWIDTH: 0.76 ms
MDC IDC MSMT LEADCHNL RV PACING THRESHOLD AMPLITUDE: 0.5 V
MDC IDC MSMT LEADCHNL RV PACING THRESHOLD PULSEWIDTH: 0.4 ms
MDC IDC SET LEADCHNL RV PACING AMPLITUDE: 2.5 V
MDC IDC SET LEADCHNL RV PACING PULSEWIDTH: 0.4 ms
MDC IDC SET LEADCHNL RV SENSING SENSITIVITY: 2.8 mV
MDC IDC STAT BRADY AP VS PERCENT: 0 %
MDC IDC STAT BRADY AS VS PERCENT: 0 %

## 2014-03-09 MED ORDER — LABETALOL HCL 200 MG PO TABS: 400.0000 mg | ORAL_TABLET | Freq: Two times a day (BID) | ORAL | Status: DC

## 2014-03-09 NOTE — Progress Notes (Signed)
Patient Care Team: Denita Lung, MD as PCP - General   HPI  Malik Rojas is a 80 y.o. male seen in followup for pacemaker implantation for high-grade heart block and presyncope.  March 2014 left heart cath showed no significant coronary disease;  ejection fraction by cath was 50-55% as opposed to a noninvasive imaging. This had measured an ejection fraction of 25%. He was treated for heart failure with IV Lasix and BiPAP.    He underwent pacemaker generator replacement 6/14  He is coming in today in addition for surgical clearance. He is having problems with urination and is apparently scheduled to undergo urethral dilatation  He is able to walk 2-300 feet. He carries groceries some. He has no stairs.  Past Medical History  Diagnosis Date  . Complete heart block     Status post Medtronic pacemaker  . Diabetes mellitus   . HTN (hypertension)   . OSA (obstructive sleep apnea)   . Edema   . Obesity   . Chronic combined systolic and diastolic heart failure     Ejection fraction 55% with elevated end-diastolic pressure-catheterization 3/14  . HLD (hyperlipidemia)   . CKD (chronic kidney disease)   . Hx of cardiovascular stress test     a. [place] MV 3/14:  Low risk, EF 36%, apical inf scar, no ischemia, inf-apical HK>> catheterization however demonstrated normal left ventricular function  . Cardiac pacemaker Medtronic   . CAD (coronary artery disease)     LHC 3/14: Distal left main 10%, LAD 20%, circumflex 10-20%, proximal RCA 20%, distal 40-50%, EF 50-55%.    Past Surgical History  Procedure Laterality Date  . Pacemaker insertion      medtronic EnRhythm  . Appendectomy      Current Outpatient Prescriptions  Medication Sig Dispense Refill  . Artificial Saliva (BIOTENE MOISTURIZING MOUTH) SOLN Use as directed 2 sprays in the mouth or throat 2 (two) times daily.  44.3 mL  11  . aspirin 81 MG tablet Take 81 mg by mouth daily.      Marland Kitchen Fesoterodine Fumarate (TOVIAZ  PO) Take by mouth.      . furosemide (LASIX) 80 MG tablet Take 0.5 tablets (40 mg total) by mouth daily.      Marland Kitchen glimepiride (AMARYL) 1 MG tablet TAKE 1 TABLET BY MOUTH TWICE DAILY  60 tablet  0  . JANUMET 50-1000 MG per tablet TAKE 1 TABLET BY MOUTH TWICE DAILY WITH A MEAL  60 tablet  3  . labetalol (NORMODYNE) 200 MG tablet Take 1 tablet (200 mg total) by mouth 2 (two) times daily.  120 tablet  5  . lisinopril (PRINIVIL,ZESTRIL) 10 MG tablet TAKE 1 TABLET BY MOUTH TWICE DAILY  60 tablet  3  . Omega-3 Fatty Acids (FISH OIL) 1000 MG CAPS Take 1,000 mg by mouth daily.       . potassium chloride SA (K-DUR,KLOR-CON) 20 MEQ tablet Take 0.5 tablets (10 mEq total) by mouth daily.      . pravastatin (PRAVACHOL) 80 MG tablet TAKE 1 TABLET BY MOUTH EVERY DAY  30 tablet  2  . tamsulosin (FLOMAX) 0.4 MG CAPS capsule TAKE 1 CAPSULE BY MOUTH TWICE DAILY  60 capsule  0   No current facility-administered medications for this visit.    No Known Allergies  Review of Systems negative except from HPI and PMH  Physical Exam BP 189/86  Pulse 81  Ht 5\' 10"  (1.778 m)  Wt 226 lb (  102.513 kg)  BMI 32.43 kg/m2 Well developed and well nourished in no acute distress HENT normal E scleral and icterus clear Neck Supple JVP 5-6 cm with HJR; carotids brisk and full Clear to ausculation  2/6 early systolic murmur  Regular rate and rhythm,  Soft with active bowel sounds No clubbing cyanosis 1+ Edema Alert and oriented, grossly normal motor and sensory function Skin Warm and Dry    Assessment and  Plan  Complete heart block-pacemaker dependent   Pacemaker-Medtronic  The patient's device was interrogated.  The information was reviewed. No changes were made in the programming.    Hypertension   HFpEF   Preoperative surgical evaluation   His blood pressure is poorly controlled. We'll have him increase his labetalol 200--400 mg twice a day.   We will continue his current dose of diuretics.   I think  his risks for surgical procedure are acceptable. We'll hold his diuretics morning of the procedure;  should continue his other medications.

## 2014-03-09 NOTE — Patient Instructions (Signed)
Your physician has recommended you make the following change in your medication:  1) INCREASE Labetalol to 400 mg twice daily  Remote monitoring is used to monitor your Pacemaker of ICD from home. This monitoring reduces the number of office visits required to check your device to one time per year. It allows [place] to keep an eye on the functioning of your device to ensure it is working properly. You are scheduled for a device check from home on 06/10/14. You may send your transmission at any time that day. If you have a wireless device, the transmission will be sent automatically. After your physician reviews your transmission, you will receive a postcard with your next transmission date.  Your physician wants you to follow-up in: 1 year with Dr. Caryl Comes.  You will receive a reminder letter in the mail two months in advance. If you don't receive a letter, please call our office to schedule the follow-up appointment.

## 2014-03-11 ENCOUNTER — Other Ambulatory Visit: Payer: Self-pay | Admitting: Family Medicine

## 2014-03-15 ENCOUNTER — Other Ambulatory Visit: Payer: Self-pay | Admitting: Family Medicine

## 2014-03-21 ENCOUNTER — Encounter: Payer: Self-pay | Admitting: Family Medicine

## 2014-03-21 ENCOUNTER — Ambulatory Visit (INDEPENDENT_AMBULATORY_CARE_PROVIDER_SITE_OTHER): Payer: Medicare Other | Admitting: Family Medicine

## 2014-03-21 VITALS — BP 140/90 | HR 71 | Wt 227.0 lb

## 2014-03-21 DIAGNOSIS — I152 Hypertension secondary to endocrine disorders: Secondary | ICD-10-CM

## 2014-03-21 DIAGNOSIS — I10 Essential (primary) hypertension: Secondary | ICD-10-CM

## 2014-03-21 DIAGNOSIS — N183 Chronic kidney disease, stage 3 unspecified: Secondary | ICD-10-CM

## 2014-03-21 DIAGNOSIS — E785 Hyperlipidemia, unspecified: Secondary | ICD-10-CM

## 2014-03-21 DIAGNOSIS — E669 Obesity, unspecified: Secondary | ICD-10-CM

## 2014-03-21 DIAGNOSIS — Z79899 Other long term (current) drug therapy: Secondary | ICD-10-CM

## 2014-03-21 DIAGNOSIS — Z950 Presence of cardiac pacemaker: Secondary | ICD-10-CM

## 2014-03-21 DIAGNOSIS — E1129 Type 2 diabetes mellitus with other diabetic kidney complication: Secondary | ICD-10-CM

## 2014-03-21 DIAGNOSIS — E1159 Type 2 diabetes mellitus with other circulatory complications: Secondary | ICD-10-CM

## 2014-03-21 DIAGNOSIS — E1169 Type 2 diabetes mellitus with other specified complication: Secondary | ICD-10-CM

## 2014-03-21 DIAGNOSIS — E1121 Type 2 diabetes mellitus with diabetic nephropathy: Secondary | ICD-10-CM

## 2014-03-21 DIAGNOSIS — N058 Unspecified nephritic syndrome with other morphologic changes: Secondary | ICD-10-CM

## 2014-03-21 LAB — POCT GLYCOSYLATED HEMOGLOBIN (HGB A1C): HEMOGLOBIN A1C: 6

## 2014-03-21 NOTE — Progress Notes (Signed)
Subjective:    Malik Rojas is a 80 y.o. male who presents for follow-up of Type 2 diabetes mellitus.  He was recently seen by urology and a procedure was performed. He seems to be doing well with this. He continues on other medications listed in the chart. His physical activity is quite limited. Is not seen his eye doctor but does plan on making an appointment. He has had no falls. Psychologically he seems to be doing quite well. They have been married for roughly 3 years.  Home blood sugar AROUND 100 TEST 1 X DAY   Current symptoms/problems include NONE Daily foot checks:  Any foot concerns: NONE Last eye exam:  3/15 DIGGBY   Medication compliance: Good Current diet: NONE [place]  Current exercise:WALKING   Known diabetic complications: impotence and cardiovascular disease diabetic renal disease Cardiovascular risk factors: advanced age (older than 35 for men, 13 for women), diabetes mellitus, dyslipidemia, hypertension, male gender, obesity (BMI >= 30 kg/m2) and sedentary lifestyle   The following portions of the patient's history were reviewed and updated as appropriate: allergies, current medications, past family history, past medical history, past social history, past surgical history and problem list.  ROS as in subjective above    Objective:    BP 140/90  Pulse 71  Wt 227 lb (102.967 kg)  Filed Vitals:   03/21/14 1049  BP: 140/90  Pulse: 71    General appearence: alert, no distress, WD/WN    Lab Review Lab Results  Component Value Date   HGBA1C 5.9 05/25/2013   Lab Results  Component Value Date   CHOL 120 05/25/2013   HDL 37* 05/25/2013   LDLCALC 41 05/25/2013   TRIG 209* 05/25/2013   CHOLHDL 3.2 05/25/2013   No results found for this basenameDerl Rojas     Chemistry      Component Value Date/Time   NA 139 02/01/2013 1025   K 4.4 02/01/2013 1025   CL 101 02/01/2013 1025   CO2 28 02/01/2013 1025   BUN 30* 02/01/2013 1025   CREATININE 1.6* 02/01/2013 1025   CREATININE 1.23 09/28/2012 1108      Component Value Date/Time   CALCIUM 9.6 02/01/2013 1025   ALKPHOS 82 01/08/2013 2009   AST 17 01/08/2013 2009   ALT 10 01/08/2013 2009   BILITOT 0.9 01/08/2013 2009        Chemistry      Component Value Date/Time   NA 139 02/01/2013 1025   K 4.4 02/01/2013 1025   CL 101 02/01/2013 1025   CO2 28 02/01/2013 1025   BUN 30* 02/01/2013 1025   CREATININE 1.6* 02/01/2013 1025   CREATININE 1.23 09/28/2012 1108      Component Value Date/Time   CALCIUM 9.6 02/01/2013 1025   ALKPHOS 82 01/08/2013 2009   AST 17 01/08/2013 2009   ALT 10 01/08/2013 2009   BILITOT 0.9 01/08/2013 2009       Hemoglobin A1c 6.0  Assessment:   Encounter Diagnoses  Name Primary?  Malik Rojas Yes  . Obesity (BMI 30-39.9)   . Hypertension associated with diabetes   . Hyperlipidemia with target LDL less than 70   . Type 2 diabetes mellitus with diabetic nephropathy   . Chronic kidney disease, stage III (moderate)   . Encounter for long-term (current) use of other medications          Plan:    1.  Rx changes: none 2.  Education: Reviewed 'ABCs' of diabetes management (respective  goals in parentheses):  A1C (<7), blood pressure (<130/80), and cholesterol (LDL <100). 3.  Compliance at present is estimated to be good. Efforts to improve compliance (if necessary) will be directed at increased exercise. 4. Follow up:4 months

## 2014-03-22 LAB — COMPREHENSIVE METABOLIC PANEL
ALBUMIN: 4 g/dL (ref 3.5–5.2)
ALT: 8 U/L (ref 0–53)
AST: 12 U/L (ref 0–37)
Alkaline Phosphatase: 65 U/L (ref 39–117)
BUN: 21 mg/dL (ref 6–23)
CALCIUM: 9 mg/dL (ref 8.4–10.5)
CHLORIDE: 104 meq/L (ref 96–112)
CO2: 28 meq/L (ref 19–32)
Creat: 1.27 mg/dL (ref 0.50–1.35)
Glucose, Bld: 99 mg/dL (ref 70–99)
POTASSIUM: 4.5 meq/L (ref 3.5–5.3)
SODIUM: 140 meq/L (ref 135–145)
TOTAL PROTEIN: 6.4 g/dL (ref 6.0–8.3)
Total Bilirubin: 0.7 mg/dL (ref 0.2–1.2)

## 2014-03-22 LAB — LIPID PANEL
CHOL/HDL RATIO: 3.1 ratio
CHOLESTEROL: 119 mg/dL (ref 0–200)
HDL: 38 mg/dL — ABNORMAL LOW (ref >39)
LDL Cholesterol: 47 mg/dL (ref 0–99)
Triglycerides: 172 mg/dL — ABNORMAL HIGH (ref <150)
VLDL: 34 mg/dL (ref 0–40)

## 2014-03-22 LAB — CBC WITH DIFFERENTIAL/PLATELET
BASOS ABS: 0 10*3/uL (ref 0.0–0.1)
Basophils Relative: 0 % (ref 0–1)
EOS PCT: 3 % (ref 0–5)
Eosinophils Absolute: 0.3 10*3/uL (ref 0.0–0.7)
HCT: 42.4 % (ref 39.0–52.0)
Hemoglobin: 14.1 g/dL (ref 13.0–17.0)
LYMPHS ABS: 1.4 10*3/uL (ref 0.7–4.0)
LYMPHS PCT: 16 % (ref 12–46)
MCH: 30.1 pg (ref 26.0–34.0)
MCHC: 33.3 g/dL (ref 30.0–36.0)
MCV: 90.4 fL (ref 78.0–100.0)
Monocytes Absolute: 0.7 10*3/uL (ref 0.1–1.0)
Monocytes Relative: 8 % (ref 3–12)
NEUTROS ABS: 6.3 10*3/uL (ref 1.7–7.7)
Neutrophils Relative %: 73 % (ref 43–77)
PLATELETS: 209 10*3/uL (ref 150–400)
RBC: 4.69 MIL/uL (ref 4.22–5.81)
RDW: 14.5 % (ref 11.5–15.5)
WBC: 8.6 10*3/uL (ref 4.0–10.5)

## 2014-03-26 ENCOUNTER — Other Ambulatory Visit: Payer: Self-pay | Admitting: Family Medicine

## 2014-03-29 ENCOUNTER — Telehealth: Payer: Self-pay | Admitting: *Deleted

## 2014-03-29 NOTE — Telephone Encounter (Signed)
Faxed clearance to [facility] for [place] microwave/cooled thermotherapy. Instructed to hold diuretics morning of procedure.

## 2014-04-21 ENCOUNTER — Telehealth: Payer: Self-pay | Admitting: Internal Medicine

## 2014-04-21 NOTE — Telephone Encounter (Signed)
Pt uses [facility] for his diabetic supplies

## 2014-04-25 ENCOUNTER — Telehealth: Payer: Self-pay | Admitting: Family Medicine

## 2014-04-25 ENCOUNTER — Other Ambulatory Visit: Payer: Self-pay

## 2014-04-25 MED ORDER — GLUCOSE BLOOD VI STRP: 1.0000 | ORAL_STRIP | Freq: Two times a day (BID) | Status: DC

## 2014-04-25 MED ORDER — LANCETS MISC: 1.0000 | Freq: Two times a day (BID) | Status: DC

## 2014-04-25 NOTE — Telephone Encounter (Signed)
WHAT KIND OF LANCETS

## 2014-04-25 NOTE — Telephone Encounter (Signed)
Malik Rojas is requesting Lancets refill box of 100      90 day supply

## 2014-04-25 NOTE — Telephone Encounter (Signed)
CALLED TO MAKE SURE WHAT BRAND HE USED

## 2014-04-25 NOTE — Telephone Encounter (Signed)
DONE ACCUCHECK

## 2014-06-07 ENCOUNTER — Other Ambulatory Visit: Payer: Self-pay | Admitting: Internal Medicine

## 2014-06-10 ENCOUNTER — Ambulatory Visit (INDEPENDENT_AMBULATORY_CARE_PROVIDER_SITE_OTHER): Payer: Medicare Other | Admitting: *Deleted

## 2014-06-10 ENCOUNTER — Telehealth: Payer: Self-pay | Admitting: Cardiology

## 2014-06-10 DIAGNOSIS — I442 Atrioventricular block, complete: Secondary | ICD-10-CM

## 2014-06-10 DIAGNOSIS — R55 Syncope and collapse: Secondary | ICD-10-CM

## 2014-06-10 NOTE — Telephone Encounter (Signed)
LMOVM reminding pt to send remote transmission.   

## 2014-06-13 NOTE — Progress Notes (Signed)
Remote pacemaker transmission.   

## 2014-06-15 LAB — MDC_IDC_ENUM_SESS_TYPE_REMOTE
Battery Remaining Longevity: 115 mo
Brady Statistic AP VP Percent: 66 %
Brady Statistic AS VP Percent: 34 %
Lead Channel Impedance Value: 562 Ohm
Lead Channel Impedance Value: 763 Ohm
Lead Channel Setting Pacing Amplitude: 2.5 V
Lead Channel Setting Pacing Pulse Width: 0.4 ms
MDC IDC MSMT BATTERY IMPEDANCE: 134 Ohm
MDC IDC MSMT BATTERY VOLTAGE: 2.79 V
MDC IDC MSMT LEADCHNL RV PACING THRESHOLD AMPLITUDE: 0.5 V
MDC IDC MSMT LEADCHNL RV PACING THRESHOLD PULSEWIDTH: 0.4 ms
MDC IDC SESS DTM: 20151109013203
MDC IDC SET LEADCHNL RV PACING AMPLITUDE: 2.5 V
MDC IDC SET LEADCHNL RV SENSING SENSITIVITY: 2.8 mV
MDC IDC STAT BRADY AP VS PERCENT: 0 %
MDC IDC STAT BRADY AS VS PERCENT: 0 %

## 2014-06-27 ENCOUNTER — Telehealth: Payer: Self-pay | Admitting: Family Medicine

## 2014-06-27 NOTE — Telephone Encounter (Signed)
Pt stopped by office needs refills Janumet 50/1000 to [facility] [phone],  2 packs of samples given (OK per JCL) to hold til gets refill

## 2014-07-05 NOTE — Telephone Encounter (Signed)
Called [facility] & pt's application has expired.  They will mail him new application.  Pt will bring it by office when completed, samples given

## 2014-07-06 ENCOUNTER — Encounter: Payer: Self-pay | Admitting: Cardiology

## 2014-07-11 ENCOUNTER — Encounter: Payer: Self-pay | Admitting: Internal Medicine

## 2014-07-14 ENCOUNTER — Encounter (HOSPITAL_COMMUNITY): Payer: Self-pay | Admitting: Internal Medicine

## 2014-07-14 NOTE — Telephone Encounter (Signed)
Left message for pt

## 2014-07-22 ENCOUNTER — Ambulatory Visit (INDEPENDENT_AMBULATORY_CARE_PROVIDER_SITE_OTHER): Payer: Medicare Other | Admitting: Family Medicine

## 2014-07-22 ENCOUNTER — Encounter: Payer: Self-pay | Admitting: Family Medicine

## 2014-07-22 VITALS — BP 130/88 | HR 70 | Wt 222.0 lb

## 2014-07-22 DIAGNOSIS — E1169 Type 2 diabetes mellitus with other specified complication: Secondary | ICD-10-CM

## 2014-07-22 DIAGNOSIS — E785 Hyperlipidemia, unspecified: Secondary | ICD-10-CM

## 2014-07-22 DIAGNOSIS — Z23 Encounter for immunization: Secondary | ICD-10-CM

## 2014-07-22 DIAGNOSIS — E669 Obesity, unspecified: Secondary | ICD-10-CM

## 2014-07-22 DIAGNOSIS — I10 Essential (primary) hypertension: Secondary | ICD-10-CM

## 2014-07-22 DIAGNOSIS — E1121 Type 2 diabetes mellitus with diabetic nephropathy: Secondary | ICD-10-CM

## 2014-07-22 DIAGNOSIS — E1159 Type 2 diabetes mellitus with other circulatory complications: Secondary | ICD-10-CM

## 2014-07-22 DIAGNOSIS — G4733 Obstructive sleep apnea (adult) (pediatric): Secondary | ICD-10-CM

## 2014-07-22 LAB — POCT UA - MICROALBUMIN: CREATININE, POC: 74.4 mg/dL

## 2014-07-22 LAB — POCT GLYCOSYLATED HEMOGLOBIN (HGB A1C): Hemoglobin A1C: 6.2

## 2014-07-22 MED ORDER — LISINOPRIL 10 MG PO TABS: 10.0000 mg | ORAL_TABLET | Freq: Every day | ORAL | Status: DC

## 2014-07-22 NOTE — Progress Notes (Signed)
   Subjective:    Patient ID: Malik Rojas, male    DOB: Jul 18, 1932, 80 y.o.   MRN: [id number]  HPI He is here for recheck. He continues to be followed very closely by cardiology for his pacemaker. He continues on medications listed in the chart. There were reviewed. It did show that he is taking twice a day dosing of lisinopril. He does have underlying OSA but is not using CPAP due to difficulty with the device. His exercise is quite limited. His wife takes good care of him as well as checking his feet. He has had some lower extremity issues recently but they are improving. Social history including smoking and drinking was reviewed. His physical activity is quite limited due to his deconditioning and heart. He has no particular concerns or complaints   Review of Systems  All other systems reviewed and are negative.      Objective:   Physical Exam alert and in no distress. Exam of the lower extremities does show hyperpigmentation and slight drying of the skin. He will alone A1c 6.2      Assessment & Plan:  Type 2 diabetes mellitus with diabetic nephropathy - Plan: POCT UA - Microalbumin, POCT glycosylated hemoglobin (Hb A1C)  Hypertension associated with diabetes - Plan: lisinopril (PRINIVIL,ZESTRIL) 10 MG tablet  Obesity (BMI 30-39.9)  Obstructive sleep apnea  Need for prophylactic vaccination against Streptococcus pneumoniae (pneumococcus) - Plan: Pneumococcal conjugate vaccine 13-valent  Hyperlipidemia associated with type 2 diabetes mellitus  I will have him stop his Amaryl and take the lisinopril once per day. He is to continue on all his other medications. He is not interested in pursuing CPAP anymore. His immunizations were updated. Recheck here 4 months.

## 2014-07-22 NOTE — Patient Instructions (Addendum)
Take 1 lisinopril per day Stop the glimepiride

## 2014-08-25 ENCOUNTER — Telehealth: Payer: Self-pay | Admitting: Internal Medicine

## 2014-08-25 ENCOUNTER — Other Ambulatory Visit: Payer: Self-pay

## 2014-08-25 MED ORDER — SITAGLIPTIN PHOS-METFORMIN HCL 50-1000 MG PO TABS: ORAL_TABLET | ORAL | Status: DC

## 2014-08-25 NOTE — Telephone Encounter (Signed)
Done

## 2014-08-25 NOTE — Telephone Encounter (Signed)
Refill request for janumet 50-1000mg  to [facility]

## 2014-09-14 ENCOUNTER — Ambulatory Visit (INDEPENDENT_AMBULATORY_CARE_PROVIDER_SITE_OTHER): Payer: Medicare Other | Admitting: *Deleted

## 2014-09-14 DIAGNOSIS — I442 Atrioventricular block, complete: Secondary | ICD-10-CM

## 2014-09-14 LAB — MDC_IDC_ENUM_SESS_TYPE_REMOTE
Battery Impedance: 134 Ohm
Battery Remaining Longevity: 115 mo
Battery Voltage: 2.79 V
Brady Statistic AP VP Percent: 67 %
Brady Statistic AP VS Percent: 0 %
Brady Statistic AS VP Percent: 33 %
Date Time Interrogation Session: 20160210113037
Lead Channel Impedance Value: 562 Ohm
Lead Channel Pacing Threshold Amplitude: 0.5 V
Lead Channel Pacing Threshold Pulse Width: 0.4 ms
Lead Channel Setting Pacing Amplitude: 2.5 V
Lead Channel Setting Pacing Amplitude: 2.5 V
Lead Channel Setting Pacing Pulse Width: 0.4 ms
Lead Channel Setting Sensing Sensitivity: 2.8 mV
MDC IDC MSMT LEADCHNL RA PACING THRESHOLD AMPLITUDE: HIGH
MDC IDC MSMT LEADCHNL RA SENSING INTR AMPL: 2.8 mV
MDC IDC MSMT LEADCHNL RV IMPEDANCE VALUE: 781 Ohm
MDC IDC STAT BRADY AS VS PERCENT: 0 %

## 2014-09-14 NOTE — Progress Notes (Signed)
Remote pacemaker transmission.   

## 2014-09-16 ENCOUNTER — Other Ambulatory Visit: Payer: Self-pay | Admitting: Family Medicine

## 2014-09-23 ENCOUNTER — Other Ambulatory Visit: Payer: Self-pay | Admitting: Urology

## 2014-10-03 ENCOUNTER — Encounter: Payer: Self-pay | Admitting: Cardiology

## 2014-10-07 NOTE — Patient Instructions (Addendum)
Malik Rojas  10/07/2014   Your procedure is scheduled on: 10/13/2014    Report to [facility] Main  Entrance and follow signs to               [place] at       0830 AM.  Call this number if you have problems the morning of surgery [phone]   Remember:  Do not eat food or drink liquids :After Midnight.     Take these medicines the morning of surgery with A SIP OF WATER: pravastatin, labetalol                               You may not have any metal on your body including hair pins and              piercings  Do not wear jewelry,  lotions, powders or perfumes., deodorant.               Men may shave face and neck.  Do not bring valuables to the hospital. [place].  Contacts, dentures or bridgework may not be worn into surgery.  Leave suitcase in the car. After surgery it may be brought to your room.  : _____________________________________________________________________             [facility] - Preparing for Surgery Before surgery, you can play an important role.  Because skin is not sterile, your skin needs to be as free of germs as possible.  You can reduce the number of germs on your skin by washing with CHG (chlorahexidine gluconate) soap before surgery.  CHG is an antiseptic cleaner which kills germs and bonds with the skin to continue killing germs even after washing. Please DO NOT use if you have an allergy to CHG or antibacterial soaps.  If your skin becomes reddened/irritated stop using the CHG and inform your nurse when you arrive at Short Stay. Do not shave (including legs and underarms) for at least 48 hours prior to the first CHG shower.  You may shave your face/neck. Please follow these instructions carefully:  1.  Shower with CHG Soap the night before surgery and the  morning of Surgery.  2.  If you choose to wash your hair, wash your hair first as usual with your  normal   shampoo.  3.  After you shampoo, rinse your hair and body thoroughly to remove the  shampoo.                            4.  Use CHG as you would any other liquid soap.  You can apply chg directly  to the skin and wash                       Gently with a scrungie or clean washcloth.  5.  Apply the CHG Soap to your body ONLY FROM THE NECK DOWN.   Do not use on face/ open                           Wound or open sores. Avoid contact with eyes, ears mouth and genitals (  private parts).                       Wash face,  Genitals (private parts) with your normal soap.             6.  Wash thoroughly, paying special attention to the area where your surgery  will be performed.  7.  Thoroughly rinse your body with warm water from the neck down.  8.  DO NOT shower/wash with your normal soap after using and rinsing off  the CHG Soap.                9.  Pat yourself dry with a clean towel.            10.  Wear clean pajamas.            11.  Place clean sheets on your bed the night of your first shower and do not  sleep with pets. Day of Surgery : Do not apply any lotions/deodorants the morning of surgery.  Please wear clean clothes to the hospital/surgery center.  FAILURE TO FOLLOW THESE INSTRUCTIONS MAY RESULT IN THE CANCELLATION OF YOUR SURGERY PATIENT SIGNATURE_________________________________  NURSE SIGNATURE__________________________________  ________________________________________________________________________

## 2014-10-10 ENCOUNTER — Encounter (HOSPITAL_COMMUNITY): Payer: Self-pay | Admitting: General Practice

## 2014-10-10 ENCOUNTER — Encounter: Payer: Self-pay | Admitting: Internal Medicine

## 2014-10-10 ENCOUNTER — Encounter (HOSPITAL_COMMUNITY)
Admission: RE | Admit: 2014-10-10 | Discharge: 2014-10-10 | Disposition: A | Discharge status: Home / Self Care | Payer: Medicare Other | Source: Ambulatory Visit | Attending: Urology | Admitting: Urology

## 2014-10-10 DIAGNOSIS — G4730 Sleep apnea, unspecified: Secondary | ICD-10-CM | POA: Diagnosis not present

## 2014-10-10 DIAGNOSIS — I10 Essential (primary) hypertension: Secondary | ICD-10-CM | POA: Diagnosis not present

## 2014-10-10 DIAGNOSIS — Z950 Presence of cardiac pacemaker: Secondary | ICD-10-CM | POA: Diagnosis not present

## 2014-10-10 DIAGNOSIS — I5030 Unspecified diastolic (congestive) heart failure: Secondary | ICD-10-CM | POA: Diagnosis not present

## 2014-10-10 DIAGNOSIS — R351 Nocturia: Secondary | ICD-10-CM | POA: Diagnosis not present

## 2014-10-10 DIAGNOSIS — N3941 Urge incontinence: Secondary | ICD-10-CM | POA: Diagnosis not present

## 2014-10-10 DIAGNOSIS — Z87891 Personal history of nicotine dependence: Secondary | ICD-10-CM | POA: Diagnosis not present

## 2014-10-10 DIAGNOSIS — Z79899 Other long term (current) drug therapy: Secondary | ICD-10-CM | POA: Diagnosis not present

## 2014-10-10 DIAGNOSIS — E119 Type 2 diabetes mellitus without complications: Secondary | ICD-10-CM | POA: Diagnosis not present

## 2014-10-10 DIAGNOSIS — N401 Enlarged prostate with lower urinary tract symptoms: Secondary | ICD-10-CM | POA: Diagnosis present

## 2014-10-10 DIAGNOSIS — Z7982 Long term (current) use of aspirin: Secondary | ICD-10-CM | POA: Diagnosis not present

## 2014-10-10 DIAGNOSIS — N138 Other obstructive and reflux uropathy: Secondary | ICD-10-CM | POA: Diagnosis not present

## 2014-10-10 DIAGNOSIS — R350 Frequency of micturition: Secondary | ICD-10-CM | POA: Diagnosis not present

## 2014-10-10 LAB — CBC
HCT: 43.1 % (ref 39.0–52.0)
Hemoglobin: 14.1 g/dL (ref 13.0–17.0)
MCH: 30.8 pg (ref 26.0–34.0)
MCHC: 32.7 g/dL (ref 30.0–36.0)
MCV: 94.1 fL (ref 78.0–100.0)
Platelets: 180 10*3/uL (ref 150–400)
RBC: 4.58 MIL/uL (ref 4.22–5.81)
RDW: 13.5 % (ref 11.5–15.5)
WBC: 8.2 10*3/uL (ref 4.0–10.5)

## 2014-10-10 LAB — BASIC METABOLIC PANEL
Anion gap: 7 (ref 5–15)
BUN: 16 mg/dL (ref 6–23)
CALCIUM: 9.1 mg/dL (ref 8.4–10.5)
CO2: 28 mmol/L (ref 19–32)
Chloride: 106 mmol/L (ref 96–112)
Creatinine, Ser: 1.29 mg/dL (ref 0.50–1.35)
GFR calc Af Amer: 59 mL/min — ABNORMAL LOW (ref >90)
GFR calc non Af Amer: 51 mL/min — ABNORMAL LOW (ref >90)
GLUCOSE: 153 mg/dL — AB (ref 70–99)
Potassium: 4.2 mmol/L (ref 3.5–5.1)
SODIUM: 141 mmol/L (ref 135–145)

## 2014-10-10 LAB — ABO/RH: ABO/RH(D): A POS

## 2014-10-10 NOTE — Progress Notes (Signed)
Perioperative prescription for implanted cardiac device form on chart, EKG 03/08/14 on EPIC, last device check 09/14/14 on EPIC, stress test and ECHO 2014 on EPIC, LOV note with clearance 03/09/14 Dr. Caryl Comes on [facility]

## 2014-10-13 ENCOUNTER — Observation Stay (HOSPITAL_COMMUNITY)
Admission: RE | Admit: 2014-10-13 | Discharge: 2014-10-14 | Disposition: A | Discharge status: Home / Self Care | Payer: Medicare Other | Source: Ambulatory Visit | Attending: Urology | Admitting: Urology

## 2014-10-13 ENCOUNTER — Encounter (HOSPITAL_COMMUNITY): Payer: Self-pay | Admitting: *Deleted

## 2014-10-13 ENCOUNTER — Encounter (HOSPITAL_COMMUNITY)
Admission: RE | Disposition: A | Discharge status: Home / Self Care | Payer: Self-pay | Source: Ambulatory Visit | Attending: Urology

## 2014-10-13 ENCOUNTER — Ambulatory Visit (HOSPITAL_COMMUNITY): Payer: Medicare Other | Admitting: Anesthesiology

## 2014-10-13 DIAGNOSIS — G4730 Sleep apnea, unspecified: Secondary | ICD-10-CM | POA: Insufficient documentation

## 2014-10-13 DIAGNOSIS — N3941 Urge incontinence: Secondary | ICD-10-CM | POA: Diagnosis not present

## 2014-10-13 DIAGNOSIS — Z79899 Other long term (current) drug therapy: Secondary | ICD-10-CM | POA: Insufficient documentation

## 2014-10-13 DIAGNOSIS — I5030 Unspecified diastolic (congestive) heart failure: Secondary | ICD-10-CM | POA: Insufficient documentation

## 2014-10-13 DIAGNOSIS — N401 Enlarged prostate with lower urinary tract symptoms: Secondary | ICD-10-CM | POA: Diagnosis not present

## 2014-10-13 DIAGNOSIS — Z87891 Personal history of nicotine dependence: Secondary | ICD-10-CM | POA: Insufficient documentation

## 2014-10-13 DIAGNOSIS — N138 Other obstructive and reflux uropathy: Secondary | ICD-10-CM | POA: Diagnosis not present

## 2014-10-13 DIAGNOSIS — R350 Frequency of micturition: Secondary | ICD-10-CM | POA: Diagnosis not present

## 2014-10-13 DIAGNOSIS — R3912 Poor urinary stream: Secondary | ICD-10-CM

## 2014-10-13 DIAGNOSIS — Z950 Presence of cardiac pacemaker: Secondary | ICD-10-CM | POA: Insufficient documentation

## 2014-10-13 DIAGNOSIS — R351 Nocturia: Secondary | ICD-10-CM | POA: Insufficient documentation

## 2014-10-13 DIAGNOSIS — E119 Type 2 diabetes mellitus without complications: Secondary | ICD-10-CM | POA: Insufficient documentation

## 2014-10-13 DIAGNOSIS — I10 Essential (primary) hypertension: Secondary | ICD-10-CM | POA: Insufficient documentation

## 2014-10-13 DIAGNOSIS — Z7982 Long term (current) use of aspirin: Secondary | ICD-10-CM | POA: Insufficient documentation

## 2014-10-13 DIAGNOSIS — N400 Enlarged prostate without lower urinary tract symptoms: Secondary | ICD-10-CM

## 2014-10-13 HISTORY — DX: Malignant melanoma of skin, unspecified: C43.9

## 2014-10-13 HISTORY — DX: Presence of cardiac pacemaker: Z95.0

## 2014-10-13 HISTORY — PX: TRANSURETHRAL RESECTION OF PROSTATE: SHX73

## 2014-10-13 HISTORY — DX: Pneumonia, unspecified organism: J18.9

## 2014-10-13 LAB — GLUCOSE, CAPILLARY
GLUCOSE-CAPILLARY: 142 mg/dL — AB (ref 70–99)
GLUCOSE-CAPILLARY: 140 mg/dL — AB (ref 70–99)
Glucose-Capillary: 115 mg/dL — ABNORMAL HIGH (ref 70–99)
Glucose-Capillary: 130 mg/dL — ABNORMAL HIGH (ref 70–99)

## 2014-10-13 LAB — TYPE AND SCREEN
ABO/RH(D): A POS
ANTIBODY SCREEN: NEGATIVE

## 2014-10-13 SURGERY — TURP (TRANSURETHRAL RESECTION OF PROSTATE)
Anesthesia: General

## 2014-10-13 MED ORDER — POTASSIUM CHLORIDE CRYS ER 10 MEQ PO TBCR
10.0000 meq | EXTENDED_RELEASE_TABLET | Freq: Every day | ORAL | Status: DC
  Administered 2014-10-13 – 2014-10-14 (×2): 10 meq via ORAL
  Filled 2014-10-13 (×2): qty 1

## 2014-10-13 MED ORDER — ACETAMINOPHEN 325 MG PO TABS: 650.0000 mg | ORAL_TABLET | ORAL | Status: DC | PRN

## 2014-10-13 MED ORDER — LINAGLIPTIN 5 MG PO TABS
5.0000 mg | ORAL_TABLET | Freq: Every day | ORAL | Status: DC
  Administered 2014-10-14: 5 mg via ORAL
  Filled 2014-10-13: qty 1

## 2014-10-13 MED ORDER — PHENYLEPHRINE HCL 10 MG/ML IJ SOLN
INTRAMUSCULAR | Status: DC | PRN
  Administered 2014-10-13 (×4): 40 ug via INTRAVENOUS

## 2014-10-13 MED ORDER — ONDANSETRON HCL 4 MG/2ML IJ SOLN: 4.0000 mg | INTRAMUSCULAR | Status: DC | PRN

## 2014-10-13 MED ORDER — METFORMIN HCL 500 MG PO TABS
1000.0000 mg | ORAL_TABLET | Freq: Two times a day (BID) | ORAL | Status: DC
  Administered 2014-10-13 – 2014-10-14 (×2): 1000 mg via ORAL
  Filled 2014-10-13 (×3): qty 2

## 2014-10-13 MED ORDER — LABETALOL HCL 200 MG PO TABS
200.0000 mg | ORAL_TABLET | Freq: Once | ORAL | Status: AC
  Administered 2014-10-13: 200 mg via ORAL
  Filled 2014-10-13: qty 1

## 2014-10-13 MED ORDER — BELLADONNA ALKALOIDS-OPIUM 16.2-60 MG RE SUPP
1.0000 | Freq: Once | RECTAL | Status: AC
  Administered 2014-10-13: 1 via RECTAL
  Filled 2014-10-13: qty 1

## 2014-10-13 MED ORDER — SODIUM CHLORIDE 0.45 % IV SOLN
INTRAVENOUS | Status: DC
  Administered 2014-10-13 – 2014-10-14 (×2): via INTRAVENOUS

## 2014-10-13 MED ORDER — FENTANYL CITRATE 0.05 MG/ML IJ SOLN
INTRAMUSCULAR | Status: DC | PRN
  Administered 2014-10-13 (×4): 25 ug via INTRAVENOUS

## 2014-10-13 MED ORDER — GLUCOSE BLOOD VI STRP: 1.0000 | ORAL_STRIP | Freq: Two times a day (BID) | Status: DC

## 2014-10-13 MED ORDER — SITAGLIPTIN PHOS-METFORMIN HCL 50-1000 MG PO TABS: 1.0000 | ORAL_TABLET | Freq: Two times a day (BID) | ORAL | Status: DC

## 2014-10-13 MED ORDER — OXYCODONE-ACETAMINOPHEN 5-325 MG PO TABS
1.0000 | ORAL_TABLET | ORAL | Status: DC | PRN
  Administered 2014-10-14: 2 via ORAL
  Filled 2014-10-13: qty 2

## 2014-10-13 MED ORDER — PRAVASTATIN SODIUM 80 MG PO TABS
80.0000 mg | ORAL_TABLET | Freq: Every day | ORAL | Status: DC
  Administered 2014-10-13 – 2014-10-14 (×2): 80 mg via ORAL
  Filled 2014-10-13 (×2): qty 1

## 2014-10-13 MED ORDER — FENTANYL CITRATE 0.05 MG/ML IJ SOLN: 25.0000 ug | INTRAMUSCULAR | Status: DC | PRN

## 2014-10-13 MED ORDER — BELLADONNA ALKALOIDS-OPIUM 16.2-60 MG RE SUPP
RECTAL | Status: DC | PRN
  Administered 2014-10-13: 0.5 via RECTAL

## 2014-10-13 MED ORDER — TRAMADOL-ACETAMINOPHEN 37.5-325 MG PO TABS: 1.0000 | ORAL_TABLET | Freq: Four times a day (QID) | ORAL | Status: DC | PRN

## 2014-10-13 MED ORDER — CEFAZOLIN SODIUM-DEXTROSE 2-3 GM-% IV SOLR
2.0000 g | INTRAVENOUS | Status: AC
  Administered 2014-10-13: 2 g via INTRAVENOUS

## 2014-10-13 MED ORDER — SENNOSIDES-DOCUSATE SODIUM 8.6-50 MG PO TABS
2.0000 | ORAL_TABLET | Freq: Every day | ORAL | Status: DC
  Administered 2014-10-13: 2 via ORAL
  Filled 2014-10-13: qty 2

## 2014-10-13 MED ORDER — DIPHENHYDRAMINE HCL 50 MG/ML IJ SOLN: 12.5000 mg | Freq: Four times a day (QID) | INTRAMUSCULAR | Status: DC | PRN

## 2014-10-13 MED ORDER — PROPOFOL 10 MG/ML IV BOLUS
INTRAVENOUS | Status: AC
  Filled 2014-10-13: qty 20

## 2014-10-13 MED ORDER — DIPHENHYDRAMINE HCL 12.5 MG/5ML PO ELIX: 12.5000 mg | ORAL_SOLUTION | Freq: Four times a day (QID) | ORAL | Status: DC | PRN

## 2014-10-13 MED ORDER — PHENAZOPYRIDINE HCL 200 MG PO TABS: 200.0000 mg | ORAL_TABLET | Freq: Three times a day (TID) | ORAL | Status: DC | PRN

## 2014-10-13 MED ORDER — FENTANYL CITRATE 0.05 MG/ML IJ SOLN
INTRAMUSCULAR | Status: AC
  Filled 2014-10-13: qty 2

## 2014-10-13 MED ORDER — BELLADONNA ALKALOIDS-OPIUM 16.2-60 MG RE SUPP
RECTAL | Status: AC
  Filled 2014-10-13: qty 1

## 2014-10-13 MED ORDER — SODIUM CHLORIDE 0.9 % IR SOLN
Status: DC | PRN
  Administered 2014-10-13: 24000 mL via INTRAVESICAL

## 2014-10-13 MED ORDER — CIPROFLOXACIN HCL 500 MG PO TABS: 500.0000 mg | ORAL_TABLET | Freq: Two times a day (BID) | ORAL | Status: DC

## 2014-10-13 MED ORDER — ZOLPIDEM TARTRATE 5 MG PO TABS: 5.0000 mg | ORAL_TABLET | Freq: Every evening | ORAL | Status: DC | PRN

## 2014-10-13 MED ORDER — BIOTENE MOISTURIZING MOUTH MT SOLN
2.0000 | Freq: Two times a day (BID) | OROMUCOSAL | Status: DC
  Filled 2014-10-13: qty 44.3

## 2014-10-13 MED ORDER — LIP MEDEX EX OINT
TOPICAL_OINTMENT | CUTANEOUS | Status: DC | PRN
  Administered 2014-10-13: 1 via TOPICAL

## 2014-10-13 MED ORDER — PROMETHAZINE HCL 25 MG/ML IJ SOLN: 6.2500 mg | INTRAMUSCULAR | Status: DC | PRN

## 2014-10-13 MED ORDER — LABETALOL HCL 200 MG PO TABS
400.0000 mg | ORAL_TABLET | Freq: Two times a day (BID) | ORAL | Status: DC
  Administered 2014-10-14: 400 mg via ORAL
  Filled 2014-10-13 (×2): qty 2

## 2014-10-13 MED ORDER — LANCETS MISC: 1.0000 | Freq: Two times a day (BID) | Status: DC

## 2014-10-13 MED ORDER — FUROSEMIDE 40 MG PO TABS
40.0000 mg | ORAL_TABLET | Freq: Every day | ORAL | Status: DC
  Administered 2014-10-13 – 2014-10-14 (×2): 40 mg via ORAL
  Filled 2014-10-13 (×2): qty 1

## 2014-10-13 MED ORDER — PROPOFOL 10 MG/ML IV BOLUS
INTRAVENOUS | Status: DC | PRN
  Administered 2014-10-13: 130 mg via INTRAVENOUS

## 2014-10-13 MED ORDER — ONDANSETRON HCL 4 MG/2ML IJ SOLN
INTRAMUSCULAR | Status: AC
  Filled 2014-10-13: qty 4

## 2014-10-13 MED ORDER — BELLADONNA ALKALOIDS-OPIUM 16.2-60 MG RE SUPP: 1.0000 | Freq: Three times a day (TID) | RECTAL | Status: DC | PRN

## 2014-10-13 MED ORDER — LISINOPRIL 10 MG PO TABS
10.0000 mg | ORAL_TABLET | Freq: Every day | ORAL | Status: DC
  Administered 2014-10-13 – 2014-10-14 (×2): 10 mg via ORAL
  Filled 2014-10-13 (×2): qty 1

## 2014-10-13 MED ORDER — LACTATED RINGERS IV SOLN
INTRAVENOUS | Status: DC | PRN
  Administered 2014-10-13: 10:00:00 via INTRAVENOUS

## 2014-10-13 MED ORDER — CEFAZOLIN SODIUM-DEXTROSE 2-3 GM-% IV SOLR
INTRAVENOUS | Status: AC
  Filled 2014-10-13: qty 50

## 2014-10-13 MED ORDER — CIPROFLOXACIN HCL 500 MG PO TABS
500.0000 mg | ORAL_TABLET | Freq: Two times a day (BID) | ORAL | Status: DC
  Administered 2014-10-14: 500 mg via ORAL
  Filled 2014-10-13: qty 1

## 2014-10-13 MED ORDER — BACITRACIN-NEOMYCIN-POLYMYXIN 400-5-5000 EX OINT: 1.0000 "application " | TOPICAL_OINTMENT | Freq: Three times a day (TID) | CUTANEOUS | Status: DC | PRN

## 2014-10-13 MED ORDER — LIDOCAINE HCL (CARDIAC) 20 MG/ML IV SOLN
INTRAVENOUS | Status: AC
  Filled 2014-10-13: qty 5

## 2014-10-13 MED ORDER — HYDROMORPHONE HCL 1 MG/ML IJ SOLN
0.5000 mg | INTRAMUSCULAR | Status: DC | PRN
  Administered 2014-10-13 – 2014-10-14 (×2): 1 mg via INTRAVENOUS
  Filled 2014-10-13 (×2): qty 1

## 2014-10-13 MED ORDER — LIDOCAINE HCL (CARDIAC) 20 MG/ML IV SOLN
INTRAVENOUS | Status: DC | PRN
  Administered 2014-10-13: 100 mg via INTRAVENOUS

## 2014-10-13 MED ORDER — MEPERIDINE HCL 50 MG/ML IJ SOLN: 6.2500 mg | INTRAMUSCULAR | Status: DC | PRN

## 2014-10-13 MED ORDER — LIP MEDEX EX OINT
TOPICAL_OINTMENT | CUTANEOUS | Status: AC
  Filled 2014-10-13: qty 7

## 2014-10-13 MED ORDER — LABETALOL HCL 300 MG PO TABS: 300.0000 mg | ORAL_TABLET | Freq: Two times a day (BID) | ORAL | Status: DC

## 2014-10-13 SURGICAL SUPPLY — 21 items
BAG URINE DRAINAGE (UROLOGICAL SUPPLIES) IMPLANT
BAG URO CATCHER STRL LF (DRAPE) ×3 IMPLANT
CATH AINSWORTH 30CC 24FR (CATHETERS) IMPLANT
CATH FOLEY 3WAY 30CC 24FR (CATHETERS)
CATH HEMA 3WAY 30CC 22FR COUDE (CATHETERS) ×2 IMPLANT
CATH URO 16X24FR 3W FL PS (CATHETERS) IMPLANT
CLOTH BEACON ORANGE TIMEOUT ST (SAFETY) ×3 IMPLANT
DRAPE CAMERA CLOSED 9X96 (DRAPES) ×1 IMPLANT
GLOVE BIOGEL M STRL SZ7.5 (GLOVE) ×3 IMPLANT
GOWN STRL REUS W/TWL LRG LVL3 (GOWN DISPOSABLE) ×3 IMPLANT
GOWN STRL REUS W/TWL XL LVL3 (GOWN DISPOSABLE) ×3 IMPLANT
HOLDER FOLEY CATH W/STRAP (MISCELLANEOUS) ×2 IMPLANT
KIT ASPIRATION TUBING (SET/KITS/TRAYS/PACK) ×1 IMPLANT
LOOP CUT BIPOLAR 24F LRG (ELECTROSURGICAL) ×6 IMPLANT
MANIFOLD NEPTUNE II (INSTRUMENTS) ×3 IMPLANT
NS IRRIG 1000ML POUR BTL (IV SOLUTION) ×3 IMPLANT
PACK CYSTO (CUSTOM PROCEDURE TRAY) ×3 IMPLANT
SYR 30ML LL (SYRINGE) IMPLANT
SYRINGE IRR TOOMEY STRL 70CC (SYRINGE) ×3 IMPLANT
TUBING CONNECTING 10 (TUBING) ×2 IMPLANT
TUBING CONNECTING 10' (TUBING) ×1

## 2014-10-13 NOTE — Anesthesia Preprocedure Evaluation (Addendum)
Anesthesia Evaluation  Patient identified by MRN, date of birth, ID band Patient awake    Reviewed: Allergy & Precautions, NPO status , Patient's Chart, lab work & pertinent test results  Airway Mallampati: II  TM Distance: >3 FB Neck ROM: Full    Dental no notable dental hx.    Pulmonary sleep apnea , former smoker,  breath sounds clear to auscultation  Pulmonary exam normal       Cardiovascular hypertension, Pt. on medications + CAD + dysrhythmias + pacemaker Rhythm:Regular Rate:Normal     Neuro/Psych negative neurological ROS  negative psych ROS   GI/Hepatic negative GI ROS, Neg liver ROS,   Endo/Other  diabetes, Type 2  Renal/GU negative Renal ROS  negative genitourinary   Musculoskeletal negative musculoskeletal ROS (+)   Abdominal   Peds negative pediatric ROS (+)  Hematology negative hematology ROS (+)   Anesthesia Other Findings   Reproductive/Obstetrics negative OB ROS                            Anesthesia Physical Anesthesia Plan  ASA: III  Anesthesia Plan: General   Post-op Pain Management:    Induction: Intravenous  Airway Management Planned: LMA  Additional Equipment:   Intra-op Plan:   Post-operative Plan: Extubation in OR  Informed Consent: I have reviewed the patients History and Physical, chart, labs and discussed the procedure including the risks, benefits and alternatives for the proposed anesthesia with the patient or authorized representative who has indicated his/her understanding and acceptance.   Dental advisory given  Plan Discussed with: CRNA  Anesthesia Plan Comments:        Anesthesia Quick Evaluation

## 2014-10-13 NOTE — Interval H&P Note (Signed)
History and Physical Interval Note:  10/13/2014 9:45 AM  Malik Rojas  has presented today for surgery, with the diagnosis of BENIGN PROSTATIC HYPERPLASIA  The various methods of treatment have been discussed with the patient and family. After consideration of risks, benefits and other options for treatment, the patient has consented to  Procedure(s): [place] (TURP) (N/A) as a surgical intervention .  The patient's history has been reviewed, patient examined, no change in status, stable for surgery.  I have reviewed the patient's chart and labs.  Questions were answered to the patient's satisfaction.     Savan Ruta I Anika Shore

## 2014-10-13 NOTE — H&P (Signed)
Reason For Visit Cystoscopy, flowrate & PVR   Active Problems Problems  1. Benign prostatic hyperplasia with urinary obstruction (N40.1)   Assessed By: Carolan Clines (Urology); Last Assessed: 21 Sep 2014 2. Increased urinary frequency (R35.0) 3. Nocturia (R35.1) 4. Urge incontinence of urine (N39.41)   Assessed By: Carolan Clines (Urology); Last Assessed: 21 Sep 2014  History of Present Illness     81 yo married, diabetic male (patient of Dr. Mikle Bosworth), returns today for a cystoscopy, flowrate & PVR. He has been on double dose Tamsulosin by [name] since Oct 2014. He has failed Rapaflo in the past. He is s/p Targis microwave therapy on 03/14/14.       He has a complicated voiding dysfunction and Dr. Matilde Sprang thought that he was not a good candidate for a TURP. His urge incontinence and frequency are on Flomax and Toviaz. He never took the Enablex and he failed Vesicare and Myrbetriq. Current flow rate is peak=6cc/sec.      Urodynamics on 09/25/12, he did not void and was catheterized for 50 mL. Maximum capacity was 175 mL. He had high-pressure instability, associated with severe leakage. He did not leak with a Valsalva pressure of 87-100 cm of water. During voluntary voiding, he voided 175 mL with a maximum flow of 12 mL/sec. Maximum voiding pressure was 51 cm of water. He emptied efficiently. He had involuntary contraction with voiding. EMG activity was mildly increased during the instability. Bladder was trabeculated. He did not have a Christmas tree shaped bladder, however. His bladder was very hypersensitive with very little reaction time.       Medically, pt has a pacemaker for diastolic heart failure, sleep apnea (refuses to wear appliance), and Type II Diabetes Melitis.   Past Medical History Problems  1. History of Cellulitis 2. History of cardiac disorder (Z86.79) 3. History of diabetes mellitus (Z86.39) 4. History of hypertension (Z86.79) 5. History  of sleep apnea (Z87.09)  Surgical History Problems  1. History of Appendectomy 2. History of Continuous Positive Airway Pressure Ventilation 3. History of Pacemaker Placement  Current Meds 1. Aspirin 81 MG Oral Tablet;  Therapy: (Recorded:10Jan2014) to Recorded 2. Furosemide 80 MG Oral Tablet;  Therapy: (Recorded:10Jan2014) to Recorded 3. Glimepiride 1 MG Oral Tablet;  Therapy: (Recorded:10May2011) to Recorded 4. Janumet 50-1000 MG Oral Tablet;  Therapy: (Recorded:10May2011) to Recorded 5. Labetalol HCl - 200 MG Oral Tablet; TAKE 2 TABLET Twice daily;  Therapy: (Recorded:10Aug2015) to Recorded 6. Lisinopril 10 MG Oral Tablet;  Therapy: (Recorded:10Aug2015) to Recorded 7. Omega 3 1000 MG Oral Capsule;  Therapy: (Recorded:10Jan2014) to Recorded 8. Potassium Chloride Crys ER 20 MEQ Oral Tablet Extended Release;  Therapy: (Recorded:10Aug2015) to Recorded 9. Pravachol 80 MG Oral Tablet;  Therapy: (Recorded:10Jan2014) to Recorded 10. Tamsulosin HCl - 0.4 MG Oral Capsule; Take 1 capsule twice daily;   Therapy: [id number] to (Last Rx:08Feb2016)  Requested for: [id number] Ordered  Allergies Medication  1. No Known Drug Allergies  Family History Problems  1. Family history of Death In The Family Father   age 81 2. Family history of Death In The Family Mother   age 40 of emphysema 48. Family history of Family Health Status Number Of Children   2 daughters,  1 son               1     son deceased age 79 of lung cancer 4. Family history of Obesity : Mother  Social History Problems  1. Alcohol Use   very rarely 2. Former  smoker (Z87.891) 3. Marital History - Currently Married 4. History of Tobacco Use   smoked 5 ppd x 20 years.  Quit 35 years ago  Review of Systems Genitourinary, constitutional, skin, eye, otolaryngeal, hematologic/lymphatic, cardiovascular, pulmonary, endocrine, musculoskeletal, gastrointestinal, neurological and psychiatric system(s) were reviewed and  pertinent findings if present are noted and are otherwise negative.  Genitourinary: urinary frequency, feelings of urinary urgency, nocturia, incontinence, weak urinary stream, urinary stream starts and stops and incomplete emptying of bladder.    Vitals Vital Signs [Data Includes: Last 1 Day]  Recorded: [id number] 02:46PM  Blood Pressure: 160 / 75 Temperature: 97.5 F Heart Rate: 87  Physical Exam Constitutional: Well nourished and well developed . No acute distress. The patient appears well hydrated.  ENT:. Examination of the teeth show poor dentition. Hearing loss is noted.  Neck: The appearance of the neck is normal and no neck mass is present.  Pulmonary: No respiratory distress and normal respiratory rhythm and effort . Breathing requires use of accessory muscles.  Cardiovascular: Heart rate and rhythm are normal . No peripheral edema. pacemaker.  Abdomen: The abdomen is obese, but not distended. The abdomen is soft and nontender. No masses are palpated. No CVA tenderness. Bowel sounds are normal. No hernias are palpable. No hepatosplenomegaly noted.  Genitourinary: Examination of the penis demonstrates no discharge, no masses, no lesions and a normal meatus. The scrotum is without lesions. The right epididymis is palpably normal and non-tender. The left epididymis is palpably normal and non-tender. The right testis is non-tender and without masses. The left testis is non-tender and without masses.  Lymphatics: The femoral and inguinal nodes are not enlarged or tender.  Skin: Normal skin turgor, no visible rash and no visible skin lesions.  Neuro/Psych:. Mood and affect are appropriate.    Results/Data  Flow Rate: Voided 227 ml. A peak flow rate of 32ml/s and mean flow rate of 34ml/s.  PVR: Ultrasound PVR 0 ml.    Procedure  Procedure: Cystoscopy  Chaperone Present: kim lewis.  Indication: Lower Urinary Tract Symptoms.  Informed Consent: Risks, benefits, and potential adverse  events were discussed and informed consent was obtained from the patient.  Prep: The patient was prepped with betadine.  Anesthesia:. Local anesthesia was administered intraurethrally with 2% lidocaine jelly.  Antibiotic prophylaxis: Ciprofloxacin.  Procedure Note:  Urethral meatus:. No abnormalities.  Anterior urethra: No abnormalities.  Prostatic urethra:. There was visual obstruction of the prostatic urethra. Bilobar necrotic debris.  Bladder: Visulization was clear. The ureteral orifices were in the normal anatomic position bilaterally. Examination of the bladder demonstrated trabeculation, but no clot within the bladder and no diverticulum cellules, but no erythematous mucosa.  Complications: None.    Assessment Assessed  1. Urge incontinence of urine (N39.41) 2. Benign prostatic hyperplasia with urinary obstruction (N40.1)  81 yo male post microwave thermotherapy, but with voiding symptoms. He is post cysto, showing bilobar necrotic webbing causing obstruction.   Plan Schedule TURP.   Signatures Electronically signed by : Carolan Clines, M.D.; Sep 21 2014  3:51PM EST

## 2014-10-13 NOTE — Discharge Instructions (Signed)
Post transurethral resection of the prostate (TURP) instructions ° °Your recent prostate surgery requires very special post hospital care. Despite the fact that no skin incisions were used the area around the prostate incision is quite raw and is covered with a scab to promote healing and prevent bleeding. Certain cautions are needed to assure that the scab is not disturbed of the next 2-3 weeks while the healing proceeds. ° °Because the raw surface in your prostate and the irritating effects of urine you may expect frequency of urination and/or urgency (a stronger desire to urinate) and perhaps even getting up at night more often. This will usually resolve or improve slowly over the healing period. You may see some blood in your urine over the first 6 weeks. Do not be alarmed, even if the urine was clear for a while. Get off your feet and drink lots of fluids until clearing occurs. If you start to pass clots or don't improve call us. ° °Diet: ° °You may return to your normal diet immediately. Because of the raw surface of your bladder, alcohol, spicy foods, foods high in acid and drinks with caffeine may cause irritation or frequency and should be used in moderation. To keep your urine flowing freely and avoid constipation, drink plenty of fluids during the day (8-10 glasses). Tip: Avoid cranberry juice because it is very acidic. ° °Activity: ° °Your physical activity doesn't need to be restricted. However, if you are very active, you may see some blood in the urine. We suggest that you reduce your activity under the circumstances until the bleeding has stopped. ° °Bowels: ° °It is important to keep your bowels regular during the postoperative period. Straining with bowel movements can cause bleeding. A bowel movement every other day is reasonable. Use a mild laxative if needed, such as milk of magnesia 2-3 tablespoons, or 2 Dulcolax tablets. Call if you continue to have problems. If you had been taking narcotics  for pain, before, during or after your surgery, you may be constipated. Take a laxative if necessary. ° °Medication: ° °You should resume your pre-surgery medications unless told not to. In addition you may be given an antibiotic to prevent or treat infection. Antibiotics are not always necessary. All medication should be taken as prescribed until the bottles are finished unless you are having an unusual reaction to one of the drugs. ° ° ° ° °Problems you should report to us: ° °a. Fever greater than 101°F. °b. Heavy bleeding, or clots (see notes above about blood in urine). °c. Inability to urinate. °d. Drug reactions (hives, rash, nausea, vomiting, diarrhea). °e. Severe burning or pain with urination that is not improving. ° °

## 2014-10-13 NOTE — Op Note (Signed)
Pre-operative diagnosis :   BPH  Postoperative diagnosis:  Same  Operation:  Cystourethroscopy, TURP  Surgeon:  S. Gaynelle Arabian, MD  First assistant:  None  Anesthesia:  General LMA  Preparation: After appropriate preanesthesia, the patient was brought the operating room, placed on the operating table in the dorsal supine position where general LMA anesthesia was introduced. He was then replaced in the dorsal lithotomy position where the pubis was prepped with Betadine solution and draped in usual fashion.  Review history:  Active Problems Problems  1. Benign prostatic hyperplasia with urinary obstruction (N40.1)  Assessed By: Carolan Clines (Urology); Last Assessed: 21 Sep 2014 2. Increased urinary frequency (R35.0) 3. Nocturia (R35.1) 4. Urge incontinence of urine (N39.41)  Assessed By: Carolan Clines (Urology); Last Assessed: 21 Sep 2014  History of Present Illness    81 yo married, diabetic male (patient of Dr. Mikle Bosworth), returns today for a cystoscopy, flowrate & PVR. He has been on double dose Tamsulosin by [name] since Oct 2014. He has failed Rapaflo in the past. He is s/p Targis microwave therapy on 03/14/14.     He has a complicated voiding dysfunction and Dr. Matilde Sprang thought that he was not a good candidate for a TURP. His urge incontinence and frequency are on Flomax and Toviaz. He never took the Enablex and he failed Vesicare and Myrbetriq. Current flow rate is peak=6cc/sec.      Statement of  Likelihood of Success: Excellent. TIME-OUT observed.:  Procedure:  The urethra was dilated to a size 73 [place] with the male sounds. The continuous flow resectoscope sheath was then placed, and direct vision cystoscopy revealed a lobar BPH with very large lateral lobes. The vera was maintained in view for the entire procedure. The patient had a large amount of prostatic stones identified just proximal to the 0. The bladder neck was only slightly  elevated. Trabeculation was present within the bladder, but the ureteral orifices were normal position, with clear reflux from both orifices. Trabeculation was present but there were no cellule formation. No diverticular formation was identified. There were no bladder stones or tumors identified.  Resection was accomplished from the 11:00 to the 7:00 position, and from the 1:00 to the 5:00 positions. Resection was also accomplished from the 7 to the 5:00 position, with particular attention to the multiple prostatic stones proximal to the 0. Following resection, chips were evacuated free from the bladder with the piston syringe. Repeat cystoscopy was at risk, and any bleeding was electrocoagulated. A size 22 three-way Foley cath was placed to traction and continuous irrigation. The patient received a BNO suppository. He was awakened and taken to recovery room in good condition.

## 2014-10-13 NOTE — Transfer of Care (Signed)
Immediate Anesthesia Transfer of Care Note  Patient: Malik Rojas  Procedure(s) Performed: Procedure(s): WOLF TRANSURETHRAL RESECTION OF THE PROSTATE (TURP) (N/A)  Patient Location: PACU  Anesthesia Type:General  Level of Consciousness: awake  Airway & Oxygen Therapy: Patient Spontanous Breathing and Patient connected to face mask oxygen  Post-op Assessment: Report given to RN and Post -op Vital signs reviewed and stable  Post vital signs: Reviewed and stable  Last Vitals:  Filed Vitals:   10/13/14 0710  BP: 197/78  Pulse: 72  Temp: 36.7 C  Resp: 16    Complications: No apparent anesthesia complications

## 2014-10-13 NOTE — Anesthesia Postprocedure Evaluation (Signed)
  Anesthesia Post-op Note  Patient: Malik Rojas  Procedure(s) Performed: Procedure(s) (LRB): WOLF TRANSURETHRAL RESECTION OF THE PROSTATE (TURP) (N/A)  Patient Location: PACU  Anesthesia Type: General  Level of Consciousness: awake and alert   Airway and Oxygen Therapy: Patient Spontanous Breathing  Post-op Pain: mild  Post-op Assessment: Post-op Vital signs reviewed, Patient's Cardiovascular Status Stable, Respiratory Function Stable, Patent Airway and No signs of Nausea or vomiting  Last Vitals:  Filed Vitals:   10/13/14 1421  BP: 190/67  Pulse: 53  Temp: 36.9 C  Resp: 16    Post-op Vital Signs: stable   Complications: No apparent anesthesia complications

## 2014-10-13 NOTE — Interval H&P Note (Signed)
History and Physical Interval Note:  10/13/2014 9:44 AM  Malik Rojas  has presented today for surgery, with the diagnosis of BENIGN PROSTATIC HYPERPLASIA  The various methods of treatment have been discussed with the patient and family. After consideration of risks, benefits and other options for treatment, the patient has consented to  Procedure(s): [place] (TURP) (N/A) as a surgical intervention .  The patient's history has been reviewed, patient examined, no change in status, stable for surgery.  I have reviewed the patient's chart and labs.  Questions were answered to the patient's satisfaction.     Kailany Dinunzio I Agapita Savarino

## 2014-10-14 ENCOUNTER — Encounter (HOSPITAL_COMMUNITY): Payer: Self-pay | Admitting: Urology

## 2014-10-14 DIAGNOSIS — N401 Enlarged prostate with lower urinary tract symptoms: Secondary | ICD-10-CM | POA: Diagnosis not present

## 2014-10-14 LAB — GLUCOSE, CAPILLARY: GLUCOSE-CAPILLARY: 121 mg/dL — AB (ref 70–99)

## 2014-10-14 MED ORDER — MUPIROCIN 2 % EX OINT
TOPICAL_OINTMENT | CUTANEOUS | Status: AC
  Filled 2014-10-14: qty 22

## 2014-10-14 MED ORDER — TRIMETHOPRIM 100 MG PO TABS: 100.0000 mg | ORAL_TABLET | ORAL | Status: DC

## 2014-10-14 MED ORDER — TRAMADOL-ACETAMINOPHEN 37.5-325 MG PO TABS: 1.0000 | ORAL_TABLET | Freq: Four times a day (QID) | ORAL | Status: DC | PRN

## 2014-10-14 NOTE — Discharge Summary (Signed)
Physician Discharge Summary  Patient ID: DAMERE BRANDENBURG MRN: [id number] DOB/AGE: September 24, 1931 81 y.o.  Admit date: 10/13/2014 Discharge date: 10/14/2014  Admission Diagnoses: BENIGN PROSTATIC HYPERPLASIA  Discharge Diagnoses:  Active Problems:   Benign prostatic hypertrophy (BPH) with weak urinary stream   Discharged Condition: stable}  Hospital Course: TURP  Significant Diagnostic Studies: No results found.  Discharge Exam: Blood pressure 188/67, pulse 73, temperature 98.2 F (36.8 C), temperature source Oral, resp. rate 16, height 5\' 8"  (1.727 m), weight 102.059 kg (225 lb), SpO2 94 %.   Disposition: 01-Home or Self Care  Discharge Instructions    Continue foley catheter    Complete by:  As directed      Continue foley catheter    Complete by:  As directed      Discharge patient    Complete by:  As directed      Discontinue IV    Complete by:  As directed             Medication List    STOP taking these medications        aspirin 81 MG tablet      TAKE these medications        BIOTENE MOISTURIZING MOUTH Soln  Use as directed 2 sprays in the mouth or throat 2 (two) times daily.     ciprofloxacin 500 MG tablet  Commonly known as:  CIPRO  Take 1 tablet (500 mg total) by mouth 2 (two) times daily.     Fish Oil 1000 MG Caps  Take 1,000 mg by mouth daily.     furosemide 80 MG tablet  Commonly known as:  LASIX  Take 0.5 tablets (40 mg total) by mouth daily.     glucose blood test strip  1 each by Other route 2 (two) times daily. Use as instructed THIS IS FOR THE ACCUCHECK DX 250.00     labetalol 200 MG tablet  Commonly known as:  NORMODYNE  Take 2 tablets (400 mg total) by mouth 2 (two) times daily.     labetalol 300 MG tablet  Commonly known as:  NORMODYNE  TAKE 1 TABLET BY MOUTH TWICE DAILY     Lancets Misc  1 each by Does not apply route 2 (two) times daily. (THIS IS FOR ACCUCHECK)     lisinopril 10 MG tablet  Commonly known as:   PRINIVIL,ZESTRIL  Take 1 tablet (10 mg total) by mouth daily.     phenazopyridine 200 MG tablet  Commonly known as:  PYRIDIUM  Take 1 tablet (200 mg total) by mouth 3 (three) times daily as needed for pain.     potassium chloride SA 20 MEQ tablet  Commonly known as:  K-DUR,KLOR-CON  Take 0.5 tablets (10 mEq total) by mouth daily.     pravastatin 80 MG tablet  Commonly known as:  PRAVACHOL  TAKE 1 TABLET BY MOUTH EVERY DAY     sitaGLIPtin-metformin 50-1000 MG per tablet  Commonly known as:  JANUMET  TAKE 1 TABLET BY MOUTH TWICE DAILY WITH A MEAL     tamsulosin 0.4 MG Caps capsule  Commonly known as:  FLOMAX  TAKE ONE CAPSULE BY MOUTH TWICE DAILY     traMADol-acetaminophen 37.5-325 MG per tablet  Commonly known as:  ULTRACET  Take 1 tablet by mouth every 6 (six) hours as needed.     traMADol-acetaminophen 37.5-325 MG per tablet  Commonly known as:  ULTRACET  Take 1 tablet by mouth every 6 (six) hours as  needed.     trimethoprim 100 MG tablet  Commonly known as:  TRIMPEX  Take 1 tablet (100 mg total) by mouth 1 day or 1 dose.           Follow-up Information    Follow up with Ailene Rud, MD.   Specialty:  Urology   Why:  for foley removal   Contact information:   [place] [address] [phone]       Follow up with Ailene Rud, MD.   Specialty:  Urology   Why:  per appointment   Contact information:   [place] [address] [phone]     23 hr observation. Urine dark, but no clots. Feels well. Eating . + flatus. No fever.  Pt is discharged with foley in place. Will RTC Monday for catheter removal.   Signed: Coben Godshall I Juliano Mceachin 10/14/2014, 8:18 AM

## 2014-10-14 NOTE — Progress Notes (Signed)
Urology Progress Note  1 Day Post-Op   Turp: urine dark, but no clots.  Subjective:     No acute urologic events overnight. Ambulation:   positive Flatus:    positive Bowel movement  negative  Pain: complete resolution  Objective:  Blood pressure 188/67, pulse 73, temperature 98.2 F (36.8 C), temperature source Oral, resp. rate 16, height 5\' 8"  (1.727 m), weight 102.059 kg (225 lb), SpO2 94 %.  Physical Exam:  General:  No acute distress, awake Resp: clear to auscultation bilaterally Genitourinary:  Normal suprapubic exam.  Foley: remains for weekend.     I/O last 3 completed shifts: In: 13471.7 [P.O.:660; I.V.:1711.7; Other:11100] Out: 3825 [Urine:3825]  No results for input(s): HGB, WBC, PLT in the last 72 hours.  No results for input(s): NA, K, CL, CO2, BUN, CREATININE, CALCIUM, GFRNONAA, GFRAA in the last 72 hours.  Invalid input(s): MAGNESIUM   No results for input(s): INR, APTT in the last 72 hours.  Invalid input(s): PT   Invalid input(s): ABG  Assessment/Plan:  Catheter not removed. D/c today. RTC Monday for catheter removal.

## 2014-10-14 NOTE — Progress Notes (Signed)
UR completed 

## 2014-10-29 ENCOUNTER — Other Ambulatory Visit: Payer: Self-pay | Admitting: Family Medicine

## 2014-11-03 ENCOUNTER — Other Ambulatory Visit: Payer: Self-pay | Admitting: Medical

## 2014-11-21 ENCOUNTER — Ambulatory Visit (INDEPENDENT_AMBULATORY_CARE_PROVIDER_SITE_OTHER): Payer: Medicare Other | Admitting: Family Medicine

## 2014-11-21 ENCOUNTER — Encounter: Payer: Self-pay | Admitting: Family Medicine

## 2014-11-21 VITALS — BP 124/80 | HR 76 | Wt 226.0 lb

## 2014-11-21 DIAGNOSIS — R3912 Poor urinary stream: Secondary | ICD-10-CM

## 2014-11-21 DIAGNOSIS — E1159 Type 2 diabetes mellitus with other circulatory complications: Secondary | ICD-10-CM

## 2014-11-21 DIAGNOSIS — N401 Enlarged prostate with lower urinary tract symptoms: Secondary | ICD-10-CM | POA: Diagnosis not present

## 2014-11-21 DIAGNOSIS — E1169 Type 2 diabetes mellitus with other specified complication: Secondary | ICD-10-CM | POA: Diagnosis not present

## 2014-11-21 DIAGNOSIS — E785 Hyperlipidemia, unspecified: Secondary | ICD-10-CM | POA: Diagnosis not present

## 2014-11-21 DIAGNOSIS — N183 Chronic kidney disease, stage 3 unspecified: Secondary | ICD-10-CM

## 2014-11-21 DIAGNOSIS — I10 Essential (primary) hypertension: Secondary | ICD-10-CM | POA: Diagnosis not present

## 2014-11-21 DIAGNOSIS — E0821 Diabetes mellitus due to underlying condition with diabetic nephropathy: Secondary | ICD-10-CM | POA: Diagnosis not present

## 2014-11-21 DIAGNOSIS — E669 Obesity, unspecified: Secondary | ICD-10-CM

## 2014-11-21 LAB — POCT GLYCOSYLATED HEMOGLOBIN (HGB A1C): HEMOGLOBIN A1C: 6.5

## 2014-11-21 NOTE — Progress Notes (Signed)
  Subjective:    Patient ID: Malik Rojas, male    DOB: [birth year], 81 y.o.   MRN: [id number]  Malik Rojas is a 81 y.o. male who presents for follow-up of Type 2 diabetes mellitus.he also has underlying OSA and states that he is not using his CPAP. He is now sleeping in a chair and his wife states he is having no more difficulty with snoring or apnea episodes. He wakes up refreshed and does not follow sleep during the day. He is also being treated and evaluated by urology. He apparently recently had a TURP but still having difficulty with that. He has an appointment for follow-up in the near future.  Home blood sugar records: Patient test one time a day Current symptoms/problems  none Daily foot checks:   Any foot concerns:none Exercise: is minimal Eyes; 6 weeks ago The following portions of the patient's history were reviewed and updated as appropriate: allergies, current medications, past medical history, past social history and problem list.  ROS as in subjective above.     Objective:    Physical Exam Alert and in no distress otherwise not examined.  Lab Review Diabetic Labs Latest Ref Rng 10/10/2014 07/22/2014 03/21/2014 05/25/2013 02/01/2013  HbA1c - - 6.2 6.0 5.9 -  Chol 0 - 200 mg/dL - - 119 120 -  HDL >39 mg/dL - - 38(L) 37(L) -  Calc LDL 0 - 99 mg/dL - - 47 41 -  Triglycerides <150 mg/dL - - 172(H) 209(H) -  Creatinine 0.50 - 1.35 mg/dL 1.29 - 1.27 - 1.6(H)  GFR >60.00 mL/min - - - - 44.56(L)   BP/Weight 10/14/2014 10/13/2014 10/10/2014 07/22/2014 11/01/5186  Systolic BP 416 - 606 301 601  Diastolic BP 67 - 81 88 90  Wt. (Lbs) - 225 225 222 227  BMI - 34.22 34.22 31.85 32.57   Malik Rojas  reports that he quit smoking about 44 years ago. He quit smokeless tobacco use about 50 years ago. He reports that he drinks alcohol. He reports that he does not use illicit drugs. Hemoglobin A1c is 6.5    Assessment & Plan:    Obesity (BMI 30-39.9)  Hypertension associated with  diabetes  Hyperlipidemia associated with type 2 diabetes mellitus  Diabetes mellitus due to underlying condition with diabetic nephropathy  Chronic kidney disease, stage III (moderate)  Benign prostatic hypertrophy (BPH) with weak urinary stream   1. Rx changes: none 2. Education: Reviewed 'ABCs' of diabetes management (respective goals in parentheses):  A1C (<7), blood pressure (<130/80), and cholesterol (LDL <100). 3. Compliance at present is estimated to be good. Efforts to improve compliance (if necessary) will be directed at increased exercise. 4. Follow up: 4 months Overall he seems to be maintaining. I see no reason to make any major changes his present regimen.

## 2014-12-01 ENCOUNTER — Other Ambulatory Visit: Payer: Self-pay | Admitting: Family Medicine

## 2014-12-07 ENCOUNTER — Other Ambulatory Visit: Payer: Self-pay | Admitting: Medical

## 2014-12-14 ENCOUNTER — Ambulatory Visit (INDEPENDENT_AMBULATORY_CARE_PROVIDER_SITE_OTHER): Payer: Medicare Other | Admitting: *Deleted

## 2014-12-14 ENCOUNTER — Encounter: Payer: Self-pay | Admitting: Internal Medicine

## 2014-12-14 ENCOUNTER — Telehealth: Payer: Self-pay | Admitting: Cardiology

## 2014-12-14 DIAGNOSIS — I442 Atrioventricular block, complete: Secondary | ICD-10-CM

## 2014-12-14 NOTE — Telephone Encounter (Signed)
Attempted to confirm remote transmission with pt. No answer and was unable to leave a message.   

## 2014-12-15 NOTE — Progress Notes (Signed)
Remote pacemaker transmission.   

## 2014-12-16 LAB — CUP PACEART REMOTE DEVICE CHECK
Battery Impedance: 158 Ohm
Battery Voltage: 2.79 V
Brady Statistic AP VP Percent: 68 %
Brady Statistic AP VS Percent: 0 %
Brady Statistic AS VP Percent: 32 %
Brady Statistic AS VS Percent: 0 %
Date Time Interrogation Session: 20160511185202
Lead Channel Impedance Value: 578 Ohm
Lead Channel Impedance Value: 760 Ohm
Lead Channel Pacing Threshold Amplitude: 0.625 V
Lead Channel Pacing Threshold Pulse Width: 0.4 ms
Lead Channel Setting Pacing Pulse Width: 0.4 ms
MDC IDC MSMT BATTERY REMAINING LONGEVITY: 110 mo
MDC IDC MSMT LEADCHNL RA PACING THRESHOLD AMPLITUDE: HIGH
MDC IDC MSMT LEADCHNL RA SENSING INTR AMPL: 2.8 mV
MDC IDC SET LEADCHNL RA PACING AMPLITUDE: 2.5 V
MDC IDC SET LEADCHNL RV PACING AMPLITUDE: 2.5 V
MDC IDC SET LEADCHNL RV SENSING SENSITIVITY: 2.8 mV

## 2014-12-26 ENCOUNTER — Encounter: Payer: Self-pay | Admitting: Cardiology

## 2014-12-28 ENCOUNTER — Telehealth: Payer: Self-pay | Admitting: Family Medicine

## 2014-12-28 NOTE — Telephone Encounter (Signed)
Pt. Has a patch on his rt. Leg and wife thinks it may be edema or cellulitis that is oozing out.  Pt. Has tried white sab cream . Pt. Has a appt. Tomorrow morning but would could she do until then.

## 2014-12-28 NOTE — Telephone Encounter (Signed)
Use warm compresses, leg elevation.  If fever or feeling sick, may need to be worked in today or go to urgent care if really bad.  Hard to say over the phone.

## 2014-12-29 ENCOUNTER — Ambulatory Visit (INDEPENDENT_AMBULATORY_CARE_PROVIDER_SITE_OTHER): Payer: Medicare Other | Admitting: Medical

## 2014-12-29 ENCOUNTER — Encounter: Payer: Self-pay | Admitting: Medical

## 2014-12-29 VITALS — BP 150/86 | HR 72 | Temp 98.1°F | Resp 16 | Wt 225.0 lb

## 2014-12-29 DIAGNOSIS — I83015 Varicose veins of right lower extremity with ulcer other part of foot: Secondary | ICD-10-CM

## 2014-12-29 DIAGNOSIS — I83014 Varicose veins of right lower extremity with ulcer of heel and midfoot: Secondary | ICD-10-CM | POA: Diagnosis not present

## 2014-12-29 DIAGNOSIS — I83018 Varicose veins of right lower extremity with ulcer other part of lower leg: Secondary | ICD-10-CM

## 2014-12-29 DIAGNOSIS — I83019 Varicose veins of right lower extremity with ulcer of unspecified site: Secondary | ICD-10-CM | POA: Diagnosis not present

## 2014-12-29 DIAGNOSIS — I83012 Varicose veins of right lower extremity with ulcer of calf: Secondary | ICD-10-CM | POA: Diagnosis not present

## 2014-12-29 DIAGNOSIS — I83013 Varicose veins of right lower extremity with ulcer of ankle: Secondary | ICD-10-CM | POA: Diagnosis not present

## 2014-12-29 DIAGNOSIS — IMO0001 Reserved for inherently not codable concepts without codable children: Secondary | ICD-10-CM

## 2014-12-29 DIAGNOSIS — I83011 Varicose veins of right lower extremity with ulcer of thigh: Secondary | ICD-10-CM

## 2014-12-29 DIAGNOSIS — E119 Type 2 diabetes mellitus without complications: Secondary | ICD-10-CM | POA: Diagnosis not present

## 2014-12-29 NOTE — Progress Notes (Signed)
Subjective: Here with wife for ulcer of leg.  Has known poor circulation in legs, has gotten small ulcers/wounds from time to time, and has seen wound clinic prior.   2 days ago had small bump that opened up and now has larger wound of right lower leg.  Right leg is chronically more swollen than left.   They wanted this checked out before the weekend.  No fever, not feeling sick, no nausea or vomiting.  He does wear compression hose and elevates legs every evening. On lasix 80mg  daily.  No other aggravating or relieving factors. No other complaint.  Past Medical History  Diagnosis Date  . Complete heart block     Status post Medtronic pacemaker  . HTN (hypertension)   . Edema   . Obesity   . Chronic combined systolic and diastolic heart failure     Ejection fraction 55% with elevated end-diastolic pressure-catheterization 3/14  . Hx of cardiovascular stress test     a. [place] MV 3/14:  Low risk, EF 36%, apical inf scar, no ischemia, inf-apical HK>> catheterization however demonstrated normal left ventricular function  . Cardiac pacemaker Medtronic   . CAD (coronary artery disease)     LHC 3/14: Distal left main 10%, LAD 20%, circumflex 10-20%, proximal RCA 20%, distal 40-50%, EF 50-55%.  . Presence of permanent cardiac pacemaker   . Pneumonia     hx of at age 10  . Melanoma   . Diabetes mellitus     Type 2   ROS as in subjective  Objective: BP 150/86 mmHg  Pulse 72  Temp(Src) 98.1 F (36.7 C) (Oral)  Resp 16  Wt 225 lb (102.059 kg)  Gen: wd, wn, nad bilat lower legs with red/brown brawny coloration from just below knee to ankles.  bilat LE edema, R>L, chronic per patient.  No erythema, no warmth.   sensation somewhat decreased in general bilat LE 1+ bilat LE pulses There is a 6cm x 5cm triangular slightly depressed ulceration with some serous seepage of right lower leg mid shaft anteriorly   Assessment: Encounter Diagnoses  Name Primary?  . Venous stasis ulcer, right Yes  .  Diabetes type 2, controlled     Plan: discussed findings, possible treatment options.  C/t current medications, c/t leg elevation, but do more leg elevation over the weekend.  In general c/t compression hoes daily.    I had him go to pharmacy and pickup up [employer].  He came back and I applied Unna Boot wrap to right lower leg.  F/u in 5-7 days, sooner prn.

## 2014-12-29 NOTE — Telephone Encounter (Signed)
Pt was in this morning to discuss this. Not need to call pt about this

## 2015-01-05 ENCOUNTER — Encounter: Payer: Self-pay | Admitting: Medical

## 2015-01-05 ENCOUNTER — Ambulatory Visit (INDEPENDENT_AMBULATORY_CARE_PROVIDER_SITE_OTHER): Payer: Medicare Other | Admitting: Medical

## 2015-01-05 DIAGNOSIS — I83018 Varicose veins of right lower extremity with ulcer other part of lower leg: Secondary | ICD-10-CM | POA: Diagnosis not present

## 2015-01-05 DIAGNOSIS — I83014 Varicose veins of right lower extremity with ulcer of heel and midfoot: Secondary | ICD-10-CM

## 2015-01-05 DIAGNOSIS — I83011 Varicose veins of right lower extremity with ulcer of thigh: Secondary | ICD-10-CM

## 2015-01-05 DIAGNOSIS — L539 Erythematous condition, unspecified: Secondary | ICD-10-CM | POA: Diagnosis not present

## 2015-01-05 DIAGNOSIS — I83015 Varicose veins of right lower extremity with ulcer other part of foot: Secondary | ICD-10-CM

## 2015-01-05 DIAGNOSIS — I83013 Varicose veins of right lower extremity with ulcer of ankle: Secondary | ICD-10-CM

## 2015-01-05 DIAGNOSIS — IMO0001 Reserved for inherently not codable concepts without codable children: Secondary | ICD-10-CM

## 2015-01-05 DIAGNOSIS — I83012 Varicose veins of right lower extremity with ulcer of calf: Secondary | ICD-10-CM | POA: Diagnosis not present

## 2015-01-05 DIAGNOSIS — I83019 Varicose veins of right lower extremity with ulcer of unspecified site: Secondary | ICD-10-CM

## 2015-01-05 LAB — CBC WITH DIFFERENTIAL/PLATELET
BASOS PCT: 1 % (ref 0–1)
Basophils Absolute: 0.1 10*3/uL (ref 0.0–0.1)
Eosinophils Absolute: 0.3 10*3/uL (ref 0.0–0.7)
Eosinophils Relative: 3 % (ref 0–5)
HCT: 40.9 % (ref 39.0–52.0)
Hemoglobin: 13.6 g/dL (ref 13.0–17.0)
Lymphocytes Relative: 17 % (ref 12–46)
Lymphs Abs: 1.4 10*3/uL (ref 0.7–4.0)
MCH: 30.8 pg (ref 26.0–34.0)
MCHC: 33.3 g/dL (ref 30.0–36.0)
MCV: 92.7 fL (ref 78.0–100.0)
MPV: 9.4 fL (ref 8.6–12.4)
Monocytes Absolute: 0.8 10*3/uL (ref 0.1–1.0)
Monocytes Relative: 9 % (ref 3–12)
Neutro Abs: 5.9 10*3/uL (ref 1.7–7.7)
Neutrophils Relative %: 70 % (ref 43–77)
Platelets: 206 10*3/uL (ref 150–400)
RBC: 4.41 MIL/uL (ref 4.22–5.81)
RDW: 14.4 % (ref 11.5–15.5)
WBC: 8.4 10*3/uL (ref 4.0–10.5)

## 2015-01-05 LAB — BASIC METABOLIC PANEL
BUN: 19 mg/dL (ref 6–23)
CHLORIDE: 105 meq/L (ref 96–112)
CO2: 26 mEq/L (ref 19–32)
Calcium: 9 mg/dL (ref 8.4–10.5)
Creat: 1.37 mg/dL — ABNORMAL HIGH (ref 0.50–1.35)
Glucose, Bld: 103 mg/dL — ABNORMAL HIGH (ref 70–99)
POTASSIUM: 4.5 meq/L (ref 3.5–5.3)
SODIUM: 142 meq/L (ref 135–145)

## 2015-01-05 MED ORDER — SILVER SULFADIAZINE 1 % EX CREA: 1.0000 "application " | TOPICAL_CREAM | Freq: Every day | CUTANEOUS | Status: DC

## 2015-01-05 MED ORDER — CEPHALEXIN 500 MG PO CAPS: 500.0000 mg | ORAL_CAPSULE | Freq: Three times a day (TID) | ORAL | Status: DC

## 2015-01-05 NOTE — Progress Notes (Signed)
Subjective: Here with wife for ulcer of leg recheck and removal of unna boot.  Since last week, feels about the same, not feeling sick, no fever, glucose running 120s.  Has known poor circulation in legs, has gotten small ulcers/wounds from time to time, and has seen wound clinic prior.   Right leg is chronically more swollen than left.   They wanted this checked out before the weekend.  No fever, not feeling sick, no nausea or vomiting.  He does wear compression hose and elevates legs every evening. On lasix 80mg  daily.  No other aggravating or relieving factors. No other complaint.  Past Medical History  Diagnosis Date  . Complete heart block     Status post Medtronic pacemaker  . HTN (hypertension)   . Edema   . Obesity   . Chronic combined systolic and diastolic heart failure     Ejection fraction 55% with elevated end-diastolic pressure-catheterization 3/14  . Hx of cardiovascular stress test     a. [place] MV 3/14:  Low risk, EF 36%, apical inf scar, no ischemia, inf-apical HK>> catheterization however demonstrated normal left ventricular function  . Cardiac pacemaker Medtronic   . CAD (coronary artery disease)     LHC 3/14: Distal left main 10%, LAD 20%, circumflex 10-20%, proximal RCA 20%, distal 40-50%, EF 50-55%.  . Presence of permanent cardiac pacemaker   . Pneumonia     hx of at age 15  . Melanoma   . Diabetes mellitus     Type 2   ROS as in subjective  Objective: Gen: wd, wn, nad bilat lower legs with red/brown brawny coloration from just below knee to ankles.  bilat LE edema, R>L, chronic per patient.  No erythema, no warmth.   sensation somewhat decreased in general bilat LE 1+ bilat LE pulses There is a 6cm x 5cm triangular slightly depressed ulceration with some serous seepage of right lower leg mid shaft anteriorly   Assessment: Encounter Diagnoses  Name Primary?  . Venous stasis ulcer, right Yes  . Leg erythema     Plan: discussed findings, possible treatment  options.  Didn't respond as desired with [employer], but no worse.  Labs today.   Begin keflex oral, discussed wound care, dressing changes and begin Silvadene cream daily, leg elevation, c/t compression hose, c/t lasix.   F/u 3-5 days.

## 2015-01-05 NOTE — Patient Instructions (Signed)
Encounter Diagnoses  Name Primary?  . Venous stasis ulcer, right Yes  . Leg erythema    Recommendations  Begin Keflex antibiotic 3 times daily for 10 days  We will call with lab results  Clean the wound with soap and water daily  Use Silvadene cream topically to the wound 1-2 times daily, changing out the bandage each time  continue to use the compression hose daily  continue to elevate the leg preferably 2-3 times daily  If fever, worse pain of the leg, then call right away  Recheck 3-5 days

## 2015-01-10 ENCOUNTER — Telehealth: Payer: Self-pay | Admitting: Medical

## 2015-01-10 NOTE — Telephone Encounter (Signed)
See if we can work him in tomorrow.    If no improvement, will get him into wound clinic

## 2015-01-10 NOTE — Telephone Encounter (Signed)
Call and check on his leg

## 2015-01-10 NOTE — Telephone Encounter (Signed)
Patient states his leg is still there. He states its no worse. Not sure but he has an appointment on 01/12/15 with Dorothea Ogle PA

## 2015-01-10 NOTE — Telephone Encounter (Signed)
LMOM TO CB. CLS 

## 2015-01-11 NOTE — Telephone Encounter (Signed)
Patient is coming in tomorrow. 

## 2015-01-12 ENCOUNTER — Ambulatory Visit (INDEPENDENT_AMBULATORY_CARE_PROVIDER_SITE_OTHER): Payer: Medicare Other | Admitting: Medical

## 2015-01-12 ENCOUNTER — Encounter: Payer: Self-pay | Admitting: Medical

## 2015-01-12 VITALS — BP 140/82 | HR 78 | Temp 98.2°F | Resp 16 | Wt 223.0 lb

## 2015-01-12 DIAGNOSIS — I872 Venous insufficiency (chronic) (peripheral): Secondary | ICD-10-CM

## 2015-01-12 DIAGNOSIS — I8311 Varicose veins of right lower extremity with inflammation: Secondary | ICD-10-CM | POA: Diagnosis not present

## 2015-01-12 MED ORDER — SILVER SULFADIAZINE 1 % EX CREA: 1.0000 "application " | TOPICAL_CREAM | Freq: Every day | CUTANEOUS | Status: DC

## 2015-01-12 MED ORDER — CEPHALEXIN 500 MG PO CAPS: 500.0000 mg | ORAL_CAPSULE | Freq: Two times a day (BID) | ORAL | Status: DC

## 2015-01-12 NOTE — Progress Notes (Signed)
Subjective: Here with wife for ulcer of leg recheck.  Since last week, feels improved, been using elevation, Keflex, has 3 more days of Keflex, using silvadene cream.  No additional oozing.  He does wear compression hose and elevates legs every evening. On lasix 80mg  daily.  No other aggravating or relieving factors. No other complaint.  Past Medical History  Diagnosis Date  . Complete heart block     Status post Medtronic pacemaker  . HTN (hypertension)   . Edema   . Obesity   . Chronic combined systolic and diastolic heart failure     Ejection fraction 55% with elevated end-diastolic pressure-catheterization 3/14  . Hx of cardiovascular stress test     a. [place] MV 3/14:  Low risk, EF 36%, apical inf scar, no ischemia, inf-apical HK>> catheterization however demonstrated normal left ventricular function  . Cardiac pacemaker Medtronic   . CAD (coronary artery disease)     LHC 3/14: Distal left main 10%, LAD 20%, circumflex 10-20%, proximal RCA 20%, distal 40-50%, EF 50-55%.  . Presence of permanent cardiac pacemaker   . Pneumonia     hx of at age 81  . Melanoma   . Diabetes mellitus     Type 2   ROS as in subjective  Objective: Gen: wd, wn, nad bilat lower legs with red/brown brawny coloration from just below knee to ankles.  bilat LE edema, R>L, chronic per patient.  No erythema, no warmth.   sensation somewhat decreased in general bilat LE 1+ bilat LE pulses There is a 5.5cm x 4cm triangular slightly depressed wound of right lower leg mid shaft anteriorly.  Currently no serous fluid or crusting or gray tissue. Pink throughout, improved appearing.   Assessment: Encounter Diagnosis  Name Primary?  . Venous stasis dermatitis of right lower extremity Yes    Plan: discussed findings, possible treatment options.  Dr.Lalonde also examined patient.   C/t Keflex BID x 2-3 weeks.   Silvadene until skin appears the same as surrounding tissue.  Call or retur in in 2wk, sooner prn.  Nurse  covered wound with new sterile bandage and silvadene cream

## 2015-01-16 ENCOUNTER — Other Ambulatory Visit: Payer: Self-pay | Admitting: Medical

## 2015-01-20 ENCOUNTER — Telehealth: Payer: Self-pay | Admitting: Internal Medicine

## 2015-01-20 NOTE — Telephone Encounter (Signed)
Good, glad to hear, call back the day the antibiotic is finished

## 2015-01-20 NOTE — Telephone Encounter (Signed)
Patient is aware of Shane Tysinger PA message 

## 2015-01-20 NOTE — Telephone Encounter (Signed)
Pt called to let you know the spot on his leg is doing better and seems to be healing. He is still using the med for it

## 2015-02-02 ENCOUNTER — Encounter: Payer: Self-pay | Admitting: Medical

## 2015-02-02 ENCOUNTER — Ambulatory Visit (INDEPENDENT_AMBULATORY_CARE_PROVIDER_SITE_OTHER): Payer: Medicare Other | Admitting: Medical

## 2015-02-02 VITALS — BP 129/84 | HR 80 | Temp 98.0°F | Resp 15 | Wt 220.0 lb

## 2015-02-02 DIAGNOSIS — I8311 Varicose veins of right lower extremity with inflammation: Secondary | ICD-10-CM

## 2015-02-02 DIAGNOSIS — I872 Venous insufficiency (chronic) (peripheral): Secondary | ICD-10-CM

## 2015-02-02 NOTE — Progress Notes (Signed)
Subjective: Here with wife for ulcer of leg recheck.  At this point the leg is back to his normal look.   He last used silvadene 3 days ago, is continue his usual compression hose.    He is doing fine.   No other aggravating or relieving factors. No other complaint.  Past Medical History  Diagnosis Date  . Complete heart block     Status post Medtronic pacemaker  . HTN (hypertension)   . Edema   . Obesity   . Chronic combined systolic and diastolic heart failure     Ejection fraction 55% with elevated end-diastolic pressure-catheterization 3/14  . Hx of cardiovascular stress test     a. [place] MV 3/14:  Low risk, EF 36%, apical inf scar, no ischemia, inf-apical HK>> catheterization however demonstrated normal left ventricular function  . Cardiac pacemaker Medtronic   . CAD (coronary artery disease)     LHC 3/14: Distal left main 10%, LAD 20%, circumflex 10-20%, proximal RCA 20%, distal 40-50%, EF 50-55%.  . Presence of permanent cardiac pacemaker   . Pneumonia     hx of at age 89  . Melanoma   . Diabetes mellitus     Type 2   ROS as in subjective  Objective: Gen: wd, wn, nad bilat lower legs with red/brown brawny coloration from just below knee to ankles.  No appreciable leg edema today bilat, no erythema, no warmth.   sensation somewhat decreased in general bilat LE 1+ bilat LE pulses The previous ulceration and wound is resolved.  Currently no serous fluid or crusting or gray tissue. Pink throughout, improved appearing.   Assessment: Encounter Diagnosis  Name Primary?  . Venous stasis dermatitis of right lower extremity Yes    Plan: The ulcer and wound has resolved.  Glad to see him back to baseline.  advised he c/t daily use of compression hose, walking for exercise regularly, leg elevation when he has some edema, silvadene cream to keep on hand if and when he gets another similar leg ulcer.   No other aggravating or relieving factors. No other complaint.

## 2015-02-18 ENCOUNTER — Other Ambulatory Visit: Payer: Self-pay | Admitting: Medical

## 2015-03-01 ENCOUNTER — Encounter: Payer: Self-pay | Admitting: Internal Medicine

## 2015-03-06 ENCOUNTER — Other Ambulatory Visit: Payer: Self-pay | Admitting: Family Medicine

## 2015-03-15 ENCOUNTER — Other Ambulatory Visit: Payer: Self-pay | Admitting: Internal Medicine

## 2015-03-20 ENCOUNTER — Other Ambulatory Visit: Payer: Self-pay | Admitting: Family Medicine

## 2015-03-23 ENCOUNTER — Ambulatory Visit (INDEPENDENT_AMBULATORY_CARE_PROVIDER_SITE_OTHER): Payer: Medicare Other | Admitting: Family Medicine

## 2015-03-23 ENCOUNTER — Encounter: Payer: Self-pay | Admitting: Family Medicine

## 2015-03-23 VITALS — BP 150/84 | HR 64 | Ht 68.0 in | Wt 212.0 lb

## 2015-03-23 DIAGNOSIS — I5032 Chronic diastolic (congestive) heart failure: Secondary | ICD-10-CM | POA: Diagnosis not present

## 2015-03-23 DIAGNOSIS — I10 Essential (primary) hypertension: Secondary | ICD-10-CM

## 2015-03-23 DIAGNOSIS — G4733 Obstructive sleep apnea (adult) (pediatric): Secondary | ICD-10-CM | POA: Diagnosis not present

## 2015-03-23 DIAGNOSIS — E785 Hyperlipidemia, unspecified: Secondary | ICD-10-CM

## 2015-03-23 DIAGNOSIS — E1169 Type 2 diabetes mellitus with other specified complication: Secondary | ICD-10-CM

## 2015-03-23 DIAGNOSIS — E1159 Type 2 diabetes mellitus with other circulatory complications: Secondary | ICD-10-CM

## 2015-03-23 DIAGNOSIS — N3946 Mixed incontinence: Secondary | ICD-10-CM | POA: Diagnosis not present

## 2015-03-23 DIAGNOSIS — E1121 Type 2 diabetes mellitus with diabetic nephropathy: Secondary | ICD-10-CM | POA: Diagnosis not present

## 2015-03-23 DIAGNOSIS — E669 Obesity, unspecified: Secondary | ICD-10-CM

## 2015-03-23 LAB — POCT GLYCOSYLATED HEMOGLOBIN (HGB A1C): Hemoglobin A1C: 6.3

## 2015-03-23 NOTE — Progress Notes (Signed)
  Subjective:    Patient ID: Malik Rojas, male    DOB: 1932/04/17, 81 y.o.   MRN: [id number]  Malik Rojas is a 81 y.o. male who presents for follow-up of Type 2 diabetes mellitus.  Home blood sugar records: patient test one time a day Current symptoms/problems none Daily foot checks: yes   Any foot concerns:  right foot swells faster than the left Exercise: none Eyes: Has an appointment in December. He does have sleep apnea and his wife has him sleeping in a recliner which apparently helps with his snoring area to have stopped using the CPAP. He does not complain of chest pain, shortness of breath or PND.He does have difficulty with urinary incontinence and at the present time is using depends. He and his wife are comfortable with that. The following portions of the patient's history were reviewed and updated as appropriate: allergies, current medications, past medical history, past social history and problem list.  ROS as in subjective above.     Objective:    Physical Exam Alert and in no distress otherwise not examined.  Blood pressure 150/84, pulse 64, height 5\' 8"  (1.727 m), weight 212 lb (96.163 kg), SpO2 97 %.  Lab Review Diabetic Labs Latest Ref Rng 03/23/2015 01/05/2015 11/21/2014 10/10/2014 07/22/2014  HbA1c - 6.3 - 6.5 - 6.2  Chol 0 - 200 mg/dL - - - - -  HDL >39 mg/dL - - - - -  Calc LDL 0 - 99 mg/dL - - - - -  Triglycerides <150 mg/dL - - - - -  Creatinine 0.50 - 1.35 mg/dL - 1.37(H) - 1.29 -  GFR >60.00 mL/min - - - - -   BP/Weight 03/23/2015 02/02/2015 01/12/2015 12/29/2014 10/31/9240  Systolic BP 683 419 622 297 989  Diastolic BP 84 84 82 86 80  Wt. (Lbs) 212 220 223 225 226  BMI 32.24 33.46 33.91 34.22 34.37  Hemoglobin A1c is 6.3.  Malik Rojas  reports that he quit smoking about 44 years ago. He quit smokeless tobacco use about 51 years ago. He reports that he drinks alcohol. He reports that he does not use illicit drugs.     Assessment & Plan:    Type 2 diabetes  mellitus with diabetic nephropathy  Obesity (BMI 30-39.9)  Hypertension associated with diabetes - Plan: POCT glycosylated hemoglobin (Hb A1C)  Obstructive sleep apnea  Hyperlipidemia associated with type 2 diabetes mellitus  Chronic diastolic heart failure  Mixed incontinence   1. Rx changes: none 2. Education: Reviewed 'ABCs' of diabetes management (respective goals in parentheses):  A1C (<7), blood pressure (<130/80), and cholesterol (LDL <100). 3. Compliance at present is estimated to be fair. Efforts to improve compliance (if necessary) will be directed at no change. 4. Follow up: 4 months Did recommend to use his CPAP while in the recliner to prove that it is helping with his sleep apnea.

## 2015-05-11 ENCOUNTER — Telehealth: Payer: Self-pay

## 2015-05-11 NOTE — Telephone Encounter (Signed)
Medical records sent out to [facility] on 05/11/15

## 2015-07-11 ENCOUNTER — Telehealth: Payer: Self-pay

## 2015-07-11 NOTE — Telephone Encounter (Addendum)
Refill request for test strips #300 and lancets #300 to be sent to [facility] on [address] [facility]

## 2015-07-12 MED ORDER — GLUCOSE BLOOD VI STRP: 1.0000 | ORAL_STRIP | Freq: Two times a day (BID) | Status: DC

## 2015-07-12 MED ORDER — LANCETS MISC
1.0000 | Freq: Two times a day (BID) | Status: DC
Start: 2015-07-12 — End: 2015-07-12

## 2015-07-12 MED ORDER — LANCETS MISC
1.0000 | Freq: Two times a day (BID) | Status: DC
Start: 2015-07-12 — End: 2016-11-06

## 2015-07-12 NOTE — Telephone Encounter (Signed)
done

## 2015-08-02 ENCOUNTER — Encounter: Payer: Self-pay | Admitting: Family Medicine

## 2015-08-02 ENCOUNTER — Ambulatory Visit (INDEPENDENT_AMBULATORY_CARE_PROVIDER_SITE_OTHER): Payer: Medicare Other | Admitting: Family Medicine

## 2015-08-02 VITALS — BP 130/80 | HR 72 | Resp 12 | Wt 223.4 lb

## 2015-08-02 DIAGNOSIS — L539 Erythematous condition, unspecified: Secondary | ICD-10-CM | POA: Diagnosis not present

## 2015-08-02 DIAGNOSIS — I890 Lymphedema, not elsewhere classified: Secondary | ICD-10-CM

## 2015-08-02 NOTE — Progress Notes (Signed)
   Subjective:    Patient ID: Malik Rojas, male    DOB: August 11, 1931, 81 y.o.   MRN: [id number]  HPI He is brought in by his wife for evaluation of possible cellulitis. He has a previous history of cellulitis and chronic use of an antibiotic. He does have chronic lymphedema in his wife was thinking that his lower right leg was more swollen and erythematous. He does not complain of pain or discomfort. They have been using support stockings.  Review of Systems     Objective:   Physical Exam Alert and in no distress. Exam of both lower extremities does show chronic lymphatic swelling with 2-3+ pitting edema. The skin over the anterior shin is dry and brownish in appearance but not warm or tender.       Assessment & Plan:  Leg erythema  Chronic acquired lymphedema  Splane that I did not think that it was an infection but did want them to either call or come back the first of the week for reevaluation.

## 2015-08-03 ENCOUNTER — Other Ambulatory Visit: Payer: Self-pay | Admitting: Family Medicine

## 2015-08-28 ENCOUNTER — Other Ambulatory Visit: Payer: Self-pay | Admitting: Family Medicine

## 2015-09-09 ENCOUNTER — Other Ambulatory Visit: Payer: Self-pay | Admitting: Family Medicine

## 2015-09-11 ENCOUNTER — Other Ambulatory Visit: Payer: Self-pay | Admitting: Family Medicine

## 2015-09-18 ENCOUNTER — Encounter: Payer: Self-pay | Admitting: *Deleted

## 2015-09-25 ENCOUNTER — Encounter: Payer: Self-pay | Admitting: Family Medicine

## 2015-09-25 ENCOUNTER — Ambulatory Visit (INDEPENDENT_AMBULATORY_CARE_PROVIDER_SITE_OTHER): Payer: Medicare Other | Admitting: Family Medicine

## 2015-09-25 VITALS — BP 152/90 | HR 72 | Ht 65.75 in | Wt 228.4 lb

## 2015-09-25 DIAGNOSIS — E785 Hyperlipidemia, unspecified: Secondary | ICD-10-CM | POA: Diagnosis not present

## 2015-09-25 DIAGNOSIS — E1159 Type 2 diabetes mellitus with other circulatory complications: Secondary | ICD-10-CM | POA: Diagnosis not present

## 2015-09-25 DIAGNOSIS — I152 Hypertension secondary to endocrine disorders: Secondary | ICD-10-CM

## 2015-09-25 DIAGNOSIS — E0821 Diabetes mellitus due to underlying condition with diabetic nephropathy: Secondary | ICD-10-CM

## 2015-09-25 DIAGNOSIS — I890 Lymphedema, not elsewhere classified: Secondary | ICD-10-CM

## 2015-09-25 DIAGNOSIS — I10 Essential (primary) hypertension: Secondary | ICD-10-CM

## 2015-09-25 DIAGNOSIS — E669 Obesity, unspecified: Secondary | ICD-10-CM

## 2015-09-25 DIAGNOSIS — I5032 Chronic diastolic (congestive) heart failure: Secondary | ICD-10-CM

## 2015-09-25 DIAGNOSIS — N3946 Mixed incontinence: Secondary | ICD-10-CM

## 2015-09-25 DIAGNOSIS — E1169 Type 2 diabetes mellitus with other specified complication: Secondary | ICD-10-CM | POA: Diagnosis not present

## 2015-09-25 MED ORDER — SITAGLIPTIN PHOS-METFORMIN HCL 50-1000 MG PO TABS: ORAL_TABLET | ORAL | Status: DC

## 2015-09-25 NOTE — Progress Notes (Signed)
   Subjective:    Patient ID: Malik Rojas, male    DOB: [birth year], 82 y.o.   MRN: [id number]  HPI He is here for a follow-up visit. He does have underlying diabetes. He states his blood sugars in the morning usually are below 150. He does not check it at other times. His exercise is quite limited. He is quite deconditioned. He usually gets yearly eye exams. He is just check his feet periodically. Smoking and drinking were reviewed. He has had difficulty with skin cancer and has had some surgeries. He has had no difficulty with chest pain, shortness of breath, PND or orthopnea.He has had no more difficulty with lower leg edema. He has had some urinary issues but is getting physical therapy for this which has helped. His medications as well as health maintenance and social history was reviewed.   Review of Systems     Objective:   Physical Exam Alert and in no distress otherwise not examined Hemoglobin A1c is 6.6       Assessment & Plan:  Diabetes mellitus due to underlying condition with diabetic nephropathy, without long-term current use of insulin (HCC) - Plan: POCT A1C, sitaGLIPtin-metformin (JANUMET) 50-1000 MG tablet  Chronic diastolic heart failure (HCC)  Chronic acquired lymphedema  Hyperlipidemia associated with type 2 diabetes mellitus ([place])  Hypertension associated with diabetes ([place])  Obesity (BMI 30-39.9)  Mixed incontinence I encouraged him to continue to work on his urinary issues. Continue on his present medications. At this point he seems to be holding his own.

## 2015-09-25 NOTE — Patient Instructions (Signed)
Check it during the day either before a meal or to our after meals

## 2015-09-28 ENCOUNTER — Encounter: Payer: Self-pay | Admitting: Family Medicine

## 2015-10-12 ENCOUNTER — Ambulatory Visit (INDEPENDENT_AMBULATORY_CARE_PROVIDER_SITE_OTHER): Payer: Medicare Other | Admitting: Internal Medicine

## 2015-10-12 ENCOUNTER — Encounter: Payer: Self-pay | Admitting: Internal Medicine

## 2015-10-12 VITALS — BP 142/78 | HR 63 | Ht 67.0 in | Wt 228.2 lb

## 2015-10-12 DIAGNOSIS — I442 Atrioventricular block, complete: Secondary | ICD-10-CM | POA: Diagnosis not present

## 2015-10-12 DIAGNOSIS — I5030 Unspecified diastolic (congestive) heart failure: Secondary | ICD-10-CM

## 2015-10-12 DIAGNOSIS — Z950 Presence of cardiac pacemaker: Secondary | ICD-10-CM | POA: Diagnosis not present

## 2015-10-12 DIAGNOSIS — Z79899 Other long term (current) drug therapy: Secondary | ICD-10-CM | POA: Diagnosis not present

## 2015-10-12 LAB — BASIC METABOLIC PANEL
BUN: 23 mg/dL (ref 7–25)
CO2: 27 mmol/L (ref 20–31)
CREATININE: 1.67 mg/dL — AB (ref 0.70–1.11)
Calcium: 8.8 mg/dL (ref 8.6–10.3)
Chloride: 104 mmol/L (ref 98–110)
GLUCOSE: 108 mg/dL — AB (ref 65–99)
POTASSIUM: 4.5 mmol/L (ref 3.5–5.3)
Sodium: 140 mmol/L (ref 135–146)

## 2015-10-12 LAB — CUP PACEART INCLINIC DEVICE CHECK
Date Time Interrogation Session: 20170309154359
Implantable Lead Implant Date: 20051205
Implantable Lead Implant Date: 20051205
Implantable Lead Location: 753860
Lead Channel Impedance Value: 535 Ohm
Lead Channel Impedance Value: 741 Ohm
Lead Channel Pacing Threshold Amplitude: 1.75 V
Lead Channel Sensing Intrinsic Amplitude: 2.8 mV
Lead Channel Setting Pacing Amplitude: 2.5 V
Lead Channel Setting Pacing Pulse Width: 0.4 ms
MDC IDC LEAD LOCATION: 753859
MDC IDC MSMT LEADCHNL RA PACING THRESHOLD PULSEWIDTH: 0.76 ms
MDC IDC MSMT LEADCHNL RV PACING THRESHOLD AMPLITUDE: 0.5 V
MDC IDC MSMT LEADCHNL RV PACING THRESHOLD PULSEWIDTH: 0.4 ms
MDC IDC SET LEADCHNL RA PACING AMPLITUDE: 2.5 V
MDC IDC SET LEADCHNL RV SENSING SENSITIVITY: 2.8 mV

## 2015-10-12 NOTE — Progress Notes (Signed)
Patient Care Team: Denita Lung, MD as PCP - General   HPI  Malik Rojas is a 82 y.o. male seen in followup for pacemaker implantation for high-grade heart block and presyncope.  March 2014 left heart cath showed no significant coronary disease;  ejection fraction by cath was 50-55% as opposed to a noninvasive imaging. This had measured an ejection fraction of 25%. He was treated for heart failure with IV Lasix and BiPAP.    He underwent pacemaker generator replacement 6/14  He is coming in today in addition for surgical clearance. He is having problems with urination and is apparently scheduled to undergo urethral dilatation  He is able to walk 2-300 feet. He carries groceries some. He has no stairs.  Past Medical History  Diagnosis Date  . Complete heart block (HCC)     Status post Medtronic pacemaker  . HTN (hypertension)   . Edema   . Obesity   . Chronic combined systolic and diastolic heart failure (HCC)     Ejection fraction 55% with elevated end-diastolic pressure-catheterization 3/14  . Hx of cardiovascular stress test     a. [place] MV 3/14:  Low risk, EF 36%, apical inf scar, no ischemia, inf-apical HK>> catheterization however demonstrated normal left ventricular function  . Cardiac pacemaker Medtronic   . CAD (coronary artery disease)     LHC 3/14: Distal left main 10%, LAD 20%, circumflex 10-20%, proximal RCA 20%, distal 40-50%, EF 50-55%.  . Presence of permanent cardiac pacemaker   . Pneumonia     hx of at age 57  . Melanoma ([place])   . Diabetes mellitus     Type 2    Past Surgical History  Procedure Laterality Date  . Pacemaker insertion  ~1999    medtronic EnRhythm  . Permanent pacemaker generator change N/A 02/12/2013    Procedure: PERMANENT PACEMAKER GENERATOR CHANGE;  Surgeon: Deboraha Sprang, MD;  Location: [facility] CATH LAB;  Service: Cardiovascular;  Laterality: N/A;  . Mohs surgery      being monitored-every 69months  . Tonsillectomy      as  child  . Appendectomy  40 years ago  . Transurethral resection of prostate N/A 10/13/2014    Procedure: WOLF TRANSURETHRAL RESECTION OF THE PROSTATE (TURP);  Surgeon: Carolan Clines, MD;  Location: [facility] ORS;  Service: Urology;  Laterality: N/A;    Current Outpatient Prescriptions  Medication Sig Dispense Refill  . aspirin 81 MG tablet Take 81 mg by mouth daily.    Marland Kitchen glucose blood test strip 1 each by Other route 2 (two) times daily. Use as instructed THIS IS FOR THE ACCUCHECK DX 250.00 200 each 0  . labetalol (NORMODYNE) 200 MG tablet Take 2 tablets (400 mg total) by mouth 2 (two) times daily. 120 tablet 11  . Lancets MISC 1 each by Does not apply route 2 (two) times daily. (THIS IS FOR ACCUCHECK) 200 each 0  . lisinopril (PRINIVIL,ZESTRIL) 10 MG tablet TAKE ONE(1) TABLET BY MOUTH DAILY 90 tablet 0  . Omega-3 Fatty Acids (FISH OIL) 1000 MG CAPS Take 1,000 mg by mouth daily.     . pravastatin (PRAVACHOL) 80 MG tablet TAKE 1 TABLET BY MOUTH EVERY DAY 90 tablet 0  . silver sulfADIAZINE (SILVADENE) 1 % cream Apply 1 application topically daily. 50 g 0  . sitaGLIPtin-metformin (JANUMET) 50-1000 MG tablet TAKE 1 TABLET BY MOUTH TWICE DAILY WITH A MEAL 180 tablet 1   No current facility-administered medications for  this visit.    No Known Allergies  Review of Systems negative except from HPI and PMH  Physical Exam BP 142/78 mmHg  Pulse 63  Ht 5\' 7"  (1.702 m)  Wt 228 lb 3.2 oz (103.511 kg)  BMI 35.73 kg/m2 Well developed and well nourished in no acute distress HENT normal E scleral and icterus clear Neck Supple JVP flat  carotids brisk and full Clear to ausculation  2/6 early systolic murmur  Regular rate and rhythm,  Soft with active bowel sounds No clubbing cyanosis 1+ Edema Alert and oriented, grossly normal motor and sensory function Skin Warm and Dry  ECG demonstrates AV pacing  Assessment and  Plan  Complete heart block-pacemaker dependent   Pacemaker-Medtronic  The  patient's device was interrogated.  The information was reviewed. No changes were made in the programming.    Hypertension   Cardiomyopathy  HFpEF/shearing of Mr. Glendell Docker also continued to rEF  Ventricular tachycardia-nonsustained    His blood pressure control is improved on the higher doses of labetalol  renal function 9 months ago had a creatinine of 1.37. We'll check it again today on his lisinopril...  He would like not to take diuretics.  LV function was moderately depressed compared to 3 years ago. With his nonsustained ventricular tachycardia we will reassess LV function with an echocardiogram

## 2015-10-12 NOTE — Patient Instructions (Addendum)
Medication Instructions:  Your physician recommends that you continue on your current medications as directed. Please refer to the Current Medication list given to you today.  Labwork: BMET today  Testing/Procedures: Your physician has requested that you have an echocardiogram. Echocardiography is a painless test that uses sound waves to create images of your heart. It provides your doctor with information about the size and shape of your heart and how well your heart's chambers and valves are working. This procedure takes approximately one hour. There are no restrictions for this procedure.  Follow-Up: Remote monitoring is used to monitor your Pacemaker of ICD from home. This monitoring reduces the number of office visits required to check your device to one time per year. It allows [place] to keep an eye on the functioning of your device to ensure it is working properly. You are scheduled for a device check from home on 01/11/2016. You may send your transmission at any time that day. If you have a wireless device, the transmission will be sent automatically. After your physician reviews your transmission, you will receive a postcard with your next transmission date.  Your physician wants you to follow-up in: 1 year with Dr. Caryl Comes .  You will receive a reminder letter in the mail two months in advance. If you don't receive a letter, please call our office to schedule the follow-up appointment.  If you need a refill on your cardiac medications before your next appointment, please call your pharmacy.  Thank you for choosing [facility]!!

## 2015-10-24 ENCOUNTER — Telehealth: Payer: Self-pay | Admitting: Internal Medicine

## 2015-10-24 NOTE — Telephone Encounter (Signed)
Cassandra, nurse from pt's insurance is out at the house doing assesment and his bp is high. Left arm 200/80 and right arm 170/80. Took bp med at 6:00am. Having no symptoms of headaches, chest pain or anything. Advise [name] of pt and he states to have patient to check bp over the next couple days to see if its still high.

## 2015-10-26 ENCOUNTER — Other Ambulatory Visit: Payer: Self-pay | Admitting: Family Medicine

## 2015-10-30 ENCOUNTER — Ambulatory Visit (HOSPITAL_COMMUNITY): Payer: Medicare Other | Attending: Cardiology

## 2015-10-30 ENCOUNTER — Other Ambulatory Visit: Payer: Self-pay

## 2015-10-30 DIAGNOSIS — I5030 Unspecified diastolic (congestive) heart failure: Secondary | ICD-10-CM | POA: Diagnosis present

## 2015-10-30 DIAGNOSIS — I110 Hypertensive heart disease with heart failure: Secondary | ICD-10-CM | POA: Diagnosis not present

## 2015-10-30 DIAGNOSIS — I351 Nonrheumatic aortic (valve) insufficiency: Secondary | ICD-10-CM | POA: Insufficient documentation

## 2015-10-30 DIAGNOSIS — I071 Rheumatic tricuspid insufficiency: Secondary | ICD-10-CM | POA: Insufficient documentation

## 2015-10-30 DIAGNOSIS — E119 Type 2 diabetes mellitus without complications: Secondary | ICD-10-CM | POA: Insufficient documentation

## 2015-10-30 DIAGNOSIS — I340 Nonrheumatic mitral (valve) insufficiency: Secondary | ICD-10-CM | POA: Insufficient documentation

## 2015-11-03 ENCOUNTER — Other Ambulatory Visit: Payer: Self-pay | Admitting: Family Medicine

## 2015-11-07 ENCOUNTER — Telehealth: Payer: Self-pay | Admitting: Internal Medicine

## 2015-11-07 NOTE — Telephone Encounter (Signed)
PT AWARE OF ECHO RESULTS./CY 

## 2015-11-07 NOTE — Telephone Encounter (Signed)
Malik Rojas is calling to get his results of his Echo . Please call  Thanks

## 2016-01-15 ENCOUNTER — Other Ambulatory Visit: Payer: Self-pay | Admitting: Family Medicine

## 2016-01-15 ENCOUNTER — Ambulatory Visit (INDEPENDENT_AMBULATORY_CARE_PROVIDER_SITE_OTHER): Payer: Medicare Other | Admitting: *Deleted

## 2016-01-15 ENCOUNTER — Telehealth: Payer: Self-pay | Admitting: Cardiology

## 2016-01-15 DIAGNOSIS — I442 Atrioventricular block, complete: Secondary | ICD-10-CM | POA: Diagnosis not present

## 2016-01-15 NOTE — Telephone Encounter (Signed)
Spoke with pt and reminded pt of remote transmission that is due today. Pt verbalized understanding.   

## 2016-01-15 NOTE — Progress Notes (Signed)
Remote pacemaker transmission.   

## 2016-01-15 NOTE — Telephone Encounter (Signed)
Pt has an appt in august. Will refill til then 

## 2016-01-18 LAB — CUP PACEART REMOTE DEVICE CHECK
Battery Impedance: 229 Ohm
Brady Statistic AP VP Percent: 73 %
Brady Statistic AP VS Percent: 0 %
Brady Statistic AS VP Percent: 27 %
Brady Statistic AS VS Percent: 0 %
Date Time Interrogation Session: 20170612145026
Implantable Lead Implant Date: 20051205
Implantable Lead Location: 753859
Implantable Lead Location: 753860
Implantable Lead Model: 5076
Lead Channel Impedance Value: 562 Ohm
Lead Channel Impedance Value: 800 Ohm
Lead Channel Pacing Threshold Amplitude: 0.625 V
Lead Channel Pacing Threshold Pulse Width: 0.4 ms
Lead Channel Sensing Intrinsic Amplitude: 2.8 mV
Lead Channel Setting Pacing Pulse Width: 0.4 ms
MDC IDC LEAD IMPLANT DT: 20051205
MDC IDC MSMT BATTERY REMAINING LONGEVITY: 98 mo
MDC IDC MSMT BATTERY VOLTAGE: 2.79 V
MDC IDC SET LEADCHNL RA PACING AMPLITUDE: 2.5 V
MDC IDC SET LEADCHNL RV PACING AMPLITUDE: 2.5 V
MDC IDC SET LEADCHNL RV SENSING SENSITIVITY: 2.8 mV

## 2016-01-25 ENCOUNTER — Encounter: Payer: Self-pay | Admitting: Cardiology

## 2016-02-07 ENCOUNTER — Other Ambulatory Visit: Payer: Self-pay | Admitting: Family Medicine

## 2016-02-20 DIAGNOSIS — C4492 Squamous cell carcinoma of skin, unspecified: Secondary | ICD-10-CM | POA: Insufficient documentation

## 2016-03-01 ENCOUNTER — Encounter: Payer: Self-pay | Admitting: Family Medicine

## 2016-03-13 ENCOUNTER — Other Ambulatory Visit: Payer: Self-pay | Admitting: Family Medicine

## 2016-03-20 ENCOUNTER — Ambulatory Visit (INDEPENDENT_AMBULATORY_CARE_PROVIDER_SITE_OTHER): Payer: Medicare Other | Admitting: Family Medicine

## 2016-03-20 ENCOUNTER — Encounter: Payer: Self-pay | Admitting: Family Medicine

## 2016-03-20 VITALS — BP 150/80 | HR 70 | Ht 67.0 in | Wt 223.8 lb

## 2016-03-20 DIAGNOSIS — I890 Lymphedema, not elsewhere classified: Secondary | ICD-10-CM | POA: Diagnosis not present

## 2016-03-20 DIAGNOSIS — E785 Hyperlipidemia, unspecified: Secondary | ICD-10-CM

## 2016-03-20 DIAGNOSIS — E0821 Diabetes mellitus due to underlying condition with diabetic nephropathy: Secondary | ICD-10-CM | POA: Diagnosis not present

## 2016-03-20 DIAGNOSIS — E1159 Type 2 diabetes mellitus with other circulatory complications: Secondary | ICD-10-CM

## 2016-03-20 DIAGNOSIS — E1169 Type 2 diabetes mellitus with other specified complication: Secondary | ICD-10-CM

## 2016-03-20 DIAGNOSIS — E669 Obesity, unspecified: Secondary | ICD-10-CM

## 2016-03-20 DIAGNOSIS — I10 Essential (primary) hypertension: Secondary | ICD-10-CM | POA: Diagnosis not present

## 2016-03-20 LAB — POCT GLYCOSYLATED HEMOGLOBIN (HGB A1C): HEMOGLOBIN A1C: 6.8

## 2016-03-20 NOTE — Progress Notes (Signed)
  Subjective:    Patient ID: Malik Rojas, male    DOB: 1931/09/16, 82 y.o.   MRN: [id number]  Malik Rojas is a 82 y.o. male who presents for follow-up of Type 2 diabetes mellitus.  Patient ischecking home blood sugars.   Home blood sugar records: 130 to 200 How often is blood sugars being checked:  BID Current symptoms/problems None Daily foot checks: yes   Any foot concerns: Concerns about needing diabetic shoes Last eye exam: this year going back in 6 weeks Exercise: none he continues on Janumet but does have concerns over the cost. He also continues on his blood pressure medications as well as statin.  The following portions of the patient's history were reviewed and updated as appropriate: allergies, current medications, past medical history, past social history and problem list.  ROS as in subjective above.     Objective:    Physical Exam Alert and in no distress otherwise not examined.   Lab Review Diabetic Labs Latest Ref Rng & Units 10/12/2015 03/23/2015 01/05/2015 11/21/2014 10/10/2014  HbA1c - - 6.3 - 6.5 -  Chol 0 - 200 mg/dL - - - - -  HDL >39 mg/dL - - - - -  Calc LDL 0 - 99 mg/dL - - - - -  Triglycerides <150 mg/dL - - - - -  Creatinine 0.70 - 1.11 mg/dL 1.67(H) - 1.37(H) - 1.29  GFR >60.00 mL/min - - - - -   BP/Weight 10/12/2015 09/25/2015 08/02/2015 03/23/2015 0000000  Systolic BP [id number] 0000000 [id number] [id number]  Diastolic BP 78 90 80 84 84  Wt. (Lbs) 228.2 228.4 223.4 212 220  BMI 35.73 37.15 33.98 32.24 33.46  A1c is 6.8  Malik Rojas  reports that he quit smoking about 45 years ago. He has a 36.00 pack-year smoking history. He quit smokeless tobacco use about 52 years ago. He reports that he drinks alcohol. He reports that he does not use drugs.     Assessment & Plan:    Diabetes mellitus due to underlying condition with diabetic nephropathy, without long-term current use of insulin (HCC)  Chronic acquired lymphedema  Hyperlipidemia associated with type 2 diabetes  mellitus ([place])  Hypertension associated with diabetes ([place])  Obesity (BMI 30-39.9)     1. Rx changes: none 2. Education: Reviewed 'ABCs' of diabetes management (respective goals in parentheses):  A1C (<7), blood pressure (<130/80), and cholesterol (LDL <100). 3. Compliance at present is estimated to be good. Efforts to improve compliance (if necessary) will be directed at increased exercise. 4. Follow up: 4 months I explained that the cost of his Janumet is actually fairly reasonable and he will soon be going into the doughnut hole which unfortunately I cannot help with. Discussed diabetic shoes with him and recommended that he talk to his podiatrist about sending the information concerning his true need. Explained that we cannot just give him a prescription for diabetic shoes without having adequate documentation.

## 2016-03-21 ENCOUNTER — Ambulatory Visit: Payer: Medicare Other | Admitting: Family Medicine

## 2016-04-15 ENCOUNTER — Telehealth: Payer: Self-pay | Admitting: Cardiology

## 2016-04-15 ENCOUNTER — Ambulatory Visit (INDEPENDENT_AMBULATORY_CARE_PROVIDER_SITE_OTHER): Payer: Medicare Other | Admitting: *Deleted

## 2016-04-15 DIAGNOSIS — I442 Atrioventricular block, complete: Secondary | ICD-10-CM | POA: Diagnosis not present

## 2016-04-15 NOTE — Telephone Encounter (Signed)
Spoke with pt and reminded pt of remote transmission that is due today. Pt verbalized understanding.   

## 2016-04-16 NOTE — Progress Notes (Signed)
Remote pacemaker transmission.   

## 2016-04-17 LAB — CUP PACEART REMOTE DEVICE CHECK
Battery Impedance: 229 Ohm
Battery Voltage: 2.79 V
Brady Statistic AP VS Percent: 0 %
Brady Statistic AS VP Percent: 28 %
Date Time Interrogation Session: 20170911234756
Implantable Lead Implant Date: 20051205
Implantable Lead Location: 753860
Implantable Lead Model: 5076
Lead Channel Impedance Value: 562 Ohm
Lead Channel Pacing Threshold Amplitude: 0.5 V
Lead Channel Setting Pacing Amplitude: 2.5 V
Lead Channel Setting Sensing Sensitivity: 2.8 mV
MDC IDC LEAD IMPLANT DT: 20051205
MDC IDC LEAD LOCATION: 753859
MDC IDC MSMT BATTERY REMAINING LONGEVITY: 98 mo
MDC IDC MSMT LEADCHNL RV IMPEDANCE VALUE: 779 Ohm
MDC IDC MSMT LEADCHNL RV PACING THRESHOLD PULSEWIDTH: 0.4 ms
MDC IDC SET LEADCHNL RA PACING AMPLITUDE: 2.5 V
MDC IDC SET LEADCHNL RV PACING PULSEWIDTH: 0.4 ms
MDC IDC STAT BRADY AP VP PERCENT: 72 %
MDC IDC STAT BRADY AS VS PERCENT: 0 %

## 2016-04-18 ENCOUNTER — Encounter: Payer: Self-pay | Admitting: Cardiology

## 2016-04-18 ENCOUNTER — Other Ambulatory Visit: Payer: Self-pay | Admitting: Family Medicine

## 2016-05-08 ENCOUNTER — Other Ambulatory Visit: Payer: Self-pay | Admitting: Family Medicine

## 2016-05-13 ENCOUNTER — Other Ambulatory Visit: Payer: Self-pay | Admitting: Family Medicine

## 2016-05-22 ENCOUNTER — Other Ambulatory Visit: Payer: Self-pay | Admitting: Family Medicine

## 2016-07-15 ENCOUNTER — Ambulatory Visit (INDEPENDENT_AMBULATORY_CARE_PROVIDER_SITE_OTHER): Payer: Medicare Other | Admitting: *Deleted

## 2016-07-15 DIAGNOSIS — I442 Atrioventricular block, complete: Secondary | ICD-10-CM

## 2016-07-15 NOTE — Progress Notes (Signed)
Remote pacemaker transmission.   

## 2016-07-18 ENCOUNTER — Encounter: Payer: Self-pay | Admitting: Family Medicine

## 2016-07-19 ENCOUNTER — Encounter: Payer: Self-pay | Admitting: Cardiology

## 2016-07-23 ENCOUNTER — Encounter: Payer: Self-pay | Admitting: Family Medicine

## 2016-07-23 ENCOUNTER — Ambulatory Visit (INDEPENDENT_AMBULATORY_CARE_PROVIDER_SITE_OTHER): Payer: Medicare Other | Admitting: Family Medicine

## 2016-07-23 VITALS — BP 130/80 | HR 85 | Ht 67.0 in | Wt 225.0 lb

## 2016-07-23 DIAGNOSIS — E1169 Type 2 diabetes mellitus with other specified complication: Secondary | ICD-10-CM | POA: Diagnosis not present

## 2016-07-23 DIAGNOSIS — I890 Lymphedema, not elsewhere classified: Secondary | ICD-10-CM

## 2016-07-23 DIAGNOSIS — E669 Obesity, unspecified: Secondary | ICD-10-CM

## 2016-07-23 DIAGNOSIS — E0821 Diabetes mellitus due to underlying condition with diabetic nephropathy: Secondary | ICD-10-CM | POA: Diagnosis not present

## 2016-07-23 DIAGNOSIS — E785 Hyperlipidemia, unspecified: Secondary | ICD-10-CM | POA: Diagnosis not present

## 2016-07-23 DIAGNOSIS — E1159 Type 2 diabetes mellitus with other circulatory complications: Secondary | ICD-10-CM | POA: Diagnosis not present

## 2016-07-23 DIAGNOSIS — I10 Essential (primary) hypertension: Secondary | ICD-10-CM | POA: Diagnosis not present

## 2016-07-23 DIAGNOSIS — I152 Hypertension secondary to endocrine disorders: Secondary | ICD-10-CM

## 2016-07-23 LAB — CBC WITH DIFFERENTIAL/PLATELET
BASOS ABS: 0 {cells}/uL (ref 0–200)
Basophils Relative: 0 %
Eosinophils Absolute: 148 cells/uL (ref 15–500)
Eosinophils Relative: 2 %
HEMATOCRIT: 44.1 % (ref 38.5–50.0)
HEMOGLOBIN: 14.4 g/dL (ref 13.2–17.1)
LYMPHS ABS: 1480 {cells}/uL (ref 850–3900)
Lymphocytes Relative: 20 %
MCH: 30.2 pg (ref 27.0–33.0)
MCHC: 32.7 g/dL (ref 32.0–36.0)
MCV: 92.5 fL (ref 80.0–100.0)
MONO ABS: 592 {cells}/uL (ref 200–950)
MPV: 10.2 fL (ref 7.5–12.5)
Monocytes Relative: 8 %
NEUTROS PCT: 70 %
Neutro Abs: 5180 cells/uL (ref 1500–7800)
Platelets: 196 10*3/uL (ref 140–400)
RBC: 4.77 MIL/uL (ref 4.20–5.80)
RDW: 14.3 % (ref 11.0–15.0)
WBC: 7.4 10*3/uL (ref 4.0–10.5)

## 2016-07-23 LAB — POCT GLYCOSYLATED HEMOGLOBIN (HGB A1C): Hemoglobin A1C: 6.6

## 2016-07-23 LAB — POCT UA - MICROALBUMIN
Albumin/Creatinine Ratio, Urine, POC: >241.9
Creatinine, POC: 124 mg/dL
Microalbumin Ur, POC: >300 mg/L

## 2016-07-23 NOTE — Progress Notes (Signed)
  Subjective:    Patient ID: Malik Rojas, male    DOB: [birth year], 82 y.o.   MRN: [id number]  Malik Rojas is a 82 y.o. male who presents for follow-up of Type 2 diabetes mellitus.  Patient is checking home blood sugars.   Home blood sugar records: 100 to 130 How often is blood sugars being checked: daily Current symptoms/problems none Daily foot checks: yes  Any foot concerns: none Last eye exam: 6/17  Exercise: all the time busy doing something He continues on his blood pressure medications as well as meds for his diabetes and is having no problem with that. He also is taking OTC medications. His wife is using Silvadene on his legs. He does have underlying chronic lymphedema and seems be doing okay on that  The following portions of the patient's history were reviewed and updated as appropriate: allergies, current medications, past medical history, past social history and problem list.  ROS as in subjective above.     Objective:    Physical Exam Alert and in no distress .Exam of his lower extremities shows fairly good sensation but pulses were not able to be appreciated. He has chronic changes to his calf with pigment changes as well as drying of the skin.  Lab Review Diabetic Labs Latest Ref Rng & Units 03/20/2016 10/12/2015 03/23/2015 01/05/2015 11/21/2014  HbA1c - 6.8 - 6.3 - 6.5  Microalbumin mg/L - - - - -  Micro/Creat Ratio - - - - - -  Chol 0 - 200 mg/dL - - - - -  HDL >39 mg/dL - - - - -  Calc LDL 0 - 99 mg/dL - - - - -  Triglycerides <150 mg/dL - - - - -  Creatinine 0.70 - 1.11 mg/dL - 1.67(H) - 1.37(H) -  GFR >60.00 mL/min - - - - -   BP/Weight 03/20/2016 10/12/2015 09/25/2015 08/02/2015 [id number]  Systolic BP Q000111Q A999333 0000000 [id number] [id number]  Diastolic BP 80 78 90 80 84  Wt. (Lbs) 223.8 228.2 228.4 223.4 212  BMI 35.05 35.73 37.15 33.98 32.24  A1c is 6.6 Nehal  reports that he quit smoking about 45 years ago. He has a 36.00 pack-year smoking history. He quit smokeless tobacco  use about 52 years ago. He reports that he drinks alcohol. He reports that he does not use drugs.     Assessment & Plan:    Diabetes mellitus due to underlying condition with diabetic nephropathy, without long-term current use of insulin ([place]) - Plan: HgB A1c, CBC with Differential/Platelet, Comprehensive metabolic panel, Lipid panel, POCT UA - Microalbumin  Chronic acquired lymphedema  Hypertension associated with diabetes ([place]) - Plan: CBC with Differential/Platelet, Comprehensive metabolic panel  Hyperlipidemia associated with type 2 diabetes mellitus ([place]) - Plan: Lipid panel  Obesity (BMI 30-39.9) - Plan: CBC with Differential/Platelet, Comprehensive metabolic panel    1. Rx changes: none 2. Education: Reviewed 'ABCs' of diabetes management (respective goals in parentheses):  A1C (<7), blood pressure (<130/80), and cholesterol (LDL <100). 3. Compliance at present is estimated to be fair. Efforts to improve compliance (if necessary) will be directed at increased exercise. Exercise for him is quite challenging due to his poor overall deconditioning and the condition of his legs. 4. Follow up: 4 months His wife will continue to take good care of his legs and uses support stockings.

## 2016-07-24 LAB — LIPID PANEL
CHOLESTEROL: 134 mg/dL (ref <200)
HDL: 39 mg/dL — AB (ref >40)
LDL CALC: 53 mg/dL (ref <100)
TRIGLYCERIDES: 211 mg/dL — AB (ref <150)
Total CHOL/HDL Ratio: 3.4 Ratio (ref <5.0)
VLDL: 42 mg/dL — ABNORMAL HIGH (ref <30)

## 2016-07-24 LAB — COMPREHENSIVE METABOLIC PANEL
ALBUMIN: 4.1 g/dL (ref 3.6–5.1)
ALT: 10 U/L (ref 9–46)
AST: 14 U/L (ref 10–35)
Alkaline Phosphatase: 60 U/L (ref 40–115)
BILIRUBIN TOTAL: 1 mg/dL (ref 0.2–1.2)
BUN: 26 mg/dL — ABNORMAL HIGH (ref 7–25)
CALCIUM: 9 mg/dL (ref 8.6–10.3)
CHLORIDE: 105 mmol/L (ref 98–110)
CO2: 27 mmol/L (ref 20–31)
Creat: 1.65 mg/dL — ABNORMAL HIGH (ref 0.70–1.11)
Glucose, Bld: 111 mg/dL — ABNORMAL HIGH (ref 65–99)
Potassium: 4.5 mmol/L (ref 3.5–5.3)
Sodium: 143 mmol/L (ref 135–146)
Total Protein: 6.9 g/dL (ref 6.1–8.1)

## 2016-07-25 LAB — CUP PACEART REMOTE DEVICE CHECK
Battery Impedance: 254 Ohm
Battery Voltage: 2.79 V
Brady Statistic AS VP Percent: 28 %
Date Time Interrogation Session: 20171211151706
Implantable Lead Implant Date: 20051205
Implantable Lead Implant Date: 20051205
Implantable Lead Location: 753859
Implantable Lead Model: 5076
Lead Channel Setting Pacing Amplitude: 2.5 V
Lead Channel Setting Pacing Pulse Width: 0.4 ms
MDC IDC LEAD LOCATION: 753860
MDC IDC MSMT BATTERY REMAINING LONGEVITY: 95 mo
MDC IDC MSMT LEADCHNL RA IMPEDANCE VALUE: 560 Ohm
MDC IDC MSMT LEADCHNL RA SENSING INTR AMPL: 2.8 mV
MDC IDC MSMT LEADCHNL RV IMPEDANCE VALUE: 798 Ohm
MDC IDC MSMT LEADCHNL RV PACING THRESHOLD AMPLITUDE: 0.5 V
MDC IDC MSMT LEADCHNL RV PACING THRESHOLD PULSEWIDTH: 0.4 ms
MDC IDC PG IMPLANT DT: 20140711
MDC IDC SET LEADCHNL RV PACING AMPLITUDE: 2.5 V
MDC IDC SET LEADCHNL RV SENSING SENSITIVITY: 2.8 mV
MDC IDC STAT BRADY AP VP PERCENT: 72 %
MDC IDC STAT BRADY AP VS PERCENT: 0 %
MDC IDC STAT BRADY AS VS PERCENT: 0 %

## 2016-08-07 DIAGNOSIS — E119 Type 2 diabetes mellitus without complications: Secondary | ICD-10-CM | POA: Diagnosis not present

## 2016-08-07 DIAGNOSIS — H5212 Myopia, left eye: Secondary | ICD-10-CM | POA: Diagnosis not present

## 2016-08-07 DIAGNOSIS — H04123 Dry eye syndrome of bilateral lacrimal glands: Secondary | ICD-10-CM | POA: Diagnosis not present

## 2016-08-07 DIAGNOSIS — H524 Presbyopia: Secondary | ICD-10-CM | POA: Diagnosis not present

## 2016-08-07 DIAGNOSIS — H52222 Regular astigmatism, left eye: Secondary | ICD-10-CM | POA: Diagnosis not present

## 2016-08-07 LAB — HM DIABETES EYE EXAM

## 2016-08-15 DIAGNOSIS — L84 Corns and callosities: Secondary | ICD-10-CM | POA: Diagnosis not present

## 2016-08-15 DIAGNOSIS — L603 Nail dystrophy: Secondary | ICD-10-CM | POA: Diagnosis not present

## 2016-08-15 DIAGNOSIS — E1151 Type 2 diabetes mellitus with diabetic peripheral angiopathy without gangrene: Secondary | ICD-10-CM | POA: Diagnosis not present

## 2016-08-15 DIAGNOSIS — I739 Peripheral vascular disease, unspecified: Secondary | ICD-10-CM | POA: Diagnosis not present

## 2016-08-19 ENCOUNTER — Other Ambulatory Visit: Payer: Self-pay | Admitting: Family Medicine

## 2016-08-20 DIAGNOSIS — D1801 Hemangioma of skin and subcutaneous tissue: Secondary | ICD-10-CM | POA: Diagnosis not present

## 2016-08-20 DIAGNOSIS — L821 Other seborrheic keratosis: Secondary | ICD-10-CM | POA: Diagnosis not present

## 2016-08-20 DIAGNOSIS — C44519 Basal cell carcinoma of skin of other part of trunk: Secondary | ICD-10-CM | POA: Diagnosis not present

## 2016-08-20 DIAGNOSIS — L814 Other melanin hyperpigmentation: Secondary | ICD-10-CM | POA: Diagnosis not present

## 2016-08-20 DIAGNOSIS — L570 Actinic keratosis: Secondary | ICD-10-CM | POA: Diagnosis not present

## 2016-08-20 DIAGNOSIS — D485 Neoplasm of uncertain behavior of skin: Secondary | ICD-10-CM | POA: Diagnosis not present

## 2016-09-04 DIAGNOSIS — C44519 Basal cell carcinoma of skin of other part of trunk: Secondary | ICD-10-CM | POA: Diagnosis not present

## 2016-09-09 ENCOUNTER — Other Ambulatory Visit: Payer: Self-pay | Admitting: Family Medicine

## 2016-09-20 ENCOUNTER — Other Ambulatory Visit: Payer: Self-pay | Admitting: Family Medicine

## 2016-10-01 ENCOUNTER — Ambulatory Visit (INDEPENDENT_AMBULATORY_CARE_PROVIDER_SITE_OTHER): Payer: Medicare Other | Admitting: Family Medicine

## 2016-10-01 VITALS — BP 150/90 | HR 93 | Wt 218.0 lb

## 2016-10-01 DIAGNOSIS — S80812A Abrasion, left lower leg, initial encounter: Secondary | ICD-10-CM

## 2016-10-01 DIAGNOSIS — R0602 Shortness of breath: Secondary | ICD-10-CM

## 2016-10-01 DIAGNOSIS — L089 Local infection of the skin and subcutaneous tissue, unspecified: Secondary | ICD-10-CM

## 2016-10-01 NOTE — Patient Instructions (Signed)
Try some Metamucil

## 2016-10-01 NOTE — Progress Notes (Signed)
   Subjective:    Patient ID: Malik Rojas, male    DOB: 1932-01-10, 83 y.o.   MRN: [id number]  HPI He is here for consultation concerning an episode of shortness of breath and weakness that occurred last week. He had no chest pain, diaphoresis, nausea, vomiting or productive cough. It lasted for several minutes and then went away. He also apparently had an episode of leg weakness at one time other than that but again lasted very short period of time.   Review of Systems     Objective:   Physical Exam Alert and in no distress. Tympanic membranes and canals are normal. Pharyngeal area is normal. Neck is supple without adenopathy or thyromegaly. Cardiac exam shows a regular sinus rhythm With a grade 2 SEM, no gallops. Lungs are clear to auscultation. Left extremity does show an abrasion over the upper anterior shin area that his wife is caring for. His leg edema has diminished greatly since last visit.       Assessment & Plan:  Leg abrasion, infected, left, initial encounter  Short of breath on exertion I reassured that I did not think he was in any trouble. Recommend continuing on present medications and also continue to treat the leg abrasion conservatively.

## 2016-10-09 ENCOUNTER — Encounter: Payer: Self-pay | Admitting: Family Medicine

## 2016-10-16 ENCOUNTER — Other Ambulatory Visit: Payer: Self-pay | Admitting: Family Medicine

## 2016-10-18 ENCOUNTER — Other Ambulatory Visit: Payer: Self-pay | Admitting: Family Medicine

## 2016-11-06 ENCOUNTER — Inpatient Hospital Stay (HOSPITAL_COMMUNITY)
Admission: AD | Admit: 2016-11-06 | Discharge: 2016-11-09 | DRG: 291 | Disposition: A | Discharge status: Home Health | Payer: Medicare Other | Source: Ambulatory Visit | Attending: Internal Medicine | Admitting: Internal Medicine

## 2016-11-06 ENCOUNTER — Ambulatory Visit (INDEPENDENT_AMBULATORY_CARE_PROVIDER_SITE_OTHER): Payer: Medicare Other | Admitting: Internal Medicine

## 2016-11-06 ENCOUNTER — Encounter (HOSPITAL_COMMUNITY): Payer: Self-pay | Admitting: General Practice

## 2016-11-06 ENCOUNTER — Encounter: Payer: Self-pay | Admitting: Internal Medicine

## 2016-11-06 VITALS — BP 162/90 | HR 82 | Ht 65.0 in | Wt 223.0 lb

## 2016-11-06 DIAGNOSIS — I429 Cardiomyopathy, unspecified: Secondary | ICD-10-CM | POA: Diagnosis not present

## 2016-11-06 DIAGNOSIS — E1169 Type 2 diabetes mellitus with other specified complication: Secondary | ICD-10-CM | POA: Diagnosis not present

## 2016-11-06 DIAGNOSIS — I5043 Acute on chronic combined systolic (congestive) and diastolic (congestive) heart failure: Secondary | ICD-10-CM | POA: Diagnosis present

## 2016-11-06 DIAGNOSIS — I130 Hypertensive heart and chronic kidney disease with heart failure and stage 1 through stage 4 chronic kidney disease, or unspecified chronic kidney disease: Secondary | ICD-10-CM | POA: Diagnosis not present

## 2016-11-06 DIAGNOSIS — I350 Nonrheumatic aortic (valve) stenosis: Secondary | ICD-10-CM | POA: Diagnosis not present

## 2016-11-06 DIAGNOSIS — I152 Hypertension secondary to endocrine disorders: Secondary | ICD-10-CM | POA: Diagnosis not present

## 2016-11-06 DIAGNOSIS — Z6834 Body mass index (BMI) 34.0-34.9, adult: Secondary | ICD-10-CM | POA: Diagnosis not present

## 2016-11-06 DIAGNOSIS — I442 Atrioventricular block, complete: Secondary | ICD-10-CM | POA: Diagnosis not present

## 2016-11-06 DIAGNOSIS — Z950 Presence of cardiac pacemaker: Secondary | ICD-10-CM | POA: Diagnosis not present

## 2016-11-06 DIAGNOSIS — E1122 Type 2 diabetes mellitus with diabetic chronic kidney disease: Secondary | ICD-10-CM | POA: Diagnosis present

## 2016-11-06 DIAGNOSIS — I472 Ventricular tachycardia: Secondary | ICD-10-CM | POA: Diagnosis present

## 2016-11-06 DIAGNOSIS — I700 Atherosclerosis of aorta: Secondary | ICD-10-CM | POA: Diagnosis not present

## 2016-11-06 DIAGNOSIS — N183 Chronic kidney disease, stage 3 unspecified: Secondary | ICD-10-CM | POA: Diagnosis present

## 2016-11-06 DIAGNOSIS — I110 Hypertensive heart disease with heart failure: Secondary | ICD-10-CM | POA: Diagnosis present

## 2016-11-06 DIAGNOSIS — N189 Chronic kidney disease, unspecified: Secondary | ICD-10-CM

## 2016-11-06 DIAGNOSIS — N179 Acute kidney failure, unspecified: Secondary | ICD-10-CM | POA: Diagnosis present

## 2016-11-06 DIAGNOSIS — I5033 Acute on chronic diastolic (congestive) heart failure: Secondary | ICD-10-CM

## 2016-11-06 DIAGNOSIS — I509 Heart failure, unspecified: Secondary | ICD-10-CM | POA: Diagnosis not present

## 2016-11-06 DIAGNOSIS — E669 Obesity, unspecified: Secondary | ICD-10-CM | POA: Diagnosis present

## 2016-11-06 DIAGNOSIS — E0821 Diabetes mellitus due to underlying condition with diabetic nephropathy: Secondary | ICD-10-CM

## 2016-11-06 DIAGNOSIS — I5042 Chronic combined systolic (congestive) and diastolic (congestive) heart failure: Secondary | ICD-10-CM

## 2016-11-06 HISTORY — DX: Type 2 diabetes mellitus without complications: E11.9

## 2016-11-06 HISTORY — DX: Chronic combined systolic (congestive) and diastolic (congestive) heart failure: I50.42

## 2016-11-06 HISTORY — DX: Pure hypercholesterolemia, unspecified: E78.00

## 2016-11-06 HISTORY — DX: Frequency of micturition: R35.0

## 2016-11-06 HISTORY — DX: Basal cell carcinoma of skin, unspecified: C44.91

## 2016-11-06 HISTORY — DX: Unspecified urinary incontinence: R32

## 2016-11-06 LAB — CUP PACEART INCLINIC DEVICE CHECK
Battery Impedance: 277 Ohm
Battery Remaining Longevity: 94 mo
Brady Statistic AP VP Percent: 61 %
Brady Statistic AS VP Percent: 39 %
Implantable Lead Location: 753859
Implantable Lead Model: 5076
Implantable Pulse Generator Implant Date: 20140711
Lead Channel Impedance Value: 578 Ohm
Lead Channel Impedance Value: 758 Ohm
Lead Channel Pacing Threshold Amplitude: 0.5 V
Lead Channel Pacing Threshold Amplitude: 2.25 V
Lead Channel Pacing Threshold Pulse Width: 0.4 ms
Lead Channel Sensing Intrinsic Amplitude: 4 mV
Lead Channel Setting Pacing Amplitude: 2.5 V
Lead Channel Setting Pacing Pulse Width: 0.4 ms
MDC IDC LEAD IMPLANT DT: 20051205
MDC IDC LEAD IMPLANT DT: 20051205
MDC IDC LEAD LOCATION: 753860
MDC IDC MSMT BATTERY VOLTAGE: 2.79 V
MDC IDC MSMT LEADCHNL RA PACING THRESHOLD PULSEWIDTH: 0.76 ms
MDC IDC SESS DTM: 20180404180721
MDC IDC SET LEADCHNL RV PACING AMPLITUDE: 2.5 V
MDC IDC SET LEADCHNL RV SENSING SENSITIVITY: 2.8 mV
MDC IDC STAT BRADY AP VS PERCENT: 0 %
MDC IDC STAT BRADY AS VS PERCENT: 0 %

## 2016-11-06 LAB — COMPREHENSIVE METABOLIC PANEL
ALBUMIN: 3.7 g/dL (ref 3.5–5.0)
ALK PHOS: 58 U/L (ref 38–126)
ALT: 13 U/L — ABNORMAL LOW (ref 17–63)
ANION GAP: 7 (ref 5–15)
AST: 23 U/L (ref 15–41)
BILIRUBIN TOTAL: 0.8 mg/dL (ref 0.3–1.2)
BUN: 23 mg/dL — ABNORMAL HIGH (ref 6–20)
CALCIUM: 9.2 mg/dL (ref 8.9–10.3)
CO2: 31 mmol/L (ref 22–32)
Chloride: 102 mmol/L (ref 101–111)
Creatinine, Ser: 1.84 mg/dL — ABNORMAL HIGH (ref 0.61–1.24)
GFR calc Af Amer: 38 mL/min — ABNORMAL LOW (ref >60)
GFR calc non Af Amer: 32 mL/min — ABNORMAL LOW (ref >60)
GLUCOSE: 154 mg/dL — AB (ref 65–99)
POTASSIUM: 4.6 mmol/L (ref 3.5–5.1)
Sodium: 140 mmol/L (ref 135–145)
TOTAL PROTEIN: 6.7 g/dL (ref 6.5–8.1)

## 2016-11-06 LAB — CBC
HEMATOCRIT: 44.6 % (ref 39.0–52.0)
Hemoglobin: 14.4 g/dL (ref 13.0–17.0)
MCH: 30.3 pg (ref 26.0–34.0)
MCHC: 32.3 g/dL (ref 30.0–36.0)
MCV: 93.7 fL (ref 78.0–100.0)
PLATELETS: 186 10*3/uL (ref 150–400)
RBC: 4.76 MIL/uL (ref 4.22–5.81)
RDW: 13.3 % (ref 11.5–15.5)
WBC: 7.6 10*3/uL (ref 4.0–10.5)

## 2016-11-06 LAB — GLUCOSE, CAPILLARY
Glucose-Capillary: 148 mg/dL — ABNORMAL HIGH (ref 65–99)
Glucose-Capillary: 148 mg/dL — ABNORMAL HIGH (ref 65–99)

## 2016-11-06 LAB — BRAIN NATRIURETIC PEPTIDE: B NATRIURETIC PEPTIDE 5: 425 pg/mL — AB (ref 0.0–100.0)

## 2016-11-06 LAB — TROPONIN I
TROPONIN I: 0.04 ng/mL — AB (ref <0.03)
Troponin I: 0.04 ng/mL (ref <0.03)

## 2016-11-06 LAB — MAGNESIUM: MAGNESIUM: 1.8 mg/dL (ref 1.7–2.4)

## 2016-11-06 LAB — TSH: TSH: 1.716 u[IU]/mL (ref 0.350–4.500)

## 2016-11-06 MED ORDER — LINAGLIPTIN 5 MG PO TABS
5.0000 mg | ORAL_TABLET | Freq: Every day | ORAL | Status: DC
  Administered 2016-11-07 – 2016-11-09 (×3): 5 mg via ORAL
  Filled 2016-11-06 (×4): qty 1

## 2016-11-06 MED ORDER — METFORMIN HCL 500 MG PO TABS
1000.0000 mg | ORAL_TABLET | Freq: Two times a day (BID) | ORAL | Status: DC
  Administered 2016-11-06 – 2016-11-07 (×2): 1000 mg via ORAL
  Filled 2016-11-06 (×3): qty 2

## 2016-11-06 MED ORDER — LABETALOL HCL 200 MG PO TABS: 400.0000 mg | ORAL_TABLET | Freq: Two times a day (BID) | ORAL | Status: DC

## 2016-11-06 MED ORDER — ENSURE ENLIVE PO LIQD: 237.0000 mL | Freq: Two times a day (BID) | ORAL | Status: DC

## 2016-11-06 MED ORDER — PRAVASTATIN SODIUM 40 MG PO TABS
80.0000 mg | ORAL_TABLET | Freq: Every day | ORAL | Status: DC
  Administered 2016-11-07 – 2016-11-09 (×3): 80 mg via ORAL
  Filled 2016-11-06 (×4): qty 2

## 2016-11-06 MED ORDER — HEPARIN SODIUM (PORCINE) 5000 UNIT/ML IJ SOLN
5000.0000 [IU] | Freq: Three times a day (TID) | INTRAMUSCULAR | Status: DC
  Administered 2016-11-07 – 2016-11-09 (×7): 5000 [IU] via SUBCUTANEOUS
  Filled 2016-11-06 (×7): qty 1

## 2016-11-06 MED ORDER — FUROSEMIDE 10 MG/ML IJ SOLN
60.0000 mg | Freq: Two times a day (BID) | INTRAMUSCULAR | Status: DC
  Administered 2016-11-06 – 2016-11-08 (×4): 60 mg via INTRAVENOUS
  Filled 2016-11-06 (×5): qty 6

## 2016-11-06 MED ORDER — HYDRALAZINE HCL 20 MG/ML IJ SOLN
10.0000 mg | Freq: Three times a day (TID) | INTRAMUSCULAR | Status: DC | PRN
  Administered 2016-11-06 – 2016-11-07 (×2): 10 mg via INTRAVENOUS
  Filled 2016-11-06 (×2): qty 1

## 2016-11-06 MED ORDER — SODIUM CHLORIDE 0.9 % IV SOLN: 250.0000 mL | INTRAVENOUS | Status: DC | PRN

## 2016-11-06 MED ORDER — SODIUM CHLORIDE 0.9% FLUSH: 3.0000 mL | INTRAVENOUS | Status: DC | PRN

## 2016-11-06 MED ORDER — POTASSIUM CHLORIDE CRYS ER 20 MEQ PO TBCR
30.0000 meq | EXTENDED_RELEASE_TABLET | Freq: Two times a day (BID) | ORAL | Status: DC
  Administered 2016-11-06 – 2016-11-07 (×3): 30 meq via ORAL
  Filled 2016-11-06 (×3): qty 1

## 2016-11-06 MED ORDER — HEPARIN SODIUM (PORCINE) 5000 UNIT/ML IJ SOLN
5000.0000 [IU] | Freq: Three times a day (TID) | INTRAMUSCULAR | Status: DC
  Administered 2016-11-06: 5000 [IU] via SUBCUTANEOUS
  Filled 2016-11-06: qty 1

## 2016-11-06 MED ORDER — OMEGA-3-ACID ETHYL ESTERS 1 G PO CAPS
2.0000 g | ORAL_CAPSULE | Freq: Two times a day (BID) | ORAL | Status: DC
  Administered 2016-11-06 – 2016-11-09 (×6): 2 g via ORAL
  Filled 2016-11-06 (×6): qty 2

## 2016-11-06 MED ORDER — SITAGLIPTIN PHOS-METFORMIN HCL 50-1000 MG PO TABS: 1.0000 | ORAL_TABLET | Freq: Two times a day (BID) | ORAL | Status: DC

## 2016-11-06 MED ORDER — ONDANSETRON HCL 4 MG/2ML IJ SOLN: 4.0000 mg | Freq: Four times a day (QID) | INTRAMUSCULAR | Status: DC | PRN

## 2016-11-06 MED ORDER — ACETAMINOPHEN 325 MG PO TABS: 650.0000 mg | ORAL_TABLET | ORAL | Status: DC | PRN

## 2016-11-06 MED ORDER — SODIUM CHLORIDE 0.9% FLUSH
3.0000 mL | Freq: Two times a day (BID) | INTRAVENOUS | Status: DC
  Administered 2016-11-06 – 2016-11-09 (×6): 3 mL via INTRAVENOUS

## 2016-11-06 MED ORDER — FUROSEMIDE 40 MG PO TABS: 40.0000 mg | ORAL_TABLET | Freq: Every day | ORAL | 3 refills | Status: DC

## 2016-11-06 MED ORDER — ASPIRIN EC 81 MG PO TBEC
81.0000 mg | DELAYED_RELEASE_TABLET | Freq: Every day | ORAL | Status: DC
  Administered 2016-11-07 – 2016-11-09 (×3): 81 mg via ORAL
  Filled 2016-11-06 (×4): qty 1

## 2016-11-06 NOTE — Patient Instructions (Addendum)
Medication Instructions: - Your physician has recommended you make the following change in your medication:  1) you are being given a prescription for lasix (furosemide) 40 mg once daily- do not start this until we give you the ok.  Labwork: - none ordered  Procedures/Testing: - none ordered  Follow-Up: - You are being Admitting to [facility]- they will need to call you at home with a bed assignment- please call Dr. Olin Pia nurse back at [phone] if you have not heard from the hospital by 4:30 pm today  Any Additional Special Instructions Will Be Listed Below (If Applicable).     If you need a refill on your cardiac medications before your next appointment, please call your pharmacy.

## 2016-11-06 NOTE — Progress Notes (Signed)
Patient Care Team: Denita Lung, MD as PCP - General   HPI  Malik Rojas is a 83 y.o. male seen in followup for pacemaker implantation for high-grade heart block and presyncope.  March 2014 left heart cath showed no significant coronary disease;  ejection fraction by cath was 50-55% as opposed to a noninvasive imaging. This had measured an ejection fraction of 25%. He was treated for heart failure with IV Lasix and BiPAP. Most recent echocardiogram 3/17 EF 35%.    He has had problems with frequent urination and incontinence. He was drinking copious fluids. He is drinking less now.  Over recent months however, he has had progressive shortness of breath and peripheral edema. He typically has asymmetric edema anyway and his rate is much much larger than his left. He has orthopnea and nocturnal dyspnea. He short of breath at less than 20 steps.  He has had a number of episodes wherein he has become acutely short of breath      Past Medical History:  Diagnosis Date  . CAD (coronary artery disease)    LHC 3/14: Distal left main 10%, LAD 20%, circumflex 10-20%, proximal RCA 20%, distal 40-50%, EF 50-55%.  . Cardiac pacemaker Medtronic   . Chronic combined systolic and diastolic heart failure (HCC)    Ejection fraction 55% with elevated end-diastolic pressure-catheterization 3/14  . Complete heart block (HCC)    Status post Medtronic pacemaker  . Diabetes mellitus    Type 2  . Edema   . HTN (hypertension)   . Hx of cardiovascular stress test    a. [place] MV 3/14:  Low risk, EF 36%, apical inf scar, no ischemia, inf-apical HK>> catheterization however demonstrated normal left ventricular function  . Melanoma ([place])   . Obesity   . Pneumonia    hx of at age 89  . Presence of permanent cardiac pacemaker     Past Surgical History:  Procedure Laterality Date  . APPENDECTOMY  40 years ago  . MOHS SURGERY     being monitored-every 38months  . PACEMAKER INSERTION  ~1999   medtronic EnRhythm  . PERMANENT PACEMAKER GENERATOR CHANGE N/A 02/12/2013   Procedure: PERMANENT PACEMAKER GENERATOR CHANGE;  Surgeon: Deboraha Sprang, MD;  Location: [facility] C[facility] LAB;  Service: Cardiovascular;  Laterality: N/A;  . TONSILLECTOMY     as child  . TRANSURETHRAL RESECTION OF PROSTATE N/A 10/13/2014   Procedure: WOLF TRANSURETHRAL RESECTION OF THE PROSTATE (TURP);  Surgeon: Carolan Clines, MD;  Location: [facility] ORS;  Service: Urology;  Laterality: N/A;    Current Outpatient Prescriptions  Medication Sig Dispense Refill  . aspirin 81 MG tablet Take 81 mg by mouth daily.    Marland Kitchen glucose blood test strip 1 each by Other route 2 (two) times daily. Use as instructed THIS IS FOR THE ACCUCHECK DX 250.00 200 each 0  . JANUMET 50-1000 MG tablet take 1 tablet by mouth twice a day 60 tablet 0  . labetalol (NORMODYNE) 200 MG tablet Take 2 tablets (400 mg total) by mouth 2 (two) times daily. 120 tablet 11  . Lancets MISC 1 each by Does not apply route 2 (two) times daily. (THIS IS FOR ACCUCHECK) 200 each 0  . lisinopril (PRINIVIL,ZESTRIL) 10 MG tablet TAKE 1 TABLET BY MOUTH DAILY 90 tablet 3  . Omega-3 Fatty Acids (FISH OIL) 1000 MG CAPS Take 1,000 mg by mouth daily.     . pravastatin (PRAVACHOL) 80 MG tablet take 1 tablet by  mouth once daily 90 tablet 0  . silver sulfADIAZINE (SILVADENE) 1 % cream Apply 1 application topically daily. 50 g 0   No current facility-administered medications for this visit.     No Known Allergies  Review of Systems negative except from HPI and PMH  Physical Exam BP (!) 162/90   Pulse 82   Ht 5\' 5"  (1.651 m)   Wt 223 lb (101.2 kg)   SpO2 96%   BMI 37.11 kg/m  Well developed and well nourished in no acute distress HENT normal E scleral and icterus clear Neck Supple JVP8-10 cm  carotids brisk and full Clear to ausculation  2/6 early systolic murmur  Regular rate and rhythm,  Soft with active bowel sounds No clubbing cyanosis 3+ Edema Alert and oriented,  grossly normal motor and sensory function Skin Warm and Dry  ECG demonstrates AV pacing  Assessment and  Plan  Complete heart block-pacemaker dependent   Pacemaker-Medtronic  The patient's device was interrogated.  The information was reviewed. No changes were made in the programming.    Hypertension   Cardiomyopathy  Acute on chronic congestive heart failure  Ventricular tachycardia-nonsustained  The patient has acute on chronic heart failure and massive volume overload in the setting of unknown LV function.  At this point he is unable to ambulate at his house and hence, will admit for IV diuresis and evaluat5ion   We will leave to evaluate left ventricular function as his chronic RV pacing may resulted in worsening LV function  It is unlikely that he has ischemia.  His hypertension may be aggravating his heart failure also  Will need echo

## 2016-11-06 NOTE — Progress Notes (Signed)
Troponin 0.04 called in by Rashida green from lab. Pt denies chest pain  Notified on call NP, Mancel Bale.  Instructed to keep monitoring.  Karie Kirks, Therapist, sports.

## 2016-11-06 NOTE — H&P (Signed)
See separate note.

## 2016-11-06 NOTE — Progress Notes (Signed)
    Called to verify home medications for patient as there was question about whether he was taking home labetalol 400mg  BID. Patient was unsure, as was his wife. For now will hold, and add PRN hydralazine for blood pressure.   Reino Bellis NP-C

## 2016-11-07 ENCOUNTER — Inpatient Hospital Stay (HOSPITAL_COMMUNITY): Payer: Medicare Other

## 2016-11-07 ENCOUNTER — Encounter (HOSPITAL_COMMUNITY): Payer: Self-pay | Admitting: Physician Assistant

## 2016-11-07 DIAGNOSIS — I509 Heart failure, unspecified: Secondary | ICD-10-CM

## 2016-11-07 DIAGNOSIS — I5043 Acute on chronic combined systolic (congestive) and diastolic (congestive) heart failure: Secondary | ICD-10-CM

## 2016-11-07 LAB — GLUCOSE, CAPILLARY
Glucose-Capillary: 122 mg/dL — ABNORMAL HIGH (ref 65–99)
Glucose-Capillary: 145 mg/dL — ABNORMAL HIGH (ref 65–99)
Glucose-Capillary: 100 mg/dL — ABNORMAL HIGH (ref 65–99)
Glucose-Capillary: 225 mg/dL — ABNORMAL HIGH (ref 65–99)

## 2016-11-07 LAB — BASIC METABOLIC PANEL
Anion gap: 10 (ref 5–15)
BUN: 25 mg/dL — ABNORMAL HIGH (ref 6–20)
CALCIUM: 9.3 mg/dL (ref 8.9–10.3)
CO2: 28 mmol/L (ref 22–32)
Chloride: 102 mmol/L (ref 101–111)
Creatinine, Ser: 1.64 mg/dL — ABNORMAL HIGH (ref 0.61–1.24)
GFR calc Af Amer: 43 mL/min — ABNORMAL LOW (ref >60)
GFR calc non Af Amer: 37 mL/min — ABNORMAL LOW (ref >60)
GLUCOSE: 151 mg/dL — AB (ref 65–99)
POTASSIUM: 4 mmol/L (ref 3.5–5.1)
Sodium: 140 mmol/L (ref 135–145)

## 2016-11-07 LAB — ECHOCARDIOGRAM COMPLETE
HEIGHTINCHES: 65 in
Weight: 3462.4 oz

## 2016-11-07 LAB — URINALYSIS, ROUTINE W REFLEX MICROSCOPIC
Bilirubin Urine: NEGATIVE
GLUCOSE, UA: NEGATIVE mg/dL
HGB URINE DIPSTICK: NEGATIVE
KETONES UR: NEGATIVE mg/dL
LEUKOCYTES UA: NEGATIVE
NITRITE: NEGATIVE
PROTEIN: 30 mg/dL — AB
RBC / HPF: NONE SEEN RBC/hpf (ref 0–5)
Specific Gravity, Urine: 1.008 (ref 1.005–1.030)
Squamous Epithelial / LPF: NONE SEEN
pH: 5 (ref 5.0–8.0)

## 2016-11-07 LAB — TROPONIN I: TROPONIN I: 0.04 ng/mL — AB (ref <0.03)

## 2016-11-07 MED ORDER — ISOSORBIDE MONONITRATE ER 30 MG PO TB24
30.0000 mg | ORAL_TABLET | Freq: Every day | ORAL | Status: DC
  Administered 2016-11-07 – 2016-11-09 (×3): 30 mg via ORAL
  Filled 2016-11-07 (×3): qty 1

## 2016-11-07 MED ORDER — INSULIN ASPART 100 UNIT/ML ~~LOC~~ SOLN
0.0000 [IU] | Freq: Every day | SUBCUTANEOUS | Status: DC
  Administered 2016-11-07: 2 [IU] via SUBCUTANEOUS

## 2016-11-07 MED ORDER — INSULIN ASPART 100 UNIT/ML ~~LOC~~ SOLN
0.0000 [IU] | Freq: Three times a day (TID) | SUBCUTANEOUS | Status: DC
  Administered 2016-11-07: 2 [IU] via SUBCUTANEOUS

## 2016-11-07 MED ORDER — HYDRALAZINE HCL 10 MG PO TABS
10.0000 mg | ORAL_TABLET | Freq: Three times a day (TID) | ORAL | Status: DC
  Administered 2016-11-07 – 2016-11-09 (×6): 10 mg via ORAL
  Filled 2016-11-07 (×6): qty 1

## 2016-11-07 MED ORDER — INSULIN ASPART 100 UNIT/ML ~~LOC~~ SOLN
0.0000 [IU] | Freq: Three times a day (TID) | SUBCUTANEOUS | Status: DC
  Administered 2016-11-08: 2 [IU] via SUBCUTANEOUS
  Administered 2016-11-08 (×2): 3 [IU] via SUBCUTANEOUS
  Administered 2016-11-09: 2 [IU] via SUBCUTANEOUS

## 2016-11-07 MED ORDER — HYDRALAZINE HCL 25 MG PO TABS
25.0000 mg | ORAL_TABLET | Freq: Three times a day (TID) | ORAL | Status: DC
  Filled 2016-11-07: qty 1

## 2016-11-07 MED ORDER — LISINOPRIL 10 MG PO TABS
10.0000 mg | ORAL_TABLET | Freq: Every day | ORAL | Status: DC
  Administered 2016-11-07 – 2016-11-09 (×3): 10 mg via ORAL
  Filled 2016-11-07 (×3): qty 1

## 2016-11-07 NOTE — Progress Notes (Signed)
PHARMACIST - PHYSICIAN COMMUNICATION  CONCERNING:  METFORMIN SAFE ADMINISTRATION POLICY  RECOMMENDATION: Metformin has been placed on DISCONTINUE (rejected order) STATUS and should be reordered only after any of the conditions below are ruled out.  Current Safety recommendations include avoiding metformin for a minimum of 48 hours after the patient's exposure to intravenous contrast media for the following conditions:  . eGFR < 60 ml/min  . Liver disease, alcoholism, heart failure, intra-arterial administration of contrast  DESCRIPTION:  The Pharmacy Committee has adopted a policy that restricts the use of metformin in hospitalized patients until all the contraindications to administration have been ruled out. Specific contraindications are: _0  Serum creatinine ? 1.5 for males _1  Serum creatinine ? 1.4 for females _2  Shock, acute MI, sepsis, hypoxemia, dehydration _3  Planned administration of intravenous iodinated contrast media when eGFR < 46m/min, Liver Disease, alcoholism, heart failure or intra-arterial administration of contrast _4  Heart Failure patients with low EF _5  Acute or chronic metabolic acidosis (including DKA)   PTad Moore [facility]4/12/2016 9:52 AM

## 2016-11-07 NOTE — Progress Notes (Signed)
Progress Note  Patient Name: Malik Rojas Date of Encounter: 11/07/2016  Primary Cardiologist: Dr Caryl Comes  Patient Profile     83 y.o. male w/ hx S-D-CHF, EF 35% 10/2015 echo, heart block s/p MDT PPM, NSVT, HTN, NICM w/ no CAD at cath 2014. Seen 04/04 as add-on in the office and admitted for acute on chronic CHF  Subjective   Pt oriented to name and place, does not remember seeing Dr Caryl Comes yesterday, has trouble taking the lid off a glass.   Pt denies SOB at rest, is not on O2. Wife is present. Pt still drives.  Inpatient Medications    Scheduled Meds: . aspirin EC  81 mg Oral Daily  . feeding supplement (ENSURE ENLIVE)  237 mL Oral BID BM  . furosemide  60 mg Intravenous BID  . heparin  5,000 Units Subcutaneous Q8H  . linagliptin  5 mg Oral Daily  . metFORMIN  1,000 mg Oral BID WC  . omega-3 acid ethyl esters  2 g Oral BID  . potassium chloride  30 mEq Oral BID  . pravastatin  80 mg Oral Daily  . sodium chloride flush  3 mL Intravenous Q12H   Continuous Infusions:  PRN Meds: sodium chloride, acetaminophen, hydrALAZINE, ondansetron (ZOFRAN) IV, sodium chloride flush   Vital Signs    Vitals:   11/06/16 1607 11/06/16 2026 11/07/16 0538  BP: (!) 193/82 (!) 183/77 (!) 171/64  Pulse: 91 80 73  Resp: 18 18 18   Temp: 98 F (36.7 C) 97.9 F (36.6 C) 97.6 F (36.4 C)  TempSrc: Oral Oral Oral  SpO2: 95% 95% 95%  Weight: 216 lb 4.8 oz (98.1 kg)  216 lb 6.4 oz (98.2 kg)  Height: 5\' 5"  (1.651 m)      Intake/Output Summary (Last 24 hours) at 11/07/16 0800 Last data filed at 11/07/16 0539  Gross per 24 hour  Intake              240 ml  Output             1000 ml  Net             -760 ml   Filed Weights   11/06/16 1607 11/07/16 0538  Weight: 216 lb 4.8 oz (98.1 kg) 216 lb 6.4 oz (98.2 kg)    Telemetry    SR, V pacing, rare PVCs - Personally Reviewed  ECG    n/a - Personally Reviewed  Physical Exam   General: Well developed, well nourished, male  appearing in no acute distress. Head: Normocephalic, atraumatic.  Neck: Supple without bruits, JVD mildly elevated. Lungs:  Resp regular and unlabored, few rales Heart: RRR , S1, S2, no S3, S4, 2/6 murmur; no rub. Abdomen: Soft, non-tender, non-distended with normoactive bowel sounds. No hepatomegaly. No rebound/guarding. No obvious abdominal masses. Extremities: No clubbing, cyanosis, trace R>L edema. Distal pedal pulses are 2+ bilaterally. Neuro: Alert and oriented X 3. Moves all extremities spontaneously. Psych: Normal affect.  Labs    Hematology  Recent Labs Lab 11/06/16 1656  WBC 7.6  RBC 4.76  HGB 14.4  HCT 44.6  MCV 93.7  MCH 30.3  MCHC 32.3  RDW 13.3  PLT 186    Chemistry  Recent Labs Lab 11/06/16 1656 11/07/16 0422  NA 140 140  K 4.6 4.0  CL 102 102  CO2 31 28  GLUCOSE 154* 151*  BUN 23* 25*  CREATININE 1.84* 1.64*  CALCIUM 9.2 9.3  PROT 6.7  --  ALBUMIN 3.7  --   AST 23  --   ALT 13*  --   ALKPHOS 58  --   BILITOT 0.8  --   GFRNONAA 32* 37*  GFRAA 38* 43*  ANIONGAP 7 10     Cardiac Enzymes  Recent Labs Lab 11/06/16 1656 11/06/16 2213 11/07/16 0422  TROPONINI 0.04* 0.04* 0.04*   BNP  Recent Labs Lab 11/06/16 1656  BNP 425.0*    Urinalysis    Component Value Date/Time   COLORURINE YELLOW 01/08/2013 2045   APPEARANCEUR HAZY (A) 01/08/2013 2045   LABSPEC 1.014 01/08/2013 2045   PHURINE 7.0 01/08/2013 2045   GLUCOSEU 100 (A) 01/08/2013 2045   HGBUR MODERATE (A) 01/08/2013 2045   BILIRUBINUR NEGATIVE 01/08/2013 2045   KETONESUR NEGATIVE 01/08/2013 2045   PROTEINUR >300 (A) 01/08/2013 2045   UROBILINOGEN 0.2 01/08/2013 2045   NITRITE NEGATIVE 01/08/2013 2045   LEUKOCYTESUR NEGATIVE 01/08/2013 2045      Radiology    Dg Chest 2 View Result Date: 11/07/2016 CLINICAL DATA:  CHF EXAM: CHEST  2 VIEW COMPARISON:  01/08/2013 chest radiograph. FINDINGS: Two lead left subclavian pacemaker is noted with lead tips overlying the right  atrium and right ventricle. Stable cardiomediastinal silhouette with cardiomegaly and aortic atherosclerosis. No pneumothorax. No pleural effusion. Borderline mild pulmonary edema. No consolidative airspace disease. IMPRESSION: Borderline mild congestive heart failure. Aortic atherosclerosis. Electronically Signed   By: Ilona Sorrel M.D.   On: 11/07/2016 07:54     Cardiac Studies   ECHO: 11/07/2016 ordered  Patient Profile     83 y.o. male w/ hx  CHF, EF 35% 10/2015 echo, heart block s/p MDT PPM, NSVT, HTN, NICM w/ no CAD at cath 2014. Seen 04/04 as add-on in the office and admitted for acute on chronic CHF  Assessment & Plan    Principal Problem:   Acute on chronic combined systolic and diastolic CHF, NYHA class 3 (HCC) - Pt has been losing weight at home, dry weight unclear - pt has been ambulating much less, wife states he is shuffling his feet - also w/ continence problems since TURP, so output may be unclear - continue IV Lasix today, possible change to po in am - echo ordered    General medical - shuffling gait, memory problems, ?stiffness in movements - pt has also had 2 episodes of nausea, one at home by himself and one at church, both within the last week. - Pt expressed that he felt useless "I'm no good to anybody" recently - will ck UA, no fever and WBC wnl. - PT eval, may need Neurology to see - f/u with PCP for possible depression  Active Problems:   Hypertension associated with diabetes ([place]) - BP very high today - add hydralazine 25 mg tid, Imdur 30 - lisinopril held on admission due to diuresis and renal insufficiency - unclear if he is supposed to be on labetalol - he is pacer dependent at baseline, discuss Coreg with MD    Diabetes mellitus due to underlying condition with diabetic nephropathy, without long-term current use of insulin ([place]) - add SSI - monitor renal function, is on home dose of metformin    Chronic kidney disease, stage III (moderate) -  follow    CHF (congestive heart failure) ([place])    Signed, Barrett, Rhonda , PA-C 8:00 AM 11/07/2016 Pager: [phone] As above, patient seen and examined. He does not like diuresis because of frequent urination. However he continues to have dyspnea and lower extremity  edema. No chest pain. We will continue with present dose of Lasix. Would resume lisinopril 10 mg daily. Hydralazine nitrates added for afterload reduction and blood pressure. Await echocardiogram. If LV function diminished would add beta blocker as CHF improves. Follow renal function.  Kirk Ruths, MD

## 2016-11-07 NOTE — Progress Notes (Signed)
  Echocardiogram 2D Echocardiogram has been performed. Technically difficult views due to patient inability to stay in correct position.   Malik Rojas 11/07/2016, 3:21 PM

## 2016-11-07 NOTE — Progress Notes (Signed)
Pt's SBP in 170's this am .  Gave PRN hydralazine iv of 10mg  .  Now down 155/66.  New order of hydralazine of 25mg  this am.  Notified R. Barrett of above and instructed  And also just than am BP meds.  Instructedt to wait a little bit and give hydralazine po.  Will continue to monitor.  Karie Kirks, Therapist, sports.

## 2016-11-07 NOTE — Care Management Note (Signed)
Case Management Note  Patient Details  Name: Malik Rojas MRN: [id number] Date of Birth: 1932-06-19  Subjective/Objective:       Admitted with CHF            Action/Plan: Patient lives at home with his spouse; PCP Denita Lung, MD; has private insurance with [facility]; CM following for DCP  Expected Discharge Date:     Possibly 11/11/2016             Expected Discharge Plan:  [place]   Discharge planning Services  CM Consult  Status of Service:  In process, will continue to follow  Sherrilyn Rist [phone] 11/07/2016, 10:53 AM

## 2016-11-07 NOTE — Progress Notes (Signed)
Nutrition Education Note  RD consulted for nutrition education regarding new onset CHF.  RD provided "Low Sodium Nutrition Therapy" handout from the Academy of [employer]. Reviewed patient's dietary recall. Provided examples on ways to decrease sodium intake in diet. Discouraged intake of processed foods and use of salt shaker. Encouraged fresh fruits and vegetables as well as whole grain sources of carbohydrates to maximize fiber intake.   RD discussed why it is important for patient to adhere to diet recommendations, and emphasized the role of fluids, foods to avoid, and importance of weighing self daily. Teach back method used.  Expect fair compliance.  Body mass index is 36.01 kg/m. Pt meets criteria for obese based on current BMI.  Current diet order is carbohydrate controlled, patient is consuming approximately 100% of meals at this time. Labs and medications reviewed. No further nutrition interventions warranted at this time. RD contact information provided. If additional nutrition issues arise, please re-consult RD.   Malik Rojas, RD, LDN Pager #- [phone]

## 2016-11-07 NOTE — Progress Notes (Signed)
Physical Therapy Evaluation & Discharge Patient Details Name: Malik Rojas MRN: [id number] DOB: 12/27/1931 Today's Date: 11/07/2016   History of Present Illness  83 y.o. male w/ hx S-D-CHF, EF 35%, heart block s/p MDT PPM, NSVT, HTN, NICM w/ no CAD at cath 2014. Seen 04/04 as add-on in the office and admitted for acute on chronic CHF  Clinical Impression  Patient seen with spouse present.  Spouse reports they work together frequently when patient needs a little help (getting out of bed).  MIN assist for bed mobility and initial transfer, after initial movement patient did not require physical assist, but movements were slow.  Patient reports improved strength and stamina, maintained O2 saturation with activity and only mild/mod fatigue with gait.  Patient presents with some impairment, but anticipated to resolve with time and medical management of CHF.  Completed education for activity dosing and increasing activity at graded rate with spouse to assist initially.  Patient without additional skilled PT need noted, will sign off, please re-order if status changes.    Follow Up Recommendations No PT follow up;Supervision - Intermittent    Equipment Recommendations  None recommended by PT    Recommendations for Other Services       Precautions / Restrictions Restrictions Weight Bearing Restrictions: No      Mobility  Bed Mobility Overal bed mobility: Needs Assistance Bed Mobility: Supine to Sit     Supine to sit: Min assist     General bed mobility comments: Assist from spouse, who stated that was not unusual  Transfers Overall transfer level: Needs assistance Equipment used: None Transfers: Sit to/from [employer] Transfers Sit to Stand: Min guard Stand pivot transfers: Min guard;Supervision       General transfer comment: Assist from spouse on initial stand, (not unusual), No physical assist needed on subsequent trials.  Ambulation/Gait Ambulation/Gait  assistance: Supervision Ambulation Distance (Feet): 100 Feet Assistive device: None Gait Pattern/deviations: Step-through pattern;Decreased stride length;Wide base of support Gait velocity: slow Gait velocity interpretation: Below normal speed for age/gender    Stairs            Wheelchair Mobility    Modified Rankin (Stroke Patients Only)       Balance Overall balance assessment: No apparent balance deficits (not formally assessed)                                           Pertinent Vitals/Pain Pain Assessment: No/denies pain    Home Living Family/patient expects to be discharged to:: Private residence Living Arrangements: Spouse/significant other Available Help at Discharge: Family;Available 24 hours/day Type of Home: House Home Access: Stairs to enter   [employer] of Steps: 2 (to side steps, 5 at front door) Home Layout: One level Home Equipment: Cane - single point Additional Comments: Spouse is able to assist, has been helping at times with ADL's    Prior Function Level of Independence: Independent         Comments: has cane, used it past few days before admission due to SOb     Hand Dominance        Extremity/Trunk Assessment   Upper Extremity Assessment Upper Extremity Assessment: Overall WFL for tasks assessed    Lower Extremity Assessment Lower Extremity Assessment: Overall WFL for tasks assessed    Cervical / Trunk Assessment Cervical / Trunk Assessment: Normal  Communication   Communication: No  difficulties  Cognition Arousal/Alertness: Awake/alert Behavior During Therapy: WFL for tasks assessed/performed;Flat affect Overall Cognitive Status: Within Functional Limits for tasks assessed                                        General Comments General comments (skin integrity, edema, etc.): LE edema, discoloration and scaling of chronic nature, R > L.    Exercises     Assessment/Plan     PT Assessment Patent does not need any further PT services  PT Problem List         PT Treatment Interventions      PT Goals (Current goals can be found in the Care Plan section)  Acute Rehab PT Goals Patient Stated Goal: Get better PT Goal Formulation: All assessment and education complete, DC therapy    Frequency     Barriers to discharge        Co-evaluation               End of Session Equipment Utilized During Treatment: Gait belt Activity Tolerance: Patient tolerated treatment well Patient left: in chair;with family/visitor present;with nursing/sitter in room Nurse Communication: Mobility status PT Visit Diagnosis: Muscle weakness (generalized) (M62.81)    Time: 1600-1630 PT Time Calculation (min) (ACUTE ONLY): 30 min   Charges:   PT Evaluation $PT Eval Low Complexity: 1 Procedure PT Treatments $Therapeutic Activity: 8-22 mins   PT G Codes:        Judith Blonder, DPT  Zenia Resides, Shelisha Gautier L 11/07/2016, 4:55 PM

## 2016-11-07 NOTE — Progress Notes (Signed)
Nutrition Brief Note  Patient identified on the Malnutrition Screening Tool (MST) Report  Wt Readings from Last 15 Encounters:  11/07/16 216 lb 6.4 oz (98.2 kg)  11/06/16 223 lb (101.2 kg)  10/01/16 218 lb (98.9 kg)  07/23/16 225 lb (102.1 kg)  03/20/16 223 lb 12.8 oz (101.5 kg)  10/12/15 228 lb 3.2 oz (103.5 kg)  09/25/15 228 lb 6.4 oz (103.6 kg)  08/02/15 223 lb 6.4 oz (101.3 kg)  03/23/15 212 lb (96.2 kg)  02/02/15 220 lb (99.8 kg)  01/12/15 223 lb (101.2 kg)  12/29/14 225 lb (102.1 kg)  11/21/14 226 lb (102.5 kg)  10/13/14 225 lb (102.1 kg)  10/10/14 225 lb (102.1 kg)   Met with pt in room today. Pt reports good appetite pta and currently. Per chart, pt is weight stable. Pt was provided education today. Pt does not like supplements.  Body mass index is 36.01 kg/m. Patient meets criteria for obese based on current BMI.   Current diet order is carbohydrate controlled, patient is consuming approximately 100% of meals at this time. Labs and medications reviewed.   No nutrition interventions warranted at this time. If nutrition issues arise, please consult RD.   Koleen Distance, RD, LDN Pager #- [phone]

## 2016-11-07 NOTE — Progress Notes (Signed)
Pt refused bed and chair alarm.  Instructed to call for assistance as needed.  Verbalized understanding.  Wife at bedside.  Karie Kirks, Therapist, sports.

## 2016-11-07 NOTE — Progress Notes (Signed)
Hydralazine 25mg  held.  As instructed by R. Barrett, PA.  Noted BP down 107/69.  Karie Kirks, Therapist, sports.

## 2016-11-08 ENCOUNTER — Other Ambulatory Visit (HOSPITAL_COMMUNITY): Payer: Medicare Other

## 2016-11-08 LAB — GLUCOSE, CAPILLARY
GLUCOSE-CAPILLARY: 140 mg/dL — AB (ref 65–99)
GLUCOSE-CAPILLARY: 180 mg/dL — AB (ref 65–99)
Glucose-Capillary: 176 mg/dL — ABNORMAL HIGH (ref 65–99)
Glucose-Capillary: 169 mg/dL — ABNORMAL HIGH (ref 65–99)

## 2016-11-08 LAB — BASIC METABOLIC PANEL
Anion gap: 11 (ref 5–15)
BUN: 34 mg/dL — AB (ref 6–20)
CO2: 29 mmol/L (ref 22–32)
CREATININE: 2.15 mg/dL — AB (ref 0.61–1.24)
Calcium: 9.1 mg/dL (ref 8.9–10.3)
Chloride: 98 mmol/L — ABNORMAL LOW (ref 101–111)
GFR calc Af Amer: 31 mL/min — ABNORMAL LOW (ref >60)
GFR calc non Af Amer: 27 mL/min — ABNORMAL LOW (ref >60)
Glucose, Bld: 151 mg/dL — ABNORMAL HIGH (ref 65–99)
Potassium: 4.8 mmol/L (ref 3.5–5.1)
SODIUM: 138 mmol/L (ref 135–145)

## 2016-11-08 MED ORDER — CARVEDILOL 3.125 MG PO TABS
3.1250 mg | ORAL_TABLET | Freq: Two times a day (BID) | ORAL | Status: DC
  Administered 2016-11-08: 3.125 mg via ORAL
  Filled 2016-11-08: qty 1

## 2016-11-08 MED ORDER — FUROSEMIDE 40 MG PO TABS: 40.0000 mg | ORAL_TABLET | Freq: Every day | ORAL | Status: DC

## 2016-11-08 MED ORDER — POTASSIUM CHLORIDE CRYS ER 20 MEQ PO TBCR
20.0000 meq | EXTENDED_RELEASE_TABLET | Freq: Every day | ORAL | Status: DC
  Administered 2016-11-08 – 2016-11-09 (×2): 20 meq via ORAL
  Filled 2016-11-08 (×2): qty 1

## 2016-11-08 NOTE — Progress Notes (Signed)
Progress Note  Patient Name: Malik Rojas Date of Encounter: 11/08/2016  Primary Cardiologist: Dr Caryl Comes  Patient Profile     83 y.o. male w/ hx S-D-CHF, EF 35% 10/2015 echo, heart block s/p MDT PPM, NSVT, HTN, NICM w/ no CAD at cath 2014. Seen 04/04 as add-on in the office and admitted for acute on chronic CHF  Subjective   Pt states dyspnea is improving; no chest pain  Inpatient Medications    Scheduled Meds: . aspirin EC  81 mg Oral Daily  . feeding supplement (ENSURE ENLIVE)  237 mL Oral BID BM  . furosemide  60 mg Intravenous BID  . heparin  5,000 Units Subcutaneous Q8H  . hydrALAZINE  10 mg Oral Q8H  . insulin aspart  0-15 Units Subcutaneous TID WC  . insulin aspart  0-5 Units Subcutaneous QHS  . isosorbide mononitrate  30 mg Oral Daily  . linagliptin  5 mg Oral Daily  . lisinopril  10 mg Oral Daily  . omega-3 acid ethyl esters  2 g Oral BID  . potassium chloride  30 mEq Oral BID  . pravastatin  80 mg Oral Daily  . sodium chloride flush  3 mL Intravenous Q12H   Continuous Infusions:  PRN Meds: sodium chloride, acetaminophen, hydrALAZINE, ondansetron (ZOFRAN) IV, sodium chloride flush   Vital Signs    Vitals:   11/07/16 2103 11/08/16 0036 11/08/16 0300 11/08/16 0451  BP: (!) 142/68 (!) 139/57  (!) 146/70  Pulse: 78 94  66  Resp: 17 18  16   Temp: 97.9 F (36.6 C) 98.2 F (36.8 C)  98.2 F (36.8 C)  TempSrc: Oral Oral  Oral  SpO2: 93% 95%  94%  Weight:   210 lb 6.4 oz (95.4 kg)   Height:        Intake/Output Summary (Last 24 hours) at 11/08/16 0842 Last data filed at 11/08/16 0500  Gross per 24 hour  Intake             1060 ml  Output              725 ml  Net              335 ml   Filed Weights   11/06/16 1607 11/07/16 0538 11/08/16 0300  Weight: 216 lb 4.8 oz (98.1 kg) 216 lb 6.4 oz (98.2 kg) 210 lb 6.4 oz (95.4 kg)    Telemetry    SR, V pacing- Personally Reviewed    Physical Exam   General: Well developed, obese male appearing in  no acute distress. Head: Normal Neck: Supple Lungs:  CTA Heart: RRR Abdomen: Soft, non-tender, non-distended Extremities: Chronic skin changes; trace edema Neuro: Alert and oriented X 3. Moves all extremities spontaneously. Psych: Normal affect.  Labs    Hematology  Recent Labs Lab 11/06/16 1656  WBC 7.6  RBC 4.76  HGB 14.4  HCT 44.6  MCV 93.7  MCH 30.3  MCHC 32.3  RDW 13.3  PLT 186    Chemistry  Recent Labs Lab 11/06/16 1656 11/07/16 0422 11/08/16 0439  NA 140 140 138  K 4.6 4.0 4.8  CL 102 102 98*  CO2 31 28 29   GLUCOSE 154* 151* 151*  BUN 23* 25* 34*  CREATININE 1.84* 1.64* 2.15*  CALCIUM 9.2 9.3 9.1  PROT 6.7  --   --   ALBUMIN 3.7  --   --   AST 23  --   --   ALT 13*  --   --  ALKPHOS 58  --   --   BILITOT 0.8  --   --   GFRNONAA 32* 37* 27*  GFRAA 38* 43* 31*  ANIONGAP 7 10 11      Cardiac Enzymes  Recent Labs Lab 11/06/16 1656 11/06/16 2213 11/07/16 0422  TROPONINI 0.04* 0.04* 0.04*   BNP  Recent Labs Lab 11/06/16 1656  BNP 425.0*    Urinalysis    Component Value Date/Time   COLORURINE STRAW (A) 11/07/2016 2059   APPEARANCEUR CLEAR 11/07/2016 2059   LABSPEC 1.008 11/07/2016 2059   PHURINE 5.0 11/07/2016 2059   GLUCOSEU NEGATIVE 11/07/2016 2059   HGBUR NEGATIVE 11/07/2016 2059   BILIRUBINUR NEGATIVE 11/07/2016 2059   [place] NEGATIVE 11/07/2016 2059   PROTEINUR 30 (A) 11/07/2016 2059   UROBILINOGEN 0.2 01/08/2013 2045   NITRITE NEGATIVE 11/07/2016 2059   LEUKOCYTESUR NEGATIVE 11/07/2016 2059      Radiology    Dg Chest 2 View Result Date: 11/07/2016 CLINICAL DATA:  CHF EXAM: CHEST  2 VIEW COMPARISON:  01/08/2013 chest radiograph. FINDINGS: Two lead left subclavian pacemaker is noted with lead tips overlying the right atrium and right ventricle. Stable cardiomediastinal silhouette with cardiomegaly and aortic atherosclerosis. No pneumothorax. No pleural effusion. Borderline mild pulmonary edema. No consolidative airspace  disease. IMPRESSION: Borderline mild congestive heart failure. Aortic atherosclerosis. Electronically Signed   By: Ilona Sorrel M.D.   On: 11/07/2016 07:54     Cardiac Studies   ECHO: 11/08/2016 ordered  Patient Profile     83 y.o. male w/ hx  CHF (acute on chronic combined systolic and diastolic), heart block s/p MDT PPM, NSVT, HTN, NICM w/ no CAD at cath 2014, chronic stage 3 kidney disease. Seen 04/04 as add-on in the office and admitted for acute on chronic CHF  Assessment & Plan    Principal Problem:   Acute on chronic combined systolic and diastolic CHF, NYHA class 3 (HCC) - Pt improving; change lasix to 40 mg po daily; follow renal function    Chronic kidney disease, stage III (moderate) - follow renal function with diuresis     Hypertension  - BP mildly elevated; medications adjusted yesterday; will follow and increase as needed.     Mild AS     NICM - continue ACEI; add coreg 3.125 mg BID and increase as tolerated    S/P PM   - FU Dr Caryl Comes  Possible DC in AM if stable  Signed, Kirk Ruths , MD 8:42 AM 11/08/2016

## 2016-11-08 NOTE — Progress Notes (Signed)
Noted lasix 40mg  po scheduled for this am and pt had received lasix 60mg  iv this.  Notified L. Ingold, NP and instructed not to give lasix 40mg  po .  Karie Kirks, RN.

## 2016-11-08 NOTE — Progress Notes (Signed)
Pt refused bed and chair alarm.  Instructed to call for assist.  Verbalized understanding.  Karie Kirks, Therapist, sports.

## 2016-11-09 ENCOUNTER — Encounter (HOSPITAL_COMMUNITY): Payer: Self-pay | Admitting: Physician Assistant

## 2016-11-09 ENCOUNTER — Other Ambulatory Visit (HOSPITAL_COMMUNITY): Payer: Medicare Other

## 2016-11-09 ENCOUNTER — Other Ambulatory Visit: Payer: Self-pay | Admitting: Physician Assistant

## 2016-11-09 DIAGNOSIS — N179 Acute kidney failure, unspecified: Secondary | ICD-10-CM

## 2016-11-09 DIAGNOSIS — N183 Chronic kidney disease, stage 3 unspecified: Secondary | ICD-10-CM

## 2016-11-09 DIAGNOSIS — I5042 Chronic combined systolic (congestive) and diastolic (congestive) heart failure: Secondary | ICD-10-CM

## 2016-11-09 DIAGNOSIS — N189 Chronic kidney disease, unspecified: Secondary | ICD-10-CM

## 2016-11-09 LAB — BASIC METABOLIC PANEL
ANION GAP: 12 (ref 5–15)
BUN: 38 mg/dL — ABNORMAL HIGH (ref 6–20)
CHLORIDE: 100 mmol/L — AB (ref 101–111)
CO2: 28 mmol/L (ref 22–32)
Calcium: 8.9 mg/dL (ref 8.9–10.3)
Creatinine, Ser: 1.92 mg/dL — ABNORMAL HIGH (ref 0.61–1.24)
GFR calc Af Amer: 36 mL/min — ABNORMAL LOW (ref >60)
GFR, EST NON AFRICAN AMERICAN: 31 mL/min — AB (ref >60)
GLUCOSE: 130 mg/dL — AB (ref 65–99)
POTASSIUM: 3.9 mmol/L (ref 3.5–5.1)
SODIUM: 140 mmol/L (ref 135–145)

## 2016-11-09 LAB — GLUCOSE, CAPILLARY: GLUCOSE-CAPILLARY: 131 mg/dL — AB (ref 65–99)

## 2016-11-09 MED ORDER — CARVEDILOL 6.25 MG PO TABS: 6.2500 mg | ORAL_TABLET | Freq: Two times a day (BID) | ORAL | 11 refills | Status: DC

## 2016-11-09 MED ORDER — FUROSEMIDE 20 MG PO TABS
20.0000 mg | ORAL_TABLET | Freq: Every day | ORAL | Status: DC
  Administered 2016-11-09: 20 mg via ORAL
  Filled 2016-11-09: qty 1

## 2016-11-09 MED ORDER — CARVEDILOL 6.25 MG PO TABS: 6.2500 mg | ORAL_TABLET | Freq: Two times a day (BID) | ORAL | Status: DC

## 2016-11-09 MED ORDER — FUROSEMIDE 20 MG PO TABS: 20.0000 mg | ORAL_TABLET | Freq: Every day | ORAL | 11 refills | Status: DC

## 2016-11-09 MED ORDER — POTASSIUM CHLORIDE CRYS ER 20 MEQ PO TBCR: 10.0000 meq | EXTENDED_RELEASE_TABLET | Freq: Every day | ORAL | 6 refills | Status: DC

## 2016-11-09 MED ORDER — ISOSORBIDE MONONITRATE ER 30 MG PO TB24: 30.0000 mg | ORAL_TABLET | Freq: Every day | ORAL | 11 refills | Status: DC

## 2016-11-09 MED ORDER — LINAGLIPTIN 5 MG PO TABS: 5.0000 mg | ORAL_TABLET | Freq: Every day | ORAL | 1 refills | Status: DC

## 2016-11-09 NOTE — Progress Notes (Signed)
Progress Note  Patient Name: Malik Rojas Date of Encounter: 11/09/2016  Primary Cardiologist: Dr Caryl Comes  Patient Profile     83 y.o. male w/ hx S-D-CHF, EF 35% 10/2015 echo, heart block s/p MDT PPM, NSVT, HTN, NICM w/ no CAD at cath 2014. Seen 04/04 as add-on in the office and admitted for acute on chronic CHF  Subjective   Pt denies dyspnea or chest pain  Inpatient Medications    Scheduled Meds: . aspirin EC  81 mg Oral Daily  . carvedilol  3.125 mg Oral BID WC  . feeding supplement (ENSURE ENLIVE)  237 mL Oral BID BM  . furosemide  40 mg Oral Daily  . heparin  5,000 Units Subcutaneous Q8H  . hydrALAZINE  10 mg Oral Q8H  . insulin aspart  0-15 Units Subcutaneous TID WC  . insulin aspart  0-5 Units Subcutaneous QHS  . isosorbide mononitrate  30 mg Oral Daily  . linagliptin  5 mg Oral Daily  . lisinopril  10 mg Oral Daily  . omega-3 acid ethyl esters  2 g Oral BID  . potassium chloride  20 mEq Oral Daily  . pravastatin  80 mg Oral Daily  . sodium chloride flush  3 mL Intravenous Q12H   Continuous Infusions:  PRN Meds: sodium chloride, acetaminophen, hydrALAZINE, ondansetron (ZOFRAN) IV, sodium chloride flush   Vital Signs    Vitals:   11/08/16 1200 11/08/16 1558 11/08/16 2059 11/09/16 0534  BP: (!) 155/67 113/64 (!) 142/53 (!) 152/70  Pulse: 93 87 60 75  Resp: 18  18 18   Temp: 97.8 F (36.6 C)  97.9 F (36.6 C) 98.4 F (36.9 C)  TempSrc: Oral  Oral Oral  SpO2: 93%  95% 93%  Weight:    209 lb 11.2 oz (95.1 kg)  Height:        Intake/Output Summary (Last 24 hours) at 11/09/16 0711 Last data filed at 11/09/16 0600  Gross per 24 hour  Intake              683 ml  Output              725 ml  Net              -42 ml   Filed Weights   11/07/16 0538 11/08/16 0300 11/09/16 0534  Weight: 216 lb 6.4 oz (98.2 kg) 210 lb 6.4 oz (95.4 kg) 209 lb 11.2 oz (95.1 kg)    Telemetry    SR, V pacing- Personally Reviewed    Physical Exam   General: Well  developed, obese male appearing in no acute distress. Head: Normal Neck: Supple Lungs:  CTA Heart: RRR Abdomen: Soft, non-tender, non-distended Extremities: Chronic skin changes; no edema Neuro: Alert and oriented X 3. Moves all extremities spontaneously. Psych: Normal affect.  Labs    Hematology  Recent Labs Lab 11/06/16 1656  WBC 7.6  RBC 4.76  HGB 14.4  HCT 44.6  MCV 93.7  MCH 30.3  MCHC 32.3  RDW 13.3  PLT 186    Chemistry  Recent Labs Lab 11/06/16 1656 11/07/16 0422 11/08/16 0439 11/09/16 0337  NA 140 140 138 140  K 4.6 4.0 4.8 3.9  CL 102 102 98* 100*  CO2 31 28 29 28   GLUCOSE 154* 151* 151* 130*  BUN 23* 25* 34* 38*  CREATININE 1.84* 1.64* 2.15* 1.92*  CALCIUM 9.2 9.3 9.1 8.9  PROT 6.7  --   --   --   ALBUMIN  3.7  --   --   --   AST 23  --   --   --   ALT 13*  --   --   --   ALKPHOS 58  --   --   --   BILITOT 0.8  --   --   --   GFRNONAA 32* 37* 27* 31*  GFRAA 38* 43* 31* 36*  ANIONGAP 7 10 11 12      Cardiac Enzymes  Recent Labs Lab 11/06/16 1656 11/06/16 2213 11/07/16 0422  TROPONINI 0.04* 0.04* 0.04*   BNP  Recent Labs Lab 11/06/16 1656  BNP 425.0*    Urinalysis    Component Value Date/Time   COLORURINE STRAW (A) 11/07/2016 2059   APPEARANCEUR CLEAR 11/07/2016 2059   LABSPEC 1.008 11/07/2016 2059   PHURINE 5.0 11/07/2016 2059   GLUCOSEU NEGATIVE 11/07/2016 2059   HGBUR NEGATIVE 11/07/2016 2059   BILIRUBINUR NEGATIVE 11/07/2016 2059   [place] NEGATIVE 11/07/2016 2059   PROTEINUR 30 (A) 11/07/2016 2059   UROBILINOGEN 0.2 01/08/2013 2045   NITRITE NEGATIVE 11/07/2016 2059   LEUKOCYTESUR NEGATIVE 11/07/2016 2059      Radiology    Dg Chest 2 View Result Date: 11/07/2016 CLINICAL DATA:  CHF EXAM: CHEST  2 VIEW COMPARISON:  01/08/2013 chest radiograph. FINDINGS: Two lead left subclavian pacemaker is noted with lead tips overlying the right atrium and right ventricle. Stable cardiomediastinal silhouette with cardiomegaly  and aortic atherosclerosis. No pneumothorax. No pleural effusion. Borderline mild pulmonary edema. No consolidative airspace disease. IMPRESSION: Borderline mild congestive heart failure. Aortic atherosclerosis. Electronically Signed   By: Ilona Sorrel M.D.   On: 11/07/2016 07:54    Patient Profile     83 y.o. male w/ hx  CHF (acute on chronic combined systolic and diastolic), heart block s/p MDT PPM, NSVT, HTN, NICM w/ no CAD at cath 2014, chronic stage 3 kidney disease. Seen 04/04 as add-on in the office and admitted for acute on chronic CHF  Assessment & Plan    Principal Problem:   Acute on chronic combined systolic and diastolic CHF, NYHA class 3 (HCC) - Pt improved; decrease lasix to 20 mg po daily. Follow low Na diet and fluid restriction    Chronic kidney disease, stage III (moderate) - Plan check BMET 4/11 with results to Dr Caryl Comes     Hypertension  - BP mildly elevated; will follow and increase as needed.     Mild AS     NICM - continue ACEI; increase coreg to 6.25 mg BID; titrate as outpt    S/P PM   - FU Dr Caryl Comes  DC today with [facility] appt one week; BMET 4/11 with results to Dr Caryl Comes; FU Dr Caryl Comes 8 weeks  > 30 min PA and physician time D2  Signed, Kirk Ruths , MD 7:11 AM 11/09/2016

## 2016-11-09 NOTE — Discharge Instructions (Signed)
Your Janumet was stopped because of your kidney function and congestive heart failure. Your kidney function has remained stable. But, you need to remain off of Metformin (in Janumet).  Malik Rojas was prescribed to take the place of Janumet. Please make an appointment with [name], MD to discuss your diabetic medications further. A prescription was sent in for [name] with 1 refill to last you until [name] can take over the refills.

## 2016-11-09 NOTE — Progress Notes (Signed)
All d/c instructions explained and given to pt and his wife.  At 1115.  D/c off floor to awaiting transport.  Karie Kirks, Therapist, sports.

## 2016-11-09 NOTE — Discharge Summary (Addendum)
Discharge Summary    Patient ID: Malik Rojas,  MRN: [id number], DOB/AGE: 1932-05-02 83 y.o.  Admit date: 11/06/2016 Discharge date: 11/09/2016  Primary Care Provider: [name] Primary Cardiologist: Dr. Virl Axe   Discharge Diagnoses    Principal Problem:   Acute on chronic combined systolic and diastolic HF (heart failure), NYHA class 3 ([place]) Active Problems:   Pacemaker-Medtronic   Hypertensive heart disease with CHF (congestive heart failure) ([place])   Diabetes mellitus due to underlying condition with diabetic nephropathy, without long-term current use of insulin (HCC)   Chronic kidney disease, stage III (moderate)   Acute-on-chronic renal failure (HCC)   Allergies No Known Allergies  Diagnostic Studies/Procedures    Echo 11/07/16 Study Conclusions - Procedure narrative: Transthoracic echocardiography. Image   quality was fair. The study was technically difficult, as a   result of poor patient compliance and restricted patient   mobility. - Left ventricle: The cavity size was normal. Wall thickness was   increased in a pattern of severe LVH. Systolic function was   mildly reduced. The estimated ejection fraction was in the range   of 45% to 50%. Global hypokinesis. Doppler parameters are   consistent with abnormal left ventricular relaxation (grade 1   diastolic dysfunction). The E/e&' ratio is between 8-15,   suggesting indeterminate LV filling pressure. - Aortic valve: Calcified annulus. There is mild stenosis. Mean   gradient (S): 9 mm Hg. Peak gradient (S): 17 mm Hg. Valve area   (Vmax): 2.48 cm^2. Valve area (Vmean): 2.36 cm^2. - Mitral valve: Calcified annulus. Mildly thickened leaflets .   There was trivial regurgitation. - Left atrium: Moderately dilated. - Pericardium, extracardiac: A trivial pericardial effusion was   identified.   Impressions: - Technically difficult study. Compared to a prior study in 2017,   the LVEF is higher at  45-50%. There is at least mild aortic   stenosis.   _____________   History of Present Illness     Malik Rojas is a 83 y.o. male with a hx of non-obstructive CAD, HTN, DM2, HL, combined systolic and diastolic CHF, high grade HB, s/p pacer.  EF has been 35-40% in the past.   LHC in 3/14: Distal left main 10%, LAD 20%, circumflex 10-20%, proximal RCA 20%, distal 40-50%, EF 50-55%.   He was seen in the office as an add-on for progressive dyspnea on exertion and edema in his lower extremities.  He was felt to be in a/c combined systolic and diastolic CHF and was admitted to [facility] for IV diuresis.   Hospital Course     Consultants: none    He was admitted for IV diuresis as noted.  Echocardiogram demonstrated severe LVH, EF 38-10, grade 1 diastolic dysfunction and mild aortic stenosis with mean gradient 9 mmHg. He did have problems with his blood pressure running high requiring IV hydralazine at times. His blood pressure stabilized. His I/Os were not accurate but his weight was down 7 lbs at DC (DC weight 209 lbs).  He was evaluated this AM by Dr. Kirk Ruths.  Given him improved volume status, his Lasix was decreased to Lasxi 20 mg QD.  His coreg was increased to 6.25 mg bid given his elevated blood pressure.  He was felt to be stable for DC to home.  He will need a 7 day TCM follow up and a follow up BMET 11/13/16.  Of note, pharmacy recommended [place] his Metformin due to his CKD (SCr 1.64 -  2.15 this admit).  This will be held and DC and the patient will need close follow up with his PCP [name], MD).    _____________  Discharge Vitals Blood pressure (!) 152/70, pulse 75, temperature 98.4 F (36.9 C), temperature source Oral, resp. rate 18, height 5\' 5"  (1.651 m), weight 209 lb 11.2 oz (95.1 kg), SpO2 93 %.  Filed Weights   11/07/16 0538 11/08/16 0300 11/09/16 0534  Weight: 216 lb 6.4 oz (98.2 kg) 210 lb 6.4 oz (95.4 kg) 209 lb 11.2 oz (95.1 kg)    Labs &  Radiologic Studies    CBC  Recent Labs  11/06/16 1656  WBC 7.6  HGB 14.4  HCT 44.6  MCV 93.7  PLT 696   Basic Metabolic Panel  Recent Labs  11/06/16 1656  11/08/16 0439 11/09/16 0337  NA 140  < > 138 140  K 4.6  < > 4.8 3.9  CL 102  < > 98* 100*  CO2 31  < > 29 28  GLUCOSE 154*  < > 151* 130*  BUN 23*  < > 34* 38*  CREATININE 1.84*  < > 2.15* 1.92*  CALCIUM 9.2  < > 9.1 8.9  MG 1.8  --   --   --   < > = values in this interval not displayed. Liver Function Tests  Recent Labs  11/06/16 1656  AST 23  ALT 13*  ALKPHOS 58  BILITOT 0.8  PROT 6.7  ALBUMIN 3.7   No results for input(s): LIPASE, AMYLASE in the last 72 hours. Cardiac Enzymes  Recent Labs  11/06/16 1656 11/06/16 2213 11/07/16 0422  TROPONINI 0.04* 0.04* 0.04*   BNP Lab Results  Component Value Date   BNP 425.0 (H) 11/06/2016   BNP 142.4 (H) 09/28/2012    Thyroid Function Tests  Recent Labs  11/06/16 1656  TSH 1.716   _____________   Dg Chest 2 View  Result Date: 11/07/2016 IMPRESSION: Borderline mild congestive heart failure. Aortic atherosclerosis. Electronically Signed   By: Ilona Sorrel M.D.   On: 11/07/2016 07:54   Disposition   Pt is being discharged home today in good condition.  Follow-up Plans & Appointments    Follow-up Information    Virl Axe, MD Follow up in 1 week(s).   Specialty:  Cardiology Why:  The office will call to arrange a lab test for your kidney function (BMET) and a follow up with Dr. Caryl Comes or the PA or NP Contact information: [address] [place] [address] [phone]        [name], MD. Schedule an appointment as soon as possible for a visit in 1 week(s).   Specialty:  Family Medicine Why:  Please call to arrange an appointment to discuss your diabetic medications.  Contact information: [address] [place] [address] [phone]          Discharge Instructions    (HEART FAILURE  PATIENTS) Call MD:  Anytime you have any of the following symptoms: 1) 3 pound weight gain in 24 hours or 5 pounds in 1 week 2) shortness of breath, with or without a dry hacking cough 3) swelling in the hands, feet or stomach 4) if you have to sleep on extra pillows at night in order to breathe.    Complete by:  As directed    Diet - low sodium heart healthy    Complete by:  As directed    Diet Carb Modified  Complete by:  As directed    Increase activity slowly    Complete by:  As directed       Discharge Medications   Current Discharge Medication List    START taking these medications   Details  carvedilol (COREG) 6.25 MG tablet Take 1 tablet (6.25 mg total) by mouth 2 (two) times daily with a meal. Qty: 60 tablet, Refills: 11    furosemide (LASIX) 20 MG tablet Take 1 tablet (20 mg total) by mouth daily. Qty: 30 tablet, Refills: 11    isosorbide mononitrate (IMDUR) 30 MG 24 hr tablet Take 1 tablet (30 mg total) by mouth daily. Qty: 30 tablet, Refills: 11    linagliptin (TRADJENTA) 5 MG TABS tablet Take 1 tablet (5 mg total) by mouth daily. See primary care doctor for refills, follow up, etc. Qty: 30 tablet, Refills: 1    potassium chloride SA (K-DUR,KLOR-CON) 20 MEQ tablet Take 0.5 tablets (10 mEq total) by mouth daily. Qty: 30 tablet, Refills: 6      CONTINUE these medications which have NOT CHANGED   Details  aspirin 81 MG tablet Take 81 mg by mouth daily.    lisinopril (PRINIVIL,ZESTRIL) 10 MG tablet TAKE 1 TABLET BY MOUTH DAILY Qty: 90 tablet, Refills: 3    Omega-3 Fatty Acids (FISH OIL) 1000 MG CAPS Take 1,000 mg by mouth daily.     pravastatin (PRAVACHOL) 80 MG tablet take 1 tablet by mouth once daily Qty: 90 tablet, Refills: 0    silver sulfADIAZINE (SILVADENE) 1 % cream Apply 1 application topically daily. Qty: 50 g, Refills: 0      STOP taking these medications     JANUMET 50-1000 MG tablet           Outstanding Labs/Studies   BMET  11/13/16  Duration of Discharge Encounter   Greater than 30 minutes including physician time.  Signed, Virl Axe, MD  11/09/2016, 9:45 AM

## 2016-11-11 ENCOUNTER — Telehealth: Payer: Self-pay | Admitting: *Deleted

## 2016-11-11 ENCOUNTER — Telehealth: Payer: Self-pay | Admitting: Family Medicine

## 2016-11-11 NOTE — Telephone Encounter (Signed)
Called and spoke to pt concerning recent hospital visit. Pt states that he is feeling better. Pt states he does understand his meds and how to take him. He states he does have his meds at home. Appt that was made by hospital was confirmed and pt was informed to bring all meds with him to visit. After hour protocol was discussed and pt was informed to call office if he needed anything prior to Wednesday's appt.

## 2016-11-11 NOTE — Telephone Encounter (Signed)
-----   Message from Liliane Shi, [place] sent at 11/09/2016  8:54 AM EDT ----- Regarding: needs TCM follow up and BMET Patient:  Malik Rojas [id number]  Primary Cardiologist:  Dr. Virl Axe  DC from hospital on 11/09/2016  Please arrange FU in 1 week with Dr. Virl Axe or PA/NP. Please arrange a BMET 11/13/16 (I have placed order) This is a TCM appointment. Signed,  [name], PA-C   11/09/2016 8:55 AM

## 2016-11-12 NOTE — Telephone Encounter (Signed)
Patient contacted regarding discharge from [facility] on 11/09/16.  Patient understands to follow up with provider [name], PA on Monday 4/16 at 11:45 at [facility]. Patient understands discharge instructions? Yes per wife Patient understands medications and regiment? Yes per wife Patient understands to bring all medications to this visit? Yes per wife  Wife states patient went for a walk around the block yesterday and fell; states he did not get dizzy or light-headed that his feet just started moving faster than his body. She states it occurred one other time and she caught him before he fell. She states she then went to get the car and drove him back home. She states a man helped her get him up off the ground and he did not hit his head. She is aware to call back with additional questions or concerns.

## 2016-11-13 ENCOUNTER — Ambulatory Visit (INDEPENDENT_AMBULATORY_CARE_PROVIDER_SITE_OTHER): Payer: Medicare Other | Admitting: Family Medicine

## 2016-11-13 ENCOUNTER — Other Ambulatory Visit: Payer: Medicare Other

## 2016-11-13 ENCOUNTER — Encounter: Payer: Self-pay | Admitting: Family Medicine

## 2016-11-13 VITALS — BP 150/90 | HR 85 | Wt 217.0 lb

## 2016-11-13 DIAGNOSIS — E0821 Diabetes mellitus due to underlying condition with diabetic nephropathy: Secondary | ICD-10-CM

## 2016-11-13 DIAGNOSIS — I890 Lymphedema, not elsewhere classified: Secondary | ICD-10-CM | POA: Diagnosis not present

## 2016-11-13 DIAGNOSIS — E669 Obesity, unspecified: Secondary | ICD-10-CM | POA: Diagnosis not present

## 2016-11-13 DIAGNOSIS — Z9181 History of falling: Secondary | ICD-10-CM | POA: Diagnosis not present

## 2016-11-13 DIAGNOSIS — E785 Hyperlipidemia, unspecified: Secondary | ICD-10-CM | POA: Diagnosis not present

## 2016-11-13 DIAGNOSIS — T07XXXA Unspecified multiple injuries, initial encounter: Secondary | ICD-10-CM

## 2016-11-13 DIAGNOSIS — E1169 Type 2 diabetes mellitus with other specified complication: Secondary | ICD-10-CM | POA: Diagnosis not present

## 2016-11-13 DIAGNOSIS — I10 Essential (primary) hypertension: Secondary | ICD-10-CM | POA: Diagnosis not present

## 2016-11-13 DIAGNOSIS — E1159 Type 2 diabetes mellitus with other circulatory complications: Secondary | ICD-10-CM

## 2016-11-13 LAB — BASIC METABOLIC PANEL
BUN: 32 mg/dL — ABNORMAL HIGH (ref 7–25)
CALCIUM: 9.1 mg/dL (ref 8.6–10.3)
CO2: 28 mmol/L (ref 20–31)
Chloride: 105 mmol/L (ref 98–110)
Creat: 1.58 mg/dL — ABNORMAL HIGH (ref 0.70–1.11)
Glucose, Bld: 151 mg/dL — ABNORMAL HIGH (ref 65–99)
Potassium: 5.2 mmol/L (ref 3.5–5.3)
SODIUM: 142 mmol/L (ref 135–146)

## 2016-11-13 LAB — POCT UA - MICROALBUMIN
CREATININE, POC: 108.1 mg/dL
Microalbumin Ur, POC: >300 mg/L

## 2016-11-13 LAB — POCT GLYCOSYLATED HEMOGLOBIN (HGB A1C): Hemoglobin A1C: 6.8

## 2016-11-13 MED ORDER — LINAGLIPTIN 5 MG PO TABS: 5.0000 mg | ORAL_TABLET | Freq: Every day | ORAL | 1 refills | Status: DC

## 2016-11-13 NOTE — Progress Notes (Signed)
   Subjective:    Patient ID: Malik Rojas, male    DOB: 1931-10-16, 83 y.o.   MRN: [id number]  HPI He is here for a post hospital follow-up. His creatinine function is deteriorating and the metformin was stopped in the hospital. He is in the process of being switched over to [place]. Recently he went for walk and did fall sustaining an abrasion to the left elbow and knee. The hospital record including discharge summary, lab data was reviewed. He is not having any present chest pain or shortness of breath but he does get weak with physical activity. His legs are essentially unchanged. Medications were reviewed and are listed in the chart.   Review of Systems     Objective:   Physical Exam Alert and in no distress. Abrasion noted to the left elbow with good motion of the elbow. It is not warm, hot. Left knee was also evaluated and does show a healing abrasion with no evidence of infection.  A1c is 6.8      Assessment & Plan:  Multiple abrasions - Plan: Ambulatory referral to Physical Therapy  Chronic acquired lymphedema  Hypertension associated with diabetes ([place])  Diabetes mellitus due to underlying condition with diabetic nephropathy, without long-term current use of insulin (HCC) - Plan: Basic Metabolic Panel, POCT UA - Microalbumin, linagliptin (TRADJENTA) 5 MG TABS tablet  Hyperlipidemia associated with type 2 diabetes mellitus (HCC)  Obesity (BMI 30-39.9)  Risk for falls - Plan: Ambulatory referral to Physical Therapy He will start taking Tradjenta. His renal function is slowly deteriorating. We will need to monitor this. He will continue on lisinopril. I will send him to physical therapy as he has a risk for falls. Part of his issues deconditioning. He does have a cane however we'll do a further assessment and determine his true needs for ambulation. He has a follow-up appointment with cardiology in the near future. Approximately half an hour, greater than 50% spent in  counseling and coordination of care. Prescription written for him to get the Shingrix vaccine.

## 2016-11-18 ENCOUNTER — Ambulatory Visit (INDEPENDENT_AMBULATORY_CARE_PROVIDER_SITE_OTHER): Payer: Medicare Other | Admitting: Physician Assistant

## 2016-11-18 ENCOUNTER — Encounter: Payer: Self-pay | Admitting: Physician Assistant

## 2016-11-18 VITALS — BP 180/60 | HR 78 | Ht 68.0 in | Wt 221.8 lb

## 2016-11-18 DIAGNOSIS — Z950 Presence of cardiac pacemaker: Secondary | ICD-10-CM

## 2016-11-18 DIAGNOSIS — I5042 Chronic combined systolic (congestive) and diastolic (congestive) heart failure: Secondary | ICD-10-CM

## 2016-11-18 DIAGNOSIS — E785 Hyperlipidemia, unspecified: Secondary | ICD-10-CM | POA: Diagnosis not present

## 2016-11-18 DIAGNOSIS — I110 Hypertensive heart disease with heart failure: Secondary | ICD-10-CM | POA: Diagnosis not present

## 2016-11-18 DIAGNOSIS — N183 Chronic kidney disease, stage 3 unspecified: Secondary | ICD-10-CM

## 2016-11-18 MED ORDER — ISOSORBIDE MONONITRATE ER 60 MG PO TB24: 60.0000 mg | ORAL_TABLET | Freq: Every day | ORAL | 3 refills | Status: DC

## 2016-11-18 MED ORDER — FUROSEMIDE 40 MG PO TABS: 40.0000 mg | ORAL_TABLET | Freq: Every day | ORAL | 3 refills | Status: DC

## 2016-11-18 NOTE — Progress Notes (Signed)
Cardiology Office Note:    Date:  11/18/2016   ID:  Malik Rojas, DOB [birth year], MRN [id number]  PCP:  [name], MD  Cardiologist/Electrophysiologist:  Dr. Virl Axe   Referring MD: Denita Lung, MD   Chief Complaint  Patient presents with  . Hospitalization Follow-up    CHF    History of Present Illness:    Malik Rojas is a 83 y.o. male with a hx of non-obstructive CAD, HTN, DM2, HL, combined systolic and diastolic CHF, high grade HB, s/p pacer. EF has been 35-40% in the past.  LHC in 3/14: Distal left main 10%, LAD 20%, circumflex 10-20%, proximal RCA 20%, distal 40-50%, EF 50-55%.  He was admitted from the office 4/4-4/7 with a/c CHF.  Echo was done and showed improved LVF with EF 35-00 and mild diastolic dysfunction.  There was mild aortic stenosis with mean gradient 9 mmHg.  With IV diuresis, he improved and DC weight was 209 lbs.  SCr did increase some and the pharmacy recommended stopping his Metformin.  He was asked to follow up with his PCP.  His BP was difficult to control and his Coreg was increased prior to DC.  Of note, follow up BMET done 11/13/16 did show improved Creatinine at 1.58.  He did sustain a fall with some abrasions to his elbow And knee and this was evaluated by primary care.    He returns for post hospital follow up.  He is here today with his wife. Since DC from the hospital, he has continued to feel like his breathing is improved. His activity seems to be better as well. He denies chest discomfort or syncope. He sleeps in a recliner and has done so for years. He denies PND. He has chronic LE edema. His right leg is always bigger than his left. He denies any coughing or wheezing. Denies any bleeding issues. He does weigh at home but is not quite sure about how much weight he has gained. His weight is significantly higher here than it was at discharge from the hospital.  Prior CV studies:   The following studies were reviewed  today:  Echo 4/18 Severe LVH, EF 45-50, global HK, grade 1 diastolic dysfunction, mild aortic stenosis (mean 9, peak 17), MAC, trivial MR, moderate LAE, trivial pericardial effusion  Past Medical History:  Diagnosis Date  . Basal cell carcinoma    "scalp, arms, right face;  some cut, some burndt off"  . CAD (coronary artery disease)    LHC 3/14: Distal left main 10%, LAD 20%, circumflex 10-20%, proximal RCA 20%, distal 40-50%, EF 50-55%.  . Chronic combined systolic and diastolic CHF, NYHA class 4 ([place]) 11/06/2016   Echo 4/18: severe LVH, EF 45-50, global HK, Gr 1 DD, mild AS (mean 9/peak 17), MAC, mod LAE, trivial eff  . Complete heart block (HCC)    Status post Medtronic pacemaker  . High cholesterol   . HTN (hypertension)   . Hx of cardiovascular stress test    a. [place] MV 3/14:  Low risk, EF 36%, apical inf scar, no ischemia, inf-apical HK>> catheterization however demonstrated normal left ventricular function  . Melanoma ([place])    back  . Obesity   . Pneumonia 1940  . Presence of permanent cardiac pacemaker    Medtronic  . Type II diabetes mellitus ([place])   . Urinary frequency   . Urinary incontinence     Past Surgical History:  Procedure Laterality Date  . APPENDECTOMY  40  years ago  . BASAL CELL CARCINOMA EXCISION     "scalp, right face"  . INSERT / REPLACE / REMOVE PACEMAKER  ~ 1999   medtronic EnRhythm  . MOHS SURGERY Left    ear; being monitored-every 6 months  . PERMANENT PACEMAKER GENERATOR CHANGE N/A 02/12/2013   Procedure: PERMANENT PACEMAKER GENERATOR CHANGE;  Surgeon: Deboraha Sprang, MD;  Location: [facility] CATH LAB;  Service: Cardiovascular;  Laterality: N/A;  . TONSILLECTOMY     as child  . TRANSURETHRAL RESECTION OF PROSTATE N/A 10/13/2014   Procedure: WOLF TRANSURETHRAL RESECTION OF THE PROSTATE (TURP);  Surgeon: Carolan Clines, MD;  Location: [facility] ORS;  Service: Urology;  Laterality: N/A;    Current Medications: Current Meds  Medication Sig  . aspirin 81  MG tablet Take 81 mg by mouth daily.  . carvedilol (COREG) 6.25 MG tablet Take 1 tablet (6.25 mg total) by mouth 2 (two) times daily with a meal.  . linagliptin (TRADJENTA) 5 MG TABS tablet Take 1 tablet (5 mg total) by mouth daily. See primary care doctor for refills, follow up, etc.  . lisinopril (PRINIVIL,ZESTRIL) 10 MG tablet TAKE 1 TABLET BY MOUTH DAILY  . Omega-3 Fatty Acids (FISH OIL) 1000 MG CAPS Take 1,000 mg by mouth daily.   . potassium chloride SA (K-DUR,KLOR-CON) 20 MEQ tablet Take 0.5 tablets (10 mEq total) by mouth daily.  . pravastatin (PRAVACHOL) 80 MG tablet take 1 tablet by mouth once daily  . psyllium (REGULOID) 0.52 g capsule Take 0.52 g by mouth daily.  . silver sulfADIAZINE (SILVADENE) 1 % cream Apply 1 application topically daily as needed (LEG SORE).  . [DISCONTINUED] furosemide (LASIX) 20 MG tablet Take 1 tablet (20 mg total) by mouth daily.  . [DISCONTINUED] isosorbide mononitrate (IMDUR) 30 MG 24 hr tablet Take 1 tablet (30 mg total) by mouth daily.     Allergies:   Patient has no known allergies.   Social History   Social History  . Marital status: Married    Spouse name: Jolene  . Number of children: Y  . Years of education: N/A   Occupational History  . truck driver     retired   Social History Main Topics  . Smoking status: Former Smoker    Packs/day: 3.00    Years: 30.00    Quit date: 53  . Smokeless tobacco: Never Used  . Alcohol use Yes     Comment: 11/06/2016 "I'll have 1 drink/year, maybe"  . Drug use: No  . Sexual activity: Not Currently   Other Topics Concern  . None   Social History Narrative  . None     Family History  Problem Relation Age of Onset  . Leukemia Mother      ROS:   Please see the history of present illness.    ROS All other systems reviewed and are negative.   EKGs/Labs/Other Test Reviewed:    EKG:  EKG is ordered today.  The ekg ordered today demonstrates AV paced, HR 78  Recent Labs: 11/06/2016: ALT 13;  B Natriuretic Peptide 425.0; Hemoglobin 14.4; Magnesium 1.8; Platelets 186; TSH 1.716 11/13/2016: BUN 32; Creat 1.58; Potassium 5.2; Sodium 142   Recent Lipid Panel    Component Value Date/Time   CHOL 134 07/23/2016 0945   TRIG 211 (H) 07/23/2016 0945   HDL 39 (L) 07/23/2016 0945   CHOLHDL 3.4 07/23/2016 0945   VLDL 42 (H) 07/23/2016 0945   LDLCALC 53 07/23/2016 0945     Physical Exam:  VS:  BP (!) 180/60   Pulse 78   Ht 5' 8"  (1.727 m)   Wt 221 lb 12.8 oz (100.6 kg)   BMI 33.72 kg/m     Wt Readings from Last 3 Encounters:  11/18/16 221 lb 12.8 oz (100.6 kg)  11/13/16 217 lb (98.4 kg)  11/09/16 209 lb 11.2 oz (95.1 kg)     Physical Exam  Constitutional: He is oriented to person, place, and time. He appears well-developed and well-nourished. No distress.  HENT:  Head: Normocephalic and atraumatic.  Eyes: No scleral icterus.  Neck: Normal range of motion. No JVD (JVD is difficult to assess) present.  Cardiovascular: Normal rate, regular rhythm, S1 normal and S2 normal.   No murmur heard. Pulmonary/Chest: Effort normal. He has no wheezes. He has no rhonchi.  Coarse breath sounds at the bases  Abdominal: Soft. There is no tenderness.  Musculoskeletal: He exhibits edema (tight 1+ LE edema up to his knees with compression stockings on (R >L)).  Neurological: He is alert and oriented to person, place, and time.  Skin: Skin is warm and dry.  Psychiatric: He has a normal mood and affect.    ASSESSMENT:    1. Chronic combined systolic and diastolic CHF (congestive heart failure) ([place])   2. Hypertensive heart disease with chronic combined systolic and diastolic congestive heart failure ([place])   3. Chronic kidney disease, stage III (moderate)   4. Pacemaker-Medtronic   5. Hyperlipidemia, unspecified hyperlipidemia type    PLAN:    In order of problems listed above:  1. Chronic combined systolic and diastolic CHF (congestive heart failure) (HCC) -  Symptomatically, he  remains improved since DC from the hospital. His exam is difficult to assess for volume excess. His weight by our scales is higher than it was in the hospital. He is not quite certain how much weight he has gained at home. He seems to do well with limiting salt. I still think he has more volume to lose.  -  Increase Lasix to 40 mg daily  -  BMET 1 week  -  FU with me in 1 month and Dr. Virl Axe in 3 months.  2. Hypertensive heart disease with chronic combined systolic and diastolic congestive heart failure (HCC) - Blood pressures out of control. He does not check it routinely at home. I am hesitant to increase his ACE inhibitor given his chronic kidney disease.  -  Increase isosorbide to 60 mg daily  -  Adjust Lasix as noted  3. Chronic kidney disease, stage III (moderate) -  Plan: Basic Metabolic Panel (BMET) in one week.  4. Pacemaker-Medtronic - Follow-up with EP as planned.  5. Hyperlipidemia, unspecified hyperlipidemia type - Continue statin.   Dispo:  Return in about 4 weeks (around 12/16/2016) for Close Follow Up, w/ [name], PA-C.   Medication Adjustments/Labs and Tests Ordered: Current medicines are reviewed at length with the patient today.  Concerns regarding medicines are outlined above.  Medication changes, Labs and Tests ordered today are outlined in the Patient Instructions noted below. Patient Instructions  Medication Instructions:  1. INCREASE IMDUR TO 60 MG DAILY; NEW RX HAS BEEN SENT IN 2. INCREASE LASIX TO 40 MG DAILY;  NEW RX HAS BEEN SENT IN  Labwork: BMET TO BE DONE IN 1 WEEK  Testing/Procedures: NONE ORDERED  Follow-Up: 1. Norissa Bartee, PAC IN 1 MONTH 2. DR. Caryl Comes IN 3 MONTHS  Any Other Special Instructions Will Be Listed Below (If Applicable).  If  you need a refill on your cardiac medications before your next appointment, please call your pharmacy.   Signed, [name], PA-C  11/18/2016 12:50 PM    [facility]  [facility] [address], [place], [place]  [address] Phone: [phone]; Fax: [phone]

## 2016-11-18 NOTE — Patient Instructions (Addendum)
Medication Instructions:  1. INCREASE IMDUR TO 60 MG DAILY; NEW RX HAS BEEN SENT IN 2. INCREASE LASIX TO 40 MG DAILY;  NEW RX HAS BEEN SENT IN  Labwork: BMET TO BE DONE IN 1 WEEK  Testing/Procedures: NONE ORDERED  Follow-Up: 1. SCOTT WEAVER, PAC IN 1 MONTH 2. DR. Caryl Comes IN 3 MONTHS  Any Other Special Instructions Will Be Listed Below (If Applicable).  If you need a refill on your cardiac medications before your next appointment, please call your pharmacy.

## 2016-11-25 ENCOUNTER — Ambulatory Visit: Payer: Medicare Other | Admitting: Family Medicine

## 2016-11-25 ENCOUNTER — Telehealth: Payer: Self-pay | Admitting: *Deleted

## 2016-11-25 ENCOUNTER — Other Ambulatory Visit: Payer: Medicare Other

## 2016-11-25 DIAGNOSIS — N183 Chronic kidney disease, stage 3 unspecified: Secondary | ICD-10-CM

## 2016-11-25 DIAGNOSIS — I5042 Chronic combined systolic (congestive) and diastolic (congestive) heart failure: Secondary | ICD-10-CM

## 2016-11-25 LAB — BASIC METABOLIC PANEL
BUN / CREAT RATIO: 18 (ref 10–24)
BUN: 28 mg/dL — ABNORMAL HIGH (ref 8–27)
CALCIUM: 9 mg/dL (ref 8.6–10.2)
CO2: 27 mmol/L (ref 18–29)
CREATININE: 1.58 mg/dL — AB (ref 0.76–1.27)
Chloride: 103 mmol/L (ref 96–106)
GFR, EST AFRICAN AMERICAN: 46 mL/min/{1.73_m2} — AB (ref >59)
GFR, EST NON AFRICAN AMERICAN: 40 mL/min/{1.73_m2} — AB (ref >59)
Glucose: 174 mg/dL — ABNORMAL HIGH (ref 65–99)
Potassium: 4.4 mmol/L (ref 3.5–5.2)
Sodium: 143 mmol/L (ref 134–144)

## 2016-11-25 NOTE — Telephone Encounter (Signed)
Both pt and his wife have been notified of lab results by phone with verbal understanding. Pt's wife thanked me for my call today

## 2016-11-25 NOTE — Telephone Encounter (Signed)
-----   Message from Liliane Shi, [place] sent at 11/25/2016  4:59 PM EDT ----- Please call the patient. Kidney function is stable. Glucose is elevated.  All other parameters are within acceptable limits and no further intervention or testing required. Continue with current treatment plan. [name], PA-C   11/25/2016 4:59 PM

## 2016-11-28 ENCOUNTER — Ambulatory Visit: Payer: Medicare Other | Attending: Family Medicine | Admitting: Physical Therapy

## 2016-11-28 DIAGNOSIS — R2689 Other abnormalities of gait and mobility: Secondary | ICD-10-CM | POA: Insufficient documentation

## 2016-11-28 NOTE — Therapy (Signed)
[facility] [address] [place], [place], [address] Phone: [phone]   Fax:  [phone]  Physical Therapy Evaluation  Patient Details  Name: Malik Rojas MRN: [id number] Date of Birth: [birth year] Referring Provider: Jill Alexanders, MD  Encounter Date: 11/28/2016      PT End of Session - 11/28/16 2047    Visit Number 1   Number of Visits 1  Eval only-per patient request   Authorization Type UHC MCR   PT Start Time 1104   PT Stop Time 1147   PT Time Calculation (min) 43 min   Activity Tolerance Patient tolerated treatment well   Behavior During Therapy [facility] for tasks assessed/performed      Past Medical History:  Diagnosis Date  . Basal cell carcinoma    "scalp, arms, right face;  some cut, some burndt off"  . CAD (coronary artery disease)    LHC 3/14: Distal left main 10%, LAD 20%, circumflex 10-20%, proximal RCA 20%, distal 40-50%, EF 50-55%.  . Chronic combined systolic and diastolic CHF, NYHA class 4 (Malik Rojas) 11/06/2016   Echo 4/18: severe LVH, EF 45-50, global HK, Gr 1 DD, mild AS (mean 9/peak 17), MAC, mod LAE, trivial eff  . Complete heart block (HCC)    Status post Medtronic pacemaker  . High cholesterol   . HTN (hypertension)   . Hx of cardiovascular stress test    a. Malik Rojas 3/14:  Low risk, EF 36%, apical inf scar, no ischemia, inf-apical HK>> catheterization however demonstrated normal left ventricular function  . Melanoma ([place])    back  . Obesity   . Pneumonia 1940  . Presence of permanent cardiac pacemaker    Medtronic  . Type II diabetes mellitus ([place])   . Urinary frequency   . Urinary incontinence     Past Surgical History:  Procedure Laterality Date  . APPENDECTOMY  40 years ago  . BASAL CELL CARCINOMA EXCISION     "scalp, right face"  . INSERT / REPLACE / REMOVE PACEMAKER  ~ 1999   medtronic EnRhythm  . MOHS SURGERY Left    ear; being monitored-every 6 months  . PERMANENT  PACEMAKER GENERATOR CHANGE N/A 02/12/2013   Procedure: PERMANENT PACEMAKER GENERATOR CHANGE;  Surgeon: Malik Sprang, MD;  Location: [facility] CATH LAB;  Service: Cardiovascular;  Laterality: N/A;  . TONSILLECTOMY     as 83  . TRANSURETHRAL RESECTION OF PROSTATE N/A 10/13/2014   Procedure: WOLF TRANSURETHRAL RESECTION OF THE PROSTATE (TURP);  Surgeon: Malik Clines, MD;  Location: [facility] ORS;  Service: Urology;  Laterality: N/A;    There were no vitals filed for this visit.       Subjective Assessment - 11/28/16 1111    Subjective Pt reports being in the hospital, and once he came home he tried to get back to walking outside.  He fell outdoors with several skin abrasions.  This is his only fall.  He has cane, which he uses most of the time.   Patient is accompained by: Family member  wife   Pertinent History CHF, DM, HTN, CAD, pacemaker   Patient Stated Goals Pt's goal for therapy is:  pt unsure exactly why [name] referred him here   Currently in Pain? No/denies            [facility] PT Assessment - 11/28/16 1116      Assessment   Medical Diagnosis Falls   Referring Provider Malik Alexanders, MD   Onset Date/Surgical Date  11/12/16     Precautions   Precautions Fall     Balance Screen   Has the patient fallen in the past 6 months Yes   How many times? 1   Has the patient had a decrease in activity level because of a fear of falling?  No   Is the patient reluctant to leave their home because of a fear of falling?  No     Home Environment   Living Environment Private residence   Living Arrangements Spouse/significant other   Available Help at Discharge Family   Type of [place] to enter   Entrance Stairs-Number of Steps 3   Entrance Stairs-Rails None   Home Layout One level   [place] - single point     Prior Function   Level of Independence Independent with basic ADLs;Independent with household mobility without device;Independent with  community mobility without device   Leisure Enjoys going out to grocery store and [place], with wife     Observation/Other Assessments   Focus on Therapeutic Outcomes (FOTO)  Functional Intake status 52; ABC score 75.6%     ROM / Strength   AROM / PROM / Strength Strength     Strength   Overall Strength Comments Grossly tested at least 4 to 5/5 bilateral lower extremities     Transfers   Transfers Sit to Stand;Stand to Sit   Sit to Stand 6: Modified independent (Device/Increase time);With upper extremity assist;From chair/3-in-1   Stand to Sit 6: Modified independent (Device/Increase time);With upper extremity assist;To chair/3-in-1     Ambulation/Gait   Ambulation/Gait Yes   Ambulation/Gait Assistance 6: Modified independent (Device/Increase time)   Ambulation Distance (Feet) 150 Feet   Assistive device None   Gait Pattern Step-through pattern;Decreased step length - right;Poor foot clearance - right  Foot clearance improves with cane use   Ambulation Surface Level;Indoor   Gait velocity 15.6 sec = 2.1 ft/sec     Standardized Balance Assessment   Standardized Balance Assessment Timed Up and Go Test;Dynamic Gait Index     Dynamic Gait Index   Level Surface Mild Impairment   Change in Gait Speed Mild Impairment   Gait with Horizontal Head Turns Moderate Impairment   Gait with Vertical Head Turns Moderate Impairment   Gait and Pivot Turn Mild Impairment  3.72 sec   Step Over Obstacle Moderate Impairment   Step Around Obstacles Mild Impairment   Steps Moderate Impairment   Total Score 12   DGI comment: Scores <19/24 indicate increased fall risk.     Timed Up and Go Test   Normal TUG (seconds) 20.22   TUG Comments Scores >13.5 sec indicate increased fall risk.                           PT Education - 11/28/16 2046    Education provided Yes   Education Details Educated in fall risk based on objective measures.  Educated in benefits  of cane use for improved safety with gait; educated in benefits of PT-Pt and wife decline PT at this time.   Person(s) Educated Patient   Methods Explanation   Comprehension Verbalized understanding                    Plan - 11/28/16 2048    Clinical Impression Statement Pt is an 83 year old male who presents to OP PT with history of  recent hospitalization, one fall in the past 6 months.  This recent fall was outdoor and patient/wife feel it was due to walking too far/"just giving out".  Pt presents with decreased balance, decreased timing/coordination with gait.  Pt at fall risk per TUG score 20.22 sec and DGI score 12/24.  Pt has limited community ambulator speed of 2.1 ft/sec.  Pt would benefit from skilled PT to address the above stated deficits; however, patient feels he does not need PT and agrees to use cane for improved safety with outdoor and long distance gait.   PT Frequency 1x / week  Eval only   PT Next Visit Plan PT eval only, as patient declines further PT recommendations   Consulted and Agree with Plan of Care Patient;Family member/caregiver   Family Member Consulted Wife      Patient will benefit from skilled therapeutic intervention in order to improve the following deficits and impairments:  Abnormal gait, Decreased balance  Visit Diagnosis: Other abnormalities of gait and mobility      G-Codes - Dec 16, 2016 10/17/2049    Functional Assessment Tool Used (Outpatient Only) TUG 20.22 sec, gait velocity 2.1 ft/sec, DGI 12/24   Functional Limitation Mobility: Walking and moving around   Mobility: Walking and Moving Around Current Status [phone]) At least 20 percent but less than 40 percent impaired, limited or restricted   Mobility: Walking and Moving Around Goal Status [phone]) At least 20 percent but less than 40 percent impaired, limited or restricted   Mobility: Walking and Moving Around Discharge Status [phone]) At least 20 percent but less than 40 percent impaired,  limited or restricted       Problem List Patient Active Problem List   Diagnosis Date Noted  . Acute-on-chronic renal failure ([place]) 11/09/2016  . Chronic combined systolic and diastolic CHF (congestive heart failure) ([place]) 11/06/2016  . Squamous cell carcinoma of skin 02/20/2016  . Chronic acquired lymphedema 08/02/2015  . Mixed incontinence 03/23/2015  . Benign prostatic hypertrophy (BPH) with weak urinary stream 10/13/2014  . Hyperlipidemia associated with type 2 diabetes mellitus ([place]) 05/25/2013  . Chronic kidney disease, stage III (moderate) 05/25/2013  . Diabetes mellitus due to underlying condition with diabetic nephropathy, without long-term current use of insulin ([place]) 10/01/2011  . Xerostomia 08/28/2011  . Hypertensive heart disease with CHF (congestive heart failure) ([place]) 08/28/2011  . Obesity (BMI 30-39.9) 05/30/2011  . Pacemaker-Medtronic 11/21/2010  . DEPRESSION/ANXIETY 08/29/2009  . Obstructive sleep apnea 07/21/2009  . FATIGUE 07/21/2009    Aideen Fenster W. 16-Dec-2016, 8:52 PM  Frazier Butt., PT  [facility] [address] [place], [place], [address] Phone: [phone]   Fax:  [phone]  Name: Kyrillos Adams MRN: [id number] Date of Birth: [birth year]

## 2016-11-30 ENCOUNTER — Other Ambulatory Visit: Payer: Self-pay | Admitting: Family Medicine

## 2016-12-01 ENCOUNTER — Other Ambulatory Visit: Payer: Self-pay | Admitting: Family Medicine

## 2016-12-02 ENCOUNTER — Encounter: Payer: Self-pay | Admitting: Family Medicine

## 2016-12-02 ENCOUNTER — Ambulatory Visit (INDEPENDENT_AMBULATORY_CARE_PROVIDER_SITE_OTHER): Payer: Medicare Other | Admitting: Family Medicine

## 2016-12-02 VITALS — BP 140/80 | Wt 220.0 lb

## 2016-12-02 DIAGNOSIS — E118 Type 2 diabetes mellitus with unspecified complications: Secondary | ICD-10-CM

## 2016-12-02 DIAGNOSIS — E0821 Diabetes mellitus due to underlying condition with diabetic nephropathy: Secondary | ICD-10-CM

## 2016-12-02 NOTE — Telephone Encounter (Signed)
Dr.Lalonde the pt is no longer on this med is he

## 2016-12-03 ENCOUNTER — Encounter: Payer: Self-pay | Admitting: Family Medicine

## 2016-12-03 NOTE — Progress Notes (Signed)
   Subjective:    Patient ID: Malik Rojas, male    DOB: [birth year], 83 y.o.   MRN: [id number]  HPI He is here with his wife. Apparently Sunday morning he checked his blood sugar was 242 which is higher than normal. He then checked it again later and it was 180. He apparently also was having decreased urinary flow. Since then he has checked his blood sugar and this morning it was 145 and his urine has returned to normal. He continued on his present medications and is had no polydipsia of or weight change.   Review of Systems     Objective:   Physical Exam Alert and in no distress. Today's blood sugar is under 200       Assessment & Plan:  Diabetes mellitus due to underlying condition with diabetic nephropathy, without long-term current use of insulin ([place]) I reassured him and his wife that he was in no danger and when he sees his blood sugars elevated, will get his previous eating to see what might have made it raise.

## 2016-12-04 ENCOUNTER — Telehealth: Payer: Self-pay | Admitting: Family Medicine

## 2016-12-04 NOTE — Telephone Encounter (Signed)
Pt's wife came in and dropped off parking placard form. This is a renewal. He has had this in the past. He doesn't drive anymore but his wife does and states this is needed due to his inability to walk and maneuver. Sending back to be completed. Please call [phone] when ready.

## 2016-12-09 ENCOUNTER — Telehealth: Payer: Self-pay

## 2016-12-09 NOTE — Telephone Encounter (Signed)
It might be time to readjust his medications ;have them set up an appointment

## 2016-12-09 NOTE — Telephone Encounter (Signed)
Called and tried to leave message and machine kept hanging up on me

## 2016-12-09 NOTE — Telephone Encounter (Signed)
Patients wife called and said his sugars continue to go up yesterday it was 174 this morning 187 please advise what to tell them

## 2016-12-10 NOTE — Telephone Encounter (Signed)
Same thing today

## 2016-12-11 NOTE — Telephone Encounter (Signed)
Left word for word message

## 2016-12-16 ENCOUNTER — Ambulatory Visit (INDEPENDENT_AMBULATORY_CARE_PROVIDER_SITE_OTHER): Payer: Medicare Other | Admitting: Family Medicine

## 2016-12-16 DIAGNOSIS — E118 Type 2 diabetes mellitus with unspecified complications: Secondary | ICD-10-CM | POA: Diagnosis not present

## 2016-12-16 DIAGNOSIS — E0821 Diabetes mellitus due to underlying condition with diabetic nephropathy: Secondary | ICD-10-CM

## 2016-12-16 MED ORDER — INSULIN GLARGINE 300 UNIT/ML ~~LOC~~ SOPN: 10.0000 [IU] | PEN_INJECTOR | Freq: Every day | SUBCUTANEOUS | 5 refills | Status: DC

## 2016-12-16 NOTE — Progress Notes (Addendum)
   Subjective:    Patient ID: Malik Rojas, male    DOB: [birth year], 83 y.o.   MRN: [id number]  HPI He is here for recheck. He is now taking Trajenta and now off of metformin due to his renal functions. His blood sugars have been running as high as 190 on that regimen.   Review of Systems     Objective:   Physical Exam  Alert and in no distress. Blood sugar readings at home were reviewed.      Assessment & Plan:  Diabetes mellitus due to underlying condition with diabetic nephropathy, without long-term current use of insulin ([place]) - Plan: Insulin Glargine (TOUJEO MAX SOLOSTAR) 300 UNIT/ML SOPN  Controlled diabetes mellitus type 2 with complications, unspecified whether long term insulin use ([place])  I explained that there are certain medicines that we will not be able to use because of his renal function. I think the safest thing would be to put him on long-acting insulin. Discussed proper use of this medicine. Work on getting the blood sugar down by 2 units every 2 days until blood sugar is under 120. The wife will be given a shot. She is to call later in the week to let us know how he is doing.

## 2016-12-16 NOTE — Patient Instructions (Signed)
Start with 10 units every day and increase by 2 units every 2 days until the morning blood sugar is around the 120. If you have any questions about it call

## 2016-12-18 ENCOUNTER — Ambulatory Visit (INDEPENDENT_AMBULATORY_CARE_PROVIDER_SITE_OTHER): Payer: Medicare Other | Admitting: Physician Assistant

## 2016-12-18 ENCOUNTER — Encounter: Payer: Self-pay | Admitting: Physician Assistant

## 2016-12-18 VITALS — BP 160/70 | HR 72 | Ht 68.0 in | Wt 219.8 lb

## 2016-12-18 DIAGNOSIS — I110 Hypertensive heart disease with heart failure: Secondary | ICD-10-CM

## 2016-12-18 DIAGNOSIS — N183 Chronic kidney disease, stage 3 unspecified: Secondary | ICD-10-CM

## 2016-12-18 DIAGNOSIS — R2689 Other abnormalities of gait and mobility: Secondary | ICD-10-CM | POA: Diagnosis not present

## 2016-12-18 DIAGNOSIS — I5042 Chronic combined systolic (congestive) and diastolic (congestive) heart failure: Secondary | ICD-10-CM

## 2016-12-18 MED ORDER — LISINOPRIL 20 MG PO TABS: 20.0000 mg | ORAL_TABLET | Freq: Every day | ORAL | 3 refills | Status: DC

## 2016-12-18 NOTE — Patient Instructions (Signed)
Medication Instructions:  1. INCREASE LISINOPRIL TO 20 MG ONE TABLET ONCE A DAY; NEW RX HAS BEEN SENT IN  Labwork: 2 WEEKS FOR BMET; DUE TO MEDICATION INCREASE   Testing/Procedures: NONE ORDERED  Follow-Up: KEEP UPCOMING APPT WITH DR. Caryl Comes 02/19/17  Any Other Special Instructions Will Be Listed Below (If Applicable).     If you need a refill on your cardiac medications before your next appointment, please call your pharmacy.

## 2016-12-18 NOTE — Progress Notes (Signed)
Cardiology Office Note:    Date:  12/18/2016   ID:  Malik Rojas, DOB [birth year], MRN [id number]  PCP:  Denita Lung, MD  Cardiologist/Electrophysiologist:  Dr. Virl Axe   Referring MD: Denita Lung, MD   Chief Complaint  Patient presents with  . Congestive Heart Failure    follow up    History of Present Illness:    Malik Rojas is a 83 y.o. male with a hx of non-obstructive CAD, HTN, DM2, HL, combined systolic and diastolic CHF, high grade HB, s/p pacer. EF has been 35-40% in the past. LHC in 3/14: Distal left main 10%, LAD 20%, circumflex 10-20%, proximal RCA 20%, distal 40-50%, EF 50-55%.  He was admitted from the office in 4/18 with a/c CHF.  Echo was done and showed improved LVF with EF 82-42 and mild diastolic dysfunction.  There was mild aortic stenosis with mean gradient 9 mmHg.   Last seen 11/18/16. He seemed somewhat volume overloaded. His blood pressure was also uncontrolled. Isosorbide was increased as well as his Lasix. He returns for follow-up.  He is here with his wife.  He brings in a list of his weights.  They have remained stable.  He denies significant worsening in his chronic dyspnea on exertion.  He denies chest pain, syncope, orthopnea, PND.  His LE edema is unchanged.  He denies any bleeding issues.  He does continue to complain of fatigue.  His PCP just put him on basal insulin for his diabetes.    Prior CV studies:   The following studies were reviewed today:  Echo 4/18 Severe LVH, EF 45-50, global HK, grade 1 diastolic dysfunction, mild aortic stenosis (mean 9, peak 17), MAC, trivial MR, moderate LAE, trivial pericardial effusion    Past Medical History:  Diagnosis Date  . Basal cell carcinoma    "scalp, arms, right face;  some cut, some burndt off"  . CAD (coronary artery disease)    LHC 3/14: Distal left main 10%, LAD 20%, circumflex 10-20%, proximal RCA 20%, distal 40-50%, EF 50-55%.  . Chronic combined systolic and diastolic  CHF, NYHA class 4 ([place]) 11/06/2016   Echo 4/18: severe LVH, EF 45-50, global HK, Gr 1 DD, mild AS (mean 9/peak 17), MAC, mod LAE, trivial eff  . Complete heart block (HCC)    Status post Medtronic pacemaker  . High cholesterol   . HTN (hypertension)   . Hx of cardiovascular stress test    a. [place] MV 3/14:  Low risk, EF 36%, apical inf scar, no ischemia, inf-apical HK>> catheterization however demonstrated normal left ventricular function  . Melanoma ([place])    back  . Obesity   . Pneumonia 1940  . Presence of permanent cardiac pacemaker    Medtronic  . Type II diabetes mellitus ([place])   . Urinary frequency   . Urinary incontinence     Past Surgical History:  Procedure Laterality Date  . APPENDECTOMY  40 years ago  . BASAL CELL CARCINOMA EXCISION     "scalp, right face"  . INSERT / REPLACE / REMOVE PACEMAKER  ~ 1999   medtronic EnRhythm  . MOHS SURGERY Left    ear; being monitored-every 6 months  . PERMANENT PACEMAKER GENERATOR CHANGE N/A 02/12/2013   Procedure: PERMANENT PACEMAKER GENERATOR CHANGE;  Surgeon: Deboraha Sprang, MD;  Location: [facility] CATH LAB;  Service: Cardiovascular;  Laterality: N/A;  . TONSILLECTOMY     as child  . TRANSURETHRAL RESECTION OF PROSTATE N/A 10/13/2014  Procedure: WOLF TRANSURETHRAL RESECTION OF THE PROSTATE (TURP);  Surgeon: Carolan Clines, MD;  Location: [facility] ORS;  Service: Urology;  Laterality: N/A;    Current Medications: Current Meds  Medication Sig  . aspirin 81 MG tablet Take 81 mg by mouth daily.  . carvedilol (COREG) 6.25 MG tablet Take 1 tablet (6.25 mg total) by mouth 2 (two) times daily with a meal.  . CINNAMON PO Take 1,000 mg by mouth 2 (two) times daily as needed.  . furosemide (LASIX) 40 MG tablet Take 1 tablet (40 mg total) by mouth daily.  . Insulin Glargine (TOUJEO MAX SOLOSTAR) 300 UNIT/ML SOPN Inject 10 Units into the skin daily.  . isosorbide mononitrate (IMDUR) 60 MG 24 hr tablet Take 60 mg by mouth daily.  Marland Kitchen linagliptin  (TRADJENTA) 5 MG TABS tablet Take 1 tablet (5 mg total) by mouth daily. See primary care doctor for refills, follow up, etc.  . Omega-3 Fatty Acids (FISH OIL) 1000 MG CAPS Take 1,000 mg by mouth daily.   . ONE TOUCH ULTRA TEST test strip TEST twice a day  . ONETOUCH DELICA LANCETS 41O MISC TEST TWICE DAILY  . potassium chloride (K-DUR) 10 MEQ tablet Take 10 mEq by mouth daily.  . pravastatin (PRAVACHOL) 80 MG tablet take 1 tablet by mouth once daily  . psyllium (REGULOID) 0.52 g capsule Take 0.52 g by mouth daily.  . silver sulfADIAZINE (SILVADENE) 1 % cream Apply 1 application topically daily as needed (LEG SORE).  . [DISCONTINUED] lisinopril (PRINIVIL,ZESTRIL) 10 MG tablet TAKE 1 TABLET BY MOUTH DAILY     Allergies:   Patient has no known allergies.   Social History   Social History  . Marital status: Married    Spouse name: Jolene  . Number of children: Y  . Years of education: N/A   Occupational History  . truck driver     retired   Social History Main Topics  . Smoking status: Former Smoker    Packs/day: 3.00    Years: 30.00    Quit date: 14  . Smokeless tobacco: Never Used  . Alcohol use Yes     Comment: 11/06/2016 "I'll have 1 drink/year, maybe"  . Drug use: No  . Sexual activity: Not Currently   Other Topics Concern  . None   Social History Narrative  . None     Family Hx: The patient's family history includes Leukemia in his mother.  ROS:   Please see the history of present illness.    ROS All other systems reviewed and are negative.   EKGs/Labs/Other Test Reviewed:    EKG:  EKG is not  ordered today.  The ekg ordered today demonstrates n/a  Recent Labs: 11/06/2016: ALT 13; B Natriuretic Peptide 425.0; Hemoglobin 14.4; Magnesium 1.8; Platelets 186; TSH 1.716 11/25/2016: BUN 28; Creatinine, Ser 1.58; Potassium 4.4; Sodium 143   Recent Lipid Panel    Component Value Date/Time   CHOL 134 07/23/2016 0945   TRIG 211 (H) 07/23/2016 0945   HDL 39 (L)  07/23/2016 0945   CHOLHDL 3.4 07/23/2016 0945   VLDL 42 (H) 07/23/2016 0945   LDLCALC 53 07/23/2016 0945     Physical Exam:    VS:  BP (!) 160/70   Pulse 72   Ht _0  (1.727 m)   Wt 219 lb 12.8 oz (99.7 kg)   BMI 33.42 kg/m     Wt Readings from Last 3 Encounters:  12/18/16 219 lb 12.8 oz (99.7 kg)  12/02/16  220 lb (99.8 kg)  11/18/16 221 lb 12.8 oz (100.6 kg)     Physical Exam  Constitutional: He is oriented to person, place, and time. He appears well-developed and well-nourished. No distress.  HENT:  Head: Normocephalic and atraumatic.  Eyes: No scleral icterus.  Neck: Normal range of motion. No JVD (difficult to assess JVD) present.  Cardiovascular: Normal rate, regular rhythm, S1 normal and S2 normal.   No murmur heard. Pulmonary/Chest: Effort normal and breath sounds normal. He has no wheezes. He has no rhonchi. He has no rales.  Abdominal: Soft. There is no tenderness.  Musculoskeletal: He exhibits edema (bilateral edema - R (1-2+)  >> L (trace-1+)).  Neurological: He is alert and oriented to person, place, and time.  Skin: Skin is warm and dry.  Psychiatric: He has a normal mood and affect.    ASSESSMENT:    1. Chronic combined systolic and diastolic CHF (congestive heart failure) ([place])   2. Hypertensive heart disease with chronic combined systolic and diastolic congestive heart failure ([place])   3. Chronic kidney disease, stage III (moderate)   4. Balance problem    PLAN:    In order of problems listed above:  1. Chronic combined systolic and diastolic CHF (congestive heart failure) (HCC) -  Volume appears stable.  He is NYHA 2b.  His BP needs better control.  We discussed appropriate diet and fluid intake.    -  Increase Lisinopril to 20 mg QD  -  Plan: Basic metabolic panel in 2 weeks.   -  Continue current dose of beta-blocker, nitrates  2. Hypertensive heart disease with chronic combined systolic and diastolic congestive heart failure (HCC) - BP above  target.  Increase Lisinopril as noted above.    3. Chronic kidney disease, stage III (moderate) - Will check BMET in 2 weeks to keep a close eye on renal function.    4. Balance problem - He notes a hx of falls and loss of balance.  He also seems to be deconditioned.  If his symptoms continue, he should consider a PT evaluation.  He can d/w his PCP or we can give him a referral.   Dispo:  Return in about 2 months (around 02/19/2017) for Scheduled Follow Up with Dr. Caryl Comes.   Medication Adjustments/Labs and Tests Ordered: Current medicines are reviewed at length with the patient today.  Concerns regarding medicines are outlined above.  Orders/Tests:  Orders Placed This Encounter  Procedures  . Basic metabolic panel   Medication changes: Meds ordered this encounter  Medications  . lisinopril (PRINIVIL,ZESTRIL) 20 MG tablet    Sig: Take 1 tablet (20 mg total) by mouth daily.    Dispense:  90 tablet    Refill:  3   Signed, [name], PA-C  12/18/2016 11:56 AM    [facility] [place], [place], [place]  [address] Phone: [phone]; Fax: [phone]

## 2016-12-23 ENCOUNTER — Telehealth: Payer: Self-pay

## 2016-12-23 NOTE — Telephone Encounter (Signed)
Mrs.Rogue called and said they are on 12 units Sat. 145,Sun.197,and Today 187 she asked should they move up 2 units please advise

## 2016-12-23 NOTE — Telephone Encounter (Signed)
Increase it by 2 units and have them call us Thursday or Friday

## 2016-12-23 NOTE — Telephone Encounter (Signed)
Left word for word message on VM 

## 2017-01-02 ENCOUNTER — Other Ambulatory Visit: Payer: Self-pay

## 2017-01-02 ENCOUNTER — Telehealth: Payer: Self-pay | Admitting: Family Medicine

## 2017-01-02 DIAGNOSIS — E0821 Diabetes mellitus due to underlying condition with diabetic nephropathy: Secondary | ICD-10-CM

## 2017-01-02 MED ORDER — INSULIN PEN NEEDLE 32G X 6 MM MISC: 3 refills | Status: DC

## 2017-01-02 MED ORDER — INSULIN GLARGINE 300 UNIT/ML ~~LOC~~ SOPN: 12.0000 [IU] | PEN_INJECTOR | Freq: Every day | SUBCUTANEOUS | 5 refills | Status: DC

## 2017-01-02 NOTE — Telephone Encounter (Signed)
[facility] states pt states uses 10 units for 2 days then 12 units for 2 days, please confirm. Also needs rx for BD nano pen needles

## 2017-01-02 NOTE — Telephone Encounter (Signed)
I have talked to pharmacist and got this took care of

## 2017-01-02 NOTE — Telephone Encounter (Signed)
Go over the regimen but he needs to take the 12 units and stay with it. You do not alternate between 10 and 12 units

## 2017-01-24 ENCOUNTER — Other Ambulatory Visit: Payer: Self-pay | Admitting: Family Medicine

## 2017-02-04 DIAGNOSIS — D485 Neoplasm of uncertain behavior of skin: Secondary | ICD-10-CM | POA: Diagnosis not present

## 2017-02-04 DIAGNOSIS — C44622 Squamous cell carcinoma of skin of right upper limb, including shoulder: Secondary | ICD-10-CM | POA: Diagnosis not present

## 2017-02-04 DIAGNOSIS — L570 Actinic keratosis: Secondary | ICD-10-CM | POA: Diagnosis not present

## 2017-02-06 ENCOUNTER — Ambulatory Visit (INDEPENDENT_AMBULATORY_CARE_PROVIDER_SITE_OTHER): Payer: Medicare Other | Admitting: *Deleted

## 2017-02-06 ENCOUNTER — Telehealth: Payer: Self-pay | Admitting: Cardiology

## 2017-02-06 DIAGNOSIS — I442 Atrioventricular block, complete: Secondary | ICD-10-CM | POA: Diagnosis not present

## 2017-02-06 NOTE — Progress Notes (Signed)
Remote pacemaker transmission.   

## 2017-02-06 NOTE — Telephone Encounter (Signed)
Spoke with pt and reminded pt of remote transmission that is due today. Pt verbalized understanding.   

## 2017-02-07 LAB — CUP PACEART REMOTE DEVICE CHECK
Battery Remaining Longevity: 84 mo
Brady Statistic AS VS Percent: 0 %
Date Time Interrogation Session: 20180705170555
Implantable Lead Implant Date: 20051205
Implantable Lead Location: 753859
Implantable Lead Location: 753860
Implantable Lead Model: 5076
Implantable Pulse Generator Implant Date: 20140711
Lead Channel Impedance Value: 732 Ohm
Lead Channel Pacing Threshold Amplitude: 0.5 V
Lead Channel Setting Pacing Amplitude: 2.5 V
Lead Channel Setting Pacing Pulse Width: 0.4 ms
Lead Channel Setting Sensing Sensitivity: 2.8 mV
MDC IDC LEAD IMPLANT DT: 20051205
MDC IDC MSMT BATTERY IMPEDANCE: 326 Ohm
MDC IDC MSMT BATTERY VOLTAGE: 2.79 V
MDC IDC MSMT LEADCHNL RA IMPEDANCE VALUE: 619 Ohm
MDC IDC MSMT LEADCHNL RA SENSING INTR AMPL: 2.8 mV
MDC IDC MSMT LEADCHNL RV PACING THRESHOLD PULSEWIDTH: 0.4 ms
MDC IDC SET LEADCHNL RA PACING AMPLITUDE: 2.5 V
MDC IDC STAT BRADY AP VP PERCENT: 41 %
MDC IDC STAT BRADY AP VS PERCENT: 0 %
MDC IDC STAT BRADY AS VP PERCENT: 59 %

## 2017-02-13 ENCOUNTER — Encounter: Payer: Self-pay | Admitting: Family Medicine

## 2017-02-14 ENCOUNTER — Encounter: Payer: Self-pay | Admitting: Cardiology

## 2017-02-19 ENCOUNTER — Encounter: Payer: Self-pay | Admitting: Internal Medicine

## 2017-02-19 ENCOUNTER — Ambulatory Visit (INDEPENDENT_AMBULATORY_CARE_PROVIDER_SITE_OTHER): Payer: Medicare Other | Admitting: Internal Medicine

## 2017-02-19 VITALS — BP 160/82 | HR 80 | Ht 67.0 in | Wt 220.0 lb

## 2017-02-19 DIAGNOSIS — I472 Ventricular tachycardia, unspecified: Secondary | ICD-10-CM

## 2017-02-19 DIAGNOSIS — I442 Atrioventricular block, complete: Secondary | ICD-10-CM | POA: Diagnosis not present

## 2017-02-19 DIAGNOSIS — Z950 Presence of cardiac pacemaker: Secondary | ICD-10-CM

## 2017-02-19 DIAGNOSIS — I429 Cardiomyopathy, unspecified: Secondary | ICD-10-CM

## 2017-02-19 MED ORDER — LISINOPRIL 20 MG PO TABS: 20.0000 mg | ORAL_TABLET | Freq: Two times a day (BID) | ORAL | 3 refills | Status: DC

## 2017-02-19 MED ORDER — CARVEDILOL 12.5 MG PO TABS
12.5000 mg | ORAL_TABLET | Freq: Two times a day (BID) | ORAL | 3 refills | Status: DC
Start: 2017-02-19 — End: 2017-04-03

## 2017-02-19 NOTE — Progress Notes (Signed)
Patient Care Team: Denita Lung, MD as PCP - General   HPI  Malik Rojas is a 83 y.o. male seen in followup for pacemaker implantation for high-grade heart block and presyncope.  March 2014 left heart cath showed no significant coronary disease;  ejection fraction by cath was 50-55% as opposed to a noninvasive imaging. This had measured an ejection fraction of 25%. He was treated for heart failure with IV Lasix and BiPAP. Most recent echocardiogram 3/17 EF 35%. 4/18 EF 45-50%     418 he presented with massive volume overload he was admitted to the hospital and diuresed about 8 pounds. His weights have been stable ever since. He continues to complain of shortness of breath although is somewhat less than it was before. Blood sugars have been in the 150-190 range. He was started on insulin the hospital. He is also on oral hypoglycemic which costs $500 a month.      Past Medical History:  Diagnosis Date  . Basal cell carcinoma    "scalp, arms, right face;  some cut, some burndt off"  . CAD (coronary artery disease)    LHC 3/14: Distal left main 10%, LAD 20%, circumflex 10-20%, proximal RCA 20%, distal 40-50%, EF 50-55%.  . Chronic combined systolic and diastolic CHF, NYHA class 4 ([place]) 11/06/2016   Echo 4/18: severe LVH, EF 45-50, global HK, Gr 1 DD, mild AS (mean 9/peak 17), MAC, mod LAE, trivial eff  . Complete heart block (HCC)    Status post Medtronic pacemaker  . High cholesterol   . HTN (hypertension)   . Hx of cardiovascular stress test    a. [place] MV 3/14:  Low risk, EF 36%, apical inf scar, no ischemia, inf-apical HK>> catheterization however demonstrated normal left ventricular function  . Melanoma ([place])    back  . Obesity   . Pneumonia 1940  . Presence of permanent cardiac pacemaker    Medtronic  . Type II diabetes mellitus ([place])   . Urinary frequency   . Urinary incontinence     Past Surgical History:  Procedure Laterality Date  . APPENDECTOMY  40  years ago  . BASAL CELL CARCINOMA EXCISION     "scalp, right face"  . INSERT / REPLACE / REMOVE PACEMAKER  ~ 1999   medtronic EnRhythm  . MOHS SURGERY Left    ear; being monitored-every 6 months  . PERMANENT PACEMAKER GENERATOR CHANGE N/A 02/12/2013   Procedure: PERMANENT PACEMAKER GENERATOR CHANGE;  Surgeon: Deboraha Sprang, MD;  Location: [facility] CATH LAB;  Service: Cardiovascular;  Laterality: N/A;  . TONSILLECTOMY     as child  . TRANSURETHRAL RESECTION OF PROSTATE N/A 10/13/2014   Procedure: WOLF TRANSURETHRAL RESECTION OF THE PROSTATE (TURP);  Surgeon: Carolan Clines, MD;  Location: [facility] ORS;  Service: Urology;  Laterality: N/A;    Current Outpatient Prescriptions  Medication Sig Dispense Refill  . aspirin 81 MG tablet Take 81 mg by mouth daily.    . carvedilol (COREG) 6.25 MG tablet Take 1 tablet (6.25 mg total) by mouth 2 (two) times daily with a meal. 60 tablet 11  . CINNAMON PO Take 1,000 mg by mouth 2 (two) times daily as needed.    . Insulin Glargine (TOUJEO MAX SOLOSTAR) 300 UNIT/ML SOPN Inject 12 Units into the skin daily. 1 pen 5  . Insulin Pen Needle (BD ULTRA-FINE MICRO PEN NEEDLE) 32G X 6 MM MISC Patient is to use one pen needle per day DX E11.9  100 each 3  . isosorbide mononitrate (IMDUR) 60 MG 24 hr tablet Take 60 mg by mouth daily.    Marland Kitchen linagliptin (TRADJENTA) 5 MG TABS tablet Take 1 tablet (5 mg total) by mouth daily. See primary care doctor for refills, follow up, etc. 90 tablet 1  . lisinopril (PRINIVIL,ZESTRIL) 20 MG tablet Take 1 tablet (20 mg total) by mouth daily. 90 tablet 3  . Omega-3 Fatty Acids (FISH OIL) 1000 MG CAPS Take 1,000 mg by mouth daily.     . ONE TOUCH ULTRA TEST test strip TEST twice a day 200 each 2  . ONETOUCH DELICA LANCETS 59D MISC TEST TWICE DAILY 200 each 3  . potassium chloride (K-DUR) 10 MEQ tablet Take 10 mEq by mouth daily.    . pravastatin (PRAVACHOL) 80 MG tablet take 1 tablet by mouth once daily 90 tablet 1  . psyllium (REGULOID) 0.52 g  capsule Take 0.52 g by mouth daily.    . silver sulfADIAZINE (SILVADENE) 1 % cream Apply 1 application topically daily as needed (LEG SORE).    . furosemide (LASIX) 40 MG tablet Take 1 tablet (40 mg total) by mouth daily. 90 tablet 3   No current facility-administered medications for this visit.     No Known Allergies  Review of Systems negative except from HPI and PMH  Physical Exam BP (!) 160/82   Pulse 80   Ht _0  (1.702 m)   Wt 220 lb (99.8 kg)   SpO2 93%   BMI 34.46 kg/m  Well developed and nourished in no acute distress HENT normal Neck supple with JVP-8 Carotids brisk and full without bruits Clear Regular rate and rhythm,2/6 m Abd-soft with active BS without hepatomegaly No Clubbing cyanosis 1+ edema Skin-warm and dry A & Oriented  Grossly normal sensory and motor function   ECG demonstrates AV pacing Assessment and  Plan  Complete heart block-pacemaker dependent   Pacemaker-Medtronic  The patient's device was interrogated.  The information was reviewed. No changes were made in the programming.    Hypertension   Cardiomyopathy interval improvement  chronic  Systolic diastolic systcongestive heart failure  Ventricular tachycardia-nonsustained  Diabetes    he continues to complain of shortness of breath. With interval improvement in LV function, I am reluctant to consider CRT upgrade. He has significant LVH and I suspect this is largely HFpEF. Hence, we will try to decrease his heart rate a little bit. We'll increase his carvedilol 6.25--12.5 and also try to decrease his blood pressure and will increase his lisinopril 20--twice a day.  He has concerns about his diabetic medications for cost and indeed for insulin. I told him I did not have the answers and suggested  that he follow-up with his PCP.

## 2017-02-19 NOTE — Patient Instructions (Signed)
Medication Instructions: - Your physician has recommended you make the following change in your medication:  1) Increase coreg (carvedilol) 12.5 mg- take 1 tablet by mouth TWICE daily 2) Increase Lisinopril 20 mg- take 1 tablet by mouth TWICE daily  Labwork: - none ordered  Procedures/Testing: - none ordered  Follow-Up: - Your physician recommends that you schedule a follow-up appointment in: 3 months with [name], PA for Dr. Caryl Comes.  - Remote monitoring is used to monitor your Pacemaker of ICD from home. This monitoring reduces the number of office visits required to check your device to one time per year. It allows [place] to keep an eye on the functioning of your device to ensure it is working properly. You are scheduled for a device check from home on 05/08/17. You may send your transmission at any time that day. If you have a wireless device, the transmission will be sent automatically. After your physician reviews your transmission, you will receive a postcard with your next transmission date.  - Your physician wants you to follow-up in: 1 year with Dr. Caryl Comes. You will receive a reminder letter in the mail two months in advance. If you don't receive a letter, please call our office to schedule the follow-up appointment.     Any Additional Special Instructions Will Be Listed Below (If Applicable).     If you need a refill on your cardiac medications before your next appointment, please call your pharmacy.

## 2017-02-20 LAB — CUP PACEART INCLINIC DEVICE CHECK
Battery Impedance: 302 Ohm
Battery Remaining Longevity: 87 mo
Battery Voltage: 2.79 V
Brady Statistic AP VP Percent: 38 %
Brady Statistic AP VS Percent: 0 %
Brady Statistic AS VP Percent: 62 %
Brady Statistic AS VS Percent: 0 %
Date Time Interrogation Session: 20180718162137
Implantable Lead Implant Date: 20051205
Implantable Lead Implant Date: 20051205
Implantable Lead Location: 753860
Implantable Pulse Generator Implant Date: 20140711
Lead Channel Impedance Value: 612 Ohm
Lead Channel Impedance Value: 715 Ohm
Lead Channel Pacing Threshold Amplitude: 0.5 V
Lead Channel Pacing Threshold Amplitude: 0.625 V
Lead Channel Pacing Threshold Pulse Width: 0.4 ms
Lead Channel Pacing Threshold Pulse Width: 0.4 ms
Lead Channel Pacing Threshold Pulse Width: 1.5 ms
Lead Channel Setting Pacing Amplitude: 2.5 V
Lead Channel Setting Pacing Amplitude: 2.5 V
Lead Channel Setting Sensing Sensitivity: 2.8 mV
MDC IDC LEAD LOCATION: 753859
MDC IDC MSMT LEADCHNL RA PACING THRESHOLD AMPLITUDE: 1 V
MDC IDC MSMT LEADCHNL RA SENSING INTR AMPL: 4 mV
MDC IDC SET LEADCHNL RV PACING PULSEWIDTH: 0.4 ms

## 2017-02-24 ENCOUNTER — Telehealth: Payer: Self-pay | Admitting: Internal Medicine

## 2017-02-24 NOTE — Telephone Encounter (Signed)
Lmtcb for Aaron Edelman, RN with [facility] to discuss medication adjustment. Per [name], PA recommendation to increase lasix 40 mg to BID x 2 days then resume lasix 40 mg daily, increase K+ 10 meq to BID x 2 days then resume K+ 10 meq daily. I asked for a call back to go over recommendations.

## 2017-02-24 NOTE — Telephone Encounter (Signed)
New message    Aaron Edelman RN from [facility] is calling for pt. He is faxing over a paper stating that pt has had 5 lb weight gain in 2 days. Weight today-219.8 7/22-214.

## 2017-02-24 NOTE — Telephone Encounter (Signed)
I s/w Aaron Edelman, RN with [facility] in regards to pt's weight gain of 5 lb's in 2 days. I went over recommendation per [name], PA to have pt increase lasix 40 mg daily to BID x 2 days then then resume lasix 40 mg daily; also to increase K+ to 10 meq daily to BID x 2 days then resume K+ 10 meq daily. I have a Lmtcb to go over these recommendations as well with the pt and his wife.

## 2017-02-24 NOTE — Telephone Encounter (Signed)
Increase Lasix and potassium to Twice daily for 2 days. [name], PA-C    02/24/2017 1:15 PM

## 2017-02-24 NOTE — Telephone Encounter (Signed)
Will forward to provider for direction

## 2017-02-25 NOTE — Telephone Encounter (Signed)
I s/w pt's wife today DPR on file. I stated I lmom yesterday to go over recommendations per [name], PAC about pt's weight gain 5 lb's x 2 days reported to our office per Aaron Edelman, RN with [facility]. I advised pt's wife of the recommendations to increase lasix 40 mg to BID x 2 days and increase the K+ 10 meq to BID x 2 days; after the 2 days pt will resume lasix 40 mg daily and K+ 10 meq daily. Pt's wife thanked me for the call today. I advised her to please call the office if they have any questions. Pt's wife is agreeable to plan of care.

## 2017-03-11 ENCOUNTER — Ambulatory Visit (INDEPENDENT_AMBULATORY_CARE_PROVIDER_SITE_OTHER): Payer: Medicare Other | Admitting: Family Medicine

## 2017-03-11 ENCOUNTER — Encounter: Payer: Self-pay | Admitting: Family Medicine

## 2017-03-11 VITALS — BP 140/80 | HR 69 | Ht 67.0 in | Wt 226.4 lb

## 2017-03-11 DIAGNOSIS — E669 Obesity, unspecified: Secondary | ICD-10-CM

## 2017-03-11 DIAGNOSIS — N401 Enlarged prostate with lower urinary tract symptoms: Secondary | ICD-10-CM

## 2017-03-11 DIAGNOSIS — R3912 Poor urinary stream: Secondary | ICD-10-CM

## 2017-03-11 DIAGNOSIS — C4492 Squamous cell carcinoma of skin, unspecified: Secondary | ICD-10-CM

## 2017-03-11 DIAGNOSIS — E118 Type 2 diabetes mellitus with unspecified complications: Secondary | ICD-10-CM | POA: Diagnosis not present

## 2017-03-11 DIAGNOSIS — Z0000 Encounter for general adult medical examination without abnormal findings: Secondary | ICD-10-CM | POA: Diagnosis not present

## 2017-03-11 DIAGNOSIS — E1159 Type 2 diabetes mellitus with other circulatory complications: Secondary | ICD-10-CM

## 2017-03-11 DIAGNOSIS — N3946 Mixed incontinence: Secondary | ICD-10-CM | POA: Diagnosis not present

## 2017-03-11 DIAGNOSIS — I10 Essential (primary) hypertension: Secondary | ICD-10-CM

## 2017-03-11 DIAGNOSIS — E1169 Type 2 diabetes mellitus with other specified complication: Secondary | ICD-10-CM

## 2017-03-11 DIAGNOSIS — N183 Chronic kidney disease, stage 3 unspecified: Secondary | ICD-10-CM

## 2017-03-11 DIAGNOSIS — Z794 Long term (current) use of insulin: Secondary | ICD-10-CM

## 2017-03-11 DIAGNOSIS — E785 Hyperlipidemia, unspecified: Secondary | ICD-10-CM | POA: Diagnosis not present

## 2017-03-11 DIAGNOSIS — I890 Lymphedema, not elsewhere classified: Secondary | ICD-10-CM | POA: Diagnosis not present

## 2017-03-11 DIAGNOSIS — I5042 Chronic combined systolic (congestive) and diastolic (congestive) heart failure: Secondary | ICD-10-CM | POA: Diagnosis not present

## 2017-03-11 DIAGNOSIS — E0821 Diabetes mellitus due to underlying condition with diabetic nephropathy: Secondary | ICD-10-CM

## 2017-03-11 LAB — POCT GLYCOSYLATED HEMOGLOBIN (HGB A1C): HEMOGLOBIN A1C: 7.7

## 2017-03-11 NOTE — Progress Notes (Signed)
Subjective:   HPI  Malik Rojas is a 83 y.o. male who presents for Chief Complaint  Patient presents with  . Medicare Wellness  . Annual Exam    Medical care team includes: Denita Lung, MD here for primary care  Dr.Klein Cardio. Preventative care: [employer] Last ophthalmology visit: 2017 Last dental visit:N/A Last colonoscopy:02/17/09 Last prostate exam:  Last EKG:02/19/17 Last labs: 07/23/16  Prior vaccinations:  TD or Tdap:05/12/09 Influenza:05/19/16 Pneumococcal: 23, 05/19/09, 06/12/07 13: 13/18/15 Shingles/Zostavax: 03/16/13 Other: shingrix : 11/14/16  Advanced directive: In the record  Concerns: He does have underlying diabetes and presently is only taking insulin. He stopped the Trajenta due to cost. When quizzed further about his medications he was slightly confused. He is taking Coreg as well as Prinivil and also taking pravastatin and apparently having no difficulty with them. He is not sure if he is taking the isosorbide. He has had no difficulty with chest pain, shortness of breath. He continues have difficulty with bilateral leg edema. He still does have difficulty with incontinence and is wearing a diaper but is not interested in pursuing any further evaluation and treatment. Apparently did have urology referral in the past and found it to be less than useful. He does have an eye appointment scheduled for September. He is also seen dermatology and apparently did have some skin cancers removed from his arms. His wife is his primary caregiver. A1c is 7.7 Reviewed their medical, surgical, family, social, medication, and allergy history and updated chart as appropriate.  Past Medical History:  Diagnosis Date  . Basal cell carcinoma    "scalp, arms, right face;  some cut, some burndt off"  . CAD (coronary artery disease)    LHC 3/14: Distal left main 10%, LAD 20%, circumflex 10-20%, proximal RCA 20%, distal 40-50%, EF 50-55%.  . Chronic combined systolic and  diastolic CHF, NYHA class 4 ([place]) 11/06/2016   Echo 4/18: severe LVH, EF 45-50, global HK, Gr 1 DD, mild AS (mean 9/peak 17), MAC, mod LAE, trivial eff  . Complete heart block (HCC)    Status post Medtronic pacemaker  . High cholesterol   . HTN (hypertension)   . Hx of cardiovascular stress test    a. [place] MV 3/14:  Low risk, EF 36%, apical inf scar, no ischemia, inf-apical HK>> catheterization however demonstrated normal left ventricular function  . Melanoma ([place])    back  . Obesity   . Pneumonia 1940  . Presence of permanent cardiac pacemaker    Medtronic  . Type II diabetes mellitus ([place])   . Urinary frequency   . Urinary incontinence     Past Surgical History:  Procedure Laterality Date  . APPENDECTOMY  40 years ago  . BASAL CELL CARCINOMA EXCISION     "scalp, right face"  . INSERT / REPLACE / REMOVE PACEMAKER  ~ 1999   medtronic EnRhythm  . MOHS SURGERY Left    ear; being monitored-every 6 months  . PERMANENT PACEMAKER GENERATOR CHANGE N/A 02/12/2013   Procedure: PERMANENT PACEMAKER GENERATOR CHANGE;  Surgeon: Deboraha Sprang, MD;  Location: [facility] CATH LAB;  Service: Cardiovascular;  Laterality: N/A;  . TONSILLECTOMY     as child  . TRANSURETHRAL RESECTION OF PROSTATE N/A 10/13/2014   Procedure: WOLF TRANSURETHRAL RESECTION OF THE PROSTATE (TURP);  Surgeon: Carolan Clines, MD;  Location: [facility] ORS;  Service: Urology;  Laterality: N/A;    Social History   Social History  . Marital status: Married    Spouse  name: Jolene  . Number of children: Y  . Years of education: N/A   Occupational History  . truck driver     retired   Social History Main Topics  . Smoking status: Former Smoker    Packs/day: 3.00    Years: 30.00    Quit date: 30  . Smokeless tobacco: Never Used  . Alcohol use Yes     Comment: 11/06/2016 "I'll have 1 drink/year, maybe"  . Drug use: No  . Sexual activity: Not Currently   Other Topics Concern  . Not on file   Social History Narrative  .  No narrative on file    Family History  Problem Relation Age of Onset  . Leukemia Mother      Current Outpatient Prescriptions:  .  aspirin 81 MG tablet, Take 81 mg by mouth daily., Disp: , Rfl:  .  carvedilol (COREG) 12.5 MG tablet, Take 1 tablet (12.5 mg total) by mouth 2 (two) times daily., Disp: 180 tablet, Rfl: 3 .  CINNAMON PO, Take 1,000 mg by mouth 2 (two) times daily as needed., Disp: , Rfl:  .  furosemide (LASIX) 40 MG tablet, Take 1 tablet (40 mg total) by mouth daily., Disp: 90 tablet, Rfl: 3 .  Insulin Glargine (TOUJEO MAX SOLOSTAR) 300 UNIT/ML SOPN, Inject 12 Units into the skin daily., Disp: 1 pen, Rfl: 5 .  Insulin Pen Needle (BD ULTRA-FINE MICRO PEN NEEDLE) 32G X 6 MM MISC, Patient is to use one pen needle per day DX E11.9, Disp: 100 each, Rfl: 3 .  isosorbide mononitrate (IMDUR) 60 MG 24 hr tablet, Take 60 mg by mouth daily., Disp: , Rfl:  .  lisinopril (PRINIVIL,ZESTRIL) 20 MG tablet, Take 1 tablet (20 mg total) by mouth 2 (two) times daily., Disp: 180 tablet, Rfl: 3 .  Omega-3 Fatty Acids (FISH OIL) 1000 MG CAPS, Take 1,000 mg by mouth daily. , Disp: , Rfl:  .  ONE TOUCH ULTRA TEST test strip, TEST twice a day, Disp: 200 each, Rfl: 2 .  ONETOUCH DELICA LANCETS 78E MISC, TEST TWICE DAILY, Disp: 200 each, Rfl: 3 .  potassium chloride (K-DUR) 10 MEQ tablet, Take 10 mEq by mouth daily., Disp: , Rfl:  .  pravastatin (PRAVACHOL) 80 MG tablet, take 1 tablet by mouth once daily, Disp: 90 tablet, Rfl: 1 .  psyllium (REGULOID) 0.52 g capsule, Take 0.52 g by mouth daily., Disp: , Rfl:  .  silver sulfADIAZINE (SILVADENE) 1 % cream, Apply 1 application topically daily as needed (LEG SORE)., Disp: , Rfl:   No Known Allergies     Review of Systems Negative except as above   Objective:    General appearance: alert, no distress, WD/WN, Caucasian male Skin: Extensive skin damage is noted on both forearms with recent surgical scars. HEENT: normocephalic, conjunctiva/corneas  normal, sclerae anicteric, PERRLA, EOMi, nares patent, no discharge or erythema, pharynx normal Oral cavity: MMM, tongue normal, teeth normal Neck: supple, no lymphadenopathy, no thyromegaly, no masses, normal ROM, no bruits Heart: RRR, normal S1, S2, no murmurs Lungs: CTA bilaterally, no wheezes, rhonchi, or rales Abdomen: +bs, soft, non tender, non distended, no masses, no hepatomegaly, no splenomegaly, no bruits Back: non tender, normal ROM, no scoliosis Musculoskeletal: upper extremities non tender, no obvious deformity, normal ROM throughout, lower extremities non tender, no obvious deformity, normal ROM throughout Extremities: Both lower extremities show extensive edema no cyanosis, no clubbing. He does have support stockings on. Pulses: 2+ symmetric, upper and lower extremities, normal  cap refill Neurological: alert, oriented x 3, CN2-12 intact, strength normal upper extremities and lower extremities, sensation normal throughout, DTRs 2+ throughout, no cerebellar signs, gait normal Psychiatric: normal affect, behavior normal, pleasant    Assessment and Plan :   Diabetes mellitus due to underlying condition with diabetic nephropathy, without long-term current use of insulin (HCC)  Chronic acquired lymphedema  Hypertension associated with diabetes ([place])  Hyperlipidemia associated with type 2 diabetes mellitus (HCC)  Obesity (BMI 30-39.9)  Benign prostatic hyperplasia with weak urinary stream  Chronic kidney disease, stage III (moderate)  Chronic combined systolic and diastolic CHF (congestive heart failure) (HCC)  Controlled type 2 diabetes mellitus with complication, with long-term current use of insulin (HCC)  Mixed incontinence  Squamous cell carcinoma of skin Overall he is doing quite well on his present medications. I did recommend that they always bring in all her medications to eliminate any confusion on exactly what is being taken. He is to continue on his present  medication regimen. He is treating his incontinence with diapers and he and his wife are both comfortable with that. He will continue with daily weights. He was given a scale by the insurance coming. He seems to be stable on the above diagnoses with the present medication regimen. He is to continue use the support stockings.

## 2017-03-20 DIAGNOSIS — L905 Scar conditions and fibrosis of skin: Secondary | ICD-10-CM | POA: Diagnosis not present

## 2017-03-20 DIAGNOSIS — C44622 Squamous cell carcinoma of skin of right upper limb, including shoulder: Secondary | ICD-10-CM | POA: Diagnosis not present

## 2017-03-25 ENCOUNTER — Encounter: Payer: Self-pay | Admitting: Family Medicine

## 2017-03-25 DIAGNOSIS — H35032 Hypertensive retinopathy, left eye: Secondary | ICD-10-CM | POA: Insufficient documentation

## 2017-03-28 ENCOUNTER — Encounter: Payer: Self-pay | Admitting: Family Medicine

## 2017-04-03 ENCOUNTER — Telehealth: Payer: Self-pay

## 2017-04-03 ENCOUNTER — Other Ambulatory Visit: Payer: Self-pay | Admitting: Internal Medicine

## 2017-04-03 ENCOUNTER — Other Ambulatory Visit: Payer: Self-pay

## 2017-04-03 DIAGNOSIS — E0821 Diabetes mellitus due to underlying condition with diabetic nephropathy: Secondary | ICD-10-CM

## 2017-04-03 MED ORDER — PRAVASTATIN SODIUM 80 MG PO TABS: 80.0000 mg | ORAL_TABLET | Freq: Every day | ORAL | 1 refills | Status: AC

## 2017-04-03 MED ORDER — FUROSEMIDE 40 MG PO TABS: 40.0000 mg | ORAL_TABLET | Freq: Every day | ORAL | 3 refills | Status: DC

## 2017-04-03 MED ORDER — LISINOPRIL 20 MG PO TABS: 20.0000 mg | ORAL_TABLET | Freq: Two times a day (BID) | ORAL | 3 refills | Status: DC

## 2017-04-03 MED ORDER — GLUCOSE BLOOD VI STRP: ORAL_STRIP | 2 refills | Status: DC

## 2017-04-03 MED ORDER — ISOSORBIDE MONONITRATE ER 60 MG PO TB24: 60.0000 mg | ORAL_TABLET | Freq: Every day | ORAL | 3 refills | Status: DC

## 2017-04-03 MED ORDER — ONETOUCH DELICA LANCETS 33G MISC: 3 refills | Status: DC

## 2017-04-03 MED ORDER — INSULIN PEN NEEDLE 32G X 6 MM MISC: 3 refills | Status: DC

## 2017-04-03 MED ORDER — CARVEDILOL 12.5 MG PO TABS: 12.5000 mg | ORAL_TABLET | Freq: Two times a day (BID) | ORAL | 3 refills | Status: DC

## 2017-04-03 MED ORDER — INSULIN GLARGINE 300 UNIT/ML ~~LOC~~ SOPN: 12.0000 [IU] | PEN_INJECTOR | Freq: Every day | SUBCUTANEOUS | 3 refills | Status: DC

## 2017-04-03 NOTE — Telephone Encounter (Signed)
SENT IN

## 2017-04-03 NOTE — Telephone Encounter (Signed)
Fax request rcvd from [facility] for Tradjenta,  Toujeo, Pravastatin, Pen Needles, Lancets and One Touch Ultra Test Strips. Malik Rojas

## 2017-04-08 ENCOUNTER — Telehealth: Payer: Self-pay | Admitting: Family Medicine

## 2017-04-08 NOTE — Telephone Encounter (Signed)
[facility] req Tradjenta Tab

## 2017-04-08 NOTE — Telephone Encounter (Signed)
He stopped it due to cost

## 2017-04-08 NOTE — Telephone Encounter (Signed)
Faxed reply to [facility] stating pt is no longer taking medications. Victorino December

## 2017-04-08 NOTE — Telephone Encounter (Signed)
Refill request came in for Tradjenta, however this was taken off of pt's med list on 03/11/2017. Please advise if pt is supposed to be on this medication. Thank you, RLB

## 2017-05-08 ENCOUNTER — Encounter: Payer: Medicare Other | Admitting: *Deleted

## 2017-05-08 ENCOUNTER — Telehealth: Payer: Self-pay | Admitting: Cardiology

## 2017-05-08 NOTE — Telephone Encounter (Signed)
Confirmed remote transmission w/ pt.   

## 2017-05-14 ENCOUNTER — Ambulatory Visit (INDEPENDENT_AMBULATORY_CARE_PROVIDER_SITE_OTHER): Payer: Medicare Other | Admitting: *Deleted

## 2017-05-14 DIAGNOSIS — I442 Atrioventricular block, complete: Secondary | ICD-10-CM

## 2017-05-15 LAB — CUP PACEART REMOTE DEVICE CHECK
Battery Voltage: 2.78 V
Brady Statistic AP VP Percent: 79 %
Brady Statistic AP VS Percent: 0 %
Brady Statistic AS VP Percent: 21 %
Date Time Interrogation Session: 20181010120630
Implantable Lead Implant Date: 20051205
Implantable Lead Location: 753859
Implantable Lead Location: 753860
Lead Channel Impedance Value: 589 Ohm
Lead Channel Pacing Threshold Amplitude: 0.5 V
Lead Channel Pacing Threshold Pulse Width: 0.4 ms
Lead Channel Setting Pacing Amplitude: 2.5 V
Lead Channel Setting Sensing Sensitivity: 2.8 mV
MDC IDC LEAD IMPLANT DT: 20051205
MDC IDC MSMT BATTERY IMPEDANCE: 351 Ohm
MDC IDC MSMT BATTERY REMAINING LONGEVITY: 73 mo
MDC IDC MSMT LEADCHNL RV IMPEDANCE VALUE: 791 Ohm
MDC IDC PG IMPLANT DT: 20140711
MDC IDC SET LEADCHNL RA PACING AMPLITUDE: 2.5 V
MDC IDC SET LEADCHNL RV PACING PULSEWIDTH: 0.4 ms
MDC IDC STAT BRADY AS VS PERCENT: 0 %

## 2017-05-15 NOTE — Progress Notes (Signed)
Remote pacemaker transmission.   

## 2017-05-16 ENCOUNTER — Encounter: Payer: Self-pay | Admitting: Cardiology

## 2017-05-23 ENCOUNTER — Telehealth: Payer: Self-pay | Admitting: *Deleted

## 2017-05-23 ENCOUNTER — Encounter: Payer: Self-pay | Admitting: Physician Assistant

## 2017-05-23 ENCOUNTER — Ambulatory Visit (INDEPENDENT_AMBULATORY_CARE_PROVIDER_SITE_OTHER): Payer: Medicare Other | Admitting: Physician Assistant

## 2017-05-23 VITALS — BP 162/84 | HR 84 | Ht 68.0 in | Wt 229.0 lb

## 2017-05-23 DIAGNOSIS — N183 Chronic kidney disease, stage 3 unspecified: Secondary | ICD-10-CM

## 2017-05-23 DIAGNOSIS — I5042 Chronic combined systolic (congestive) and diastolic (congestive) heart failure: Secondary | ICD-10-CM | POA: Diagnosis not present

## 2017-05-23 DIAGNOSIS — I110 Hypertensive heart disease with heart failure: Secondary | ICD-10-CM | POA: Diagnosis not present

## 2017-05-23 LAB — BASIC METABOLIC PANEL
BUN/Creatinine Ratio: 16 (ref 10–24)
BUN: 28 mg/dL — AB (ref 8–27)
CALCIUM: 8.8 mg/dL (ref 8.6–10.2)
CO2: 27 mmol/L (ref 20–29)
Chloride: 101 mmol/L (ref 96–106)
Creatinine, Ser: 1.71 mg/dL — ABNORMAL HIGH (ref 0.76–1.27)
GFR calc Af Amer: 42 mL/min/{1.73_m2} — ABNORMAL LOW (ref >59)
GFR, EST NON AFRICAN AMERICAN: 36 mL/min/{1.73_m2} — AB (ref >59)
Glucose: 182 mg/dL — ABNORMAL HIGH (ref 65–99)
Potassium: 4.3 mmol/L (ref 3.5–5.2)
Sodium: 143 mmol/L (ref 134–144)

## 2017-05-23 NOTE — Patient Instructions (Signed)
Medication Instructions:  Stop using Alka Seltzer. For a cold you can use: Claritin (antihistamine) to dry up secretions - take 10 mg Once daily as needed Flonase - to open up your nasal passages - take 1 spray each nostril Once daily for 3-4 weeks Tylenol - take 650 mg every 6 hours as needed for pain  Instead of Claritin you can look for "Coricidin" - ask the pharmacist  Labwork: Today - BMET  Testing/Procedures: None   Follow-Up: [name], PA-C in 3 months.  Any Other Special Instructions Will Be Listed Below (If Applicable). Check blood pressure 3-4 times a week and call with readings after 2 weeks. Call if weight goes up 3 lbs in 1 day.  If you need a refill on your cardiac medications before your next appointment, please call your pharmacy.

## 2017-05-23 NOTE — Telephone Encounter (Signed)
DPR on file filled out wrong and has pt's name where supposed to be his wife's name, in the relationship area says wife, so you can tell pt is giving permission, just that form was filled out wrong. New DPR should be put on file to correct. I s/w pt's wife today who has been notified of pt's lab results. Pt's wife is aware to continue with Tx planned as outlined today. Pt's wife said she took BP after they got home and said "BP was 150 or something." I did tell her that the 150 was still high. Wife began saying there was "no need for [name], PA to increase pt's BP med's and screw up his BP." She states BP is fine and that his BP goes up whenever he see's a BP cuff." Pt's wife also states "Scott screwed up pt's blood sugar by putting him on insulin when the Mr. Main was in the hospital and he was not on insulin. States Nicki Reaper stopped his Janumet and put him on insulin." I advised pt's wife Nicki Reaper treated the pt in the hospital appropriately and if pt is still having problems with his blood sugar he should be following up with the doctor who is taking care of that. Pt's wife thanked me for the call.

## 2017-05-23 NOTE — Telephone Encounter (Signed)
-----   Message from Liliane Shi, [place] sent at 05/23/2017  5:05 PM EDT ----- Please call the patient Renal function stable.  Continue current medications and follow-up as planned. [name], PA-C 05/23/2017 5:05 PM

## 2017-05-23 NOTE — Progress Notes (Signed)
Cardiology Office Note:    Date:  05/23/2017   ID:  [name], DOB 1931/12/16, MRN [id number]  PCP:  Denita Lung, MD  Cardiologist:  Dr. Virl Axe    Referring MD: Denita Lung, MD   Chief Complaint  Patient presents with  . Follow-up    Hypertension, heart failure    History of Present Illness:    Malik Rojas is a 83 y.o. male with a hx of non-obstructive CAD, HTN, DM2, HL, combined systolic and diastolic CHF, high grade HB, s/p pacer. EF has been 35-40% in the past. LHC in 3/14: Distal left main 10%, LAD 20%, circumflex 10-20%, proximal RCA 20%, distal 40-50%, EF 50-55%.  He was admitted from the office in April 2018 with decompensated heart failure.  Echocardiogram demonstrated EF 50-53, mild diastolic dysfunction and mild aortic stenosis.  He was last seen by Dr. Caryl Comes in July 2018.  His beta-blocker and ACE inhibitor were both adjusted at that time for better blood pressure control.     [name] returns for follow-up on hypertension and heart failure.  He is here with his wife.  Since last seen, he has not had chest discomfort.  He denies any significant change in his shortness of breath.  He sleeps on one pillow.  He denies paroxysmal nocturnal dyspnea.  He has chronic lower extremity edema.  He wears compression stockings.  His edema has not changed.  He denies syncope.  Recently, he has had cold symptoms.  He has been using Alka-Seltzer.  Prior CV studies:   The following studies were reviewed today:  Echo 4/18 Severe LVH, EF 45-50, global HK, grade 1 diastolic dysfunction, mild aortic stenosis (mean 9, peak 17), MAC, trivial MR, moderate LAE, trivial pericardial effusion  Past Medical History:  Diagnosis Date  . Basal cell carcinoma    "scalp, arms, right face;  some cut, some burndt off"  . CAD (coronary artery disease)    LHC 3/14: Distal left main 10%, LAD 20%, circumflex 10-20%, proximal RCA 20%, distal 40-50%, EF 50-55%.  . Chronic  combined systolic and diastolic CHF, NYHA class 4 ([place]) 11/06/2016   Echo 4/18: severe LVH, EF 45-50, global HK, Gr 1 DD, mild AS (mean 9/peak 17), MAC, mod LAE, trivial eff  . Complete heart block (HCC)    Status post Medtronic pacemaker  . High cholesterol   . HTN (hypertension)   . Hx of cardiovascular stress test    a. [place] MV 3/14:  Low risk, EF 36%, apical inf scar, no ischemia, inf-apical HK>> catheterization however demonstrated normal left ventricular function  . Melanoma ([place])    back  . Obesity   . Pneumonia 1940  . Presence of permanent cardiac pacemaker    Medtronic  . Type II diabetes mellitus ([place])   . Urinary frequency   . Urinary incontinence     Past Surgical History:  Procedure Laterality Date  . APPENDECTOMY  40 years ago  . BASAL CELL CARCINOMA EXCISION     "scalp, right face"  . INSERT / REPLACE / REMOVE PACEMAKER  ~ 1999   medtronic EnRhythm  . MOHS SURGERY Left    ear; being monitored-every 6 months  . PERMANENT PACEMAKER GENERATOR CHANGE N/A 02/12/2013   Procedure: PERMANENT PACEMAKER GENERATOR CHANGE;  Surgeon: Deboraha Sprang, MD;  Location[facility] LAB;  Service: Cardiovascular;  Laterality: N/A;  . TONSILLECTOMY     as child  . TRANSURETHRAL RESECTION OF PROSTATE N/A 10/13/2014  Procedure: WOLF TRANSURETHRAL RESECTION OF THE PROSTATE (TURP);  Surgeon: Carolan Clines, MD;  Location: [facility] ORS;  Service: Urology;  Laterality: N/A;    Current Medications: Current Meds  Medication Sig  . aspirin 81 MG tablet Take 81 mg by mouth daily.  . carvedilol (COREG) 12.5 MG tablet Take 1 tablet (12.5 mg total) by mouth 2 (two) times daily.  Marland Kitchen CINNAMON PO Take 1,000 mg by mouth 2 (two) times daily as needed.  . furosemide (LASIX) 40 MG tablet Take 1 tablet (40 mg total) by mouth daily.  Marland Kitchen glucose blood (ONE TOUCH ULTRA TEST) test strip TEST twice a day DX E11.9  . Insulin Glargine (TOUJEO MAX SOLOSTAR) 300 UNIT/ML SOPN Inject 12 Units into the skin daily.  .  Insulin Pen Needle (BD ULTRA-FINE MICRO PEN NEEDLE) 32G X 6 MM MISC Patient is to use one pen needle per day DX E11.9  . isosorbide mononitrate (IMDUR) 60 MG 24 hr tablet Take 1 tablet (60 mg total) by mouth daily.  Marland Kitchen lisinopril (PRINIVIL,ZESTRIL) 20 MG tablet Take 1 tablet (20 mg total) by mouth 2 (two) times daily.  . Omega-3 Fatty Acids (FISH OIL) 1000 MG CAPS Take 1,000 mg by mouth daily.   Glory Rosebush DELICA LANCETS 01V MISC TEST TWICE DAILY DX E11.9  . potassium chloride (K-DUR) 10 MEQ tablet Take 10 mEq by mouth daily.  . pravastatin (PRAVACHOL) 80 MG tablet Take 1 tablet (80 mg total) by mouth daily.  . psyllium (REGULOID) 0.52 g capsule Take 0.52 g by mouth daily.  . Pumpkin Seed-Soy Germ (AZO BLADDER CONTROL/GO-LESS) CAPS Take 1 capsule by mouth daily.  . silver sulfADIAZINE (SILVADENE) 1 % cream Apply 1 application topically daily as needed (LEG SORE).     Allergies:   Patient has no known allergies.   Social History   Social History  . Marital status: Married    Spouse name: Jolene  . Number of children: Y  . Years of education: N/A   Occupational History  . truck driver     retired   Social History Main Topics  . Smoking status: Former Smoker    Packs/day: 3.00    Years: 30.00    Quit date: 65  . Smokeless tobacco: Never Used  . Alcohol use Yes     Comment: 11/06/2016 "I'll have 1 drink/year, maybe"  . Drug use: No  . Sexual activity: Not Currently   Other Topics Concern  . None   Social History Narrative  . None     Family Hx: The patient's family history includes Leukemia in his mother.  ROS:   Please see the history of present illness.    ROS All other systems reviewed and are negative.   EKGs/Labs/Other Test Reviewed:    EKG:  EKG is not ordered today.    Recent Labs: 11/06/2016: ALT 13; B Natriuretic Peptide 425.0; Hemoglobin 14.4; Magnesium 1.8; Platelets 186; TSH 1.716 11/25/2016: BUN 28; Creatinine, Ser 1.58; Potassium 4.4; Sodium 143    Recent Lipid Panel Lab Results  Component Value Date/Time   CHOL 134 07/23/2016 09:45 AM   TRIG 211 (H) 07/23/2016 09:45 AM   HDL 39 (L) 07/23/2016 09:45 AM   CHOLHDL 3.4 07/23/2016 09:45 AM   LDLCALC 53 07/23/2016 09:45 AM    Physical Exam:    VS:  BP (!) 162/84   Pulse 84   Ht _0  (1.727 m)   Wt 229 lb (103.9 kg)   BMI 34.82 kg/m  Wt Readings from Last 3 Encounters:  05/23/17 229 lb (103.9 kg)  03/11/17 226 lb 6.4 oz (102.7 kg)  02/19/17 220 lb (99.8 kg)     Physical Exam  Constitutional: He is oriented to person, place, and time. He appears well-developed and well-nourished. No distress.  HENT:  Head: Normocephalic and atraumatic.  Neck: No JVD (I cannot appreciate JVD) present.  Cardiovascular: Normal rate and regular rhythm.   No murmur heard. Pulmonary/Chest: Effort normal. He has no rales.  Abdominal: Soft.  Musculoskeletal: He exhibits edema (Tight 2+ bilateral LE edema; compression stockings in place).  Neurological: He is alert and oriented to person, place, and time.  Skin: Skin is warm and dry.  Psychiatric: He has a normal mood and affect.    ASSESSMENT:    1. Chronic combined systolic and diastolic CHF (congestive heart failure) ([place])   2. Hypertensive heart disease with chronic combined systolic and diastolic congestive heart failure ([place])   3. Chronic kidney disease, stage III (moderate) (HCC)    PLAN:    In order of problems listed above:  1.  Chronic combined systolic and diastolic CHF (congestive heart failure) (HCC) Overall, his volume appears stable.  His weights at home have remained unchanged.  Continue current medical regimen.  Obtain follow-up basic metabolic panel today.  2. Hypertensive heart disease with chronic combined systolic and diastolic congestive heart failure ([place]) Blood pressure is uncontrolled.  However, he has been using over-the-counter cold medications.  I have asked him to stop this.  I have given him a list of  medications he can use for his cold symptoms.  I have asked him to monitor his blood pressure over the next couple of weeks and send me some readings.  If his blood pressure is still above target, I will increase his carvedilol to 25 mg twice daily.  Follow-up with me in 3 months.  3. Chronic kidney disease, stage III (moderate) (HCC) Obtain follow-up basic metabolic panel today.   Dispo:  Return in about 3 months (around 08/23/2017) for Routine Follow Up, w/ [name], PA-C.   Medication Adjustments/Labs and Tests Ordered: Current medicines are reviewed at length with the patient today.  Concerns regarding medicines are outlined above.  Tests Ordered: Orders Placed This Encounter  Procedures  . Basic Metabolic Panel (BMET)   Medication Changes: No orders of the defined types were placed in this encounter.   Signed, [name], PA-C  05/23/2017 1:50 PM    [facility] [place], [place], [place]  [address] Phone: [phone]; Fax: [phone]

## 2017-05-26 NOTE — Telephone Encounter (Signed)
I s/w pt's wife today. I advised per [name], PA recommendation to monitor BP over the 2 weeks and call/send readings. Wife was advised during my call with her on Friday pt should f/u with PCP for DM. Pt's wife thanked me for my call today. Wife states they are agreeable to plan of care.

## 2017-05-26 NOTE — Telephone Encounter (Signed)
We do not manage his diabetes.  Diabetic medication changes were likely done by his PCP.   I recommend the patient monitor his blood pressure over the next couple of weeks and send me readings. [name], PA-C 05/26/2017 9:16 AM

## 2017-06-06 ENCOUNTER — Telehealth: Payer: Self-pay | Admitting: Physician Assistant

## 2017-06-06 NOTE — Telephone Encounter (Signed)
I will forward BP readings to [place] for his review.

## 2017-06-06 NOTE — Telephone Encounter (Signed)
New Message     Pt c/o BP issue: STAT if pt c/o blurred vision, one-sided weakness or slurred speech  1. What are your last 5 BP readings? 150/84 77 164/86 79  160/80 78  160/81 77  191/99 88  158/62 69   155/70 69 11/2  175/75 66  2. Are you having any other symptoms (ex. Dizziness, headache, blurred vision, passed out)? no  3. What is your BP issue? Was suppose to let Nicki Reaper know what his bp was

## 2017-06-08 NOTE — Telephone Encounter (Signed)
Increase Coreg to 25 mg Twice daily  [name], PA-C    06/08/2017 8:56 AM

## 2017-06-09 NOTE — Telephone Encounter (Signed)
Left message in regards to BP readings. Per [name], AP to increase Coreg to 25 mg BID.

## 2017-06-10 MED ORDER — CARVEDILOL 25 MG PO TABS: 25.0000 mg | ORAL_TABLET | Freq: Two times a day (BID) | ORAL | 3 refills | Status: AC

## 2017-06-10 NOTE — Telephone Encounter (Signed)
DPR on file ok to s/w pt's wife. Mrs. Malik Rojas advised pt's BP readings are too high. Per [name], PA we will increase Coreg to 25 mg BID, new Rx has been sent in. Advised to continue to monitor BP over the next few weeks and call or send readings. Advised to call the office sooner if SBP is 100 or < or if Diastolic BP is 60 or <. Pt's wife is agreeable to plan of care. Pt's wife thanked me for my call today.

## 2017-07-07 ENCOUNTER — Ambulatory Visit: Payer: Medicare Other | Admitting: Medical

## 2017-07-07 ENCOUNTER — Encounter: Payer: Self-pay | Admitting: Family Medicine

## 2017-07-07 ENCOUNTER — Ambulatory Visit (INDEPENDENT_AMBULATORY_CARE_PROVIDER_SITE_OTHER): Payer: Medicare Other | Admitting: Family Medicine

## 2017-07-07 VITALS — BP 130/70 | HR 76 | Resp 16 | Wt 229.8 lb

## 2017-07-07 DIAGNOSIS — R358 Other polyuria: Secondary | ICD-10-CM | POA: Diagnosis not present

## 2017-07-07 DIAGNOSIS — L03115 Cellulitis of right lower limb: Secondary | ICD-10-CM

## 2017-07-07 DIAGNOSIS — E0821 Diabetes mellitus due to underlying condition with diabetic nephropathy: Secondary | ICD-10-CM

## 2017-07-07 DIAGNOSIS — I890 Lymphedema, not elsewhere classified: Secondary | ICD-10-CM

## 2017-07-07 DIAGNOSIS — R3589 Other polyuria: Secondary | ICD-10-CM

## 2017-07-07 LAB — POCT URINALYSIS DIPSTICK
Bilirubin, UA: NEGATIVE
GLUCOSE UA: NEGATIVE
Ketones, UA: NEGATIVE
Leukocytes, UA: NEGATIVE
Nitrite, UA: NEGATIVE
SPEC GRAV UA: 1.02 (ref 1.010–1.025)
UROBILINOGEN UA: NEGATIVE U/dL — AB
pH, UA: 6 (ref 5.0–8.0)

## 2017-07-07 MED ORDER — SULFAMETHOXAZOLE-TRIMETHOPRIM 800-160 MG PO TABS: 1.0000 | ORAL_TABLET | Freq: Two times a day (BID) | ORAL | 0 refills | Status: DC

## 2017-07-07 NOTE — Progress Notes (Signed)
   Subjective:    Patient ID: Malik Rojas, male    DOB: July 11, 1932, 83 y.o.   MRN: [id number]  HPI He is here with his wife for consult concerning slightly increased shortness of breath as well as a 1 day history of polyuria as well as fatigue.  No chest pain.  She also states that his abdomen seems to be slightly larger than normal.  She is also noted more swelling and erythema to the proximal aspect of the right calf area.  He does have a previous history of lymphedema.  He also has a diagnosis of diabetes but this does not seem to be giving him much trouble at the present time.   Review of Systems     Objective:   Physical Exam Alert and in no distress but does appear to be slightly fatigued.  Cardiac exam shows a regular rhythm without murmurs or gallops.  Lungs are clear to auscultation.  Abdominal exam difficult to do due to his large girth.  Rectal was deferred.  Urinalysis was normal.  Exam of the right lower extremity does show proximal erythema and almost a horizontal pattern with distal edema but less erythema.  I did have his wife take a picture of this.       Assessment & Plan:  Polyuria - Plan: Urinalysis Dipstick  Diabetes mellitus due to underlying condition with diabetic nephropathy, without long-term current use of insulin (HCC)  Chronic acquired lymphedema - Plan: sulfamethoxazole-trimethoprim (BACTRIM DS,SEPTRA DS) 800-160 MG tablet  Cellulitis of right lower extremity  I think this is a mixed picture of polyuria probably related to prostate as well as some cellulitis.  I will place him on Septra and follow-up in roughly 1 week.

## 2017-07-14 ENCOUNTER — Ambulatory Visit: Payer: Medicare Other | Admitting: Family Medicine

## 2017-07-15 ENCOUNTER — Ambulatory Visit: Payer: Medicare Other | Admitting: Family Medicine

## 2017-07-17 ENCOUNTER — Other Ambulatory Visit: Payer: Self-pay | Admitting: Family Medicine

## 2017-07-22 ENCOUNTER — Ambulatory Visit (INDEPENDENT_AMBULATORY_CARE_PROVIDER_SITE_OTHER): Payer: Medicare Other | Admitting: Family Medicine

## 2017-07-22 ENCOUNTER — Encounter: Payer: Self-pay | Admitting: Family Medicine

## 2017-07-22 VITALS — BP 128/80 | HR 80 | Ht 68.0 in | Wt 232.0 lb

## 2017-07-22 DIAGNOSIS — I890 Lymphedema, not elsewhere classified: Secondary | ICD-10-CM

## 2017-07-22 DIAGNOSIS — E1169 Type 2 diabetes mellitus with other specified complication: Secondary | ICD-10-CM | POA: Diagnosis not present

## 2017-07-22 DIAGNOSIS — E1159 Type 2 diabetes mellitus with other circulatory complications: Secondary | ICD-10-CM

## 2017-07-22 DIAGNOSIS — L03115 Cellulitis of right lower limb: Secondary | ICD-10-CM

## 2017-07-22 DIAGNOSIS — E0821 Diabetes mellitus due to underlying condition with diabetic nephropathy: Secondary | ICD-10-CM | POA: Diagnosis not present

## 2017-07-22 DIAGNOSIS — I10 Essential (primary) hypertension: Secondary | ICD-10-CM

## 2017-07-22 DIAGNOSIS — E785 Hyperlipidemia, unspecified: Secondary | ICD-10-CM | POA: Diagnosis not present

## 2017-07-22 DIAGNOSIS — I152 Hypertension secondary to endocrine disorders: Secondary | ICD-10-CM

## 2017-07-22 DIAGNOSIS — E669 Obesity, unspecified: Secondary | ICD-10-CM | POA: Diagnosis not present

## 2017-07-22 LAB — POCT GLYCOSYLATED HEMOGLOBIN (HGB A1C): Hemoglobin A1C: 8

## 2017-07-22 NOTE — Addendum Note (Signed)
Addended by: Caprice Beaver T on: 07/22/2017 03:37 PM   Modules accepted: Orders

## 2017-07-22 NOTE — Progress Notes (Signed)
   Subjective:    Patient ID: Malik Rojas, male    DOB: March 26, 1932, 83 y.o.   MRN: [id number]  HPI He is here for a recheck on his legs as well as diabetes.  He is finished the course of the antibiotics and his wife thinks that his legs are doing much better.  He recently increased his insulin dosing to 15 because his blood sugars were going in the wrong direction.  He is on several cardiac/blood pressure medications and having no difficulty with them.  He also is taking Pravachol and having no aches or pains with that.  Continues on lisinopril.  He does not exercise regularly and his diet has not changed.  Does not smoke or drink.  His wife is his primary caregiver.  Alert and in no distress.  Exam of his legs does show   Review of Systems     Objective:   Physical Exam Chronic skin changes but it is less erythematous not warm or tender. Hemoglobin A1c is 8.0       Assessment & Plan:  Diabetes mellitus due to underlying condition with diabetic nephropathy, without long-term current use of insulin (HCC)  Chronic acquired lymphedema  Cellulitis of right lower extremity  Hypertension associated with diabetes ([place])  Hyperlipidemia associated with type 2 diabetes mellitus (HCC)  Obesity (BMI 30-39.9)  Overall I think he is fairly stable but did recommend he increase his insulin dosing to 17 units.  He will continue on his present medication regimen.  I will not add any more antibiotics as he seems to be doing well.  Encouraged him to continue to elevate his feet as much as possible.  Continue on his present medication regimen.

## 2017-08-11 ENCOUNTER — Inpatient Hospital Stay (HOSPITAL_COMMUNITY)
Admission: EM | Admit: 2017-08-11 | Discharge: 2017-08-26 | DRG: 291 | Disposition: A | Discharge status: Rehabilitation Facility | Payer: Medicare Other | Attending: Internal Medicine | Admitting: Internal Medicine

## 2017-08-11 ENCOUNTER — Emergency Department (HOSPITAL_COMMUNITY): Payer: Medicare Other

## 2017-08-11 ENCOUNTER — Encounter (HOSPITAL_COMMUNITY): Payer: Self-pay | Admitting: *Deleted

## 2017-08-11 ENCOUNTER — Other Ambulatory Visit: Payer: Self-pay

## 2017-08-11 ENCOUNTER — Inpatient Hospital Stay (HOSPITAL_COMMUNITY): Payer: Medicare Other

## 2017-08-11 DIAGNOSIS — G931 Anoxic brain damage, not elsewhere classified: Secondary | ICD-10-CM | POA: Diagnosis not present

## 2017-08-11 DIAGNOSIS — E1122 Type 2 diabetes mellitus with diabetic chronic kidney disease: Secondary | ICD-10-CM | POA: Diagnosis not present

## 2017-08-11 DIAGNOSIS — N183 Chronic kidney disease, stage 3 unspecified: Secondary | ICD-10-CM | POA: Diagnosis present

## 2017-08-11 DIAGNOSIS — I4901 Ventricular fibrillation: Secondary | ICD-10-CM | POA: Diagnosis present

## 2017-08-11 DIAGNOSIS — I5043 Acute on chronic combined systolic (congestive) and diastolic (congestive) heart failure: Secondary | ICD-10-CM | POA: Diagnosis present

## 2017-08-11 DIAGNOSIS — I82421 Acute embolism and thrombosis of right iliac vein: Secondary | ICD-10-CM | POA: Diagnosis not present

## 2017-08-11 DIAGNOSIS — K5900 Constipation, unspecified: Secondary | ICD-10-CM | POA: Diagnosis not present

## 2017-08-11 DIAGNOSIS — Z9089 Acquired absence of other organs: Secondary | ICD-10-CM

## 2017-08-11 DIAGNOSIS — D72829 Elevated white blood cell count, unspecified: Secondary | ICD-10-CM | POA: Diagnosis not present

## 2017-08-11 DIAGNOSIS — Z794 Long term (current) use of insulin: Secondary | ICD-10-CM

## 2017-08-11 DIAGNOSIS — J9690 Respiratory failure, unspecified, unspecified whether with hypoxia or hypercapnia: Secondary | ICD-10-CM | POA: Diagnosis not present

## 2017-08-11 DIAGNOSIS — I509 Heart failure, unspecified: Secondary | ICD-10-CM | POA: Diagnosis not present

## 2017-08-11 DIAGNOSIS — R570 Cardiogenic shock: Secondary | ICD-10-CM | POA: Diagnosis present

## 2017-08-11 DIAGNOSIS — Z7982 Long term (current) use of aspirin: Secondary | ICD-10-CM

## 2017-08-11 DIAGNOSIS — J9602 Acute respiratory failure with hypercapnia: Secondary | ICD-10-CM | POA: Diagnosis present

## 2017-08-11 DIAGNOSIS — E1151 Type 2 diabetes mellitus with diabetic peripheral angiopathy without gangrene: Secondary | ICD-10-CM | POA: Diagnosis not present

## 2017-08-11 DIAGNOSIS — Z6830 Body mass index (BMI) 30.0-30.9, adult: Secondary | ICD-10-CM | POA: Diagnosis not present

## 2017-08-11 DIAGNOSIS — N184 Chronic kidney disease, stage 4 (severe): Secondary | ICD-10-CM | POA: Diagnosis not present

## 2017-08-11 DIAGNOSIS — E785 Hyperlipidemia, unspecified: Secondary | ICD-10-CM | POA: Diagnosis not present

## 2017-08-11 DIAGNOSIS — Z515 Encounter for palliative care: Secondary | ICD-10-CM | POA: Diagnosis not present

## 2017-08-11 DIAGNOSIS — E1121 Type 2 diabetes mellitus with diabetic nephropathy: Secondary | ICD-10-CM | POA: Diagnosis not present

## 2017-08-11 DIAGNOSIS — I255 Ischemic cardiomyopathy: Secondary | ICD-10-CM | POA: Diagnosis not present

## 2017-08-11 DIAGNOSIS — R5381 Other malaise: Secondary | ICD-10-CM | POA: Diagnosis not present

## 2017-08-11 DIAGNOSIS — J9601 Acute respiratory failure with hypoxia: Secondary | ICD-10-CM | POA: Diagnosis not present

## 2017-08-11 DIAGNOSIS — N179 Acute kidney failure, unspecified: Secondary | ICD-10-CM | POA: Diagnosis present

## 2017-08-11 DIAGNOSIS — I469 Cardiac arrest, cause unspecified: Secondary | ICD-10-CM | POA: Diagnosis not present

## 2017-08-11 DIAGNOSIS — R1312 Dysphagia, oropharyngeal phase: Secondary | ICD-10-CM | POA: Diagnosis not present

## 2017-08-11 DIAGNOSIS — Z4682 Encounter for fitting and adjustment of non-vascular catheter: Secondary | ICD-10-CM | POA: Diagnosis not present

## 2017-08-11 DIAGNOSIS — I5042 Chronic combined systolic (congestive) and diastolic (congestive) heart failure: Secondary | ICD-10-CM | POA: Diagnosis not present

## 2017-08-11 DIAGNOSIS — E875 Hyperkalemia: Secondary | ICD-10-CM | POA: Diagnosis not present

## 2017-08-11 DIAGNOSIS — Z66 Do not resuscitate: Secondary | ICD-10-CM | POA: Diagnosis not present

## 2017-08-11 DIAGNOSIS — Z950 Presence of cardiac pacemaker: Secondary | ICD-10-CM | POA: Diagnosis not present

## 2017-08-11 DIAGNOSIS — Z9049 Acquired absence of other specified parts of digestive tract: Secondary | ICD-10-CM

## 2017-08-11 DIAGNOSIS — J810 Acute pulmonary edema: Secondary | ICD-10-CM | POA: Diagnosis not present

## 2017-08-11 DIAGNOSIS — I82411 Acute embolism and thrombosis of right femoral vein: Secondary | ICD-10-CM | POA: Diagnosis not present

## 2017-08-11 DIAGNOSIS — R1310 Dysphagia, unspecified: Secondary | ICD-10-CM | POA: Diagnosis present

## 2017-08-11 DIAGNOSIS — J90 Pleural effusion, not elsewhere classified: Secondary | ICD-10-CM | POA: Diagnosis not present

## 2017-08-11 DIAGNOSIS — I110 Hypertensive heart disease with heart failure: Secondary | ICD-10-CM | POA: Diagnosis not present

## 2017-08-11 DIAGNOSIS — R627 Adult failure to thrive: Secondary | ICD-10-CM | POA: Diagnosis not present

## 2017-08-11 DIAGNOSIS — I428 Other cardiomyopathies: Secondary | ICD-10-CM | POA: Diagnosis not present

## 2017-08-11 DIAGNOSIS — I2510 Atherosclerotic heart disease of native coronary artery without angina pectoris: Secondary | ICD-10-CM | POA: Diagnosis not present

## 2017-08-11 DIAGNOSIS — E7800 Pure hypercholesterolemia, unspecified: Secondary | ICD-10-CM | POA: Diagnosis present

## 2017-08-11 DIAGNOSIS — J9811 Atelectasis: Secondary | ICD-10-CM | POA: Diagnosis present

## 2017-08-11 DIAGNOSIS — Z87891 Personal history of nicotine dependence: Secondary | ICD-10-CM

## 2017-08-11 DIAGNOSIS — E0821 Diabetes mellitus due to underlying condition with diabetic nephropathy: Secondary | ICD-10-CM | POA: Diagnosis present

## 2017-08-11 DIAGNOSIS — G9389 Other specified disorders of brain: Secondary | ICD-10-CM | POA: Diagnosis not present

## 2017-08-11 DIAGNOSIS — I5023 Acute on chronic systolic (congestive) heart failure: Secondary | ICD-10-CM | POA: Diagnosis not present

## 2017-08-11 DIAGNOSIS — I130 Hypertensive heart and chronic kidney disease with heart failure and stage 1 through stage 4 chronic kidney disease, or unspecified chronic kidney disease: Principal | ICD-10-CM | POA: Diagnosis present

## 2017-08-11 DIAGNOSIS — Z806 Family history of leukemia: Secondary | ICD-10-CM

## 2017-08-11 DIAGNOSIS — E1169 Type 2 diabetes mellitus with other specified complication: Secondary | ICD-10-CM | POA: Diagnosis not present

## 2017-08-11 DIAGNOSIS — Z8674 Personal history of sudden cardiac arrest: Secondary | ICD-10-CM | POA: Diagnosis not present

## 2017-08-11 DIAGNOSIS — N171 Acute kidney failure with acute cortical necrosis: Secondary | ICD-10-CM | POA: Diagnosis not present

## 2017-08-11 DIAGNOSIS — Z95810 Presence of automatic (implantable) cardiac defibrillator: Secondary | ICD-10-CM

## 2017-08-11 DIAGNOSIS — Z85820 Personal history of malignant melanoma of skin: Secondary | ICD-10-CM

## 2017-08-11 DIAGNOSIS — R609 Edema, unspecified: Secondary | ICD-10-CM | POA: Diagnosis not present

## 2017-08-11 DIAGNOSIS — R531 Weakness: Secondary | ICD-10-CM

## 2017-08-11 DIAGNOSIS — I351 Nonrheumatic aortic (valve) insufficiency: Secondary | ICD-10-CM | POA: Diagnosis not present

## 2017-08-11 DIAGNOSIS — Z9079 Acquired absence of other genital organ(s): Secondary | ICD-10-CM

## 2017-08-11 DIAGNOSIS — E876 Hypokalemia: Secondary | ICD-10-CM | POA: Diagnosis present

## 2017-08-11 DIAGNOSIS — E669 Obesity, unspecified: Secondary | ICD-10-CM | POA: Diagnosis present

## 2017-08-11 DIAGNOSIS — G4733 Obstructive sleep apnea (adult) (pediatric): Secondary | ICD-10-CM | POA: Diagnosis present

## 2017-08-11 DIAGNOSIS — R0602 Shortness of breath: Secondary | ICD-10-CM | POA: Diagnosis not present

## 2017-08-11 DIAGNOSIS — N170 Acute kidney failure with tubular necrosis: Secondary | ICD-10-CM | POA: Diagnosis present

## 2017-08-11 DIAGNOSIS — I10 Essential (primary) hypertension: Secondary | ICD-10-CM | POA: Diagnosis present

## 2017-08-11 DIAGNOSIS — G9340 Encephalopathy, unspecified: Secondary | ICD-10-CM | POA: Diagnosis not present

## 2017-08-11 DIAGNOSIS — Z9289 Personal history of other medical treatment: Secondary | ICD-10-CM

## 2017-08-11 DIAGNOSIS — L8990 Pressure ulcer of unspecified site, unspecified stage: Secondary | ICD-10-CM

## 2017-08-11 DIAGNOSIS — T68XXXA Hypothermia, initial encounter: Secondary | ICD-10-CM | POA: Diagnosis present

## 2017-08-11 LAB — BLOOD GAS, ARTERIAL
ACID-BASE DEFICIT: 1.5 mmol/L (ref 0.0–2.0)
BICARBONATE: 22 mmol/L (ref 20.0–28.0)
DRAWN BY: 406621
FIO2: 70
LHR: 20 {breaths}/min
MECHVT: 590 mL
O2 SAT: 98 %
PATIENT TEMPERATURE: 95
PCO2 ART: 29.9 mmHg — AB (ref 32.0–48.0)
PEEP/CPAP: 5 cmH2O
PH ART: 7.47 — AB (ref 7.350–7.450)
PO2 ART: 86 mmHg (ref 83.0–108.0)

## 2017-08-11 LAB — CBC WITH DIFFERENTIAL/PLATELET
Basophils Absolute: 0.1 10*3/uL (ref 0.0–0.1)
Basophils Relative: 1 %
EOS ABS: 0.2 10*3/uL (ref 0.0–0.7)
Eosinophils Relative: 1 %
HCT: 47.7 % (ref 39.0–52.0)
Hemoglobin: 15 g/dL (ref 13.0–17.0)
LYMPHS ABS: 4 10*3/uL (ref 0.7–4.0)
Lymphocytes Relative: 28 %
MCH: 30.7 pg (ref 26.0–34.0)
MCHC: 31.4 g/dL (ref 30.0–36.0)
MCV: 97.5 fL (ref 78.0–100.0)
MONO ABS: 0.7 10*3/uL (ref 0.1–1.0)
Monocytes Relative: 5 %
Neutro Abs: 9.4 10*3/uL — ABNORMAL HIGH (ref 1.7–7.7)
Neutrophils Relative %: 65 %
Platelets: 223 10*3/uL (ref 150–400)
RBC: 4.89 MIL/uL (ref 4.22–5.81)
RDW: 12.9 % (ref 11.5–15.5)
WBC: 14.4 10*3/uL — AB (ref 4.0–10.5)

## 2017-08-11 LAB — POCT I-STAT, CHEM 8
BUN: 23 mg/dL — AB (ref 6–20)
BUN: 27 mg/dL — ABNORMAL HIGH (ref 6–20)
BUN: 28 mg/dL — AB (ref 6–20)
BUN: 28 mg/dL — AB (ref 6–20)
CALCIUM ION: 1.12 mmol/L — AB (ref 1.15–1.40)
CALCIUM ION: 1.1 mmol/L — AB (ref 1.15–1.40)
CHLORIDE: 106 mmol/L (ref 101–111)
CHLORIDE: 106 mmol/L (ref 101–111)
CHLORIDE: 111 mmol/L (ref 101–111)
CREATININE: 1.2 mg/dL (ref 0.61–1.24)
CREATININE: 1.6 mg/dL — AB (ref 0.61–1.24)
Calcium, Ion: 0.97 mmol/L — ABNORMAL LOW (ref 1.15–1.40)
Calcium, Ion: 1.11 mmol/L — ABNORMAL LOW (ref 1.15–1.40)
Chloride: 106 mmol/L (ref 101–111)
Creatinine, Ser: 1.5 mg/dL — ABNORMAL HIGH (ref 0.61–1.24)
Creatinine, Ser: 1.6 mg/dL — ABNORMAL HIGH (ref 0.61–1.24)
GLUCOSE: 152 mg/dL — AB (ref 65–99)
GLUCOSE: 240 mg/dL — AB (ref 65–99)
Glucose, Bld: 206 mg/dL — ABNORMAL HIGH (ref 65–99)
Glucose, Bld: 188 mg/dL — ABNORMAL HIGH (ref 65–99)
HCT: 45 % (ref 39.0–52.0)
HCT: 44 % (ref 39.0–52.0)
HEMATOCRIT: 37 % — AB (ref 39.0–52.0)
HEMATOCRIT: 43 % (ref 39.0–52.0)
Hemoglobin: 12.6 g/dL — ABNORMAL LOW (ref 13.0–17.0)
Hemoglobin: 14.6 g/dL (ref 13.0–17.0)
Hemoglobin: 15 g/dL (ref 13.0–17.0)
Hemoglobin: 15.3 g/dL (ref 13.0–17.0)
POTASSIUM: 3.2 mmol/L — AB (ref 3.5–5.1)
Potassium: 4 mmol/L (ref 3.5–5.1)
Potassium: 4 mmol/L (ref 3.5–5.1)
Potassium: 4.6 mmol/L (ref 3.5–5.1)
SODIUM: 145 mmol/L (ref 135–145)
SODIUM: 142 mmol/L (ref 135–145)
Sodium: 141 mmol/L (ref 135–145)
Sodium: 143 mmol/L (ref 135–145)
TCO2: 18 mmol/L — AB (ref 22–32)
TCO2: 21 mmol/L — AB (ref 22–32)
TCO2: 22 mmol/L (ref 22–32)
TCO2: 22 mmol/L (ref 22–32)

## 2017-08-11 LAB — CBC
HEMATOCRIT: 44.7 % (ref 39.0–52.0)
HEMOGLOBIN: 14.7 g/dL (ref 13.0–17.0)
MCH: 31.2 pg (ref 26.0–34.0)
MCHC: 32.9 g/dL (ref 30.0–36.0)
MCV: 94.9 fL (ref 78.0–100.0)
Platelets: 199 10*3/uL (ref 150–400)
RBC: 4.71 MIL/uL (ref 4.22–5.81)
RDW: 12.9 % (ref 11.5–15.5)
WBC: 20.2 10*3/uL — ABNORMAL HIGH (ref 4.0–10.5)

## 2017-08-11 LAB — BASIC METABOLIC PANEL
ANION GAP: 9 (ref 5–15)
BUN: 28 mg/dL — AB (ref 6–20)
CALCIUM: 7.3 mg/dL — AB (ref 8.9–10.3)
CO2: 20 mmol/L — AB (ref 22–32)
Chloride: 109 mmol/L (ref 101–111)
Creatinine, Ser: 1.54 mg/dL — ABNORMAL HIGH (ref 0.61–1.24)
GFR calc Af Amer: 46 mL/min — ABNORMAL LOW (ref >60)
GFR calc non Af Amer: 40 mL/min — ABNORMAL LOW (ref >60)
GLUCOSE: 219 mg/dL — AB (ref 65–99)
Potassium: 4.2 mmol/L (ref 3.5–5.1)
Sodium: 138 mmol/L (ref 135–145)

## 2017-08-11 LAB — TYPE AND SCREEN
ABO/RH(D): A POS
ANTIBODY SCREEN: NEGATIVE

## 2017-08-11 LAB — I-STAT TROPONIN, ED: Troponin i, poc: 0.1 ng/mL (ref 0.00–0.08)

## 2017-08-11 LAB — I-STAT CHEM 8, ED
BUN: 29 mg/dL — ABNORMAL HIGH (ref 6–20)
CALCIUM ION: 1.07 mmol/L — AB (ref 1.15–1.40)
CREATININE: 1.7 mg/dL — AB (ref 0.61–1.24)
Chloride: 105 mmol/L (ref 101–111)
GLUCOSE: 253 mg/dL — AB (ref 65–99)
HCT: 47 % (ref 39.0–52.0)
HEMOGLOBIN: 16 g/dL (ref 13.0–17.0)
Potassium: 4.4 mmol/L (ref 3.5–5.1)
Sodium: 142 mmol/L (ref 135–145)
TCO2: 27 mmol/L (ref 22–32)

## 2017-08-11 LAB — COMPREHENSIVE METABOLIC PANEL
ALT: 63 U/L (ref 17–63)
ANION GAP: 7 (ref 5–15)
AST: 112 U/L — ABNORMAL HIGH (ref 15–41)
Albumin: 3 g/dL — ABNORMAL LOW (ref 3.5–5.0)
Alkaline Phosphatase: 76 U/L (ref 38–126)
BUN: 27 mg/dL — ABNORMAL HIGH (ref 6–20)
CHLORIDE: 106 mmol/L (ref 101–111)
CO2: 25 mmol/L (ref 22–32)
CREATININE: 1.77 mg/dL — AB (ref 0.61–1.24)
Calcium: 7.8 mg/dL — ABNORMAL LOW (ref 8.9–10.3)
GFR, EST AFRICAN AMERICAN: 39 mL/min — AB (ref >60)
GFR, EST NON AFRICAN AMERICAN: 34 mL/min — AB (ref >60)
Glucose, Bld: 259 mg/dL — ABNORMAL HIGH (ref 65–99)
POTASSIUM: 4.6 mmol/L (ref 3.5–5.1)
SODIUM: 138 mmol/L (ref 135–145)
Total Bilirubin: 1 mg/dL (ref 0.3–1.2)
Total Protein: 6 g/dL — ABNORMAL LOW (ref 6.5–8.1)

## 2017-08-11 LAB — PROTIME-INR
INR: 1.16
INR: 1.17
PROTHROMBIN TIME: 14.7 s (ref 11.4–15.2)
Prothrombin Time: 14.8 seconds (ref 11.4–15.2)

## 2017-08-11 LAB — POCT I-STAT 3, ART BLOOD GAS (G3+)
ACID-BASE DEFICIT: 2 mmol/L (ref 0.0–2.0)
Bicarbonate: 22.1 mmol/L (ref 20.0–28.0)
O2 SAT: 97 %
PH ART: 7.426 (ref 7.350–7.450)
PO2 ART: 76 mmHg — AB (ref 83.0–108.0)
TCO2: 23 mmol/L (ref 22–32)
pCO2 arterial: 33 mmHg (ref 32.0–48.0)

## 2017-08-11 LAB — I-STAT ARTERIAL BLOOD GAS, ED
ACID-BASE DEFICIT: 4 mmol/L — AB (ref 0.0–2.0)
BICARBONATE: 26.7 mmol/L (ref 20.0–28.0)
O2 SAT: 97 %
PO2 ART: 109 mmHg — AB (ref 83.0–108.0)
TCO2: 29 mmol/L (ref 22–32)
pCO2 arterial: 67.1 mmHg (ref 32.0–48.0)
pH, Arterial: 7.199 — CL (ref 7.350–7.450)

## 2017-08-11 LAB — ABO/RH: ABO/RH(D): A POS

## 2017-08-11 LAB — GLUCOSE, CAPILLARY
GLUCOSE-CAPILLARY: 209 mg/dL — AB (ref 65–99)
GLUCOSE-CAPILLARY: 192 mg/dL — AB (ref 65–99)
GLUCOSE-CAPILLARY: 123 mg/dL — AB (ref 65–99)
Glucose-Capillary: 148 mg/dL — ABNORMAL HIGH (ref 65–99)
Glucose-Capillary: 206 mg/dL — ABNORMAL HIGH (ref 65–99)
Glucose-Capillary: 209 mg/dL — ABNORMAL HIGH (ref 65–99)

## 2017-08-11 LAB — I-STAT CG4 LACTIC ACID, ED: Lactic Acid, Venous: 3.87 mmol/L (ref 0.5–1.9)

## 2017-08-11 LAB — LACTIC ACID, PLASMA: LACTIC ACID, VENOUS: 3.4 mmol/L — AB (ref 0.5–1.9)

## 2017-08-11 LAB — TROPONIN I: Troponin I: 0.46 ng/mL (ref <0.03)

## 2017-08-11 LAB — APTT: APTT: 30 s (ref 24–36)

## 2017-08-11 LAB — TRIGLYCERIDES: TRIGLYCERIDES: 220 mg/dL — AB (ref <150)

## 2017-08-11 MED ORDER — SUCCINYLCHOLINE CHLORIDE 20 MG/ML IJ SOLN
INTRAMUSCULAR | Status: AC | PRN
  Administered 2017-08-11: 120 mg via INTRAVENOUS

## 2017-08-11 MED ORDER — NOREPINEPHRINE BITARTRATE 1 MG/ML IV SOLN
0.0000 ug/min | INTRAVENOUS | Status: DC
  Administered 2017-08-11: 2 ug/min via INTRAVENOUS
  Filled 2017-08-11 (×2): qty 4

## 2017-08-11 MED ORDER — FENTANYL CITRATE (PF) 100 MCG/2ML IJ SOLN
50.0000 ug | Freq: Once | INTRAMUSCULAR | Status: AC
  Administered 2017-08-11: 50 ug via INTRAVENOUS

## 2017-08-11 MED ORDER — PROPOFOL 1000 MG/100ML IV EMUL
0.0000 ug/kg/min | INTRAVENOUS | Status: DC
  Administered 2017-08-11: 25 ug/kg/min via INTRAVENOUS
  Administered 2017-08-11: 10 ug/kg/min via INTRAVENOUS
  Administered 2017-08-12 (×4): 50 ug/kg/min via INTRAVENOUS
  Administered 2017-08-12: 30 ug/kg/min via INTRAVENOUS
  Administered 2017-08-12: 25 ug/kg/min via INTRAVENOUS
  Administered 2017-08-12 – 2017-08-13 (×3): 50 ug/kg/min via INTRAVENOUS
  Filled 2017-08-11: qty 200
  Filled 2017-08-11 (×9): qty 100

## 2017-08-11 MED ORDER — CARVEDILOL 25 MG PO TABS
25.0000 mg | ORAL_TABLET | Freq: Two times a day (BID) | ORAL | Status: DC
  Administered 2017-08-12 – 2017-08-26 (×28): 25 mg
  Filled 2017-08-11 (×29): qty 1

## 2017-08-11 MED ORDER — FENTANYL CITRATE (PF) 100 MCG/2ML IJ SOLN: 50.0000 ug | INTRAMUSCULAR | Status: DC | PRN

## 2017-08-11 MED ORDER — SODIUM CHLORIDE 0.9 % IV SOLN
INTRAVENOUS | Status: DC
  Administered 2017-08-11: 1.5 [IU]/h via INTRAVENOUS
  Filled 2017-08-11: qty 1

## 2017-08-11 MED ORDER — PROPOFOL 1000 MG/100ML IV EMUL
INTRAVENOUS | Status: AC
  Filled 2017-08-11: qty 100

## 2017-08-11 MED ORDER — FUROSEMIDE 10 MG/ML IJ SOLN
40.0000 mg | Freq: Two times a day (BID) | INTRAMUSCULAR | Status: DC
  Administered 2017-08-12: 40 mg via INTRAVENOUS
  Filled 2017-08-11: qty 4

## 2017-08-11 MED ORDER — POTASSIUM CHLORIDE 10 MEQ/50ML IV SOLN
10.0000 meq | INTRAVENOUS | Status: AC
  Administered 2017-08-11 – 2017-08-12 (×4): 10 meq via INTRAVENOUS
  Filled 2017-08-11 (×4): qty 50

## 2017-08-11 MED ORDER — ETOMIDATE 2 MG/ML IV SOLN
INTRAVENOUS | Status: AC | PRN
  Administered 2017-08-11: 20 mg via INTRAVENOUS

## 2017-08-11 MED ORDER — SODIUM CHLORIDE 0.9% FLUSH
10.0000 mL | Freq: Two times a day (BID) | INTRAVENOUS | Status: DC
  Administered 2017-08-11: 20 mL
  Administered 2017-08-12: 30 mL
  Administered 2017-08-12 – 2017-08-13 (×2): 10 mL

## 2017-08-11 MED ORDER — FENTANYL BOLUS VIA INFUSION
25.0000 ug | INTRAVENOUS | Status: DC | PRN
  Filled 2017-08-11: qty 25

## 2017-08-11 MED ORDER — HEPARIN SODIUM (PORCINE) 5000 UNIT/ML IJ SOLN
5000.0000 [IU] | Freq: Three times a day (TID) | INTRAMUSCULAR | Status: DC
  Administered 2017-08-11 – 2017-08-18 (×21): 5000 [IU] via SUBCUTANEOUS
  Filled 2017-08-11 (×21): qty 1

## 2017-08-11 MED ORDER — ARTIFICIAL TEARS OPHTHALMIC OINT
1.0000 "application " | TOPICAL_OINTMENT | Freq: Three times a day (TID) | OPHTHALMIC | Status: DC
  Administered 2017-08-11 – 2017-08-12 (×4): 1 via OPHTHALMIC
  Filled 2017-08-11: qty 3.5

## 2017-08-11 MED ORDER — CHLORHEXIDINE GLUCONATE CLOTH 2 % EX PADS
6.0000 | MEDICATED_PAD | Freq: Every day | CUTANEOUS | Status: DC
  Administered 2017-08-11 – 2017-08-14 (×6): 6 via TOPICAL

## 2017-08-11 MED ORDER — PANTOPRAZOLE SODIUM 40 MG IV SOLR
40.0000 mg | Freq: Every day | INTRAVENOUS | Status: DC
  Administered 2017-08-11 – 2017-08-13 (×3): 40 mg via INTRAVENOUS
  Filled 2017-08-11 (×3): qty 40

## 2017-08-11 MED ORDER — HEPARIN SODIUM (PORCINE) 5000 UNIT/ML IJ SOLN: 5000.0000 [IU] | Freq: Three times a day (TID) | INTRAMUSCULAR | Status: DC

## 2017-08-11 MED ORDER — SODIUM CHLORIDE 0.9% FLUSH: 10.0000 mL | INTRAVENOUS | Status: DC | PRN

## 2017-08-11 MED ORDER — MIDAZOLAM HCL 2 MG/2ML IJ SOLN: 1.0000 mg | INTRAMUSCULAR | Status: DC | PRN

## 2017-08-11 MED ORDER — FENTANYL 2500MCG IN NS 250ML (10MCG/ML) PREMIX INFUSION
100.0000 ug/h | INTRAVENOUS | Status: DC
  Administered 2017-08-11: 100 ug/h via INTRAVENOUS
  Administered 2017-08-12 – 2017-08-13 (×4): 300 ug/h via INTRAVENOUS
  Filled 2017-08-11 (×5): qty 250

## 2017-08-11 MED ORDER — SODIUM CHLORIDE 0.9 % IV SOLN
1.0000 ug/kg/min | INTRAVENOUS | Status: DC
  Administered 2017-08-11: 1 ug/kg/min via INTRAVENOUS
  Administered 2017-08-12: 1.5 ug/kg/min via INTRAVENOUS
  Filled 2017-08-11 (×2): qty 20

## 2017-08-11 MED ORDER — ALBUTEROL SULFATE (2.5 MG/3ML) 0.083% IN NEBU
INHALATION_SOLUTION | RESPIRATORY_TRACT | Status: AC
  Administered 2017-08-11: 15:00:00
  Filled 2017-08-11: qty 3

## 2017-08-11 MED ORDER — ORAL CARE MOUTH RINSE
15.0000 mL | OROMUCOSAL | Status: DC
  Administered 2017-08-11 – 2017-08-18 (×66): 15 mL via OROMUCOSAL

## 2017-08-11 MED ORDER — HYDRALAZINE HCL 20 MG/ML IJ SOLN
5.0000 mg | Freq: Four times a day (QID) | INTRAMUSCULAR | Status: DC | PRN
  Administered 2017-08-15 – 2017-08-18 (×3): 10 mg via INTRAVENOUS
  Filled 2017-08-11 (×4): qty 1

## 2017-08-11 MED ORDER — CISATRACURIUM BOLUS VIA INFUSION
0.1000 mg/kg | Freq: Once | INTRAVENOUS | Status: AC
  Administered 2017-08-11: 10.5 mg via INTRAVENOUS
  Filled 2017-08-11: qty 11

## 2017-08-11 MED ORDER — ASPIRIN 81 MG PO CHEW
81.0000 mg | CHEWABLE_TABLET | Freq: Every day | ORAL | Status: DC
  Administered 2017-08-12 – 2017-08-26 (×15): 81 mg
  Filled 2017-08-11 (×15): qty 1

## 2017-08-11 MED ORDER — INSULIN ASPART 100 UNIT/ML ~~LOC~~ SOLN: 1.0000 [IU] | SUBCUTANEOUS | Status: DC

## 2017-08-11 MED ORDER — CISATRACURIUM BOLUS VIA INFUSION
0.0500 mg/kg | INTRAVENOUS | Status: DC | PRN
  Filled 2017-08-11: qty 6

## 2017-08-11 MED ORDER — CHLORHEXIDINE GLUCONATE 0.12% ORAL RINSE (MEDLINE KIT)
15.0000 mL | Freq: Two times a day (BID) | OROMUCOSAL | Status: DC
  Administered 2017-08-11 – 2017-08-20 (×19): 15 mL via OROMUCOSAL

## 2017-08-11 MED ORDER — ENALAPRILAT 1.25 MG/ML IV SOLN: 1.2500 mg | Freq: Four times a day (QID) | INTRAVENOUS | Status: DC

## 2017-08-11 MED ORDER — ASPIRIN 300 MG RE SUPP
300.0000 mg | RECTAL | Status: AC
  Administered 2017-08-11: 300 mg via RECTAL
  Filled 2017-08-11: qty 1

## 2017-08-11 MED ORDER — FUROSEMIDE 10 MG/ML IJ SOLN
40.0000 mg | Freq: Once | INTRAMUSCULAR | Status: AC
  Administered 2017-08-11: 40 mg via INTRAVENOUS
  Filled 2017-08-11: qty 4

## 2017-08-11 MED ORDER — SODIUM CHLORIDE 0.9 % IV SOLN: INTRAVENOUS | Status: DC

## 2017-08-11 MED ORDER — PRAVASTATIN SODIUM 40 MG PO TABS
80.0000 mg | ORAL_TABLET | Freq: Every day | ORAL | Status: DC
  Administered 2017-08-11 – 2017-08-26 (×16): 80 mg
  Filled 2017-08-11 (×16): qty 2

## 2017-08-11 MED ORDER — ALBUTEROL SULFATE (2.5 MG/3ML) 0.083% IN NEBU: 2.5000 mg | INHALATION_SOLUTION | RESPIRATORY_TRACT | Status: DC | PRN

## 2017-08-11 MED ORDER — PROPOFOL 1000 MG/100ML IV EMUL: 25.0000 ug/kg/min | INTRAVENOUS | Status: DC

## 2017-08-11 NOTE — ED Notes (Signed)
Attempted report 

## 2017-08-11 NOTE — ED Provider Notes (Signed)
[facility] EMERGENCY DEPARTMENT Provider Note   CSN: [id number] Arrival date & time: 08/11/17  1322     History   Chief Complaint Chief Complaint  Patient presents with  . Cardiac Arrest    HPI Malik Rojas is a 84 y.o. male. Level 5 caveat HPI   This is a 84 year old man history of coronary artery disease presents today status post cardiac arrest.  Per EMS report wife states he came pale and collapsed.  On their arrival he was pulseless.  Resuscitation was 37 minutes with paced rhythm followed by return of pulses.  I/O and kidney airway placed prehospital.-Patient received 6 rounds of epi before return of spontaneous circulation.  Past Medical History:  Diagnosis Date  . Basal cell carcinoma    "scalp, arms, right face;  some cut, some burndt off"  . CAD (coronary artery disease)    LHC 3/14: Distal left main 10%, LAD 20%, circumflex 10-20%, proximal RCA 20%, distal 40-50%, EF 50-55%.  . Chronic combined systolic and diastolic CHF, NYHA class 4 ([place]) 11/06/2016   Echo 4/18: severe LVH, EF 45-50, global HK, Gr 1 DD, mild AS (mean 9/peak 17), MAC, mod LAE, trivial eff  . Complete heart block (HCC)    Status post Medtronic pacemaker  . High cholesterol   . HTN (hypertension)   . Hx of cardiovascular stress test    a. Cut Off MV 3/14:  Low risk, EF 36%, apical inf scar, no ischemia, inf-apical HK>> catheterization however demonstrated normal left ventricular function  . Melanoma ([place])    back  . Obesity   . Pneumonia 1940  . Presence of permanent cardiac pacemaker    Medtronic  . Type II diabetes mellitus ([place])   . Urinary frequency   . Urinary incontinence     Patient Active Problem List   Diagnosis Date Noted  . Hypertensive retinopathy of left eye, grade 1 03/25/2017  . Controlled diabetes mellitus type 2 with complications ([place]) 75/64/3329  . Chronic combined systolic and diastolic CHF (congestive heart failure) ([place]) 11/06/2016  . Squamous  cell carcinoma of skin 02/20/2016  . Chronic acquired lymphedema 08/02/2015  . Mixed incontinence 03/23/2015  . Benign prostatic hyperplasia with weak urinary stream 10/13/2014  . Hyperlipidemia associated with type 2 diabetes mellitus ([place]) 05/25/2013  . Chronic kidney disease, stage III (moderate) ([place]) 05/25/2013  . Diabetes mellitus due to underlying condition with diabetic nephropathy, without long-term current use of insulin ([place]) 10/01/2011  . Xerostomia 08/28/2011  . Hypertensive heart disease with CHF (congestive heart failure) ([place]) 08/28/2011  . Obesity (BMI 30-39.9) 05/30/2011  . Pacemaker-Medtronic 11/21/2010  . DEPRESSION/ANXIETY 08/29/2009  . Obstructive sleep apnea 07/21/2009    Past Surgical History:  Procedure Laterality Date  . APPENDECTOMY  40 years ago  . BASAL CELL CARCINOMA EXCISION     "scalp, right face"  . INSERT / REPLACE / REMOVE PACEMAKER  ~ 1999   medtronic EnRhythm  . MOHS SURGERY Left    ear; being monitored-every 6 months  . PERMANENT PACEMAKER GENERATOR CHANGE N/A 02/12/2013   Procedure: PERMANENT PACEMAKER GENERATOR CHANGE;  Surgeon: Deboraha Sprang, MD;  Location: [facility] CATH LAB;  Service: Cardiovascular;  Laterality: N/A;  . TONSILLECTOMY     as child  . TRANSURETHRAL RESECTION OF PROSTATE N/A 10/13/2014   Procedure: WOLF TRANSURETHRAL RESECTION OF THE PROSTATE (TURP);  Surgeon: Carolan Clines, MD;  Location: [facility] ORS;  Service: Urology;  Laterality: N/A;       Home Medications  Prior to Admission medications   Medication Sig Start Date End Date Taking? Authorizing Provider  aspirin 81 MG tablet Take 81 mg by mouth daily.    [provider]  carvedilol (COREG) 25 MG tablet Take 1 tablet (25 mg total) 2 (two) times daily by mouth. 06/10/17 09/08/17  [name] T, PA-C  CINNAMON PO Take 1,000 mg by mouth 2 (two) times daily as needed.    [provider]  furosemide (LASIX) 40 MG tablet Take 1 tablet (40 mg total) by mouth  daily. 04/03/17 07/02/17  Deboraha Sprang, MD  glucose blood (ONE TOUCH ULTRA TEST) test strip TEST twice a day DX E11.9 04/03/17   Denita Lung, MD  Insulin Glargine (TOUJEO MAX SOLOSTAR) 300 UNIT/ML SOPN Inject 12 Units into the skin daily. 04/03/17   Denita Lung, MD  Insulin Pen Needle (BD ULTRA-FINE MICRO PEN NEEDLE) 32G X 6 MM MISC Patient is to use one pen needle per day DX E11.9 04/03/17   Denita Lung, MD  isosorbide mononitrate (IMDUR) 60 MG 24 hr tablet Take 1 tablet (60 mg total) by mouth daily. 04/03/17   Deboraha Sprang, MD  lisinopril (PRINIVIL,ZESTRIL) 20 MG tablet Take 1 tablet (20 mg total) by mouth 2 (two) times daily. 04/03/17 07/02/17  Deboraha Sprang, MD  Omega-3 Fatty Acids (FISH OIL) 1000 MG CAPS Take 1,000 mg by mouth daily.     [provider]  [facility] DELICA LANCETS 16X MISC TEST TWICE DAILY DX E11.9 04/03/17   Denita Lung, MD  potassium chloride (K-DUR) 10 MEQ tablet Take 10 mEq by mouth daily.    [provider]  pravastatin (PRAVACHOL) 80 MG tablet Take 1 tablet (80 mg total) by mouth daily. 04/03/17   Denita Lung, MD  pravastatin (PRAVACHOL) 80 MG tablet take 1 tablet by mouth once daily 07/17/17   Denita Lung, MD  psyllium (REGULOID) 0.52 g capsule Take 0.52 g by mouth daily.    [provider]  Pumpkin Seed-Soy Germ (AZO BLADDER CONTROL/GO-LESS) CAPS Take 1 capsule by mouth daily.    [provider]  silver sulfADIAZINE (SILVADENE) 1 % cream Apply 1 application topically daily as needed (LEG SORE).    [provider]  sulfamethoxazole-trimethoprim (BACTRIM DS,SEPTRA DS) 800-160 MG tablet Take 1 tablet by mouth 2 (two) times daily. Patient not taking: Reported on 07/22/2017 07/07/17   Denita Lung, MD    Family History Family History  Problem Relation Age of Onset  . Leukemia Mother     Social History Social History   Tobacco Use  . Smoking status: Former Smoker    Packs/day: 3.00    Years:  30.00    Pack years: 90.00    Last attempt to quit: 1975    Years since quitting: 44.0  . Smokeless tobacco: Never Used  Substance Use Topics  . Alcohol use: Yes    Comment: 11/06/2016 "I'll have 1 drink/year, maybe"  . Drug use: No     Allergies   Patient has no known allergies.   Review of Systems Review of Systems  Unable to perform ROS: Acuity of condition     Physical Exam Updated Vital Signs BP 125/83 (BP Location: Right Arm)   Pulse 88   Temp (!) 96.3 F (35.7 C) (Temporal)   SpO2 92%   Physical Exam  Constitutional: He appears well-developed and well-nourished.  HENT:  Head: Normocephalic and atraumatic.  Right Ear: External ear normal.  Left Ear: External ear normal.  Mouth/Throat: Oropharynx is clear and moist.  Eyes: Pupils are equal, round, and reactive to light.  4-5 mm and reactive  Neck: Normal range of motion.  Cardiovascular: Normal rate and regular rhythm.  Pulmonary/Chest:  King airway in place with good bilateral breath sounds  Abdominal:  Abdomen distended  Musculoskeletal:  No obvious deformity noted  Neurological:  Patient unresponsive with some breathing over bag-valve-mask  Skin: Skin is warm. Capillary refill takes less than 2 seconds.  Nursing note and vitals reviewed.    ED Treatments / Results  Labs (all labs ordered are listed, but only abnormal results are displayed) Labs Reviewed  I-STAT CG4 LACTIC ACID, ED - Abnormal; Notable for the following components:      Result Value   Lactic Acid, Venous 3.87 (*)    All other components within normal limits  LACTIC ACID, PLASMA    EKG  EKG Interpretation  Date/Time:  Monday August 11 2017 13:37:15 EST Ventricular Rate:  67 PR Interval:    QRS Duration: 176 QT Interval:  500 QTC Calculation: 528 R Axis:   -78 Text Interpretation:  Sinus rhythm Borderline short PR interval IVCD, consider atypical RBBB LVH with IVCD, LAD and secondary repol abnrm Prolonged QT interval  Confirmed by Pattricia Boss [phone]) on 08/11/2017 3:17:53 PM       Radiology Dg Chest Portable 1 View  Result Date: 08/11/2017 CLINICAL DATA:  Intubation EXAM: PORTABLE CHEST 1 VIEW COMPARISON:  11/07/2016 FINDINGS: Endotracheal tube in good position. Duo lead pacemaker unchanged in position. NG tube in the stomach. Cardiac enlargement. Diffuse bilateral airspace disease most consistent with edema. Small right effusion. Bibasilar atelectasis. IMPRESSION: Endotracheal tube in good position Diffuse bilateral airspace disease most consistent with pulmonary edema. Electronically Signed   By: Franchot Gallo M.D.   On: 08/11/2017 14:38    Procedures .Central Line Date/Time: 08/11/2017 2:07 PM Performed by: Pattricia Boss, MD Authorized by: Pattricia Boss, MD   Consent:    Consent obtained:  Emergent situation   Alternatives discussed:  No treatment Pre-procedure details:    Hand hygiene: Hand hygiene performed prior to insertion     Sterile barrier technique: All elements of maximal sterile technique followed     Skin preparation:  2% chlorhexidine   Skin preparation agent: Skin preparation agent completely dried prior to procedure   Procedure details:    Location:  R femoral   Patient position:  Flat   Procedural supplies:  Triple lumen   Catheter size:  7 Fr   Landmarks identified: yes     Ultrasound guidance: yes     Sterile ultrasound techniques: Sterile gel and sterile probe covers were used     Number of attempts:  2   Successful placement: yes   Post-procedure details:    Post-procedure:  Line sutured and dressing applied   Assessment:  Blood return through all ports and free fluid flow   Patient tolerance of procedure:  Tolerated well, no immediate complications   (including critical care time)  Medications Ordered in ED Medications  etomidate (AMIDATE) injection (20 mg Intravenous Given 08/11/17 1354)  succinylcholine (ANECTINE) injection (120 mg Intravenous Given 08/11/17 1355)       Initial Impression / Assessment and Plan / ED Course  I have reviewed the triage vital signs and the nursing notes.  Pertinent labs & imaging results that were available during my care of the patient were reviewed by me and considered in my medical decision making (  see chart for details).     84 y.o. Male s/p arrest presents with paced rhythm, pulses intact, bp 130/80, with reactive pupils .  Critical care and cardiology paged to evaluate.  Discussed with wife and wishes proceed with cooling and interventions at this time.  Dr. Burt Knack with me and we both discussed with wife.  Critical care now at bedside and care assumed Post arrest return of rosc- likely respiratory in etiology.  CHF vs infiltrates on cxr.  Lactic acid elevated but nonspecific in post arrest setting.  CRITICAL CARE Performed by: Pattricia Boss Total critical care time: 45 minutes Critical care time was exclusive of separately billable procedures and treating other patients. Critical care was necessary to treat or prevent imminent or life-threatening deterioration. Critical care was time spent personally by me on the following activities: development of treatment plan with patient and/or surrogate as well as nursing, discussions with consultants, evaluation of patient's response to treatment, examination of patient, obtaining history from patient or surrogate, ordering and performing treatments and interventions, ordering and review of laboratory studies, ordering and review of radiographic studies, pulse oximetry and re-evaluation of patient's condition.   Final Clinical Impressions(s) / ED Diagnoses   Final diagnoses:  Cardiac arrest [facility])    ED Discharge Orders    None       Pattricia Boss, MD 08/11/17 1520

## 2017-08-11 NOTE — Consult Note (Signed)
Cardiology Consult    Patient ID: Malik Rojas MRN: [id number], DOB/AGE: [birth year]   Admit date: 08/11/2017 Date of Consult: 08/11/2017  Primary Physician: Denita Lung, MD Primary Cardiologist: Caryl Comes Requesting Provider: Jeanell Sparrow Reason for Consultation: Cardiac arrest  Malik Rojas is a 84 y.o. male who is being seen today for the evaluation of cardiac arrest at the request of Dr. Jeanell Sparrow.  Patient Profile    84 yo male with PMH of NICM, CHB s/p PPM, non obstructive CAD, HTN, DM, HL who presented with sudden cardiac arrest.   Past Medical History   Past Medical History:  Diagnosis Date  . Basal cell carcinoma    "scalp, arms, right face;  some cut, some burndt off"  . CAD (coronary artery disease)    LHC 3/14: Distal left main 10%, LAD 20%, circumflex 10-20%, proximal RCA 20%, distal 40-50%, EF 50-55%.  . Chronic combined systolic and diastolic CHF, NYHA class 4 ([place]) 11/06/2016   Echo 4/18: severe LVH, EF 45-50, global HK, Gr 1 DD, mild AS (mean 9/peak 17), MAC, mod LAE, trivial eff  . Complete heart block (HCC)    Status post Medtronic pacemaker  . High cholesterol   . HTN (hypertension)   . Hx of cardiovascular stress test    a. [place] MV 3/14:  Low risk, EF 36%, apical inf scar, no ischemia, inf-apical HK>> catheterization however demonstrated normal left ventricular function  . Melanoma ([place])    back  . Obesity   . Pneumonia 1940  . Presence of permanent cardiac pacemaker    Medtronic  . Type II diabetes mellitus ([place])   . Urinary frequency   . Urinary incontinence     Past Surgical History:  Procedure Laterality Date  . APPENDECTOMY  40 years ago  . BASAL CELL CARCINOMA EXCISION     "scalp, right face"  . INSERT / REPLACE / REMOVE PACEMAKER  ~ 1999   medtronic EnRhythm  . MOHS SURGERY Left    ear; being monitored-every 6 months  . PERMANENT PACEMAKER GENERATOR CHANGE N/A 02/12/2013   Procedure: PERMANENT PACEMAKER GENERATOR CHANGE;  Surgeon: Deboraha Sprang, MD;  Location: [facility] CATH LAB;  Service: Cardiovascular;  Laterality: N/A;  . TONSILLECTOMY     as child  . TRANSURETHRAL RESECTION OF PROSTATE N/A 10/13/2014   Procedure: WOLF TRANSURETHRAL RESECTION OF THE PROSTATE (TURP);  Surgeon: Carolan Clines, MD;  Location: [facility] ORS;  Service: Urology;  Laterality: N/A;     Allergies  No Known Allergies  History of Present Illness    Mr. Malik Rojas is an 84 yo male with PMH of NICM, CHB s/p PPM, non obstructive CAD, HTN, DM, HL. Reported to have an EF as low as 35-40% in the past. Underwent LHC in 3/14 with distal LM 10%, LAD 20%, Lcx 10-20%, pRCA 20% dRCA 40-50%. Appears he was admitted from the office back in 4/18 with acute heart failure.Echo showed EF of 50-55%. Was then seen by Dr. Caryl Comes 7/18 and his BB, and ACEi were adjusted at that time.   Last seen in the office on 10/18 for follow up with [name]. Overall reported doing well. No significant shortness of breath, but chronic LE edema that was not changed.   Per reports he had not been feeling well for the past week. Was seen by PCP and dx with URI. Today was in the garage after taking out the trash and walked back into the house with his wife. Reported significant shortness of  breath. Wife noted he then collapsed in front of her. She states he was still breathing and called EMS. On arrival, EMS felt questionable faint pulse. Initial rhythm appeared to have been VF, then appeared to be a paced rhythm. Pulses were lost. CPR for a total 37 minutes with 6 epis given. Pulses regained by arrival to the ED.   Inpatient Medications      Family History    Family History  Problem Relation Age of Onset  . Leukemia Mother     Social History    Social History   Socioeconomic History  . Marital status: Married    Spouse name: Jolene  . Number of children: Y  . Years of education: Not on file  . Highest education level: Not on file  Social Needs  . Financial resource strain: Not on  file  . Food insecurity - worry: Not on file  . Food insecurity - inability: Not on file  . Transportation needs - medical: Not on file  . Transportation needs - non-medical: Not on file  Occupational History  . Occupation: truck Geophysicist/field seismologist    Comment: retired  Tobacco Use  . Smoking status: Former Smoker    Packs/day: 3.00    Years: 30.00    Pack years: 90.00    Last attempt to quit: 1975    Years since quitting: 44.0  . Smokeless tobacco: Never Used  Substance and Sexual Activity  . Alcohol use: Yes    Comment: 11/06/2016 "I'll have 1 drink/year, maybe"  . Drug use: No  . Sexual activity: Not Currently  Other Topics Concern  . Not on file  Social History Narrative  . Not on file     Review of Systems    See HPI  All other systems reviewed and are otherwise negative except as noted above.  Physical Exam    Blood pressure 125/83, pulse 88, temperature (!) 96.3 F (35.7 C), temperature source Temporal, height 5' 10"  (1.778 m), weight 231 lb 14.8 oz (105.2 kg), SpO2 92 %.  General: Intubated, sedated. Neuro: Sedated HEENT: Normal  Neck: Supple no JVD. Lungs:  Resp regular and unlabored, CTA. Heart: RRR no s3, s4, or murmurs. Abdomen: Soft, non-tender, distended, BS + x 4.  Extremities: Chronic LE venous changes.   Labs    Troponin (Point of Care Test) Recent Labs    08/11/17 1352  TROPIPOC 0.10*   No results for input(s): CKTOTAL, CKMB, TROPONINI in the last 72 hours. Lab Results  Component Value Date   WBC 7.6 11/06/2016   HGB 14.4 11/06/2016   HCT 44.6 11/06/2016   MCV 93.7 11/06/2016   PLT 186 11/06/2016   No results for input(s): NA, K, CL, CO2, BUN, CREATININE, CALCIUM, PROT, BILITOT, ALKPHOS, ALT, AST, GLUCOSE in the last 168 hours.  Invalid input(s): LABALBU Lab Results  Component Value Date   CHOL 134 07/23/2016   HDL 39 (L) 07/23/2016   LDLCALC 53 07/23/2016   TRIG 211 (H) 07/23/2016   Lab Results  Component Value Date   DDIMER (H) 12/01/2009     0.73        AT THE INHOUSE ESTABLISHED CUTOFF VALUE OF 0.48 ug/mL FEU, THIS ASSAY HAS BEEN DOCUMENTED IN THE LITERATURE TO HAVE A SENSITIVITY AND NEGATIVE PREDICTIVE VALUE OF AT LEAST 98 TO 99%.  THE TEST RESULT SHOULD BE CORRELATED WITH AN ASSESSMENT OF THE CLINICAL PROBABILITY OF DVT / VTE.     Radiology Studies    No results found.  ECG & Cardiac Imaging    EKG: Paced  Echo: 4/18  Study Conclusions  - Procedure narrative: Transthoracic echocardiography. Image   quality was fair. The study was technically difficult, as a   result of poor patient compliance and restricted patient   mobility. - Left ventricle: The cavity size was normal. Wall thickness was   increased in a pattern of severe LVH. Systolic function was   mildly reduced. The estimated ejection fraction was in the range   of 45% to 50%. Global hypokinesis. Doppler parameters are   consistent with abnormal left ventricular relaxation (grade 1   diastolic dysfunction). The E/e&' ratio is between 8-15,   suggesting indeterminate LV filling pressure. - Aortic valve: Calcified annulus. There is mild stenosis. Mean   gradient (S): 9 mm Hg. Peak gradient (S): 17 mm Hg. Valve area   (Vmax): 2.48 cm^2. Valve area (Vmean): 2.36 cm^2. - Mitral valve: Calcified annulus. Mildly thickened leaflets .   There was trivial regurgitation. - Left atrium: Moderately dilated. - Pericardium, extracardiac: A trivial pericardial effusion was   identified.  Impressions:  - Technically difficult study. Compared to a prior study in 2017,   the LVEF is higher at 45-50%. There is at least mild aortic   stenosis.  Assessment & Plan    84 yo male with PMH of NICM, CHB s/p PPM, non obstructive CAD, HTN, DM, HL who presented with sudden cardiac arrest.   1. Cardiac Arrest: Had a witnessed cardiac arrest 37 minutes of CPR, 6 epis prior to ROS. Cath back in 2014 showed nonobstructive CAD. EKG shows paced rhythm. No purposeful  movement noted. PCCM admitting and plan for [place]. Lactic Acid 3.87. Would not consider candidate for emergent cath at this time.  -- cycle troponins -- check echo  2. NICM: Hx of EF as low as 35%, but last echo in 4/18 was normal.  -- recheck echo  3. CHB s/p PPM: paced rhythm on EKG  4. CKD III: Baseline Cr 1.5. 1.7 on admission -- follow BMET  Signed, Reino Bellis, NP-C Pager [phone] 08/11/2017, 2:21 PM  Patient seen, examined. Available data reviewed. Agree with findings, assessment, and plan as outlined by Reino Bellis, NP-C.   On my exam the patient is nonresponsive, on the ventilator.  Lungs are clear with diminished air movement throughout.  Heart sounds are distant and regular with no murmur gallop.  Abdomen is soft and obese.  Extremities have minimal edema but there are extensive changes of venous stasis dermatitis.  Lengthy discussion with the patient's wife and daughter regarding his critical illness and goals of care.  They state that he would desire resuscitative efforts that have been performed, but would not want to remain on any long-term supportive care.  He has been in fairly poor health and even at baseline is short of breath with low level activity.  He is not very active anymore.  I reviewed his past cardiac history of nonischemic cardiomyopathy.  He had cardiac catheterization in 2014 with minimal nonobstructive CAD.  I think it is unlikely that he has had an acute ischemic event.  By history it sounds more like he had progressive respiratory distress ultimately resulting in cardiorespiratory arrest.  For now, will ask CCM to admit the patient for continued support.  The patient appears to be a candidate for therapeutic hypothermia based on his arrest scenario.  I do not think there is an indication to take him emergently to the cardiac catheterization lab.  He will receive IV furosemide for diuresis.  Will check an echocardiogram.  Would place him on IV  heparin and cycle cardiac enzymes.  We will follow his care.  Sherren Mocha, M.D. 08/11/2017 4:09 PM

## 2017-08-11 NOTE — ED Triage Notes (Signed)
Ems states patient hasn't been feeling well for approx. 1 week  Was seen by his PCP and dx, with resp infection. Breathing got worse today and family member called 31. cpr was started at 61 Upon FD / EMS arrival agonal resp. On monitor showed v- fib then went to paced rhythm without pulse. 6 epip io insert left tib/fib. King airway placed. Upon roc return 1300.

## 2017-08-11 NOTE — Progress Notes (Signed)
Sat with this family in consult room while they wait for the prognosis report from MD.  Accompany the nurse and then later two MD's to speak with family regarding prognosis of patient.  Patient will be going to ICU according to the availability and according to Ed MD.   [facility] provided for the family. Safe space provided for the family to share stories and family asked for prayer.  Prayed with family.  Family waiting to see patient.  Will follow up as needed.    08/11/17 1511  Clinical Encounter Type  Visited With Family;Health care provider  Visit Type Initial;Spiritual support;Critical Care;ED;Trauma  Spiritual Encounters  Spiritual Needs Prayer;Emotional;Grief support

## 2017-08-11 NOTE — Procedures (Signed)
Arterial Catheter Insertion Procedure Note Malik Rojas [id number] [birth year]  Procedure: Insertion of Arterial Catheter  Indications: Blood pressure monitoring and Frequent blood sampling  Procedure Details Consent: Unable to obtain consent because of emergent medical necessity. Time Out: Verified patient identification, verified procedure, site/side was marked, verified correct patient position, special equipment/implants available, medications/allergies/relevent history reviewed, required imaging and test results available.  Performed  Maximum sterile technique was used including antiseptics, cap, gloves, gown, hand hygiene, mask and sheet. Skin prep: Chlorhexidine; local anesthetic administered 20 gauge catheter was inserted into right radial artery using the Seldinger technique.  Evaluation Blood flow good; BP tracing good. Complications: No apparent complications.   Philomena Doheny 08/11/2017

## 2017-08-11 NOTE — Progress Notes (Signed)
Attempted to call for report.  RN unavailable at that time.

## 2017-08-11 NOTE — Progress Notes (Signed)
eLink Physician-Brief Progress Note Patient Name: Malik Rojas DOB: [birth year] MRN: [id number]   Date of Service  08/11/2017  HPI/Events of Note  Multiple issues: 1. K+ = 3.2 and Creatinine = 1.20 and 2. Troponin #1 = 0.46 in setting of cardiac arrest - Patient is already on ASA and Coreg.   eICU Interventions  Will order: 1. Replace K+. 2. Continue to trend Troponin. 3. Bedside nurse to inform cardiology of Troponin result.      Intervention Category Major Interventions: Electrolyte abnormality - evaluation and management Intermediate Interventions: Diagnostic test evaluation  Loc Feinstein [place] 08/11/2017, 8:39 PM

## 2017-08-11 NOTE — H&P (Signed)
PULMONARY / CRITICAL CARE MEDICINE   Name: Malik Rojas MRN: [id number] DOB: [birth year]    ADMISSION DATE:  08/11/2017 CONSULTATION DATE:  08/11/17  REFERRING MD:  Jeanell Sparrow  CHIEF COMPLAINT:  Cardiac Arrest  HISTORY OF PRESENT ILLNESS:  Pt is encephelopathic; therefore, this HPI is obtained from chart review. Malik Rojas is a 84 y.o. male with PMH as outlined below. He presented to [facility] ED 1/7 after cardiac arrest at home.  Per his wife, he had SOB that morning but wanted to go and work on his car.  He then collapsed and was agonal.  By the time EMS arrived, he was pulseless.  CPR was performed for 37 minutes prior to ROSC.  There was questions whether he initially had PEA or V.fib. Per report, pt had been feeling bad for the past week with respiratory symptoms.  He apparently saw his PCP and was told that he had a respiratory infection.   Prior to arrest, no sudden worsening, no chest pain. No recent fevers/chills/sweats, N/V/D, abd pain, myalgias.  In ED, he remained unresponsive.  Vitals were stable.  He was seen by cardiology and was not felt to be a candidate for emergent cath lab.  Given that his arrest was witnessed, downtime of 37 minutes, continued unresponsive and non-purposeful state post ROSC, he will be started on targeted temperature management with goal 33 degrees.  Wife states that pt is full code; however, he would not want prolonged heroic measures.   PAST MEDICAL HISTORY :  He  has a past medical history of Basal cell carcinoma, CAD (coronary artery disease), Chronic combined systolic and diastolic CHF, NYHA class 4 ([place]) (11/06/2016), Complete heart block ([place]), High cholesterol, HTN (hypertension), cardiovascular stress test, Melanoma ([place]), Obesity, Pneumonia (1940), Presence of permanent cardiac pacemaker, Type II diabetes mellitus ([place]), Urinary frequency, and Urinary incontinence.  PAST SURGICAL HISTORY: He  has a past surgical history that includes Permanent  pacemaker generator change (N/A, 02/12/2013); Mohs surgery (Left); Tonsillectomy; Appendectomy (40 years ago); Transurethral resection of prostate (N/A, 10/13/2014); Insert / replace / remove pacemaker (~ 1999); and Excision basal cell carcinoma.  No Known Allergies  No current facility-administered medications on file prior to encounter.    Current Outpatient Medications on File Prior to Encounter  Medication Sig  . aspirin 81 MG tablet Take 81 mg by mouth daily.  . carvedilol (COREG) 25 MG tablet Take 1 tablet (25 mg total) 2 (two) times daily by mouth.  Marland Kitchen CINNAMON PO Take 1,000 mg by mouth 2 (two) times daily as needed.  . furosemide (LASIX) 40 MG tablet Take 1 tablet (40 mg total) by mouth daily.  Marland Kitchen glucose blood (ONE TOUCH ULTRA TEST) test strip TEST twice a day DX E11.9  . Insulin Glargine (TOUJEO MAX SOLOSTAR) 300 UNIT/ML SOPN Inject 12 Units into the skin daily.  . Insulin Pen Needle (BD ULTRA-FINE MICRO PEN NEEDLE) 32G X 6 MM MISC Patient is to use one pen needle per day DX E11.9  . isosorbide mononitrate (IMDUR) 60 MG 24 hr tablet Take 1 tablet (60 mg total) by mouth daily.  Marland Kitchen lisinopril (PRINIVIL,ZESTRIL) 20 MG tablet Take 1 tablet (20 mg total) by mouth 2 (two) times daily.  . Omega-3 Fatty Acids (FISH OIL) 1000 MG CAPS Take 1,000 mg by mouth daily.   Glory Rosebush DELICA LANCETS 29H MISC TEST TWICE DAILY DX E11.9  . potassium chloride (K-DUR) 10 MEQ tablet Take 10 mEq by mouth daily.  . pravastatin (PRAVACHOL) 80  MG tablet Take 1 tablet (80 mg total) by mouth daily.  . pravastatin (PRAVACHOL) 80 MG tablet take 1 tablet by mouth once daily  . psyllium (REGULOID) 0.52 g capsule Take 0.52 g by mouth daily.  . Pumpkin Seed-Soy Germ (AZO BLADDER CONTROL/GO-LESS) CAPS Take 1 capsule by mouth daily.  . silver sulfADIAZINE (SILVADENE) 1 % cream Apply 1 application topically daily as needed (LEG SORE).  Marland Kitchen sulfamethoxazole-trimethoprim (BACTRIM DS,SEPTRA DS) 800-160 MG tablet Take 1 tablet by  mouth 2 (two) times daily. (Patient not taking: Reported on 07/22/2017)    FAMILY HISTORY:  His indicated that his mother is deceased. He indicated that his father is deceased.   SOCIAL HISTORY: He  reports that he quit smoking about 44 years ago. He has a 90.00 pack-year smoking history. he has never used smokeless tobacco. He reports that he drinks alcohol. He reports that he does not use drugs.  REVIEW OF SYSTEMS:   Unable to obtain as pt is encephalopathic.  SUBJECTIVE:   On vent, unresponsive.  VITAL SIGNS: BP (!) 163/72   Pulse 60   Temp (!) 96.1 F (35.6 C)   Resp 16   Ht 5\' 10"  (1.778 m)   Wt 105.2 kg (231 lb 14.8 oz)   SpO2 99%   BMI 33.28 kg/m   HEMODYNAMICS:    VENTILATOR SETTINGS: Vent Mode: PRVC FiO2 (%):  [100 %] 100 % Set Rate:  [16 bmp] 16 bmp Vt Set:  [590 mL] 590 mL PEEP:  [5 cmH20] 5 cmH20 Plateau Pressure:  [29 cmH20] 29 cmH20  INTAKE / OUTPUT: No intake/output data recorded.   PHYSICAL EXAMINATION: General: Adult male, critically ill. Neuro: Unresponsive. HEENT: [place]/AT. PERRL, sclerae anicteric. Cardiovascular: RRR, no M/R/G.  Lungs: Respirations even and unlabored.  Coarse bilaterally. Abdomen: BS x 4, soft, NT/ND.  Musculoskeletal: No gross deformities, 1+ non-pitting edema, BLE lymphedema. Skin: BLE lymphedema, warm, no rashes.  LABS:  BMET Recent Labs  Lab 08/11/17 1354 08/11/17 1400  NA 142 138  K 4.4 4.6  CL 105 106  CO2  --  25  BUN 29* 27*  CREATININE 1.70* 1.77*  GLUCOSE 253* 259*    Electrolytes Recent Labs  Lab 08/11/17 1400  CALCIUM 7.8*    CBC Recent Labs  Lab 08/11/17 1354 08/11/17 1400  WBC  --  14.4*  HGB 16.0 15.0  HCT 47.0 47.7  PLT  --  223    Coag's Recent Labs  Lab 08/11/17 1400  INR 1.16    Sepsis Markers Recent Labs  Lab 08/11/17 1355 08/11/17 1400  LATICACIDVEN 3.87* 3.4*    ABG No results for input(s): PHART, PCO2ART, PO2ART in the last 168 hours.  Liver Enzymes Recent  Labs  Lab 08/11/17 1400  AST 112*  ALT 63  ALKPHOS 76  BILITOT 1.0  ALBUMIN 3.0*    Cardiac Enzymes No results for input(s): TROPONINI, PROBNP in the last 168 hours.  Glucose No results for input(s): GLUCAP in the last 168 hours.  Imaging Dg Chest Portable 1 View  Result Date: 08/11/2017 CLINICAL DATA:  Intubation EXAM: PORTABLE CHEST 1 VIEW COMPARISON:  11/07/2016 FINDINGS: Endotracheal tube in good position. Duo lead pacemaker unchanged in position. NG tube in the stomach. Cardiac enlargement. Diffuse bilateral airspace disease most consistent with edema. Small right effusion. Bibasilar atelectasis. IMPRESSION: Endotracheal tube in good position Diffuse bilateral airspace disease most consistent with pulmonary edema. Electronically Signed   By: Franchot Gallo M.D.   On: 08/11/2017 14:38  STUDIES:  CXR 1/7 > pulmonary edema. Echo 1/7 >  EEG 1/7 >  LE duplex 1/7 >  CT head 1/7 >   CULTURES: Blood 1/7 >  Sputum 1/7 >   ANTIBIOTICS: None.  SIGNIFICANT EVENTS: 1/7 > admit.  LINES/TUBES: ETT 1/7 >  R fem CVL 1/7 >  Art line pending 1/7 >   DISCUSSION: 85 y.o. male admitted 1/7 with cardiac arrest.  Likely respiratory leading to cardiac.  ASSESSMENT / PLAN:  PULMONARY A: Acute hypoxic respiratory failure - presumed acute pulmonary edema. P:   Full vent support. Wean as able. VAP prevention measures. Hold SBT until off NMB. 40mg  lasix now. Assess LE duplex for completeness. CXR in AM.  CARDIOVASCULAR A:  Cardiac Arrest - presumed initial respiratory leading to cardiac. Hx HTN, HLD, CHB, combined heart failure (Echo from April 2018 with EF 45 - 50%, G1DD), CAD. P:  Start TTM, goal 33 degrees. Continue levophed PRN, goal MAP > 80. Assess CVP's, echo. Trend troponin. Cardiology following. 40mg  lasix now. Continue preadmission ASA, pravastatin. Hold preadmission carvedilol, furosemide, imdur, lisinopril.  RENAL A:   AoCKD. At risk for multiple  metabolic derangements during cooling. P:   Correct electrolytes as indicated. BMP q2hrs x 4 then q4hrs.  Hold preadmission lisinopril.  GASTROINTESTINAL A:   GI prophylaxis. Nutrition. P:   SUP: Pantoprazole. NPO. Start TF's.   HEMATOLOGIC A:   VTE Prophylaxis. P:  SCD's / heparin Coags q8hrs x 2. CBC in AM.  INFECTIOUS A:   No indication of infection. P:   Monitor clinically.  ENDOCRINE A:   Hx DM. P:   ICU hyperglycemia protocol. Hold preadmission glargine.  NEUROLOGIC A:   At risk for anoxic encephalopathy. P:   Assess CT head. Sedation:  Propofol / Fentanyl / Cisatracurium RASS goal: -5 until off NMB.  Hold daily WUA until off NMB. Assess EEG. Neuro consult once rewarmed.  Family updated: Wife updated by Dr. Burt Knack from cardiology.  Interdisciplinary Family Meeting v Palliative Care Meeting:  Due by: 08/17/17.  CC time: 40 min.   Montey Hora, [facility] Pager: [phone]  or [phone] 08/11/2017, 2:53 PM

## 2017-08-11 NOTE — ED Notes (Signed)
[facility] called @[phone]  for code cool

## 2017-08-11 NOTE — Progress Notes (Signed)
Patient brought in via EMS as a post CPR.  Patient had a king airway in place on arrival.  ED MD exchanged king airway with a size 8.0 ETT.  Positive color change noted.  Bilateral breath sounds auscultated.  Condensation noted in ETT.  Chest xray pending for tube placement.  Patient tolerated well.  Will continue to monitor.

## 2017-08-11 NOTE — ED Notes (Signed)
Ice packs applied to groins

## 2017-08-11 NOTE — ED Notes (Signed)
Foley site area inspected. Appears fine.

## 2017-08-11 NOTE — Progress Notes (Signed)
Patient transported from ED to CT and to room 1D17 without complications.

## 2017-08-12 ENCOUNTER — Inpatient Hospital Stay (HOSPITAL_COMMUNITY): Payer: Medicare Other

## 2017-08-12 DIAGNOSIS — I351 Nonrheumatic aortic (valve) insufficiency: Secondary | ICD-10-CM

## 2017-08-12 DIAGNOSIS — I469 Cardiac arrest, cause unspecified: Secondary | ICD-10-CM

## 2017-08-12 DIAGNOSIS — J9602 Acute respiratory failure with hypercapnia: Secondary | ICD-10-CM

## 2017-08-12 LAB — BLOOD CULTURE ID PANEL (REFLEXED)
Acinetobacter baumannii: NOT DETECTED
CANDIDA GLABRATA: NOT DETECTED
CANDIDA KRUSEI: NOT DETECTED
Candida albicans: NOT DETECTED
Candida parapsilosis: NOT DETECTED
Candida tropicalis: NOT DETECTED
ENTEROBACTER CLOACAE COMPLEX: NOT DETECTED
ESCHERICHIA COLI: NOT DETECTED
Enterobacteriaceae species: NOT DETECTED
Enterococcus species: NOT DETECTED
Haemophilus influenzae: NOT DETECTED
Klebsiella oxytoca: NOT DETECTED
Klebsiella pneumoniae: NOT DETECTED
Listeria monocytogenes: NOT DETECTED
Methicillin resistance: DETECTED — AB
NEISSERIA MENINGITIDIS: NOT DETECTED
PSEUDOMONAS AERUGINOSA: NOT DETECTED
Proteus species: NOT DETECTED
STAPHYLOCOCCUS AUREUS BCID: NOT DETECTED
STREPTOCOCCUS AGALACTIAE: NOT DETECTED
STREPTOCOCCUS PNEUMONIAE: NOT DETECTED
STREPTOCOCCUS SPECIES: NOT DETECTED
Serratia marcescens: NOT DETECTED
Staphylococcus species: DETECTED — AB
Streptococcus pyogenes: NOT DETECTED

## 2017-08-12 LAB — POCT I-STAT, CHEM 8
BUN: 26 mg/dL — ABNORMAL HIGH (ref 6–20)
BUN: 26 mg/dL — ABNORMAL HIGH (ref 6–20)
BUN: 27 mg/dL — AB (ref 6–20)
BUN: 27 mg/dL — ABNORMAL HIGH (ref 6–20)
BUN: 33 mg/dL — ABNORMAL HIGH (ref 6–20)
BUN: 35 mg/dL — ABNORMAL HIGH (ref 6–20)
CALCIUM ION: 1.07 mmol/L — AB (ref 1.15–1.40)
CALCIUM ION: 1.09 mmol/L — AB (ref 1.15–1.40)
CALCIUM ION: 1.09 mmol/L — AB (ref 1.15–1.40)
CALCIUM ION: 1.1 mmol/L — AB (ref 1.15–1.40)
CALCIUM ION: 1.11 mmol/L — AB (ref 1.15–1.40)
CHLORIDE: 105 mmol/L (ref 101–111)
CHLORIDE: 108 mmol/L (ref 101–111)
CREATININE: 1.6 mg/dL — AB (ref 0.61–1.24)
CREATININE: 1.6 mg/dL — AB (ref 0.61–1.24)
CREATININE: 1.6 mg/dL — AB (ref 0.61–1.24)
Calcium, Ion: 1.09 mmol/L — ABNORMAL LOW (ref 1.15–1.40)
Chloride: 107 mmol/L (ref 101–111)
Chloride: 109 mmol/L (ref 101–111)
Chloride: 109 mmol/L (ref 101–111)
Chloride: 109 mmol/L (ref 101–111)
Creatinine, Ser: 1.7 mg/dL — ABNORMAL HIGH (ref 0.61–1.24)
Creatinine, Ser: 1.8 mg/dL — ABNORMAL HIGH (ref 0.61–1.24)
Creatinine, Ser: 2 mg/dL — ABNORMAL HIGH (ref 0.61–1.24)
GLUCOSE: 194 mg/dL — AB (ref 65–99)
GLUCOSE: 117 mg/dL — AB (ref 65–99)
Glucose, Bld: 110 mg/dL — ABNORMAL HIGH (ref 65–99)
Glucose, Bld: 111 mg/dL — ABNORMAL HIGH (ref 65–99)
Glucose, Bld: 150 mg/dL — ABNORMAL HIGH (ref 65–99)
Glucose, Bld: 193 mg/dL — ABNORMAL HIGH (ref 65–99)
HCT: 42 % (ref 39.0–52.0)
HCT: 42 % (ref 39.0–52.0)
HCT: 44 % (ref 39.0–52.0)
HEMATOCRIT: 43 % (ref 39.0–52.0)
HEMATOCRIT: 41 % (ref 39.0–52.0)
HEMATOCRIT: 42 % (ref 39.0–52.0)
HEMOGLOBIN: 14.6 g/dL (ref 13.0–17.0)
HEMOGLOBIN: 13.9 g/dL (ref 13.0–17.0)
Hemoglobin: 14.3 g/dL (ref 13.0–17.0)
Hemoglobin: 14.3 g/dL (ref 13.0–17.0)
Hemoglobin: 14.3 g/dL (ref 13.0–17.0)
Hemoglobin: 15 g/dL (ref 13.0–17.0)
POTASSIUM: 3.5 mmol/L (ref 3.5–5.1)
POTASSIUM: 3.5 mmol/L (ref 3.5–5.1)
Potassium: 3.4 mmol/L — ABNORMAL LOW (ref 3.5–5.1)
Potassium: 3.5 mmol/L (ref 3.5–5.1)
Potassium: 3.9 mmol/L (ref 3.5–5.1)
Potassium: 3.9 mmol/L (ref 3.5–5.1)
SODIUM: 141 mmol/L (ref 135–145)
SODIUM: 141 mmol/L (ref 135–145)
SODIUM: 142 mmol/L (ref 135–145)
SODIUM: 142 mmol/L (ref 135–145)
SODIUM: 143 mmol/L (ref 135–145)
Sodium: 141 mmol/L (ref 135–145)
TCO2: 19 mmol/L — AB (ref 22–32)
TCO2: 20 mmol/L — AB (ref 22–32)
TCO2: 20 mmol/L — AB (ref 22–32)
TCO2: 21 mmol/L — AB (ref 22–32)
TCO2: 21 mmol/L — ABNORMAL LOW (ref 22–32)
TCO2: 22 mmol/L (ref 22–32)

## 2017-08-12 LAB — BASIC METABOLIC PANEL
Anion gap: 10 (ref 5–15)
Anion gap: 11 (ref 5–15)
BUN: 28 mg/dL — ABNORMAL HIGH (ref 6–20)
BUN: 28 mg/dL — AB (ref 6–20)
CHLORIDE: 108 mmol/L (ref 101–111)
CHLORIDE: 109 mmol/L (ref 101–111)
CO2: 20 mmol/L — AB (ref 22–32)
CO2: 20 mmol/L — AB (ref 22–32)
CREATININE: 1.64 mg/dL — AB (ref 0.61–1.24)
CREATININE: 1.66 mg/dL — AB (ref 0.61–1.24)
Calcium: 7.9 mg/dL — ABNORMAL LOW (ref 8.9–10.3)
Calcium: 8.2 mg/dL — ABNORMAL LOW (ref 8.9–10.3)
GFR calc Af Amer: 42 mL/min — ABNORMAL LOW (ref >60)
GFR calc Af Amer: 43 mL/min — ABNORMAL LOW (ref >60)
GFR calc non Af Amer: 37 mL/min — ABNORMAL LOW (ref >60)
GFR calc non Af Amer: 37 mL/min — ABNORMAL LOW (ref >60)
Glucose, Bld: 141 mg/dL — ABNORMAL HIGH (ref 65–99)
Glucose, Bld: 152 mg/dL — ABNORMAL HIGH (ref 65–99)
POTASSIUM: 3.9 mmol/L (ref 3.5–5.1)
POTASSIUM: 4 mmol/L (ref 3.5–5.1)
SODIUM: 139 mmol/L (ref 135–145)
Sodium: 139 mmol/L (ref 135–145)

## 2017-08-12 LAB — GLUCOSE, CAPILLARY
GLUCOSE-CAPILLARY: 138 mg/dL — AB (ref 65–99)
GLUCOSE-CAPILLARY: 150 mg/dL — AB (ref 65–99)
Glucose-Capillary: 101 mg/dL — ABNORMAL HIGH (ref 65–99)
Glucose-Capillary: 112 mg/dL — ABNORMAL HIGH (ref 65–99)
Glucose-Capillary: 131 mg/dL — ABNORMAL HIGH (ref 65–99)
Glucose-Capillary: 137 mg/dL — ABNORMAL HIGH (ref 65–99)
Glucose-Capillary: 161 mg/dL — ABNORMAL HIGH (ref 65–99)

## 2017-08-12 LAB — ECHOCARDIOGRAM COMPLETE
HEIGHTINCHES: 70 in
WEIGHTICAEL: 3663.52 [oz_av]

## 2017-08-12 LAB — PROTIME-INR
INR: 1.11
PROTHROMBIN TIME: 14.2 s (ref 11.4–15.2)

## 2017-08-12 LAB — MRSA PCR SCREENING: MRSA by PCR: NEGATIVE

## 2017-08-12 LAB — APTT: aPTT: 30 seconds (ref 24–36)

## 2017-08-12 LAB — TROPONIN I
Troponin I: 0.31 ng/mL (ref <0.03)
Troponin I: 0.39 ng/mL (ref <0.03)
Troponin I: 0.45 ng/mL (ref <0.03)

## 2017-08-12 LAB — MAGNESIUM: Magnesium: 1.7 mg/dL (ref 1.7–2.4)

## 2017-08-12 MED ORDER — POTASSIUM CHLORIDE 10 MEQ/50ML IV SOLN
10.0000 meq | INTRAVENOUS | Status: AC
  Administered 2017-08-12 (×2): 10 meq via INTRAVENOUS
  Filled 2017-08-12 (×2): qty 50

## 2017-08-12 MED ORDER — PNEUMOCOCCAL VAC POLYVALENT 25 MCG/0.5ML IJ INJ: 0.5000 mL | INJECTION | INTRAMUSCULAR | Status: DC | PRN

## 2017-08-12 MED ORDER — INSULIN ASPART 100 UNIT/ML ~~LOC~~ SOLN
2.0000 [IU] | SUBCUTANEOUS | Status: DC
  Administered 2017-08-12: 2 [IU] via SUBCUTANEOUS
  Administered 2017-08-12: 4 [IU] via SUBCUTANEOUS

## 2017-08-12 MED ORDER — POTASSIUM CHLORIDE 10 MEQ/50ML IV SOLN
10.0000 meq | INTRAVENOUS | Status: AC
  Administered 2017-08-12 (×4): 10 meq via INTRAVENOUS
  Filled 2017-08-12 (×4): qty 50

## 2017-08-12 MED ORDER — VANCOMYCIN HCL 10 G IV SOLR
2000.0000 mg | Freq: Once | INTRAVENOUS | Status: AC
  Administered 2017-08-12: 2000 mg via INTRAVENOUS
  Filled 2017-08-12: qty 2000

## 2017-08-12 MED ORDER — NOREPINEPHRINE BITARTRATE 1 MG/ML IV SOLN
0.0000 ug/min | INTRAVENOUS | Status: DC
  Administered 2017-08-12: 7.2 ug/min via INTRAVENOUS
  Administered 2017-08-13: 21 ug/min via INTRAVENOUS
  Filled 2017-08-12 (×2): qty 16

## 2017-08-12 MED ORDER — VANCOMYCIN HCL 10 G IV SOLR
1250.0000 mg | INTRAVENOUS | Status: DC
  Filled 2017-08-12: qty 1250

## 2017-08-12 MED ORDER — INSULIN ASPART 100 UNIT/ML ~~LOC~~ SOLN
2.0000 [IU] | SUBCUTANEOUS | Status: DC
  Administered 2017-08-13 (×3): 2 [IU] via SUBCUTANEOUS
  Administered 2017-08-14: 4 [IU] via SUBCUTANEOUS
  Administered 2017-08-14 (×2): 2 [IU] via SUBCUTANEOUS
  Administered 2017-08-14 (×2): 4 [IU] via SUBCUTANEOUS
  Administered 2017-08-14: 2 [IU] via SUBCUTANEOUS
  Administered 2017-08-15 (×5): 4 [IU] via SUBCUTANEOUS
  Administered 2017-08-16 (×3): 6 [IU] via SUBCUTANEOUS
  Administered 2017-08-16: 4 [IU] via SUBCUTANEOUS
  Administered 2017-08-16 – 2017-08-17 (×7): 6 [IU] via SUBCUTANEOUS
  Administered 2017-08-17: 4 [IU] via SUBCUTANEOUS
  Administered 2017-08-18 (×3): 6 [IU] via SUBCUTANEOUS
  Administered 2017-08-18: 4 [IU] via SUBCUTANEOUS
  Administered 2017-08-18 – 2017-08-19 (×3): 6 [IU] via SUBCUTANEOUS
  Administered 2017-08-19: 4 [IU] via SUBCUTANEOUS
  Administered 2017-08-19 (×2): 2 [IU] via SUBCUTANEOUS
  Administered 2017-08-19: 6 [IU] via SUBCUTANEOUS
  Administered 2017-08-20: 4 [IU] via SUBCUTANEOUS
  Administered 2017-08-20: 6 [IU] via SUBCUTANEOUS
  Administered 2017-08-20 (×3): 2 [IU] via SUBCUTANEOUS
  Administered 2017-08-20: 6 [IU] via SUBCUTANEOUS
  Administered 2017-08-21: 4 [IU] via SUBCUTANEOUS
  Administered 2017-08-21: 6 [IU] via SUBCUTANEOUS
  Administered 2017-08-21 (×2): 2 [IU] via SUBCUTANEOUS
  Administered 2017-08-21: 6 [IU] via SUBCUTANEOUS

## 2017-08-12 MED ORDER — INSULIN GLARGINE 100 UNIT/ML ~~LOC~~ SOLN
10.0000 [IU] | Freq: Every day | SUBCUTANEOUS | Status: DC
  Administered 2017-08-12: 10 [IU] via SUBCUTANEOUS
  Filled 2017-08-12: qty 0.1

## 2017-08-12 MED ORDER — PRO-STAT SUGAR FREE PO LIQD: 60.0000 mL | Freq: Four times a day (QID) | ORAL | Status: DC

## 2017-08-12 MED ORDER — VITAL HIGH PROTEIN PO LIQD: 1000.0000 mL | ORAL | Status: DC

## 2017-08-12 MED ORDER — DEXTROSE 10 % IV SOLN
INTRAVENOUS | Status: DC | PRN
Start: 2017-08-12 — End: 2017-08-12
  Administered 2017-08-12: 05:00:00 via INTRAVENOUS

## 2017-08-12 MED ORDER — PIPERACILLIN-TAZOBACTAM 3.375 G IVPB
3.3750 g | Freq: Three times a day (TID) | INTRAVENOUS | Status: DC
  Administered 2017-08-12 – 2017-08-14 (×6): 3.375 g via INTRAVENOUS
  Filled 2017-08-12 (×7): qty 50

## 2017-08-12 MED ORDER — MAGNESIUM SULFATE 2 GM/50ML IV SOLN
2.0000 g | Freq: Once | INTRAVENOUS | Status: AC
  Administered 2017-08-12: 2 g via INTRAVENOUS
  Filled 2017-08-12: qty 50

## 2017-08-12 MED ORDER — INFLUENZA VAC SPLIT HIGH-DOSE 0.5 ML IM SUSY: 0.5000 mL | PREFILLED_SYRINGE | INTRAMUSCULAR | Status: DC | PRN

## 2017-08-12 NOTE — Progress Notes (Signed)
Patient having increased ectopy again with PVCs/couplets/bigeminy. Chem8 drawn: K 3.4. Dr. Oletta Darter called and orders received for replacement.

## 2017-08-12 NOTE — Procedures (Signed)
ELECTROENCEPHALOGRAM REPORT  Date of Study: 08/12/2017  Patient's Name: Malik Rojas MRN: [id number] Date of Birth: 05/09/1932  Referring Provider: Montey Hora, PA-C  Clinical History: This is an 84 year old man s/p cardiac arrest, on hypothermia protocol.  Medications: Propofol Fentanyl Nimbex  Technical Summary: A multichannel digital EEG recording measured by the international 10-20 system with electrodes applied with paste and impedances below 5000 ohms performed in our laboratory with EKG monitoring in an intubated and sedated patient on hypothermia protocol at 32.9 degrees Celsius .  Hyperventilation and photic stimulation were not performed.  The digital EEG was referentially recorded, reformatted, and digitally filtered in a variety of bipolar and referential montages for optimal display.    Description: The patient is intubated and sedated on Propofol and Fentanyl during the recording. There is loss of normal background activity. There is a burst suppression pattern seen with 1-5 second bursts of diffuse theta activity admixed with beta activity in between diffuse background suppression lasting 1-8 seconds. There is no spontaneous reactivity noted. Hyperventilation and photic stimulation were not performed. There were no epileptiform discharges or electrographic seizures seen.   EKG lead was unremarkable.  Impression: This EEG is markedly abnormal due to burst-suppression pattern.   Clinical Correlation of the above findings indicates severe diffuse cerebral dysfunction that is non-specific in etiology and can be seen in the setting of anoxic/ischemic injury, toxic/metabolic encephalopathies, or medication effect from Propofol and Fentanyl. There were no electrographic seizures in this study. Clinical correlation is advised.   Ellouise Newer, M.D.

## 2017-08-12 NOTE — Progress Notes (Signed)
[facility] Progress Note Patient Name: Malik Rojas DOB: April 16, 1932 MRN: [id number]   Date of Service  08/12/2017  HPI/Events of Note  Frequent PVC's - Last K+ = 4.0 and Creatinine = 1.60.   eICU Interventions  Will order: 1. Replace K+. 2. Mg++ level STAT.     Intervention Category Major Interventions: Arrhythmia - evaluation and management  Oda Placke [place] 08/12/2017, 1:57 AM

## 2017-08-12 NOTE — Progress Notes (Signed)
  Echocardiogram 2D Echocardiogram has been performed.  Malik Rojas 08/12/2017, 3:01 PM

## 2017-08-12 NOTE — Progress Notes (Signed)
[facility] Progress Note Patient Name: Malik Rojas DOB: [birth year] MRN: [id number]   Date of Service  08/12/2017  HPI/Events of Note  Mg++ = 1.7 and Creatinine = 1.60.  eICU Interventions  Will replace Mg++.      Intervention Category Major Interventions: Electrolyte abnormality - evaluation and management  [facility] [place] 08/12/2017, 2:51 AM

## 2017-08-12 NOTE — Progress Notes (Signed)
[facility] Progress Note Patient Name: Malik Rojas DOB: [birth year] MRN: [id number]   Date of Service  08/12/2017  HPI/Events of Note  K+ = 3.4 and Creatinine = 1.6.   eICU Interventions  Will replace K+.      Intervention Category Major Interventions: Electrolyte abnormality - evaluation and management  Latrel Szymczak [place] 08/12/2017, 6:25 AM

## 2017-08-12 NOTE — Progress Notes (Signed)
Initial Nutrition Assessment  DOCUMENTATION CODES:   Obesity unspecified  INTERVENTION:   Tube Feeding:  Vital High Protein @ 15 ml/hr Pro-Stat 60 mL QID Provides 1160 kcals, 152 g of protein and 302 mL of free water. Meets 100% estimated protein needs TF regimen and propofol at current rate providing 2158 kcals total kcal/day  NUTRITION DIAGNOSIS:   Inadequate oral intake related to acute illness as evidenced by NPO status.  GOAL:   Provide needs based on ASPEN/SCCM guidelines  MONITOR:   Vent status, Labs, TF tolerance, Weight trends, Skin  REASON FOR ASSESSMENT:   Consult, Ventilator Enteral/tube feeding initiation and management  ASSESSMENT:    84 yo male admitted post cardiac arrest with acute hypoxic respiratory failure, acute pulmoary edema, acute on CKD. Pt with hx of basal cell carcinoma, CAD, CHF, HTN, DM   Pt currently sedated, paralyzed on vent  TTM, goal 33 degrees Diprivan: 37.8 ml/hr (998 kcals) OG tube, abdomen soft, obese, BS hypoactive   Labs: reviewed Meds: lasix, lantus, diprivan, mibex  NUTRITION - FOCUSED PHYSICAL EXAM:    Most Recent Value  Orbital Region  No depletion  Upper Arm Region  No depletion  Thoracic and Lumbar Region  Unable to assess  Buccal Region  Unable to assess  Temple Region  No depletion  Clavicle Bone Region  No depletion  Clavicle and Acromion Bone Region  No depletion  Scapular Bone Region  No depletion  Dorsal Hand  No depletion  Patellar Region  Unable to assess  Anterior Thigh Region  Unable to assess  Posterior Calf Region  Unable to assess  Edema (RD Assessment)  Mild      Diet Order:  No diet orders on file  EDUCATION NEEDS:   Not appropriate for education at this time  Skin:  Skin Assessment: Reviewed RN Assessment  Last BM:  no documented BM  Height:   Ht Readings from Last 1 Encounters:  08/11/17 5\' 10"  (1.778 m)    Weight:   Wt Readings from Last 1 Encounters:  08/12/17 228 lb 15.5  oz (103.9 kg)    Ideal Body Weight:  75.5 kg  BMI:  Body mass index is 32.85 kg/m.  Estimated Nutritional Needs:   Kcal:  9604-5409 kcals  Protein:  152-190 g  Fluid:  >/= 2 L   Kerman Passey MS, RD, LDN, CNSC [phone] Pager  [phone] Weekend/On-Call Pager

## 2017-08-12 NOTE — Progress Notes (Signed)
Progress Note  Patient Name: Malik Rojas Date of Encounter: 08/12/2017  Primary Cardiologist: No primary care provider on file.   Subjective   Pt sedated. Unable to voice complaints.   Inpatient Medications    Scheduled Meds: . artificial tears  1 application Both Eyes H9Q  . aspirin  81 mg Per Tube Daily  . carvedilol  25 mg Per Tube BID  . chlorhexidine gluconate (MEDLINE KIT)  15 mL Mouth Rinse BID  . Chlorhexidine Gluconate Cloth  6 each Topical Daily  . furosemide  40 mg Intravenous Q12H  . heparin injection (subcutaneous)  5,000 Units Subcutaneous Q8H  . insulin aspart  2-6 Units Subcutaneous Q4H  . insulin glargine  10 Units Subcutaneous Daily  . mouth rinse  15 mL Mouth Rinse 10 times per day  . pantoprazole (PROTONIX) IV  40 mg Intravenous QHS  . pravastatin  80 mg Per Tube q1800  . sodium chloride flush  10-40 mL Intracatheter Q12H   Continuous Infusions: . cisatracurium (NIMBEX) infusion 1.5 mcg/kg/min (08/11/17 1915)  . dextrose Stopped (08/12/17 0730)  . fentaNYL infusion INTRAVENOUS 300 mcg/hr (08/12/17 0914)  . norepinephrine (LEVOPHED) Adult infusion    . potassium chloride 10 mEq (08/12/17 1009)  . propofol (DIPRIVAN) infusion 30 mcg/kg/min (08/12/17 0714)   PRN Meds: albuterol, [COMPLETED] cisatracurium **AND** cisatracurium (NIMBEX) infusion **AND** cisatracurium, dextrose, fentaNYL, hydrALAZINE, Influenza vac split quadrivalent PF, pneumococcal 23 valent vaccine, sodium chloride flush   Vital Signs    Vitals:   08/12/17 0645 08/12/17 0700 08/12/17 0753 08/12/17 0800  BP: 121/67 (!) 162/77 (!) 137/57 (!) 127/100  Pulse: (!) 57 60 61 60  Resp: _0 Temp:  (!) 91.8 F (33.2 C) (!) 91.6 F (33.1 C) (!) 91.6 F (33.1 C)  TempSrc:    Bladder  SpO2: 97% 98% 98% 97%  Weight:      Height:        Intake/Output Summary (Last 24 hours) at 08/12/2017 1021 Last data filed at 08/12/2017 0800 Gross per 24 hour  Intake 1722.65 ml  Output 4395  ml  Net -2672.35 ml   Filed Weights   08/11/17 1400 08/11/17 1710 08/12/17 0400  Weight: 231 lb 14.8 oz (105.2 kg) 230 lb 6.1 oz (104.5 kg) 228 lb 15.5 oz (103.9 kg)    Telemetry    AV paced rhythm - Personally Reviewed  ECG    AV paced - Personally Reviewed  Physical Exam   GEN: Sedated on vent Neck: No JVD Cardiac: RRR, no murmurs, rubs, or gallops.  Respiratory: Clear to auscultation bilaterally  GI: Soft, nontender, non-distended  Ext: no LE edema.    Labs    Chemistry Recent Labs  Lab 08/11/17 1400  08/11/17 1808  08/11/17 2337 08/11/17 2349 08/12/17 0352 08/12/17 0622  NA 138   < > 138   < > 139 143 139 141  K 4.6   < > 4.2   < > 3.9 4.0 4.0 3.4*  CL 106   < > 109   < > 108 106 109 105  CO2 25  --  20*  --  20*  --  20*  --   GLUCOSE 259*   < > 219*   < > 141* 152* 152* 194*  BUN 27*   < > 28*   < > 28* 28* 28* 27*  CREATININE 1.77*   < > 1.54*   < > 1.66* 1.60* 1.64* 1.60*  CALCIUM 7.8*  --  7.3*  --  7.9*  --  8.2*  --   PROT 6.0*  --   --   --   --   --   --   --   ALBUMIN 3.0*  --   --   --   --   --   --   --   AST 112*  --   --   --   --   --   --   --   ALT 63  --   --   --   --   --   --   --   ALKPHOS 76  --   --   --   --   --   --   --   BILITOT 1.0  --   --   --   --   --   --   --   GFRNONAA 34*  --  40*  --  37*  --  37*  --   GFRAA 39*  --  46*  --  42*  --  43*  --   ANIONGAP 7  --  9  --  11  --  10  --    < > = values in this interval not displayed.     Hematology Recent Labs  Lab 08/11/17 1400  08/11/17 1808  08/11/17 2204 08/11/17 2349 08/12/17 0622  WBC 14.4*  --  20.2*  --   --   --   --   RBC 4.89  --  4.71  --   --   --   --   HGB 15.0   < > 14.7   < > 15.0 14.6 15.0  HCT 47.7   < > 44.7   < > 44.0 43.0 44.0  MCV 97.5  --  94.9  --   --   --   --   MCH 30.7  --  31.2  --   --   --   --   MCHC 31.4  --  32.9  --   --   --   --   RDW 12.9  --  12.9  --   --   --   --   PLT 223  --  199  --   --   --   --    < > =  values in this interval not displayed.    Cardiac Enzymes Recent Labs  Lab 08/11/17 1808 08/11/17 2337 08/12/17 0352 08/12/17 0801  TROPONINI 0.46* 0.45* 0.39* 0.31*    Recent Labs  Lab 08/11/17 1352  TROPIPOC 0.10*     BNPNo results for input(s): BNP, PROBNP in the last 168 hours.   DDimer No results for input(s): DDIMER in the last 168 hours.   Radiology    Ct Head Wo Contrast  Result Date: 08/11/2017 CLINICAL DATA:  84 year old male post cardiac arrest with 37 minutes of CPR before return of spontaneous circulation. Initial encounter. EXAM: CT HEAD WITHOUT CONTRAST TECHNIQUE: Contiguous axial images were obtained from the base of the skull through the vertex without intravenous contrast. COMPARISON:  None. FINDINGS: Brain: No intracranial hemorrhage. Slightly poor definition anterolateral aspect of the thalamus and posterior lenticular nucleus bilaterally may indicate changes of hypoxia. Prominent chronic microvascular changes. Infarct anterior limb left internal capsule of indeterminate age. Global atrophy. No intracranial mass lesion noted on this unenhanced exam. Vascular: Vascular calcifications Skull: No skull fracture. Sinuses/Orbits: Exophthalmos. Opacification left frontal sinus,  anterior ethmoid sinus air cells bilaterally and left maxillary sinus. Other: Mastoid air cells and middle ear cavities are clear IMPRESSION: No intracranial hemorrhage. Slightly poor definition anterolateral aspect of the thalamus and posterior lenticular nucleus bilaterally may indicate changes of hypoxia. Prominent chronic microvascular changes. Infarct anterior limb left internal capsule of indeterminate age. Global atrophy. Opacification left frontal sinus, anterior ethmoid sinus air cells bilaterally and left maxillary sinus. Electronically Signed   By: Genia Del M.D.   On: 08/11/2017 16:51   Dg Chest Portable 1 View  Result Date: 08/11/2017 CLINICAL DATA:  Intubation EXAM: PORTABLE CHEST 1  VIEW COMPARISON:  11/07/2016 FINDINGS: Endotracheal tube in good position. Duo lead pacemaker unchanged in position. NG tube in the stomach. Cardiac enlargement. Diffuse bilateral airspace disease most consistent with edema. Small right effusion. Bibasilar atelectasis. IMPRESSION: Endotracheal tube in good position Diffuse bilateral airspace disease most consistent with pulmonary edema. Electronically Signed   By: Franchot Gallo M.D.   On: 08/11/2017 14:38    Cardiac Studies     Patient Profile     84 y.o. male with history of NICM, complete heart block s/p PPM, CAD, HTN, DM, HLD admitted 08/11/16 post out of hospital cardiac arrest.   Assessment & Plan    1. Cardiac arrest: Mild troponin elevation in setting of known mild CAD, mild LV systolic dysfunction (by echo April 2018) and prolonged down time during cardiac arrest. He is being cooled by the PCCM team. Will continue supportive care for now. Echo later today. CVP is now 5. Can probably hold Lasix today. We will follow with you.   For questions or updates, please contact [facility] Please consult [email or web address] for contact info under Cardiology/STEMI.      Signed, Lauree Chandler, MD  08/12/2017, 10:21 AM

## 2017-08-12 NOTE — Progress Notes (Signed)
Spoke with MD Mannam regarding patient's tube feed orders, MD Mannam advised to wait to begin tube feeds until 1/9 as patient is on paralytic.

## 2017-08-12 NOTE — Progress Notes (Signed)
Pharmacy Antibiotic Note  Malik Rojas is a 84 y.o. male s/p cardiac arrest now with 1/2 blood cultures positive for CoNS. Has ICD in place. Also possible aspiration pneumonia. Pharmacy has been consulted for vancomycin and zosyn dosing. Renal insufficiency noted with Cr 1.6 and CrCl 42 ml/min.  Vancomycin trough goal 15-20  Plan: 1) Vancomycin 2g IV x 1 then 1250mg  IV q24 2) Zosyn 3.375g IV q8 (4 hour infusion) 3) Follow renal function, cultures, LOT, level as needed  Height: 5\' 10"  (177.8 cm) Weight: 228 lb 15.5 oz (103.9 kg) IBW/kg (Calculated) : 73  Temp (24hrs), Avg:92.5 F (33.6 C), Min:90 F (32.2 C), Max:96.3 F (35.7 C)  Recent Labs  Lab 08/11/17 1355 08/11/17 1400  08/11/17 1808  08/11/17 2349 08/12/17 0352 08/12/17 0622 08/12/17 0805 08/12/17 1153  WBC  --  14.4*  --  20.2*  --   --   --   --   --   --   CREATININE  --  1.77*   < > 1.54*   < > 1.60* 1.64* 1.60* 1.60* 1.60*  LATICACIDVEN 3.87* 3.4*  --   --   --   --   --   --   --   --    < > = values in this interval not displayed.    Estimated Creatinine Clearance: 42.3 mL/min (A) (by C-G formula based on SCr of 1.6 mg/dL (H)).    No Known Allergies  Antimicrobials this admission: 1/8 Vancomycin >> 1/8 Zosyn >>  Microbiology results: 1/7 blood >> 1/2 CoNS 1/7 MRSA PCR negative  Thank you for allowing pharmacy to be a part of this patient's care.  Deboraha Sprang 08/12/2017 2:04 PM

## 2017-08-12 NOTE — Progress Notes (Signed)
PHARMACY - PHYSICIAN COMMUNICATION CRITICAL VALUE ALERT - BLOOD CULTURE IDENTIFICATION (BCID)  Malik Rojas is an 84 y.o. male who presented to [facility] on 08/11/2017 with a chief complaint of cardiac arrest.  Assessment: The patient came in with cardiac arrest with 37 minutes of down-time. Currently undergoing hypothermia protocol. The patient did have an ICD in place PTA - so should be evaluated for infection - thought 1/2 Coag Neg Staph could also be a contamination.   Name of physician (or Provider) Contacted: Richardson Landry Minor - CCM  Current antibiotics: None  Changes to prescribed antibiotics recommended:  After discussing with the CCM-NP, he wanted to add Vancomycin to cover for possible true pathogen given ICD in place and ALSO to add Zosyn for potential aspiration given cardiac arrest.  Results for orders placed or performed during the hospital encounter of 08/11/17  Blood Culture ID Panel (Reflexed) (Collected: 08/11/2017  2:56 PM)  Result Value Ref Range   Enterococcus species NOT DETECTED NOT DETECTED   Listeria monocytogenes NOT DETECTED NOT DETECTED   Staphylococcus species DETECTED (A) NOT DETECTED   Staphylococcus aureus NOT DETECTED NOT DETECTED   Methicillin resistance DETECTED (A) NOT DETECTED   Streptococcus species NOT DETECTED NOT DETECTED   Streptococcus agalactiae NOT DETECTED NOT DETECTED   Streptococcus pneumoniae NOT DETECTED NOT DETECTED   Streptococcus pyogenes NOT DETECTED NOT DETECTED   Acinetobacter baumannii NOT DETECTED NOT DETECTED   Enterobacteriaceae species NOT DETECTED NOT DETECTED   Enterobacter cloacae complex NOT DETECTED NOT DETECTED   Escherichia coli NOT DETECTED NOT DETECTED   Klebsiella oxytoca NOT DETECTED NOT DETECTED   Klebsiella pneumoniae NOT DETECTED NOT DETECTED   Proteus species NOT DETECTED NOT DETECTED   Serratia marcescens NOT DETECTED NOT DETECTED   Haemophilus influenzae NOT DETECTED NOT DETECTED   Neisseria meningitidis NOT  DETECTED NOT DETECTED   Pseudomonas aeruginosa NOT DETECTED NOT DETECTED   Candida albicans NOT DETECTED NOT DETECTED   Candida glabrata NOT DETECTED NOT DETECTED   Candida krusei NOT DETECTED NOT DETECTED   Candida parapsilosis NOT DETECTED NOT DETECTED   Candida tropicalis NOT DETECTED NOT DETECTED    Lawson Radar 08/12/2017  1:35 PM

## 2017-08-12 NOTE — Progress Notes (Signed)
Mg resulted at 1.7, Dr. Oletta Darter made aware and put in orders for replacement.

## 2017-08-12 NOTE — Progress Notes (Signed)
EEG Completed; Results Pending  

## 2017-08-12 NOTE — Progress Notes (Addendum)
Patient having increased PVCs/couplets with bigeminy and trigeminy at times. No Mg checked, last K from BMet 3.9 (4.0 on chem8). Spoke with Dr. Oletta Darter, orders received. Will implement and continue to monitor.   Lab has been notified of Mg orders and will add onto the specimen that was sent down at 2337.

## 2017-08-12 NOTE — Plan of Care (Signed)
  Progressing Cardiac: Ability to achieve and maintain adequate cardiopulmonary perfusion will improve 08/12/2017 0218 - Progressing by Alonna Buckler, RN Neurologic: Promote progressive neurologic recovery 08/12/2017 0218 - Progressing by Alonna Buckler, RN Skin Integrity: Risk for impaired skin integrity will be minimized. 08/12/2017 8280 - Progressing by Alonna Buckler, RN

## 2017-08-12 NOTE — Progress Notes (Signed)
PULMONARY / CRITICAL CARE MEDICINE   Name: Malik Rojas MRN: [id number] DOB: September 27, 1931    ADMISSION DATE:  08/11/2017 CONSULTATION DATE:  08/11/17  REFERRING MD:  Jeanell Sparrow  CHIEF COMPLAINT:  Cardiac Arrest  HISTORY OF PRESENT ILLNESS:  Pt is encephelopathic; therefore, this HPI is obtained from chart review. Malik Rojas is a 84 y.o. male with PMH as outlined below. He presented to [facility] ED 1/7 after cardiac arrest at home.  Per his wife, he had SOB that morning but wanted to go and work on his car.  He then collapsed and was agonal.  By the time EMS arrived, he was pulseless.  CPR was performed for 37 minutes prior to ROSC.  There was questions whether he initially had PEA or V.fib. Per report, pt had been feeling bad for the past week with respiratory symptoms.  He apparently saw his PCP and was told that he had a respiratory infection.   Prior to arrest, no sudden worsening, no chest pain. No recent fevers/chills/sweats, N/V/D, abd pain, myalgias.  In ED, he remained unresponsive.  Vitals were stable.  He was seen by cardiology and was not felt to be a candidate for emergent cath lab.  Given that his arrest was witnessed, downtime of 37 minutes, continued unresponsive and non-purposeful state post ROSC, he will be started on targeted temperature management with goal 33 degrees.  Wife states that pt is full code; however, he would not want prolonged heroic measures.     SUBJECTIVE:   On vent, unresponsive.  Currently on hypothermia protocol along with sedation and neuromuscular blockade.  VITAL SIGNS: BP (!) 127/100 (BP Location: Left Arm)   Pulse 60   Temp (!) 91.6 F (33.1 C) (Bladder)   Resp 20   Ht 5\' 10"  (1.778 m)   Wt 103.9 kg (228 lb 15.5 oz)   SpO2 97%   BMI 32.85 kg/m   HEMODYNAMICS: CVP:  [1 mmHg-8 mmHg] 4 mmHg  VENTILATOR SETTINGS: Vent Mode: PRVC FiO2 (%):  [40 %-100 %] 40 % Set Rate:  [16 bmp-20 bmp] 20 bmp Vt Set:  [590 mL] 590 mL PEEP:  [5 cmH20] 5  cmH20 Plateau Pressure:  [17 cmH20-29 cmH20] 18 cmH20  INTAKE / OUTPUT: I/O last 3 completed shifts: In: 1535.5 [I.V.:1135.5; IV Piggyback:400] Out: 9563 [Urine:3470; Emesis/NG output:650]   PHYSICAL EXAMINATION: General: Sedated on vent currently with neuromuscular blockade on board HEENT: MM pink/moist endotracheal tube connected ventilator.  Pupils equal react light sclera edema noted PSY: Sedated and paralyzed  neuro: Sedated and paralyzed CV: Heart sounds are regular PULM: even/non-labored, lungs bilaterally managed OV:FIEP, non-tender, decreased bowel Extremities: warm/dry, 1+ edema  Skin: no rashes or lesions   LABS:  BMET Recent Labs  Lab 08/11/17 1808  08/11/17 2337 08/11/17 2349 08/12/17 0352 08/12/17 0622  NA 138   < > 139 143 139 141  K 4.2   < > 3.9 4.0 4.0 3.4*  CL 109   < > 108 106 109 105  CO2 20*  --  20*  --  20*  --   BUN 28*   < > 28* 28* 28* 27*  CREATININE 1.54*   < > 1.66* 1.60* 1.64* 1.60*  GLUCOSE 219*   < > 141* 152* 152* 194*   < > = values in this interval not displayed.    Electrolytes Recent Labs  Lab 08/11/17 1808 08/11/17 2337 08/12/17 0352  CALCIUM 7.3* 7.9* 8.2*  MG  --  1.7  --  CBC Recent Labs  Lab 08/11/17 1400  08/11/17 1808  08/11/17 2204 08/11/17 2349 08/12/17 0622  WBC 14.4*  --  20.2*  --   --   --   --   HGB 15.0   < > 14.7   < > 15.0 14.6 15.0  HCT 47.7   < > 44.7   < > 44.0 43.0 44.0  PLT 223  --  199  --   --   --   --    < > = values in this interval not displayed.    Coag's Recent Labs  Lab 08/11/17 1400 08/11/17 1808 08/11/17 2337  APTT  --  30 30  INR 1.16 1.17 1.11    Sepsis Markers Recent Labs  Lab 08/11/17 1355 08/11/17 1400  LATICACIDVEN 3.87* 3.4*    ABG Recent Labs  Lab 08/11/17 1539 08/11/17 1805 08/11/17 1824  PHART 7.199* 7.426 7.470*  PCO2ART 67.1* 33.0 29.9*  PO2ART 109.0* 76.0* 86.0    Liver Enzymes Recent Labs  Lab 08/11/17 1400  AST 112*  ALT 63   ALKPHOS 76  BILITOT 1.0  ALBUMIN 3.0*    Cardiac Enzymes Recent Labs  Lab 08/11/17 2337 08/12/17 0352 08/12/17 0801  TROPONINI 0.45* 0.39* 0.31*    Glucose Recent Labs  Lab 08/11/17 2346 08/12/17 0052 08/12/17 0157 08/12/17 0256 08/12/17 0357 08/12/17 0502  GLUCAP 148* 137* 131* 138* 150* 161*    Imaging Ct Head Wo Contrast  Result Date: 08/11/2017 CLINICAL DATA:  84 year old male post cardiac arrest with 37 minutes of CPR before return of spontaneous circulation. Initial encounter. EXAM: CT HEAD WITHOUT CONTRAST TECHNIQUE: Contiguous axial images were obtained from the base of the skull through the vertex without intravenous contrast. COMPARISON:  None. FINDINGS: Brain: No intracranial hemorrhage. Slightly poor definition anterolateral aspect of the thalamus and posterior lenticular nucleus bilaterally may indicate changes of hypoxia. Prominent chronic microvascular changes. Infarct anterior limb left internal capsule of indeterminate age. Global atrophy. No intracranial mass lesion noted on this unenhanced exam. Vascular: Vascular calcifications Skull: No skull fracture. Sinuses/Orbits: Exophthalmos. Opacification left frontal sinus, anterior ethmoid sinus air cells bilaterally and left maxillary sinus. Other: Mastoid air cells and middle ear cavities are clear IMPRESSION: No intracranial hemorrhage. Slightly poor definition anterolateral aspect of the thalamus and posterior lenticular nucleus bilaterally may indicate changes of hypoxia. Prominent chronic microvascular changes. Infarct anterior limb left internal capsule of indeterminate age. Global atrophy. Opacification left frontal sinus, anterior ethmoid sinus air cells bilaterally and left maxillary sinus. Electronically Signed   By: Genia Del M.D.   On: 08/11/2017 16:51   Dg Chest Portable 1 View  Result Date: 08/11/2017 CLINICAL DATA:  Intubation EXAM: PORTABLE CHEST 1 VIEW COMPARISON:  11/07/2016 FINDINGS: Endotracheal  tube in good position. Duo lead pacemaker unchanged in position. NG tube in the stomach. Cardiac enlargement. Diffuse bilateral airspace disease most consistent with edema. Small right effusion. Bibasilar atelectasis. IMPRESSION: Endotracheal tube in good position Diffuse bilateral airspace disease most consistent with pulmonary edema. Electronically Signed   By: Franchot Gallo M.D.   On: 08/11/2017 14:38    STUDIES:  CXR 1/7 > pulmonary edema. Echo 1/7 >  EEG 1/7 >  LE duplex 1/7 >  CT head 1/7 > no ICH  CULTURES: Blood 1/7 >  Sputum 1/7 >   ANTIBIOTICS: None.  SIGNIFICANT EVENTS: 1/7 > admit.  LINES/TUBES: ETT 1/7 >  R fem CVL 1/7 >  Art line  1/7 >   DISCUSSION: 85  y.o. male admitted 1/7 with cardiac arrest.  Likely respiratory leading to cardiac.  ASSESSMENT / PLAN:  PULMONARY A: Acute hypoxic respiratory failure - presumed acute pulmonary edema. P:   Full vent support. Wean as able. VAP prevention measures. Hold SBT until off NMB. Diuresis x1 Assess LE duplex for completeness. CXR daily  CARDIOVASCULAR A:  Cardiac Arrest - presumed initial respiratory leading to cardiac. Hx HTN, HLD, CHB, combined heart failure (Echo from April 2018 with EF 45 - 50%, G1DD), CAD. P:  Start TTM, goal 33 degrees. Continue levophed PRN, goal MAP > 80. Assess CVP's 6, echo. Trend troponin. Cardiology following. Diuresis as tolerated Continue preadmission ASA, pravastatin. Hold preadmission carvedilol, furosemide, imdur, lisinopril.  RENAL Lab Results  Component Value Date   CREATININE 1.60 (H) 08/12/2017   CREATININE 1.64 (H) 08/12/2017   CREATININE 1.60 (H) 08/11/2017   CREATININE 1.58 (H) 11/13/2016   CREATININE 1.65 (H) 07/23/2016   CREATININE 1.67 (H) 10/12/2015   Recent Labs  Lab 08/11/17 2349 08/12/17 0352 08/12/17 0622  K 4.0 4.0 3.4*    A:   AoCKD. At risk for multiple metabolic derangements during cooling. P:   Correct electrolytes as  indicated.MAG/K+ repletion 1/8 BMP q2hrs x 4 then q4hrs.  Hold preadmission lisinopril.  GASTROINTESTINAL A:   GI prophylaxis. Nutrition. P:   SUP: Pantoprazole. NPO. Start TF's.   HEMATOLOGIC Lab Results  Component Value Date   INR 1.11 08/11/2017   INR 1.17 08/11/2017   INR 1.16 08/11/2017   Recent Labs    08/11/17 2349 08/12/17 0622  HGB 14.6 15.0    A:   VTE Prophylaxis. P:  SCD's / heparin Coags q8hrs x 2. CBC in AM.  INFECTIOUS A:   No indication of infection. P:   Monitor clinically.  ENDOCRINE CBG (last 3)  Recent Labs    08/12/17 0256 08/12/17 0357 08/12/17 0502  GLUCAP 138* 150* 161*    A:   Hx DM. P:   ICU hyperglycemia protocol. Hold preadmission glargine.  NEUROLOGIC A:   At risk for anoxic encephalopathy. P:   Assess CT head. Sedation:  Propofol / Fentanyl / Cisatracurium RASS goal: -5 until off NMB.  Hold daily WUA until off NMB. Assess EEG.  Started on 08/12/2017 Neuro consult once rewarmed.  Family updated: Wife updated by Dr. Burt Knack from cardiology.  Interdisciplinary Family Meeting v Palliative Care Meeting:  Due by: 08/17/17.  CC time: 30 min.   Richardson Landry Victoriah Wilds ACNP Maryanna Shape PCCM Pager [phone] till 1 pm If no answer page [phone] 08/12/2017, 9:31 AM

## 2017-08-12 NOTE — Progress Notes (Signed)
CRITICAL VALUE ALERT  Critical Value:  Troponin 0.46/0.45  Date & Time Notied:  08/12/17 1:01 AM  Provider Notified: Dr. Oletta Darter (CCM) & Dr. Kenton Kingfisher (Cardiology)  Orders Received/Actions taken: continue to trend troponins

## 2017-08-13 ENCOUNTER — Telehealth: Payer: Self-pay | Admitting: Cardiology

## 2017-08-13 ENCOUNTER — Encounter: Payer: Medicare Other | Admitting: *Deleted

## 2017-08-13 ENCOUNTER — Inpatient Hospital Stay (HOSPITAL_COMMUNITY): Payer: Medicare Other

## 2017-08-13 LAB — POCT I-STAT, CHEM 8
BUN: 26 mg/dL — ABNORMAL HIGH (ref 6–20)
CREATININE: 2.1 mg/dL — AB (ref 0.61–1.24)
Calcium, Ion: 1.08 mmol/L — ABNORMAL LOW (ref 1.15–1.40)
Chloride: 111 mmol/L (ref 101–111)
Glucose, Bld: 119 mg/dL — ABNORMAL HIGH (ref 65–99)
HCT: 41 % (ref 39.0–52.0)
HEMOGLOBIN: 13.9 g/dL (ref 13.0–17.0)
POTASSIUM: 3.6 mmol/L (ref 3.5–5.1)
Sodium: 142 mmol/L (ref 135–145)
TCO2: 18 mmol/L — AB (ref 22–32)

## 2017-08-13 LAB — PHOSPHORUS: PHOSPHORUS: 2.4 mg/dL — AB (ref 2.5–4.6)

## 2017-08-13 LAB — BASIC METABOLIC PANEL
ANION GAP: 9 (ref 5–15)
BUN: 31 mg/dL — ABNORMAL HIGH (ref 6–20)
CO2: 19 mmol/L — AB (ref 22–32)
Calcium: 7.6 mg/dL — ABNORMAL LOW (ref 8.9–10.3)
Chloride: 111 mmol/L (ref 101–111)
Creatinine, Ser: 2.04 mg/dL — ABNORMAL HIGH (ref 0.61–1.24)
GFR calc non Af Amer: 28 mL/min — ABNORMAL LOW (ref >60)
GFR, EST AFRICAN AMERICAN: 33 mL/min — AB (ref >60)
GLUCOSE: 114 mg/dL — AB (ref 65–99)
POTASSIUM: 3.7 mmol/L (ref 3.5–5.1)
Sodium: 139 mmol/L (ref 135–145)

## 2017-08-13 LAB — CBC
HEMATOCRIT: 40.4 % (ref 39.0–52.0)
HEMOGLOBIN: 14.4 g/dL (ref 13.0–17.0)
MCH: 32.1 pg (ref 26.0–34.0)
MCHC: 35.6 g/dL (ref 30.0–36.0)
MCV: 90.2 fL (ref 78.0–100.0)
Platelets: 217 10*3/uL (ref 150–400)
RBC: 4.48 MIL/uL (ref 4.22–5.81)
RDW: 13.1 % (ref 11.5–15.5)
WBC: 12.1 10*3/uL — ABNORMAL HIGH (ref 4.0–10.5)

## 2017-08-13 LAB — GLUCOSE, CAPILLARY
GLUCOSE-CAPILLARY: 111 mg/dL — AB (ref 65–99)
GLUCOSE-CAPILLARY: 128 mg/dL — AB (ref 65–99)
Glucose-Capillary: 141 mg/dL — ABNORMAL HIGH (ref 65–99)
Glucose-Capillary: 116 mg/dL — ABNORMAL HIGH (ref 65–99)
Glucose-Capillary: 123 mg/dL — ABNORMAL HIGH (ref 65–99)

## 2017-08-13 LAB — MAGNESIUM: Magnesium: 1.8 mg/dL (ref 1.7–2.4)

## 2017-08-13 MED ORDER — SODIUM CHLORIDE 0.9 % IV BOLUS (SEPSIS)
500.0000 mL | Freq: Once | INTRAVENOUS | Status: AC
  Administered 2017-08-13: 500 mL via INTRAVENOUS

## 2017-08-13 MED ORDER — FUROSEMIDE 10 MG/ML IJ SOLN: 40.0000 mg | Freq: Two times a day (BID) | INTRAMUSCULAR | Status: DC

## 2017-08-13 MED ORDER — POTASSIUM CHLORIDE 10 MEQ/50ML IV SOLN
10.0000 meq | INTRAVENOUS | Status: AC
  Administered 2017-08-13 (×3): 10 meq via INTRAVENOUS
  Filled 2017-08-13 (×3): qty 50

## 2017-08-13 MED ORDER — VITAL HIGH PROTEIN PO LIQD
1000.0000 mL | ORAL | Status: DC
  Filled 2017-08-13: qty 1000

## 2017-08-13 MED ORDER — PRO-STAT SUGAR FREE PO LIQD
30.0000 mL | Freq: Three times a day (TID) | ORAL | Status: DC
  Administered 2017-08-13 – 2017-08-18 (×15): 30 mL via ORAL
  Filled 2017-08-13 (×15): qty 30

## 2017-08-13 MED ORDER — FUROSEMIDE 10 MG/ML IJ SOLN
20.0000 mg | Freq: Once | INTRAMUSCULAR | Status: AC
  Administered 2017-08-13: 20 mg via INTRAVENOUS

## 2017-08-13 MED ORDER — SODIUM CHLORIDE 0.9 % IV SOLN
1250.0000 mg | INTRAVENOUS | Status: DC
Start: 2017-08-13 — End: 2017-08-14
  Administered 2017-08-13: 1250 mg via INTRAVENOUS
  Filled 2017-08-13: qty 1250

## 2017-08-13 NOTE — Progress Notes (Signed)
Nutrition Follow-up  DOCUMENTATION CODES:   Obesity unspecified  INTERVENTION:  Initiate tube feeding of Vital High Protein at 50 mL/hr (1200 mL daily) with 30 mL Pro-Stat TID  Tube feeding regimen provides 1500 kcal, 150 grams of protein, and 1008 ml of H2O.   NUTRITION DIAGNOSIS:   Inadequate oral intake related to acute illness as evidenced by NPO status.  Ongoing   GOAL:   Provide needs based on ASPEN/SCCM guidelines  Progressing via TF  MONITOR:   Vent status, Labs, TF tolerance, Weight trends, Skin  ASSESSMENT:    84 yo male admitted post cardiac arrest with acute hypoxic respiratory failure, acute pulmoary edema, acute on CKD. Pt with hx of basal cell carcinoma, CAD, CHF, HTN, DM  Discussed pt with RN and NP. Received verbal order to initiate TF now that pt is rewarmed. Propofol discontinued this morning.   Patient remains intubated on ventilator support MV: 12.1 L/min Temp (24hrs), Avg:94.2 F (34.6 C), Min:90.7 F (32.6 C), Max:99.9 F (37.7 C)  Labs reviewed; CBG 101-111, Phosphorus 2.4, BUN 26 Medications reviewed; sliding scale insulin, Protonix, Norepinephrine   Diet Order:  No diet orders on file  EDUCATION NEEDS:   Not appropriate for education at this time  Skin:  Skin Assessment: Reviewed RN Assessment  Last BM:  no documented BM  Height:   Ht Readings from Last 1 Encounters:  08/11/17 5\' 10"  (1.778 m)    Weight:   Wt Readings from Last 1 Encounters:  08/13/17 226 lb 12.2 oz (102.9 kg)    Ideal Body Weight:  75.5 kg  BMI:  Body mass index is 32.54 kg/m.  Estimated Nutritional Needs:   Kcal:  0867-6195 kcals  Protein:  152-190 g  Fluid:  >/= 2 L  Parks Ranger, MS, RDN, LDN 08/13/2017 12:16 PM

## 2017-08-13 NOTE — Progress Notes (Signed)
Progress Note  Patient Name: Malik Rojas Date of Encounter: 08/13/2017  Primary Cardiologist: No primary care provider on file.   Subjective   No history obtainable.  Inpatient Medications    Scheduled Meds: . aspirin  81 mg Per Tube Daily  . carvedilol  25 mg Per Tube BID  . chlorhexidine gluconate (MEDLINE KIT)  15 mL Mouth Rinse BID  . Chlorhexidine Gluconate Cloth  6 each Topical Daily  . heparin injection (subcutaneous)  5,000 Units Subcutaneous Q8H  . insulin aspart  2-6 Units Subcutaneous Q4H  . mouth rinse  15 mL Mouth Rinse 10 times per day  . pantoprazole (PROTONIX) IV  40 mg Intravenous QHS  . pravastatin  80 mg Per Tube q1800  . sodium chloride flush  10-40 mL Intracatheter Q12H   Continuous Infusions: . fentaNYL infusion INTRAVENOUS Stopped (08/13/17 0710)  . norepinephrine (LEVOPHED) Adult infusion 12 mcg/min (08/13/17 0800)  . piperacillin-tazobactam (ZOSYN)  IV 3.375 g (08/13/17 0543)  . propofol (DIPRIVAN) infusion Stopped (08/13/17 0710)  . vancomycin     PRN Meds: albuterol, fentaNYL, hydrALAZINE, Influenza vac split quadrivalent PF, pneumococcal 23 valent vaccine, sodium chloride flush   Vital Signs    Vitals:   08/13/17 0800 08/13/17 0838 08/13/17 0900 08/13/17 1000  BP: (!) 133/55   (!) 123/47  Pulse: 79  60 60  Resp: 18  (!) 0 (!) 0  Temp: 98.6 F (37 C) 99.5 F (37.5 C) 99.9 F (37.7 C) 99.5 F (37.5 C)  TempSrc: Bladder     SpO2: (!) 85%  94% 95%  Weight:      Height:        Intake/Output Summary (Last 24 hours) at 08/13/2017 1129 Last data filed at 08/13/2017 0901 Gross per 24 hour  Intake 2493.06 ml  Output 1410 ml  Net 1083.06 ml   Filed Weights   08/11/17 1710 08/12/17 0400 08/13/17 0400  Weight: 230 lb 6.1 oz (104.5 kg) 228 lb 15.5 oz (103.9 kg) 226 lb 12.2 oz (102.9 kg)    Telemetry    Sinus rhythm- Personally Reviewed   Physical Exam  Intubated, minimally responsive GEN: No acute distress.   Neck: No  JVD Cardiac: RRR, no murmurs, rubs, or gallops.  Respiratory: Clear to auscultation bilaterally. GI: Soft, non-distended  MS: No edema; No deformity. Neuro:   The patient blinks to verbal stimulation and turns his head.  He moves his hands when touched but I am not clear if this is purposeful.  Labs    Chemistry Recent Labs  Lab 08/11/17 1400  08/11/17 2337  08/12/17 0352  08/12/17 2339 08/13/17 0305 08/13/17 0317  NA 138   < > 139   < > 139   < > 142 139 142  K 4.6   < > 3.9   < > 4.0   < > 3.5 3.7 3.6  CL 106   < > 108   < > 109   < > 109 111 111  CO2 25   < > 20*  --  20*  --   --  19*  --   GLUCOSE 259*   < > 141*   < > 152*   < > 111* 114* 119*  BUN 27*   < > 28*   < > 28*   < > 26* 31* 26*  CREATININE 1.77*   < > 1.66*   < > 1.64*   < > 2.00* 2.04* 2.10*  CALCIUM 7.8*   < >  7.9*  --  8.2*  --   --  7.6*  --   PROT 6.0*  --   --   --   --   --   --   --   --   ALBUMIN 3.0*  --   --   --   --   --   --   --   --   AST 112*  --   --   --   --   --   --   --   --   ALT 63  --   --   --   --   --   --   --   --   ALKPHOS 76  --   --   --   --   --   --   --   --   BILITOT 1.0  --   --   --   --   --   --   --   --   GFRNONAA 34*   < > 37*  --  37*  --   --  28*  --   GFRAA 39*   < > 42*  --  43*  --   --  33*  --   ANIONGAP 7   < > 11  --  10  --   --  9  --    < > = values in this interval not displayed.     Hematology Recent Labs  Lab 08/11/17 1400  08/11/17 1808  08/12/17 2339 08/13/17 0305 08/13/17 0317  WBC 14.4*  --  20.2*  --   --  12.1*  --   RBC 4.89  --  4.71  --   --  4.48  --   HGB 15.0   < > 14.7   < > 14.3 14.4 13.9  HCT 47.7   < > 44.7   < > 42.0 40.4 41.0  MCV 97.5  --  94.9  --   --  90.2  --   MCH 30.7  --  31.2  --   --  32.1  --   MCHC 31.4  --  32.9  --   --  35.6  --   RDW 12.9  --  12.9  --   --  13.1  --   PLT 223  --  199  --   --  217  --    < > = values in this interval not displayed.    Cardiac Enzymes Recent Labs  Lab  08/11/17 1808 08/11/17 2337 08/12/17 0352 08/12/17 0801  TROPONINI 0.46* 0.45* 0.39* 0.31*    Recent Labs  Lab 08/11/17 1352  TROPIPOC 0.10*     BNPNo results for input(s): BNP, PROBNP in the last 168 hours.   DDimer No results for input(s): DDIMER in the last 168 hours.   Radiology    Ct Head Wo Contrast  Result Date: 08/11/2017 CLINICAL DATA:  84 year old male post cardiac arrest with 37 minutes of CPR before return of spontaneous circulation. Initial encounter. EXAM: CT HEAD WITHOUT CONTRAST TECHNIQUE: Contiguous axial images were obtained from the base of the skull through the vertex without intravenous contrast. COMPARISON:  None. FINDINGS: Brain: No intracranial hemorrhage. Slightly poor definition anterolateral aspect of the thalamus and posterior lenticular nucleus bilaterally may indicate changes of hypoxia. Prominent chronic microvascular changes. Infarct anterior limb left internal capsule of indeterminate age. Global atrophy. No intracranial mass  lesion noted on this unenhanced exam. Vascular: Vascular calcifications Skull: No skull fracture. Sinuses/Orbits: Exophthalmos. Opacification left frontal sinus, anterior ethmoid sinus air cells bilaterally and left maxillary sinus. Other: Mastoid air cells and middle ear cavities are clear IMPRESSION: No intracranial hemorrhage. Slightly poor definition anterolateral aspect of the thalamus and posterior lenticular nucleus bilaterally may indicate changes of hypoxia. Prominent chronic microvascular changes. Infarct anterior limb left internal capsule of indeterminate age. Global atrophy. Opacification left frontal sinus, anterior ethmoid sinus air cells bilaterally and left maxillary sinus. Electronically Signed   By: Genia Del M.D.   On: 08/11/2017 16:51   Dg Chest Port 1 View  Result Date: 08/13/2017 CLINICAL DATA:  Intubated patient.  Respiratory failure. EXAM: PORTABLE CHEST 1 VIEW COMPARISON:  08/11/2017 FINDINGS: Lung base  opacity has increased when compared to the prior exam, now obscuring both hemidiaphragms. There is persistent interstitial thickening with hazy central airspace opacities consistent with pulmonary edema. No pneumothorax. Endotracheal tube and nasal/orogastric tube are stable in well positioned. IMPRESSION: 1. There is an increase in opacity at the lung bases compared to the previous day's study, which is consistent with enlargement of bilateral pleural effusions. 2. Persistent pulmonary edema, stable from the prior study. 3. Support apparatus is stable and well positioned. Electronically Signed   By: Lajean Manes M.D.   On: 08/13/2017 09:10   Dg Chest Portable 1 View  Result Date: 08/11/2017 CLINICAL DATA:  Intubation EXAM: PORTABLE CHEST 1 VIEW COMPARISON:  11/07/2016 FINDINGS: Endotracheal tube in good position. Duo lead pacemaker unchanged in position. NG tube in the stomach. Cardiac enlargement. Diffuse bilateral airspace disease most consistent with edema. Small right effusion. Bibasilar atelectasis. IMPRESSION: Endotracheal tube in good position Diffuse bilateral airspace disease most consistent with pulmonary edema. Electronically Signed   By: Franchot Gallo M.D.   On: 08/11/2017 14:38    Cardiac Studies   Echo: Study Conclusions  - Left ventricle: Diffuse hypokinesis mid and basal akinesis The   cavity size was moderately dilated. Wall thickness was increased   in a pattern of severe LVH. Systolic function was severely   reduced. The estimated ejection fraction was in the range of 25%   to 30%. Diffuse hypokinesis. - Aortic valve: There was mild regurgitation. - Mitral valve: Severely calcified annulus. Moderately thickened   leaflets . There was mild regurgitation. - Left atrium: The atrium was mildly dilated.   Patient Profile     84 y.o. male with nonischemic cardiomyopathy in poor health who suffered OOH cardiopulmonary arrest  Assessment & Plan    Cardiac arrest: Patient  with known cardiomyopathy and chronic heart failure.  He has rewarmed but continues to have significant evidence of anoxic encephalopathy.  We will continue to follow his progress over the next 24-48 hours.  His family clearly expressed at the time of his admission that he would not want prolonged support.  He does seem to have some neurologic activity but has not yet undergone formal neurologic evaluation.  His echocardiogram demonstrates severe diffuse LV dysfunction.  Low level flat troponin trend does not suggest an acute coronary event.  We will continue to follow with you.  For questions or updates, please contact [employer] Please consult [email or web address] for contact info under Cardiology/STEMI.      Signed, Sherren Mocha, MD  08/13/2017, 11:29 AM

## 2017-08-13 NOTE — Progress Notes (Signed)
CDS referral process begun 08/13/17 4:35 AM. Spoke with Alesia Morin, referral number [phone]. Info also in flowsheets.

## 2017-08-13 NOTE — Care Management Note (Signed)
Case Management Note  Patient Details  Name: Malik Rojas MRN: [id number] Date of Birth: 1932/03/08  Subjective/Objective:  Pt admitted s/p witnessed cardiac arrest                 Action/Plan:   PTA from home.  Pt remains on vent and on hypothermia protocol - guarded prognosis   Expected Discharge Date:                  Expected Discharge Plan:  (from home with wife)  In-House Referral:     Discharge planning Services  CM Consult  Post Acute Care Choice:    Choice offered to:     DME Arranged:    DME Agency:     HH Arranged:    HH Agency:     Status of Service:     If discussed at [employer], dates discussed:    Additional Comments:  Maryclare Labrador, RN 08/13/2017, 4:09 PM

## 2017-08-13 NOTE — Progress Notes (Signed)
PULMONARY / CRITICAL CARE MEDICINE   Name: Malik Rojas MRN: [id number] DOB: April 15, 1932    ADMISSION DATE:  08/11/2017 CONSULTATION DATE:  08/11/17  REFERRING MD:  Jeanell Sparrow  CHIEF COMPLAINT:  Cardiac Arrest  HISTORY OF PRESENT ILLNESS:  Pt is encephelopathic; therefore, this HPI is obtained from chart review. Malik Rojas is a 84 y.o. male with PMH as outlined below. He presented to [facility] ED 1/7 after cardiac arrest at home.  Per his wife, he had SOB that morning but wanted to go and work on his car.  He then collapsed and was agonal.  By the time EMS arrived, he was pulseless.  CPR was performed for 37 minutes prior to ROSC.  There was questions whether he initially had PEA or V.fib. Per report, pt had been feeling bad for the past week with respiratory symptoms.  He apparently saw his PCP and was told that he had a respiratory infection.   Prior to arrest, no sudden worsening, no chest pain. No recent fevers/chills/sweats, N/V/D, abd pain, myalgias.  In ED, he remained unresponsive.  Vitals were stable.  He was seen by cardiology and was not felt to be a candidate for emergent cath lab.  Given that his arrest was witnessed, downtime of 37 minutes, continued unresponsive and non-purposeful state post ROSC, he will be started on targeted temperature management with goal 33 degrees.  Wife states that pt is full code; however, he would not want prolonged heroic measures.     SUBJECTIVE:   On vent, unresponsive.  Currently on hypothermia protocol along with sedation and neuromuscular blockade.  VITAL SIGNS: BP (!) 165/65   Pulse 72   Temp 99.5 F (37.5 C)   Resp 20   Ht 5\' 10"  (1.778 m)   Wt 102.9 kg (226 lb 12.2 oz)   SpO2 93%   BMI 32.54 kg/m   HEMODYNAMICS: CVP:  [2 mmHg-7 mmHg] 7 mmHg  VENTILATOR SETTINGS: Vent Mode: PRVC FiO2 (%):  [30 %] 30 % Set Rate:  [20 bmp] 20 bmp Vt Set:  [590 mL] 590 mL PEEP:  [5 cmH20] 5 cmH20 Plateau Pressure:  [16 cmH20-20 cmH20] 20  cmH20  INTAKE / OUTPUT: I/O last 3 completed shifts: In: 4119.4 [I.V.:2969.4; IV Piggyback:1150] Out: 4401 [Urine:3205; Emesis/NG output:1300]   PHYSICAL EXAMINATION: General: Obese male currently off sedation. HEENT: Endotracheal tube to vent, gastric tube in place.  Pleural edema noted Peoples equal reactive to light PSY: Not available Neuro: Does not follow commands withdrawal CV: Heart sounds are regular PULM: Coarse rhonchi bilaterally UU:VOZD, non-tender, bsx4 active  Extremities: warm/dry, 2+ edema  Skin: no rashes or lesions    LABS:  BMET Recent Labs  Lab 08/11/17 2337  08/12/17 0352  08/12/17 2339 08/13/17 0305 08/13/17 0317  NA 139   < > 139   < > 142 139 142  K 3.9   < > 4.0   < > 3.5 3.7 3.6  CL 108   < > 109   < > 109 111 111  CO2 20*  --  20*  --   --  19*  --   BUN 28*   < > 28*   < > 26* 31* 26*  CREATININE 1.66*   < > 1.64*   < > 2.00* 2.04* 2.10*  GLUCOSE 141*   < > 152*   < > 111* 114* 119*   < > = values in this interval not displayed.    Electrolytes Recent Labs  Lab 08/11/17 2337 08/12/17 0352 08/13/17 0305  CALCIUM 7.9* 8.2* 7.6*  MG 1.7  --  1.8  PHOS  --   --  2.4*    CBC Recent Labs  Lab 08/11/17 1400  08/11/17 1808  08/12/17 2339 08/13/17 0305 08/13/17 0317  WBC 14.4*  --  20.2*  --   --  12.1*  --   HGB 15.0   < > 14.7   < > 14.3 14.4 13.9  HCT 47.7   < > 44.7   < > 42.0 40.4 41.0  PLT 223  --  199  --   --  217  --    < > = values in this interval not displayed.    Coag's Recent Labs  Lab 08/11/17 1400 08/11/17 1808 08/11/17 2337  APTT  --  30 30  INR 1.16 1.17 1.11    Sepsis Markers Recent Labs  Lab 08/11/17 1355 08/11/17 1400  LATICACIDVEN 3.87* 3.4*    ABG Recent Labs  Lab 08/11/17 1539 08/11/17 1805 08/11/17 1824  PHART 7.199* 7.426 7.470*  PCO2ART 67.1* 33.0 29.9*  PO2ART 109.0* 76.0* 86.0    Liver Enzymes Recent Labs  Lab 08/11/17 1400  AST 112*  ALT 63  ALKPHOS 76  BILITOT 1.0   ALBUMIN 3.0*    Cardiac Enzymes Recent Labs  Lab 08/11/17 2337 08/12/17 0352 08/12/17 0801  TROPONINI 0.45* 0.39* 0.31*    Glucose Recent Labs  Lab 08/12/17 0256 08/12/17 0357 08/12/17 0502 08/12/17 1944 08/12/17 2337 08/13/17 0315  GLUCAP 138* 150* 161* 101* 112* 111*    Imaging No results found.  STUDIES:  CXR 1/7 > pulmonary edema. Echo 1/7 >  EEG 1/7 >  LE duplex 1/7 >  CT head 1/7 > no ICH  CULTURES: Blood 1/7 > 102 blood cultures positive>> Sputum 1/7 >   ANTIBIOTICS: 08/12/2017 vancomycin>> 08/12/1998 and 19,000>>  SIGNIFICANT EVENTS: 1/7 > admit.  LINES/TUBES: ETT 1/7 >  R fem CVL 1/7 >  Art line  1/7 >   DISCUSSION: 84 y.o. male admitted 1/7 with cardiac arrest.  Likely respiratory leading to cardiac.  ASSESSMENT / PLAN:  PULMONARY A: Acute hypoxic respiratory failure - presumed acute pulmonary edema.  Questionable if there is infectious component.  P:   Full vent support. Wean as able. VAP prevention measures. Hold SBT until off NMB. Diuresis as tolerated Assess LE duplex for completeness. CXR daily  CARDIOVASCULAR A:  Cardiac Arrest - presumed initial respiratory leading to cardiac. Hx HTN, HLD, CHB, combined heart failure (Echo from April 2018 with EF 45 - 50%, G1DD), CAD. P:  Start TTM, goal 33 degrees.  He was has been completed and he has warmed up on 08/13/2017 Continue levophed PRN, goal MAP > 80. Assess CVP's 6, echo. Trend troponin. Cardiology following. Diuresis as tolerated Continue preadmission ASA, pravastatin. Hold preadmission carvedilol, furosemide, imdur, lisinopril.  RENAL Lab Results  Component Value Date   CREATININE 2.10 (H) 08/13/2017   CREATININE 2.04 (H) 08/13/2017   CREATININE 2.00 (H) 08/12/2017   CREATININE 1.58 (H) 11/13/2016   CREATININE 1.65 (H) 07/23/2016   CREATININE 1.67 (H) 10/12/2015   Recent Labs  Lab 08/12/17 2339 08/13/17 0305 08/13/17 0317  K 3.5 3.7 3.6    A:   AoCKD. At  risk for multiple metabolic derangements during cooling. P:   Correct electrolytes as indicated.MAG/K+ repletion 1/8 BMP q2hrs x 4 then q4hrs.  Hold preadmission lisinopril.  GASTROINTESTINAL A:   GI prophylaxis. Nutrition. P:  SUP: Pantoprazole. NPO. Start TF's.   HEMATOLOGIC Lab Results  Component Value Date   INR 1.11 08/11/2017   INR 1.17 08/11/2017   INR 1.16 08/11/2017   Recent Labs    08/13/17 0305 08/13/17 0317  HGB 14.4 13.9    A:   VTE Prophylaxis. P:  SCD's / heparin. CBC as needed  INFECTIOUS A:   08/12/2017 noted to have 1 of 2 blood cultures positive for MRSA P:   To the presence of AICD placed on vancomycin and Zosyn on 08/12/2017  ENDOCRINE CBG (last 3)  Recent Labs    08/12/17 1944 08/12/17 2337 08/13/17 0315  GLUCAP 101* 112* 111*    A:   Hx DM. P:   ICU hyperglycemia protocol. Hold preadmission glargine.  NEUROLOGIC A:   At risk for anoxic encephalopathy. P:   CT head.  With no intracranial hemorrhage old infarct global atrophy Sedation: 08/13/2017 sedation currently off RASS goal: 0  daily WUA  Assess EEG.  Started on 08/12/2017 Neuro consult once rewarmed.  08/13/2017 neuro has not been called in consult as of yet.  Family updated: 08/13/2017 no family at bedside  Interdisciplinary Family Meeting v Palliative Care Meeting:  Due by: 08/17/17.  CC time: 30 min.   Richardson Landry Aida Lemaire ACNP Maryanna Shape PCCM Pager [phone] till 1 pm If no answer page [phone] 08/13/2017, 9:12 AM

## 2017-08-13 NOTE — Telephone Encounter (Signed)
LMOVM reminding pt to send remote transmission.   

## 2017-08-13 NOTE — Progress Notes (Signed)
eLink Physician-Brief Progress Note Patient Name: Malik Rojas DOB: [birth year] MRN: [id number]   Date of Service  08/13/2017  HPI/Events of Note  Multiple issues: 1. Oliguria - CVP = 6 and last LVEF =  25% to 30% and 2. K+ = 3.5 - patient is in the rewarming phase. Will hold off on K+ replacement at this time.   eICU Interventions  Will order: 1. Bolus with 0.9 NaCl 500 mL IV over 30 minutes now.      Intervention Category Intermediate Interventions: Oliguria - evaluation and management  Sommer,Steven Eugene 08/13/2017, 12:13 AM

## 2017-08-13 NOTE — Progress Notes (Signed)
Patient having increased ectopy: frequent multifocal PVCs (K 3.5); also with decreased urine output 10-15cc/hr for past 3 hours. Call to [facility] Dr Oletta Darter, orders received.

## 2017-08-13 NOTE — Progress Notes (Addendum)
[facility] Progress Note Patient Name: Malik Rojas DOB: 16-Sep-1931 MRN: [id number]   Date of Service  08/13/2017  HPI/Events of Note  K+ = 3.6 and Creatinine = 2.10.  eICU Interventions  Will cautiously replace K+.     Intervention Category Major Interventions: Electrolyte abnormality - evaluation and management  Sommer,Steven Eugene 08/13/2017, 6:23 AM

## 2017-08-14 ENCOUNTER — Encounter: Payer: Self-pay | Admitting: Cardiology

## 2017-08-14 ENCOUNTER — Inpatient Hospital Stay (HOSPITAL_COMMUNITY): Payer: Medicare Other

## 2017-08-14 DIAGNOSIS — I5043 Acute on chronic combined systolic (congestive) and diastolic (congestive) heart failure: Secondary | ICD-10-CM

## 2017-08-14 DIAGNOSIS — I5042 Chronic combined systolic (congestive) and diastolic (congestive) heart failure: Secondary | ICD-10-CM

## 2017-08-14 DIAGNOSIS — E0821 Diabetes mellitus due to underlying condition with diabetic nephropathy: Secondary | ICD-10-CM

## 2017-08-14 LAB — CBC
HCT: 40 % (ref 39.0–52.0)
HEMOGLOBIN: 12.9 g/dL — AB (ref 13.0–17.0)
MCH: 30.1 pg (ref 26.0–34.0)
MCHC: 32.3 g/dL (ref 30.0–36.0)
MCV: 93.5 fL (ref 78.0–100.0)
Platelets: 196 10*3/uL (ref 150–400)
RBC: 4.28 MIL/uL (ref 4.22–5.81)
RDW: 14 % (ref 11.5–15.5)
WBC: 12.1 10*3/uL — AB (ref 4.0–10.5)

## 2017-08-14 LAB — GLUCOSE, CAPILLARY
GLUCOSE-CAPILLARY: 148 mg/dL — AB (ref 65–99)
GLUCOSE-CAPILLARY: 155 mg/dL — AB (ref 65–99)
GLUCOSE-CAPILLARY: 161 mg/dL — AB (ref 65–99)
Glucose-Capillary: 160 mg/dL — ABNORMAL HIGH (ref 65–99)
Glucose-Capillary: 157 mg/dL — ABNORMAL HIGH (ref 65–99)
Glucose-Capillary: 138 mg/dL — ABNORMAL HIGH (ref 65–99)

## 2017-08-14 LAB — CULTURE, BLOOD (ROUTINE X 2): Special Requests: ADEQUATE

## 2017-08-14 LAB — BASIC METABOLIC PANEL
ANION GAP: 11 (ref 5–15)
BUN: 40 mg/dL — ABNORMAL HIGH (ref 6–20)
CO2: 19 mmol/L — AB (ref 22–32)
Calcium: 7.8 mg/dL — ABNORMAL LOW (ref 8.9–10.3)
Chloride: 108 mmol/L (ref 101–111)
Creatinine, Ser: 3.55 mg/dL — ABNORMAL HIGH (ref 0.61–1.24)
GFR, EST AFRICAN AMERICAN: 17 mL/min — AB (ref >60)
GFR, EST NON AFRICAN AMERICAN: 15 mL/min — AB (ref >60)
Glucose, Bld: 166 mg/dL — ABNORMAL HIGH (ref 65–99)
Potassium: 4.8 mmol/L (ref 3.5–5.1)
Sodium: 138 mmol/L (ref 135–145)

## 2017-08-14 LAB — MAGNESIUM: Magnesium: 1.8 mg/dL (ref 1.7–2.4)

## 2017-08-14 LAB — PHOSPHORUS: PHOSPHORUS: 5.3 mg/dL — AB (ref 2.5–4.6)

## 2017-08-14 MED ORDER — VANCOMYCIN HCL 10 G IV SOLR
1500.0000 mg | INTRAVENOUS | Status: DC
  Administered 2017-08-15: 1500 mg via INTRAVENOUS
  Filled 2017-08-14: qty 1500

## 2017-08-14 MED ORDER — VITAL HIGH PROTEIN PO LIQD
1000.0000 mL | ORAL | Status: DC
  Administered 2017-08-14 – 2017-08-18 (×3): 1000 mL
  Filled 2017-08-14 (×2): qty 1000

## 2017-08-14 MED ORDER — PIPERACILLIN-TAZOBACTAM IN DEX 2-0.25 GM/50ML IV SOLN
2.2500 g | Freq: Three times a day (TID) | INTRAVENOUS | Status: DC
  Administered 2017-08-14 – 2017-08-17 (×9): 2.25 g via INTRAVENOUS
  Filled 2017-08-14 (×10): qty 50

## 2017-08-14 MED ORDER — PANTOPRAZOLE SODIUM 40 MG PO PACK
40.0000 mg | PACK | Freq: Every day | ORAL | Status: DC
  Administered 2017-08-14 – 2017-08-17 (×4): 40 mg
  Filled 2017-08-14 (×4): qty 20

## 2017-08-14 MED ORDER — FENTANYL CITRATE (PF) 100 MCG/2ML IJ SOLN
25.0000 ug | INTRAMUSCULAR | Status: DC | PRN
  Administered 2017-08-14 – 2017-08-15 (×3): 100 ug via INTRAVENOUS
  Filled 2017-08-14 (×3): qty 2

## 2017-08-14 NOTE — Progress Notes (Signed)
Pharmacy Antibiotic Note  Malik Rojas is a 84 y.o. male s/p cardiac arrest now with 1/2 blood cultures positive for CoNS. Has ICD in place. Also possible aspiration pneumonia. Pharmacy dosing vancomycin and zosyn dosing. Patient's renal fx has worsened today with SCr up to 3.55. WBC 12.1.   Vancomycin trough goal 15-20  Plan: 1) Decrease vancomycin to 1500 mg IV Q 48 hours  2) Decrease Zosyn to 2.25 gm IV Q 8 hours  3) Follow renal function, cultures, LOT, level as needed  Height: 5\' 10"  (177.8 cm) Weight: 226 lb 12.2 oz (102.9 kg) IBW/kg (Calculated) : 73  Temp (24hrs), Avg:98.6 F (37 C), Min:98.2 F (36.8 C), Max:99.9 F (37.7 C)  Recent Labs  Lab 08/11/17 1355 08/11/17 1400  08/11/17 1808  08/12/17 1947 08/12/17 2339 08/13/17 0305 08/13/17 0317 08/14/17 0412  WBC  --  14.4*  --  20.2*  --   --   --  12.1*  --  12.1*  CREATININE  --  1.77*   < > 1.54*   < > 1.80* 2.00* 2.04* 2.10* 3.55*  LATICACIDVEN 3.87* 3.4*  --   --   --   --   --   --   --   --    < > = values in this interval not displayed.    Estimated Creatinine Clearance: 19 mL/min (A) (by C-G formula based on SCr of 3.55 mg/dL (H)).    No Known Allergies  Antimicrobials this admission: 1/8 Vancomycin >> 1/8 Zosyn >>  Microbiology results: 1/7 blood >> 1/2 CoNS 1/7 MRSA PCR negative  Thank you for allowing pharmacy to be a part of this patient's care.  Albertina Parr, PharmD., BCPS Clinical Pharmacist Pager [phone]

## 2017-08-14 NOTE — Progress Notes (Signed)
PULMONARY / CRITICAL CARE MEDICINE   Name: Malik Rojas MRN: [id number] DOB: Apr 17, 1932    ADMISSION DATE:  08/11/2017 CONSULTATION DATE:  08/11/17  REFERRING MD:  Jeanell Sparrow  CHIEF COMPLAINT:  Cardiac Arrest  HISTORY OF PRESENT ILLNESS:  84 y.o. male with PMH as outlined below. He presented to [facility] ED 1/7 after cardiac arrest at home.  Per his wife, he had SOB that morning but wanted to go and work on his car.  He then collapsed and was agonal.  By the time EMS arrived, he was pulseless.  CPR was performed for 37 minutes prior to ROSC.  There was questions whether he initially had PEA or V.fib. Per report, pt had been feeling bad for the past week with respiratory symptoms.  He apparently saw his PCP and was told that he had a respiratory infection.   Prior to arrest, no sudden worsening, no chest pain. No recent fevers/chills/sweats, N/V/D, abd pain, myalgias.  In ED, he remained unresponsive.  Vitals were stable.  He was seen by cardiology and was not felt to be a candidate for emergent cath lab.  Given that his arrest was witnessed, downtime of 37 minutes, continued unresponsive and non-purposeful state post ROSC, he will be started on targeted temperature management with goal 33 degrees.  Wife states that pt is full code; however, he would not want prolonged heroic measures.   SUBJECTIVE:    Awake, tracks, follows some commands intermittently.  Failed wean r/t poor effort.  Increased OG outpt, decreased UOP.  Off pressors   VITAL SIGNS: BP (!) 124/51   Pulse 78   Temp 98.6 F (37 C)   Resp 20   Ht 5\' 10"  (1.778 m)   Wt 102.9 kg (226 lb 12.2 oz)   SpO2 97%   BMI 32.54 kg/m   HEMODYNAMICS: CVP:  [5 mmHg-12 mmHg] 12 mmHg  VENTILATOR SETTINGS: Vent Mode: PRVC FiO2 (%):  [50 %] 50 % Set Rate:  [20 bmp] 20 bmp Vt Set:  [590 mL] 590 mL PEEP:  [5 cmH20] 5 cmH20 Plateau Pressure:  [17 cmH20-20 cmH20] 19 cmH20  INTAKE / OUTPUT: I/O last 3 completed shifts: In: 1983.1  [I.V.:1133.1; NG/GT:200; IV VFIEPPIRJ:188] Out: 2010 [Urine:635; Emesis/NG output:1375]   PHYSICAL EXAMINATION: General: Obese male NAD on vent  HEENT: ETT, mm moist, PERRL,  Neuro: awake, tracks, follows some commands intermittently, MAE  CV: s1s2 rrr PULM: resps even non labored on vent, Vt <200 on PS 10/5, Coarse rhonchi bilaterally GI: Mildly distended, non-tender, bsx4 active  Extremities: warm/dry, 2+ edema  Skin: no rashes or lesions    LABS:  BMET Recent Labs  Lab 08/12/17 0352  08/13/17 0305 08/13/17 0317 08/14/17 0412  NA 139   < > 139 142 138  K 4.0   < > 3.7 3.6 4.8  CL 109   < > 111 111 108  CO2 20*  --  19*  --  19*  BUN 28*   < > 31* 26* 40*  CREATININE 1.64*   < > 2.04* 2.10* 3.55*  GLUCOSE 152*   < > 114* 119* 166*   < > = values in this interval not displayed.    Electrolytes Recent Labs  Lab 08/11/17 2337 08/12/17 0352 08/13/17 0305 08/14/17 0412  CALCIUM 7.9* 8.2* 7.6* 7.8*  MG 1.7  --  1.8 1.8  PHOS  --   --  2.4* 5.3*    CBC Recent Labs  Lab 08/11/17 1808  08/13/17 0305 08/13/17  9678 08/14/17 0412  WBC 20.2*  --  12.1*  --  12.1*  HGB 14.7   < > 14.4 13.9 12.9*  HCT 44.7   < > 40.4 41.0 40.0  PLT 199  --  217  --  196   < > = values in this interval not displayed.    Coag's Recent Labs  Lab 08/11/17 1400 08/11/17 1808 08/11/17 2337  APTT  --  30 30  INR 1.16 1.17 1.11    Sepsis Markers Recent Labs  Lab 08/11/17 1355 08/11/17 1400  LATICACIDVEN 3.87* 3.4*    ABG Recent Labs  Lab 08/11/17 1539 08/11/17 1805 08/11/17 1824  PHART 7.199* 7.426 7.470*  PCO2ART 67.1* 33.0 29.9*  PO2ART 109.0* 76.0* 86.0    Liver Enzymes Recent Labs  Lab 08/11/17 1400  AST 112*  ALT 63  ALKPHOS 76  BILITOT 1.0  ALBUMIN 3.0*    Cardiac Enzymes Recent Labs  Lab 08/11/17 2337 08/12/17 0352 08/12/17 0801  TROPONINI 0.45* 0.39* 0.31*    Glucose Recent Labs  Lab 08/13/17 0815 08/13/17 1154 08/13/17 1640  08/13/17 1934 08/14/17 0037 08/14/17 0823  GLUCAP 141* 128* 116* 123* 148* 155*    Imaging Dg Chest Port 1 View  Result Date: 08/14/2017 CLINICAL DATA:  ETT present Dimas Millin failure EXAM: PORTABLE CHEST 1 VIEW COMPARISON:  08/13/2017 FINDINGS: The patient has a left-sided transvenous pacemaker with leads to the right atrium and right ventricle (off the image). A nasogastric tube is in place, tip off the film beyond the gastroesophageal junction. An endotracheal tube is in place, tip approximately 2 centimeters above the carina. There has been some improvement in aeration since the prior study. Significant bibasilar opacities persist which obscure the hemidiaphragms. The heart size is accentuated by technique. IMPRESSION: 1. Slight improvement in pulmonary edema. 2. Persistent bibasilar opacities. Electronically Signed   By: Nolon Nations M.D.   On: 08/14/2017 08:25    STUDIES:  Echo 1/7 > EF 25-30%, diffuse hypokinesis, severe LVH, mid MR EEG 1/7 > This EEG is markedly abnormal due to burst-suppression pattern.  Clinical Correlation of the above findings indicates severe diffuse cerebral dysfunction that is non-specific in etiology and can be seen in the setting of anoxic/ischemic injury, toxic/metabolic encephalopathies, or medication effect from Propofol and Fentanyl. There were no electrographic seizures in this study. Clinical correlation is advised. LE duplex 1/7 >  CT head 1/7 > no ICH  CULTURES: Blood 1/7 > 1/2 blood cultures positive>>coag neg staph Sputum 1/7 >   ANTIBIOTICS: 08/12/2017 vancomycin>> 1/8 zosyn>>>  SIGNIFICANT EVENTS: 1/7 > admit.  LINES/TUBES: ETT 1/7 >  R fem CVL 1/7 > 1/10 Art line  1/7 >   DISCUSSION: 84 y.o. male admitted 1/7 with cardiac arrest.  Likely respiratory leading to cardiac.  ASSESSMENT / PLAN:  PULMONARY A: Acute hypoxic respiratory failure - s/p cardiac arrest likely related at least in part to pulmonary edema.  P:   Vent  support - 8cc/kg  F/u CXR  F/u ABG Daily SBT -failed at this a.m. related to poor patient effort VAP prevention measures. Diuresis as tolerated -hold further diuresis today related to bump in creatinine after Lasix yesterday BLE Dopplers pending CXR daily  CARDIOVASCULAR A:  Cardiac Arrest - presumed initial respiratory leading to cardiac. Hx HTN, HLD, CHB combined heart failure (Echo from April 2018 with EF 45 - 50%, G1DD), CAD. P:  S/p 33 degree TTM  Echo as above, severe LV dysfunction, EF 25-30% Trend troponin. Cardiology following.  Hold further diuresis today with jump in Scr and decreased UOP Continue preadmission ASA, pravastatin, coreg. Continue to hold preadmission furosemide, imdur, lisinopril with labile BP. D/c femoral CVL - has adequate peripheral access, no pressors or sedation gtts   RENAL AKI on CKD  P:   Correct electrolytes as needed Follow-up chemistry Hold preadmission lisinopril. Hold further lasix today   GASTROINTESTINAL A:   GI prophylaxis. Nutrition. Increased OG outpt P:   SUP: Pantoprazole. NPO  Check KUB  If no ileus start trickle feeds 1/10  HEMATOLOGIC A:   VTE Prophylaxis. P:  SCD's / heparin. CBC as needed  INFECTIOUS A:   08/12/2017 noted to have 1 of 2 blood cultures positive for MRSA - ?contaminant  P:   Has indwelling AICD  Continue vanc/zosyn  Repeat BC 1/10  ENDOCRINE A:   Hx DM. P:   ICU hyperglycemia protocol. Hold preadmission glargine.  NEUROLOGIC A:   At risk for anoxic encephalopathy. AMS  P:   RASS goal = 0  PRN fentanyl  Hold off on neuro input for now with improving mental status  Avoid sedating medications as able    Family updated: 08/14/2017 no family at bedside  Interdisciplinary Family Meeting v Palliative Care Meeting:  Due by: 08/17/17.  CC time: 30 min.   Nickolas Madrid, NP 08/14/2017  9:45 AM Pager: [phone] or [phone]  Attending Note:  84 year old male who  suffered a cardiac arrest on 1/7 likely respiratory related.  Patient was admitted to the ICU and underwent the hypothermia protocol that is now complete.  On exam this AM patient is awake and interactive and following some command but failing weaning due to lack of efforts.  Discussed with PCCM-NP.  Will continue weaning efforts.  Minimize sedation.  Hold further diureses today.  D/C central line.  Start TF.  Continue ASA, coreg and pravastatin.  PCCM will re-evaluate in AM for potential extubation.   The patient is critically ill with multiple organ systems failure and requires high complexity decision making for assessment and support, frequent evaluation and titration of therapies, application of advanced monitoring technologies and extensive interpretation of multiple databases.   Critical Care Time devoted to patient care services described in this note is  35  Minutes. This time reflects time of care of this signee Dr Jennet Maduro. This critical care time does not reflect procedure time, or teaching time or supervisory time of PA/NP/Med student/Med Resident etc but could involve care discussion time.  Rush Farmer, M.D. [facility] Pulmonary/Critical Care Medicine. Pager: [phone]. After hours pager: [phone].

## 2017-08-14 NOTE — Progress Notes (Signed)
Progress Note  Patient Name: Malik Rojas Date of Encounter: 08/14/2017  Primary Cardiologist: No primary care provider on file.   Subjective   Intubated, awake but unable to provide history  Inpatient Medications    Scheduled Meds: . aspirin  81 mg Per Tube Daily  . carvedilol  25 mg Per Tube BID  . chlorhexidine gluconate (MEDLINE KIT)  15 mL Mouth Rinse BID  . Chlorhexidine Gluconate Cloth  6 each Topical Daily  . feeding supplement (PRO-STAT SUGAR FREE 64)  30 mL Oral TID  . heparin injection (subcutaneous)  5,000 Units Subcutaneous Q8H  . insulin aspart  2-6 Units Subcutaneous Q4H  . mouth rinse  15 mL Mouth Rinse 10 times per day  . pantoprazole sodium  40 mg Per Tube QHS  . pravastatin  80 mg Per Tube q1800  . sodium chloride flush  10-40 mL Intracatheter Q12H   Continuous Infusions: . feeding supplement (VITAL HIGH PROTEIN)    . fentaNYL infusion INTRAVENOUS Stopped (08/13/17 0710)  . norepinephrine (LEVOPHED) Adult infusion Stopped (08/14/17 0277)  . piperacillin-tazobactam (ZOSYN)  IV    . [START ON 08/15/2017] vancomycin     PRN Meds: albuterol, fentaNYL, hydrALAZINE, Influenza vac split quadrivalent PF, pneumococcal 23 valent vaccine, sodium chloride flush   Vital Signs    Vitals:   08/14/17 0645 08/14/17 0700 08/14/17 0735 08/14/17 0825  BP:   (!) 130/53 (!) 124/51  Pulse: 64 70 75 78  Resp: 20 (!) _0 Temp: 98.6 F (37 C) 98.6 F (37 C) 98.6 F (37 C) 98.6 F (37 C)  TempSrc:      SpO2: 94% 97% 94% 97%  Weight:      Height:        Intake/Output Summary (Last 24 hours) at 08/14/2017 0950 Last data filed at 08/14/2017 0602 Gross per 24 hour  Intake 702.5 ml  Output 1540 ml  Net -837.5 ml   Filed Weights   08/11/17 1710 08/12/17 0400 08/13/17 0400  Weight: 230 lb 6.1 oz (104.5 kg) 228 lb 15.5 oz (103.9 kg) 226 lb 12.2 oz (102.9 kg)    Telemetry    Sinus rhythm - Personally Reviewed   Physical Exam  Awake, somewhat agitated,  follows some commands (raises arms and moves feet to command GEN: No acute distress.   Neck: No JVD Cardiac: RRR, no murmurs, rubs, or gallops.  Respiratory: Clear to auscultation bilaterally. GI: Soft, nontender, non-distended  MS: No edema; No deformity. Chronic stasis dermatitis bilaterally Neuro:  Nonfocal   Labs    Chemistry Recent Labs  Lab 08/11/17 1400  08/12/17 0352  08/13/17 0305 08/13/17 0317 08/14/17 0412  NA 138   < > 139   < > 139 142 138  K 4.6   < > 4.0   < > 3.7 3.6 4.8  CL 106   < > 109   < > 111 111 108  CO2 25   < > 20*  --  19*  --  19*  GLUCOSE 259*   < > 152*   < > 114* 119* 166*  BUN 27*   < > 28*   < > 31* 26* 40*  CREATININE 1.77*   < > 1.64*   < > 2.04* 2.10* 3.55*  CALCIUM 7.8*   < > 8.2*  --  7.6*  --  7.8*  PROT 6.0*  --   --   --   --   --   --  ALBUMIN 3.0*  --   --   --   --   --   --   AST 112*  --   --   --   --   --   --   ALT 63  --   --   --   --   --   --   ALKPHOS 76  --   --   --   --   --   --   BILITOT 1.0  --   --   --   --   --   --   GFRNONAA 34*   < > 37*  --  28*  --  15*  GFRAA 39*   < > 43*  --  33*  --  17*  ANIONGAP 7   < > 10  --  9  --  11   < > = values in this interval not displayed.     Hematology Recent Labs  Lab 08/11/17 1808  08/13/17 0305 08/13/17 0317 08/14/17 0412  WBC 20.2*  --  12.1*  --  12.1*  RBC 4.71  --  4.48  --  4.28  HGB 14.7   < > 14.4 13.9 12.9*  HCT 44.7   < > 40.4 41.0 40.0  MCV 94.9  --  90.2  --  93.5  MCH 31.2  --  32.1  --  30.1  MCHC 32.9  --  35.6  --  32.3  RDW 12.9  --  13.1  --  14.0  PLT 199  --  217  --  196   < > = values in this interval not displayed.    Cardiac Enzymes Recent Labs  Lab 08/11/17 1808 08/11/17 2337 08/12/17 0352 08/12/17 0801  TROPONINI 0.46* 0.45* 0.39* 0.31*    Recent Labs  Lab 08/11/17 1352  TROPIPOC 0.10*     BNPNo results for input(s): BNP, PROBNP in the last 168 hours.   DDimer No results for input(s): DDIMER in the last 168  hours.   Radiology    Dg Chest Port 1 View  Result Date: 08/14/2017 CLINICAL DATA:  ETT present Dimas Millin failure EXAM: PORTABLE CHEST 1 VIEW COMPARISON:  08/13/2017 FINDINGS: The patient has a left-sided transvenous pacemaker with leads to the right atrium and right ventricle (off the image). A nasogastric tube is in place, tip off the film beyond the gastroesophageal junction. An endotracheal tube is in place, tip approximately 2 centimeters above the carina. There has been some improvement in aeration since the prior study. Significant bibasilar opacities persist which obscure the hemidiaphragms. The heart size is accentuated by technique. IMPRESSION: 1. Slight improvement in pulmonary edema. 2. Persistent bibasilar opacities. Electronically Signed   By: Nolon Nations M.D.   On: 08/14/2017 08:25   Dg Chest Port 1 View  Result Date: 08/13/2017 CLINICAL DATA:  Intubated patient.  Respiratory failure. EXAM: PORTABLE CHEST 1 VIEW COMPARISON:  08/11/2017 FINDINGS: Lung base opacity has increased when compared to the prior exam, now obscuring both hemidiaphragms. There is persistent interstitial thickening with hazy central airspace opacities consistent with pulmonary edema. No pneumothorax. Endotracheal tube and nasal/orogastric tube are stable in well positioned. IMPRESSION: 1. There is an increase in opacity at the lung bases compared to the previous day's study, which is consistent with enlargement of bilateral pleural effusions. 2. Persistent pulmonary edema, stable from the prior study. 3. Support apparatus is stable and well positioned. Electronically Signed   By: Shanon Brow  Ormond M.D.   On: 08/13/2017 09:10     Patient Profile     84 y.o. male with nonischemic cardiomyopathy who suffered OOH cardiopulmonary arrest    Assessment & Plan    1.  Out of hospital cardiac arrest: By history sounds like respiratory distress leading to full cardiac arrest.  Patient is hemodynamically  stable.  2.  Anoxic encephalopathy: By exam he is progressing compared to yesterday.  He is now much more awake and even following some commands.  Still moderately agitated.  Discussed his progress with his wife who is at the bedside.  3.  Acute kidney injury on background of chronic kidney disease: Creatinine increased today significantly from yesterday.  Urine output is low.  Would avoid further diuresis at present and follow trend.  Hopefully he will plateau in this range.  I discussed the possibility of worsening renal function, anuria, and what might be expected if he progresses to full renal failure with his wife.  They would not want any type of renal replacement therapy which is consistent with our initial discussion with cardiac arrest.  4.  Cardiogenic shock: The patient weaned off of norepinephrine yesterday.  Appears to be stable this morning.  Blood pressure is labile depending on his level of agitation.  Would avoid ACE/ARB/diuretics secondary to acute kidney injury.  5.  Acute on chronic systolic heart failure: As above, avoid heart failure medications at this point to prevent progressive acute kidney injury.  Chest x-ray reviewed with improving pulmonary edema.  The patient is critically ill with multiple organ systems failure and requires high complexity decision making for assessment and support, frequent evaluation and titration of therapies, application of advanced monitoring technologies and extensive interpretation of multiple databases.   Critical Care Time devoted to patient care services described in this note is 35 minutes.   For questions or updates, please contact [place] Please consult [email or web address] for contact info under Cardiology/STEMI.      Signed, Sherren Mocha, MD  08/14/2017, 9:50 AM

## 2017-08-14 NOTE — Progress Notes (Signed)
Called [facility] about starting tube feedings with OG output 700 mL on dayshift and around 250 in six hours of my shift. Orders to not start feedings and reconsider in the AM.

## 2017-08-15 ENCOUNTER — Inpatient Hospital Stay (HOSPITAL_COMMUNITY): Payer: Medicare Other

## 2017-08-15 DIAGNOSIS — I10 Essential (primary) hypertension: Secondary | ICD-10-CM

## 2017-08-15 LAB — GLUCOSE, CAPILLARY
GLUCOSE-CAPILLARY: 157 mg/dL — AB (ref 65–99)
Glucose-Capillary: 156 mg/dL — ABNORMAL HIGH (ref 65–99)
Glucose-Capillary: 181 mg/dL — ABNORMAL HIGH (ref 65–99)
Glucose-Capillary: 190 mg/dL — ABNORMAL HIGH (ref 65–99)
Glucose-Capillary: 192 mg/dL — ABNORMAL HIGH (ref 65–99)
Glucose-Capillary: 224 mg/dL — ABNORMAL HIGH (ref 65–99)

## 2017-08-15 LAB — BASIC METABOLIC PANEL
Anion gap: 10 (ref 5–15)
BUN: 57 mg/dL — ABNORMAL HIGH (ref 6–20)
CO2: 21 mmol/L — ABNORMAL LOW (ref 22–32)
CREATININE: 4.01 mg/dL — AB (ref 0.61–1.24)
Calcium: 7.8 mg/dL — ABNORMAL LOW (ref 8.9–10.3)
Chloride: 110 mmol/L (ref 101–111)
GFR calc Af Amer: 15 mL/min — ABNORMAL LOW (ref >60)
GFR, EST NON AFRICAN AMERICAN: 13 mL/min — AB (ref >60)
GLUCOSE: 218 mg/dL — AB (ref 65–99)
POTASSIUM: 4.5 mmol/L (ref 3.5–5.1)
SODIUM: 141 mmol/L (ref 135–145)

## 2017-08-15 LAB — CBC
HEMATOCRIT: 37.5 % — AB (ref 39.0–52.0)
Hemoglobin: 11.8 g/dL — ABNORMAL LOW (ref 13.0–17.0)
MCH: 29.7 pg (ref 26.0–34.0)
MCHC: 31.5 g/dL (ref 30.0–36.0)
MCV: 94.5 fL (ref 78.0–100.0)
PLATELETS: 184 10*3/uL (ref 150–400)
RBC: 3.97 MIL/uL — ABNORMAL LOW (ref 4.22–5.81)
RDW: 14.5 % (ref 11.5–15.5)
WBC: 11.6 10*3/uL — AB (ref 4.0–10.5)

## 2017-08-15 MED ORDER — DEXMEDETOMIDINE HCL IN NACL 400 MCG/100ML IV SOLN
0.2000 ug/kg/h | INTRAVENOUS | Status: DC
  Administered 2017-08-15: 0.5 ug/kg/h via INTRAVENOUS
  Administered 2017-08-15: 0.7 ug/kg/h via INTRAVENOUS
  Administered 2017-08-16: 0.5 ug/kg/h via INTRAVENOUS
  Administered 2017-08-16: 0.6 ug/kg/h via INTRAVENOUS
  Administered 2017-08-17: 0.7 ug/kg/h via INTRAVENOUS
  Administered 2017-08-17: 0.5 ug/kg/h via INTRAVENOUS
  Administered 2017-08-18: 0.7 ug/kg/h via INTRAVENOUS
  Administered 2017-08-18 – 2017-08-19 (×3): 0.5 ug/kg/h via INTRAVENOUS
  Filled 2017-08-15 (×13): qty 100

## 2017-08-15 MED ORDER — FUROSEMIDE 10 MG/ML IJ SOLN
40.0000 mg | Freq: Four times a day (QID) | INTRAMUSCULAR | Status: AC
  Administered 2017-08-15 (×2): 40 mg via INTRAVENOUS
  Filled 2017-08-15 (×2): qty 4

## 2017-08-15 MED ORDER — CLONIDINE HCL 0.1 MG/24HR TD PTWK
0.1000 mg | MEDICATED_PATCH | TRANSDERMAL | Status: DC
  Administered 2017-08-15: 0.1 mg via TRANSDERMAL
  Filled 2017-08-15: qty 1

## 2017-08-15 MED ORDER — FENTANYL BOLUS VIA INFUSION
25.0000 ug | INTRAVENOUS | Status: DC | PRN
  Administered 2017-08-15: 25 ug via INTRAVENOUS
  Filled 2017-08-15: qty 25

## 2017-08-15 MED ORDER — METOLAZONE 5 MG PO TABS
10.0000 mg | ORAL_TABLET | Freq: Once | ORAL | Status: AC
  Administered 2017-08-15: 10 mg via ORAL
  Filled 2017-08-15: qty 2

## 2017-08-15 MED ORDER — MAGNESIUM SULFATE 2 GM/50ML IV SOLN
2.0000 g | Freq: Once | INTRAVENOUS | Status: AC
  Administered 2017-08-15: 2 g via INTRAVENOUS
  Filled 2017-08-15: qty 50

## 2017-08-15 MED ORDER — FUROSEMIDE 10 MG/ML IJ SOLN
10.0000 mg/h | INTRAVENOUS | Status: DC
  Filled 2017-08-15: qty 25

## 2017-08-15 MED ORDER — MIDAZOLAM HCL 2 MG/2ML IJ SOLN
1.0000 mg | INTRAMUSCULAR | Status: DC | PRN
  Administered 2017-08-16: 1 mg via INTRAVENOUS
  Filled 2017-08-15: qty 2

## 2017-08-15 MED ORDER — MIDAZOLAM HCL 2 MG/2ML IJ SOLN
1.0000 mg | INTRAMUSCULAR | Status: DC | PRN
  Administered 2017-08-16 – 2017-08-18 (×3): 1 mg via INTRAVENOUS
  Filled 2017-08-15 (×3): qty 2

## 2017-08-15 MED ORDER — SODIUM CHLORIDE 0.9 % IV SOLN
25.0000 ug/h | INTRAVENOUS | Status: DC
  Administered 2017-08-15: 25 ug/h via INTRAVENOUS
  Filled 2017-08-15: qty 50

## 2017-08-15 NOTE — Progress Notes (Signed)
Progress Note  Patient Name: Malik Rojas Date of Encounter: 08/15/2017  Primary Cardiologist: No primary care provider on file.   Subjective   No history obtainable from patient. No family at bedside this afternoon.   Inpatient Medications    Scheduled Meds: . aspirin  81 mg Per Tube Daily  . carvedilol  25 mg Per Tube BID  . chlorhexidine gluconate (MEDLINE KIT)  15 mL Mouth Rinse BID  . cloNIDine  0.1 mg Transdermal Weekly  . feeding supplement (PRO-STAT SUGAR FREE 64)  30 mL Oral TID  . furosemide  40 mg Intravenous Q6H  . heparin injection (subcutaneous)  5,000 Units Subcutaneous Q8H  . insulin aspart  2-6 Units Subcutaneous Q4H  . mouth rinse  15 mL Mouth Rinse 10 times per day  . pantoprazole sodium  40 mg Per Tube QHS  . pravastatin  80 mg Per Tube q1800   Continuous Infusions: . dexmedetomidine (PRECEDEX) IV infusion 0.7 mcg/kg/hr (08/15/17 1056)  . feeding supplement (VITAL HIGH PROTEIN) 1,000 mL (08/14/17 1227)  . fentaNYL infusion INTRAVENOUS 100 mcg/hr (08/15/17 1437)  . norepinephrine (LEVOPHED) Adult infusion Stopped (08/14/17 0814)  . piperacillin-tazobactam (ZOSYN)  IV 2.25 g (08/15/17 1426)  . vancomycin     PRN Meds: albuterol, fentaNYL, hydrALAZINE, Influenza vac split quadrivalent PF, midazolam, midazolam, pneumococcal 23 valent vaccine   Vital Signs    Vitals:   08/15/17 1321 08/15/17 1400 08/15/17 1508 08/15/17 1544  BP: (!) 184/90 (!) 179/81  (!) 167/68  Pulse: 69 60  60  Resp: _0 Temp: (!) 96.7 F (35.9 C)   (!) 96.7 F (35.9 C)  TempSrc: Axillary   Axillary  SpO2: 96% 95% 93% 93%  Weight:      Height:        Intake/Output Summary (Last 24 hours) at 08/15/2017 1701 Last data filed at 08/15/2017 1400 Gross per 24 hour  Intake 1783.51 ml  Output 2265 ml  Net -481.49 ml   Filed Weights   08/11/17 1710 08/12/17 0400 08/13/17 0400  Weight: 230 lb 6.1 oz (104.5 kg) 228 lb 15.5 oz (103.9 kg) 226 lb 12.2 oz (102.9 kg)     Telemetry    NSR with PVC's - Personally Reviewed    Physical Exam  Intubated, sedated GEN: No acute distress.   Neck: No JVD Cardiac: RRR, no murmurs, rubs, or gallops.  Respiratory: Diminished breath sounds bilaterally. GI: Soft, nontender, non-distended  MS: diffuse 1+ edema; No deformity.  Labs    Chemistry Recent Labs  Lab 08/11/17 1400  08/13/17 0305 08/13/17 0317 08/14/17 0412 08/15/17 0329  NA 138   < > 139 142 138 141  K 4.6   < > 3.7 3.6 4.8 4.5  CL 106   < > 111 111 108 110  CO2 25   < > 19*  --  19* 21*  GLUCOSE 259*   < > 114* 119* 166* 218*  BUN 27*   < > 31* 26* 40* 57*  CREATININE 1.77*   < > 2.04* 2.10* 3.55* 4.01*  CALCIUM 7.8*   < > 7.6*  --  7.8* 7.8*  PROT 6.0*  --   --   --   --   --   ALBUMIN 3.0*  --   --   --   --   --   AST 112*  --   --   --   --   --   ALT 63  --   --   --   --   --  ALKPHOS 76  --   --   --   --   --   BILITOT 1.0  --   --   --   --   --   GFRNONAA 34*   < > 28*  --  15* 13*  GFRAA 39*   < > 33*  --  17* 15*  ANIONGAP 7   < > 9  --  11 10   < > = values in this interval not displayed.     Hematology Recent Labs  Lab 08/13/17 0305 08/13/17 0317 08/14/17 0412 08/15/17 0329  WBC 12.1*  --  12.1* 11.6*  RBC 4.48  --  4.28 3.97*  HGB 14.4 13.9 12.9* 11.8*  HCT 40.4 41.0 40.0 37.5*  MCV 90.2  --  93.5 94.5  MCH 32.1  --  30.1 29.7  MCHC 35.6  --  32.3 31.5  RDW 13.1  --  14.0 14.5  PLT 217  --  196 184    Cardiac Enzymes Recent Labs  Lab 08/11/17 1808 08/11/17 2337 08/12/17 0352 08/12/17 0801  TROPONINI 0.46* 0.45* 0.39* 0.31*    Recent Labs  Lab 08/11/17 1352  TROPIPOC 0.10*     BNPNo results for input(s): BNP, PROBNP in the last 168 hours.   DDimer No results for input(s): DDIMER in the last 168 hours.   Radiology    Dg Chest Portable 1 View  Result Date: 08/15/2017 CLINICAL DATA:  Respiratory failure EXAM: PORTABLE CHEST 1 VIEW COMPARISON:  08/14/2017 FINDINGS: Support devices are  stable. Cardiomegaly with vascular congestion. Bilateral airspace disease again noted, right greater than left, likely a combination of asymmetric edema and atelectasis, unchanged. Layering bilateral effusions. IMPRESSION: Continued CHF, bilateral effusions and bibasilar atelectasis. No real change. Electronically Signed   By: Rolm Baptise M.D.   On: 08/15/2017 09:19   Dg Chest Port 1 View  Result Date: 08/14/2017 CLINICAL DATA:  ETT present Dimas Millin failure EXAM: PORTABLE CHEST 1 VIEW COMPARISON:  08/13/2017 FINDINGS: The patient has a left-sided transvenous pacemaker with leads to the right atrium and right ventricle (off the image). A nasogastric tube is in place, tip off the film beyond the gastroesophageal junction. An endotracheal tube is in place, tip approximately 2 centimeters above the carina. There has been some improvement in aeration since the prior study. Significant bibasilar opacities persist which obscure the hemidiaphragms. The heart size is accentuated by technique. IMPRESSION: 1. Slight improvement in pulmonary edema. 2. Persistent bibasilar opacities. Electronically Signed   By: Nolon Nations M.D.   On: 08/14/2017 08:25   Dg Abd Portable 1v  Result Date: 08/14/2017 CLINICAL DATA:  Increased orogastric tube output. Respiratory failure, intubated patient. EXAM: PORTABLE ABDOMEN - 1 VIEW COMPARISON:  Chest x-ray of today's date. FINDINGS: The esophagogastric tube tip in proximal port lie in the mid to distal gastric body. The bowel gas pattern is unremarkable. A femoral catheter is in place on the right. IMPRESSION: No evidence of bowel obstruction or ileus. Reasonable positioning of the esophagogastric tube. Electronically Signed   By: David  Martinique M.D.   On: 08/14/2017 10:23     Patient Profile     84 y.o. male with nonischemic cardiomyopathy presenting with OOH cardiopulmonary arrest  Assessment & Plan    1. OOH Arrest 2. Anoxic encephalopathy 3. AKI, worsening 4.  Acute on chronic systolic heart failure  Reviewed notes. CCM team diuresing patient. I've had extensive discussion with family especially at his initial presentation and I would  support plans as outlined by Dr Nelda Marseille to move in a palliative direction if he shows no improvement over the next 24 hours.   For questions or updates, please contact [place] Please consult [email or web address] for contact info under Cardiology/STEMI.      Signed, Sherren Mocha, MD  08/15/2017, 5:01 PM

## 2017-08-15 NOTE — Progress Notes (Signed)
Adjusting medication for sedation. Rested a few hours this afternoon, very drowsy, when at change of shift, awoke, and followed some commands. Diuresed well post lasix. Family updated thoughout  the day. BP still elevated despite hydralazine, catapress and lasix. More lasix due this PM.

## 2017-08-15 NOTE — Progress Notes (Signed)
PULMONARY / CRITICAL CARE MEDICINE   Name: Malik Rojas MRN: [id number] DOB: [birth year]    ADMISSION DATE:  08/11/2017 CONSULTATION DATE:  08/11/17  REFERRING MD:  Jeanell Sparrow  CHIEF COMPLAINT:  Cardiac Arrest  HISTORY OF PRESENT ILLNESS:  84 y.o. male with PMH as outlined below. He presented to [facility] ED 1/7 after cardiac arrest at home.  Per his wife, he had SOB that morning but wanted to go and work on his car.  He then collapsed and was agonal.  By the time EMS arrived, he was pulseless.  CPR was performed for 37 minutes prior to ROSC.  There was questions whether he initially had PEA or V.fib. Per report, pt had been feeling bad for the past week with respiratory symptoms.  He apparently saw his PCP and was told that he had a respiratory infection.   Prior to arrest, no sudden worsening, no chest pain. No recent fevers/chills/sweats, N/V/D, abd pain, myalgias.  In ED, he remained unresponsive.  Vitals were stable.  He was seen by cardiology and was not felt to be a candidate for emergent cath lab.  Given that his arrest was witnessed, downtime of 37 minutes, continued unresponsive and non-purposeful state post ROSC, he will be started on targeted temperature management with goal 33 degrees.  Wife states that pt is full code; however, he would not want prolonged heroic measures.   SUBJECTIVE:    Sedated on ventilator.  Moves all extremities x4.  Does not follow commands for me.  VITAL SIGNS: BP (!) 121/49   Pulse 73   Temp 98.6 F (37 C) (Oral)   Resp (!) 0   Ht 5\' 10"  (1.778 m)   Wt 102.9 kg (226 lb 12.2 oz)   SpO2 94%   BMI 32.54 kg/m   HEMODYNAMICS:    VENTILATOR SETTINGS: Vent Mode: PRVC FiO2 (%):  [50 %-60 %] 50 % Set Rate:  [20 bmp] 20 bmp Vt Set:  [590 mL] 590 mL PEEP:  [5 cmH20] 5 cmH20 Plateau Pressure:  [18 cmH20-21 cmH20] 20 cmH20  INTAKE / OUTPUT: I/O last 3 completed shifts: In: 1691.5 [I.V.:54; NG/GT:1187.5; IV Piggyback:450] Out: 0962 [Urine:1020; Emesis/NG  output:575]   PHYSICAL EXAMINATION: General: Obese male on ventilator requiring sedation. HEENT: Endotracheal tube to ventilator NG tube to tube feedings PSY: Not available Neuro: Moves all extremities x4 does not follow commands for me but has been reported to follow commands intermittently CV: Ventricular paced at 60 PULM: Coarse rhonchi bilaterally GI: Obese soft nontender Extremities: warm/dry, 2+ edema Skin: no rashes or lesions  LABS:  BMET Recent Labs  Lab 08/13/17 0305 08/13/17 0317 08/14/17 0412 08/15/17 0329  NA 139 142 138 141  K 3.7 3.6 4.8 4.5  CL 111 111 108 110  CO2 19*  --  19* 21*  BUN 31* 26* 40* 57*  CREATININE 2.04* 2.10* 3.55* 4.01*  GLUCOSE 114* 119* 166* 218*   Electrolytes Recent Labs  Lab 08/11/17 2337  08/13/17 0305 08/14/17 0412 08/15/17 0329  CALCIUM 7.9*   < > 7.6* 7.8* 7.8*  MG 1.7  --  1.8 1.8  --   PHOS  --   --  2.4* 5.3*  --    < > = values in this interval not displayed.   CBC Recent Labs  Lab 08/13/17 0305 08/13/17 0317 08/14/17 0412 08/15/17 0329  WBC 12.1*  --  12.1* 11.6*  HGB 14.4 13.9 12.9* 11.8*  HCT 40.4 41.0 40.0 37.5*  PLT 217  --  196 [place]  Lab 08/11/17 1400 08/11/17 1808 08/11/17 2337  APTT  --  30 30  INR 1.16 1.17 1.11    Sepsis Markers Recent Labs  Lab 08/11/17 1355 08/11/17 1400  LATICACIDVEN 3.87* 3.4*    ABG Recent Labs  Lab 08/11/17 1539 08/11/17 1805 08/11/17 1824  PHART 7.199* 7.426 7.470*  PCO2ART 67.1* 33.0 29.9*  PO2ART 109.0* 76.0* 86.0    Liver Enzymes Recent Labs  Lab 08/11/17 1400  AST 112*  ALT 63  ALKPHOS 76  BILITOT 1.0  ALBUMIN 3.0*    Cardiac Enzymes Recent Labs  Lab 08/11/17 2337 08/12/17 0352 08/12/17 0801  TROPONINI 0.45* 0.39* 0.31*    Glucose Recent Labs  Lab 08/14/17 1212 08/14/17 1612 08/14/17 1936 08/15/17 0046 08/15/17 0410 08/15/17 0838  GLUCAP 160* 157* 138* 192* 190* 156*    Imaging Dg Chest Portable  1 View  Result Date: 08/15/2017 CLINICAL DATA:  Respiratory failure EXAM: PORTABLE CHEST 1 VIEW COMPARISON:  08/14/2017 FINDINGS: Support devices are stable. Cardiomegaly with vascular congestion. Bilateral airspace disease again noted, right greater than left, likely a combination of asymmetric edema and atelectasis, unchanged. Layering bilateral effusions. IMPRESSION: Continued CHF, bilateral effusions and bibasilar atelectasis. No real change. Electronically Signed   By: Rolm Baptise M.D.   On: 08/15/2017 09:19   Dg Abd Portable 1v  Result Date: 08/14/2017 CLINICAL DATA:  Increased orogastric tube output. Respiratory failure, intubated patient. EXAM: PORTABLE ABDOMEN - 1 VIEW COMPARISON:  Chest x-ray of today's date. FINDINGS: The esophagogastric tube tip in proximal port lie in the mid to distal gastric body. The bowel gas pattern is unremarkable. A femoral catheter is in place on the right. IMPRESSION: No evidence of bowel obstruction or ileus. Reasonable positioning of the esophagogastric tube. Electronically Signed   By: David  Martinique M.D.   On: 08/14/2017 10:23    STUDIES:  Echo 1/7 > EF 25-30%, diffuse hypokinesis, severe LVH, mid MR EEG 1/7 > This EEG is markedly abnormal due to burst-suppression pattern.  Clinical Correlation of the above findings indicates severe diffuse cerebral dysfunction that is non-specific in etiology and can be seen in the setting of anoxic/ischemic injury, toxic/metabolic encephalopathies, or medication effect from Propofol and Fentanyl. There were no electrographic seizures in this study. Clinical correlation is advised. LE duplex 1/7 >  CT head 1/7 > no ICH  CULTURES: Blood 1/7 > 1/2 blood cultures positive>>coag neg staph Sputum 1/7 >   ANTIBIOTICS: 08/12/2017 vancomycin>> 1/8 zosyn>>>  SIGNIFICANT EVENTS: 1/7 > admit.  LINES/TUBES: ETT 1/7 >  R fem CVL 1/7 > 1/10 Art line  1/7 >   DISCUSSION: 84 y.o. male admitted 1/7 with cardiac arrest.   Likely respiratory leading to cardiac.  ASSESSMENT / PLAN:  PULMONARY A: Acute hypoxic respiratory failure - s/p cardiac arrest likely related at least in part to pulmonary edema.  P:   Vent support - 8cc/kg  F/u CXR  F/u ABG Daily SBT -failed at this a.m. related to poor patient effort VAP prevention measures. Diuresis as tolerated -hold further diuresis today related to bump in creatinine after Lasix yesterday BLE Dopplers pending CXR daily  CARDIOVASCULAR A:  Cardiac Arrest - presumed initial respiratory leading to cardiac. Hx HTN, HLD, CHB combined heart failure (Echo from April 2018 with EF 45 - 50%, G1DD), CAD. P:  S/p 33 degree TTM  Echo as above, severe LV dysfunction, EF 25-30% Trend troponin. Cardiology following. Hold further diuresis today with jump  in Scr and decreased UOP Continue preadmission ASA, pravastatin, coreg. Continue to hold preadmission furosemide, imdur, lisinopril with labile BP. D/c femoral CVL - has adequate peripheral access, no pressors or sedation gtts   RENAL AKI on CKD Lab Results  Component Value Date   CREATININE 4.01 (H) 08/15/2017   CREATININE 3.55 (H) 08/14/2017   CREATININE 2.10 (H) 08/13/2017   CREATININE 1.58 (H) 11/13/2016   CREATININE 1.65 (H) 07/23/2016   CREATININE 1.67 (H) 10/12/2015     P:   Correct electrolytes as needed Follow-up chemistry Hold preadmission lisinopril. Hold further lasix today   GASTROINTESTINAL A:   GI prophylaxis. Nutrition. Increased OG outpt P:   SUP: Pantoprazole. NPO  Check KUB 08/14/2017 which was negative Tube feeds in place  HEMATOLOGIC A:   VTE Prophylaxis. P:  SCD's / heparin. CBC as needed  INFECTIOUS A:   08/12/2017 noted to have 1 of 2 blood cultures positive for MRSA - ?contaminant  P:   Has indwelling AICD  Continue vanc/zosyn  Repeat BC 1/10  ENDOCRINE CBG (last 3)  Recent Labs    08/15/17 0046 08/15/17 0410 08/15/17 0838  GLUCAP 192* 190* 156*    A:    Hx DM. P:   ICU hyperglycemia protocol. Hold preadmission glargine.  NEUROLOGIC A:   At risk for anoxic encephalopathy. AMS  08/15/2013 making slow progress towards less confused P:   RASS goal = 0  PRN fentanyl  Hold off on neuro input for now with improving mental status  Avoid sedating medications as able    Family updated: 08/14/2017 no family at bedside  Interdisciplinary Family Meeting v Palliative Care Meeting:  Due by: 08/17/17.  CC time: 30 min.  Richardson Landry Minor ACNP Maryanna Shape PCCM Pager [phone] till 1 pm If no answer page [phone] 08/15/2017, 9:28 AM  Attending Note:  84 year old male with extensive PMH who presents with the hospital post cardiac arrest that is now off pressors and agitated.  On exam, lungs are clear, patient is tracking but not following commands.  I reviewed CXR myself, diffuse pulmonary edema noted.  Spoke with family extensively, patient never wanted that level of care.  Will make DNR.  No dialysis or trach/peg.  Will give patient a few days to declare himself from a neurologic standpoint then will determine one way extubation vs comfort.  Lasix drip to be started today with zaroxolyn now that patient is hypertensive.  Start precedex.  Start clonidine patch at 0.1.  D/C propofol.  The patient is critically ill with multiple organ systems failure and requires high complexity decision making for assessment and support, frequent evaluation and titration of therapies, application of advanced monitoring technologies and extensive interpretation of multiple databases.   Critical Care Time devoted to patient care services described in this note is  35  Minutes. This time reflects time of care of this signee Dr Jennet Maduro. This critical care time does not reflect procedure time, or teaching time or supervisory time of PA/NP/Med student/Med Resident etc but could involve care discussion time.  Rush Farmer, M.D. [facility] Pulmonary/Critical Care  Medicine. Pager: [phone]. After hours pager: [phone].

## 2017-08-15 NOTE — Plan of Care (Signed)
Patient neurologically labile. Not following commands most times. Family in and out of the room, updated on progress. Weaned down fentanyl to off, just on precedex. Sommulent most of the afternoon. When giving report to oncoming shift, patient awoke to command, restless, but followed commands of moving and gripping with hands, and wiggled left toes. Diuresing well after lasix Iv. BP still elevated despite hydralazine Iv,  catapress patch, and lasix. Oncoming shift will address.

## 2017-08-16 ENCOUNTER — Inpatient Hospital Stay (HOSPITAL_COMMUNITY): Payer: Medicare Other

## 2017-08-16 DIAGNOSIS — G9340 Encephalopathy, unspecified: Secondary | ICD-10-CM

## 2017-08-16 LAB — BLOOD GAS, ARTERIAL
ACID-BASE EXCESS: 1.3 mmol/L (ref 0.0–2.0)
BICARBONATE: 24.8 mmol/L (ref 20.0–28.0)
DRAWN BY: 511471
FIO2: 40
LHR: 20 {breaths}/min
MECHVT: 590 mL
O2 Saturation: 94 %
PEEP/CPAP: 5 cmH2O
Patient temperature: 98.6
pCO2 arterial: 36 mmHg (ref 32.0–48.0)
pH, Arterial: 7.453 — ABNORMAL HIGH (ref 7.350–7.450)
pO2, Arterial: 68.1 mmHg — ABNORMAL LOW (ref 83.0–108.0)

## 2017-08-16 LAB — CULTURE, BLOOD (ROUTINE X 2): Culture: NO GROWTH

## 2017-08-16 LAB — BASIC METABOLIC PANEL
ANION GAP: 11 (ref 5–15)
BUN: 62 mg/dL — ABNORMAL HIGH (ref 6–20)
CALCIUM: 8.4 mg/dL — AB (ref 8.9–10.3)
CO2: 22 mmol/L (ref 22–32)
Chloride: 107 mmol/L (ref 101–111)
Creatinine, Ser: 3.42 mg/dL — ABNORMAL HIGH (ref 0.61–1.24)
GFR calc non Af Amer: 15 mL/min — ABNORMAL LOW (ref >60)
GFR, EST AFRICAN AMERICAN: 18 mL/min — AB (ref >60)
Glucose, Bld: 212 mg/dL — ABNORMAL HIGH (ref 65–99)
Potassium: 3.6 mmol/L (ref 3.5–5.1)
Sodium: 140 mmol/L (ref 135–145)

## 2017-08-16 LAB — CBC
HCT: 41.7 % (ref 39.0–52.0)
HEMOGLOBIN: 14.1 g/dL (ref 13.0–17.0)
MCH: 31.4 pg (ref 26.0–34.0)
MCHC: 33.8 g/dL (ref 30.0–36.0)
MCV: 92.9 fL (ref 78.0–100.0)
Platelets: 209 10*3/uL (ref 150–400)
RBC: 4.49 MIL/uL (ref 4.22–5.81)
RDW: 14.2 % (ref 11.5–15.5)
WBC: 9.4 10*3/uL (ref 4.0–10.5)

## 2017-08-16 LAB — GLUCOSE, CAPILLARY
GLUCOSE-CAPILLARY: 215 mg/dL — AB (ref 65–99)
GLUCOSE-CAPILLARY: 225 mg/dL — AB (ref 65–99)
GLUCOSE-CAPILLARY: 227 mg/dL — AB (ref 65–99)
GLUCOSE-CAPILLARY: 228 mg/dL — AB (ref 65–99)
Glucose-Capillary: 227 mg/dL — ABNORMAL HIGH (ref 65–99)
Glucose-Capillary: 226 mg/dL — ABNORMAL HIGH (ref 65–99)

## 2017-08-16 LAB — PHOSPHORUS: PHOSPHORUS: 4 mg/dL (ref 2.5–4.6)

## 2017-08-16 LAB — MAGNESIUM: MAGNESIUM: 2.3 mg/dL (ref 1.7–2.4)

## 2017-08-16 MED ORDER — POTASSIUM CHLORIDE 20 MEQ PO PACK
20.0000 meq | PACK | Freq: Once | ORAL | Status: AC
  Administered 2017-08-16: 20 meq via ORAL
  Filled 2017-08-16 (×2): qty 1

## 2017-08-16 MED ORDER — ISOSORBIDE DINITRATE 10 MG PO TABS
10.0000 mg | ORAL_TABLET | Freq: Three times a day (TID) | ORAL | Status: DC
  Administered 2017-08-16 – 2017-08-26 (×32): 10 mg via ORAL
  Filled 2017-08-16 (×35): qty 1

## 2017-08-16 MED ORDER — FUROSEMIDE 10 MG/ML IJ SOLN
40.0000 mg | Freq: Four times a day (QID) | INTRAMUSCULAR | Status: AC
  Administered 2017-08-16 (×3): 40 mg via INTRAVENOUS
  Filled 2017-08-16 (×3): qty 4

## 2017-08-16 MED ORDER — HYDRALAZINE HCL 25 MG PO TABS
25.0000 mg | ORAL_TABLET | Freq: Three times a day (TID) | ORAL | Status: DC
  Administered 2017-08-16 – 2017-08-18 (×6): 25 mg via ORAL
  Filled 2017-08-16 (×6): qty 1

## 2017-08-16 MED ORDER — POTASSIUM CHLORIDE 20 MEQ/15ML (10%) PO SOLN
40.0000 meq | Freq: Three times a day (TID) | ORAL | Status: AC
  Administered 2017-08-16 (×2): 40 meq
  Filled 2017-08-16 (×2): qty 30

## 2017-08-16 MED ORDER — METOLAZONE 5 MG PO TABS: 10.0000 mg | ORAL_TABLET | Freq: Once | ORAL | Status: DC

## 2017-08-16 MED ORDER — METOLAZONE 2.5 MG PO TABS
2.5000 mg | ORAL_TABLET | Freq: Once | ORAL | Status: AC
  Administered 2017-08-16: 2.5 mg via ORAL
  Filled 2017-08-16: qty 1

## 2017-08-16 NOTE — Plan of Care (Signed)
Improving mentation, respiratory weaned 4 hours today. Looking at slow wean to extubated and potential rehab bridge to home.  Family involved in care.

## 2017-08-16 NOTE — Progress Notes (Signed)
PULMONARY / CRITICAL CARE MEDICINE   Name: Malik Rojas MRN: [id number] DOB: [birth year]    ADMISSION DATE:  08/11/2017 CONSULTATION DATE:  08/11/17  REFERRING MD:  Jeanell Sparrow  CHIEF COMPLAINT:  Cardiac Arrest  HISTORY OF PRESENT ILLNESS:  84 y.o. male with PMH as outlined below. He presented to [facility] ED 1/7 after cardiac arrest at home.  Per his wife, he had SOB that morning but wanted to go and work on his car.  He then collapsed and was agonal.  By the time EMS arrived, he was pulseless.  CPR was performed for 37 minutes prior to ROSC.  There was questions whether he initially had PEA or V.fib. Per report, pt had been feeling bad for the past week with respiratory symptoms.  He apparently saw his PCP and was told that he had a respiratory infection.   Prior to arrest, no sudden worsening, no chest pain. No recent fevers/chills/sweats, N/V/D, abd pain, myalgias.  In ED, he remained unresponsive.  Vitals were stable.  He was seen by cardiology and was not felt to be a candidate for emergent cath lab.  Given that his arrest was witnessed, downtime of 37 minutes, continued unresponsive and non-purposeful state post ROSC, he will be started on targeted temperature management with goal 33 degrees.  Wife states that pt is full code; however, he would not want prolonged heroic measures.   SUBJECTIVE:    No events overnight, weaning  VITAL SIGNS: BP (!) 141/57   Pulse 60   Temp 99.4 F (37.4 C) (Oral)   Resp 20   Ht 5\' 10"  (1.778 m)   Wt 101.2 kg (223 lb 1.7 oz)   SpO2 94%   BMI 32.01 kg/m   HEMODYNAMICS:    VENTILATOR SETTINGS: Vent Mode: PSV;CPAP FiO2 (%):  [40 %] 40 % Set Rate:  [20 bmp] 20 bmp Vt Set:  [590 mL] 590 mL PEEP:  [5 cmH20] 5 cmH20 Pressure Support:  [10 cmH20] 10 cmH20 Plateau Pressure:  [9 cmH20-22 cmH20] 20 cmH20  INTAKE / OUTPUT: I/O last 3 completed shifts: In: 3095.4 [I.V.:563.4; NG/GT:1722; IV GURKYHCWC:376] Out: 2831 [Urine:8340]  PHYSICAL  EXAMINATION: General: Obese male, off sedation  HEENT: [place]/AT, PERRL, EOM-I and MMM PSY: Not available Neuro: Awake and interactive, following commands, moving all ext to command CV: RRR, Nl S1/S2 and -M/R/G. PULM: Coarse rhonchi bilaterally GI: Obese soft nontender Extremities: warm/dry, 2+ edema Skin: no rashes or lesions  LABS:  BMET Recent Labs  Lab 08/14/17 0412 08/15/17 0329 08/16/17 0301  NA 138 141 140  K 4.8 4.5 3.6  CL 108 110 107  CO2 19* 21* 22  BUN 40* 57* 62*  CREATININE 3.55* 4.01* 3.42*  GLUCOSE 166* 218* 212*   Electrolytes Recent Labs  Lab 08/13/17 0305 08/14/17 0412 08/15/17 0329 08/16/17 0301  CALCIUM 7.6* 7.8* 7.8* 8.4*  MG 1.8 1.8  --  2.3  PHOS 2.4* 5.3*  --  4.0   CBC Recent Labs  Lab 08/14/17 0412 08/15/17 0329 08/16/17 0301  WBC 12.1* 11.6* 9.4  HGB 12.9* 11.8* 14.1  HCT 40.0 37.5* 41.7  PLT 196 184 209   Coag's Recent Labs  Lab 08/11/17 1400 08/11/17 1808 08/11/17 2337  APTT  --  30 30  INR 1.16 1.17 1.11   Sepsis Markers Recent Labs  Lab 08/11/17 1355 08/11/17 1400  LATICACIDVEN 3.87* 3.4*   ABG Recent Labs  Lab 08/11/17 1805 08/11/17 1824 08/16/17 0610  PHART 7.426 7.470* 7.453*  PCO2ART 33.0  29.9* 36.0  PO2ART 76.0* 86.0 68.1*   Liver Enzymes Recent Labs  Lab 08/11/17 1400  AST 112*  ALT 63  ALKPHOS 76  BILITOT 1.0  ALBUMIN 3.0*   Cardiac Enzymes Recent Labs  Lab 08/11/17 2337 08/12/17 0352 08/12/17 0801  TROPONINI 0.45* 0.39* 0.31*   Glucose Recent Labs  Lab 08/15/17 1323 08/15/17 1546 08/15/17 2027 08/16/17 0000 08/16/17 0414 08/16/17 0836  GLUCAP 181* 157* 224* 215* 227* 227*   Imaging No results found.  STUDIES:  Echo 1/7 > EF 25-30%, diffuse hypokinesis, severe LVH, mid MR EEG 1/7 > This EEG is markedly abnormal due to burst-suppression pattern.  Clinical Correlation of the above findings indicates severe diffuse cerebral dysfunction that is non-specific in etiology and can  be seen in the setting of anoxic/ischemic injury, toxic/metabolic encephalopathies, or medication effect from Propofol and Fentanyl. There were no electrographic seizures in this study. Clinical correlation is advised. LE duplex 1/7 >  CT head 1/7 > no ICH  CULTURES: Blood 1/7 > 1/2 blood cultures positive>>coag neg staph Sputum 1/7 >   ANTIBIOTICS: 08/12/2017 vancomycin>> 1/8 zosyn>>>  SIGNIFICANT EVENTS: 1/7 > admit.  LINES/TUBES: ETT 1/7 >  R fem CVL 1/7 > 1/10 Art line  1/7 >   DISCUSSION: 84 y.o. male admitted 1/7 with cardiac arrest.  Likely respiratory leading to cardiac.  ASSESSMENT / PLAN:  PULMONARY A: Acute hypoxic respiratory failure - s/p cardiac arrest likely related at least in part to pulmonary edema.  P:   Begin PS trials F/u CXR  F/u ABG VAP prevention measures. Likely extubate in AM BLE Dopplers pending CXR daily  CARDIOVASCULAR A:  Cardiac Arrest - presumed initial respiratory leading to cardiac. Hx HTN, HLD, CHB combined heart failure (Echo from April 2018 with EF 45 - 50%, G1DD), CAD. P:  S/p 33 degree TTM  Echo as above, severe LV dysfunction, EF 25-30% Trend troponin. Cardiology following. Lasix 40 mg IV q6 x3 doses Continue preadmission ASA, pravastatin, coreg. Continue to hold preadmission furosemide, imdur, lisinopril with labile BP.  RENAL AKI on CKD Lab Results  Component Value Date   CREATININE 3.42 (H) 08/16/2017   CREATININE 4.01 (H) 08/15/2017   CREATININE 3.55 (H) 08/14/2017   CREATININE 1.58 (H) 11/13/2016   CREATININE 1.65 (H) 07/23/2016   CREATININE 1.67 (H) 10/12/2015    P:   Correct electrolytes as needed Follow-up chemistry Hold preadmission lisinopril. Lasix 40 mg IV q6 x3 doses Zaroxolyn 10 mg IV x1  GASTROINTESTINAL A:   GI prophylaxis. Nutrition. Increased OG outpt P:   SUP: Pantoprazole. NPO  Check KUB 08/14/2017 which was negative TF per nutrition  HEMATOLOGIC A:   VTE Prophylaxis. P:   SCD's / heparin. CBC as needed  INFECTIOUS A:   08/12/2017 noted to have 1 of 2 blood cultures positive for MRSA - ?contaminant  P:   Has indwelling AICD  Continue vanc/zosyn  Repeat BC 1/10  ENDOCRINE CBG (last 3)  Recent Labs    08/16/17 0000 08/16/17 0414 08/16/17 0836  GLUCAP 215* 227* 227*   A:   Hx DM. P:   ICU hyperglycemia protocol. Hold preadmission glargine.  NEUROLOGIC A:   At risk for anoxic encephalopathy. AMS  08/15/2013 making slow progress towards less confused P:   RASS goal = 0  PRN fentanyl  Hold off on neuro input for now with improving mental status  Avoid sedating medications as able   Family updated: Family updated 1/12, DNR  Interdisciplinary Family Meeting v Palliative  Care Meeting:  Due by: 08/17/17.  The patient is critically ill with multiple organ systems failure and requires high complexity decision making for assessment and support, frequent evaluation and titration of therapies, application of advanced monitoring technologies and extensive interpretation of multiple databases.   Critical Care Time devoted to patient care services described in this note is  35  Minutes. This time reflects time of care of this signee Dr Jennet Maduro. This critical care time does not reflect procedure time, or teaching time or supervisory time of PA/NP/Med student/Med Resident etc but could involve care discussion time.  Rush Farmer, M.D. [facility] Pulmonary/Critical Care Medicine. Pager: [phone]. After hours pager: [phone].

## 2017-08-16 NOTE — Plan of Care (Signed)
Patient has been responsive to voice throughout night.  He has also followed commands to grip, move toes, and nodded no to pain.  His vitals have remained stable but has had elevated BP which is already being addressed.  Awaiting blood gas results to possibly work toward extubation if patient continues on this coarse.

## 2017-08-16 NOTE — Progress Notes (Signed)
Patient ID: Malik Rojas, male   DOB: 1932-03-04, 84 y.o.   MRN: [id number]     Advanced Heart Failure Rounding Note  PCP:  Primary Cardiologist: Caryl Comes  Subjective:    Good diuresis yesterday, creatinine down from 4 => 3.4.  BP running high.  Weight down.  He awakens and follows commands.   Echo: severe LVH, EF 25-30%.    Objective:   Weight Range: 223 lb 1.7 oz (101.2 kg) Body mass index is 32.01 kg/m.   Vital Signs:   Temp:  [96.7 F (35.9 C)-99.4 F (37.4 C)] 99.4 F (37.4 C) (01/12 0833) Pulse Rate:  [59-69] 69 (01/12 1000) Resp:  [0-20] 20 (01/12 0347) BP: (141-184)/(56-107) 173/62 (01/12 1000) SpO2:  [92 %-96 %] 96 % (01/12 1000) FiO2 (%):  [40 %] 40 % (01/12 0837) Weight:  [223 lb 1.7 oz (101.2 kg)] 223 lb 1.7 oz (101.2 kg) (01/12 0347) Last BM Date: 08/16/17  Weight change: Filed Weights   08/12/17 0400 08/13/17 0400 08/16/17 0347  Weight: 228 lb 15.5 oz (103.9 kg) 226 lb 12.2 oz (102.9 kg) 223 lb 1.7 oz (101.2 kg)    Intake/Output:   Intake/Output Summary (Last 24 hours) at 08/16/2017 1042 Last data filed at 08/16/2017 1000 Gross per 24 hour  Intake 2830.31 ml  Output 8025 ml  Net -5194.69 ml      Physical Exam    General: Intubated/sedated.  Neck: Thick, JVP difficult, no thyromegaly or thyroid nodule.  Lungs: Dependent crackles CV: Nondisplaced PMI.  Heart regular S1/S2, no S3/S4, no murmur.  Trace ankle edema.  Abdomen: Soft, nontender, no hepatosplenomegaly, no distention.  Skin: Intact without lesions or rashes.  Neurologic: Awake and follows commands Extremities: No clubbing or cyanosis.  HEENT: Normal.    Telemetry   NSR with RV pacing (personally reviewed)   Labs    CBC Recent Labs    08/15/17 0329 08/16/17 0301  WBC 11.6* 9.4  HGB 11.8* 14.1  HCT 37.5* 41.7  MCV 94.5 92.9  PLT 184 239   Basic Metabolic Panel Recent Labs    08/14/17 0412 08/15/17 0329 08/16/17 0301  NA 138 141 140  K 4.8 4.5 3.6  CL 108 110 107   CO2 19* 21* 22  GLUCOSE 166* 218* 212*  BUN 40* 57* 62*  CREATININE 3.55* 4.01* 3.42*  CALCIUM 7.8* 7.8* 8.4*  MG 1.8  --  2.3  PHOS 5.3*  --  4.0   Liver Function Tests No results for input(s): AST, ALT, ALKPHOS, BILITOT, PROT, ALBUMIN in the last 72 hours. No results for input(s): LIPASE, AMYLASE in the last 72 hours. Cardiac Enzymes No results for input(s): CKTOTAL, CKMB, CKMBINDEX, TROPONINI in the last 72 hours.  BNP: BNP (last 3 results) Recent Labs    11/06/16 1656  BNP 425.0*    ProBNP (last 3 results) No results for input(s): PROBNP in the last 8760 hours.   D-Dimer No results for input(s): DDIMER in the last 72 hours. Hemoglobin A1C No results for input(s): HGBA1C in the last 72 hours. Fasting Lipid Panel No results for input(s): CHOL, HDL, LDLCALC, TRIG, CHOLHDL, LDLDIRECT in the last 72 hours. Thyroid Function Tests No results for input(s): TSH, T4TOTAL, T3FREE, THYROIDAB in the last 72 hours.  Invalid input(s): FREET3  Other results:   Imaging     No results found.   Medications:     Scheduled Medications: . aspirin  81 mg Per Tube Daily  . carvedilol  25 mg Per Tube  BID  . chlorhexidine gluconate (MEDLINE KIT)  15 mL Mouth Rinse BID  . cloNIDine  0.1 mg Transdermal Weekly  . feeding supplement (PRO-STAT SUGAR FREE 64)  30 mL Oral TID  . furosemide  40 mg Intravenous Q6H  . heparin injection (subcutaneous)  5,000 Units Subcutaneous Q8H  . hydrALAZINE  25 mg Oral Q8H  . insulin aspart  2-6 Units Subcutaneous Q4H  . isosorbide dinitrate  10 mg Oral TID  . mouth rinse  15 mL Mouth Rinse 10 times per day  . metolazone  2.5 mg Oral Once  . pantoprazole sodium  40 mg Per Tube QHS  . potassium chloride  40 mEq Per Tube TID  . pravastatin  80 mg Per Tube q1800     Infusions: . dexmedetomidine (PRECEDEX) IV infusion Stopped (08/16/17 0820)  . feeding supplement (VITAL HIGH PROTEIN) 1,000 mL (08/14/17 1227)  . fentaNYL infusion INTRAVENOUS  Stopped (08/15/17 1600)  . norepinephrine (LEVOPHED) Adult infusion Stopped (08/14/17 9622)  . piperacillin-tazobactam (ZOSYN)  IV Stopped (08/16/17 2979)  . vancomycin Stopped (08/15/17 2223)     PRN Medications:  albuterol, fentaNYL, hydrALAZINE, Influenza vac split quadrivalent PF, midazolam, midazolam, pneumococcal 23 valent vaccine    Patient Profile   84 yo with history of nonischemic cardiomyopathy, nonobstructive CAD, CHB s/p MDT PPM presented with cardiac arrest.   Assessment/Plan   1. Cardiac arrest: Prolonged CPR.  ?Initial rhythm (PEA versus VF).  He underwent hypothermia protocol.  This morning, he opens his eyes and follows commands.  2. Pulmonary: Intubated.  Pulmonary edema on CXR, also covering with vancomycin/Zosyn for PNA/aspiration.  3. Acute on chronic systolic CHF: NICM in past, EF lower now at 25-30%.  Pulmonary edema on CXR.  - Agree with diuresis with Lasix + metolazone today => responded well yesterday, creatinine trending down.  - BP high, add hydralazine/isordil per tube.  - Continue Coreg 25 mg bid.  - If recovers, would likely benefit CRT with chronic RV pacing.  - Cannot rule out ischemic event, has known CAD though nonobstructive in past.  Hold off on cath with elevated creatinine.  4. AKI: Creatinine trending down with diuresis.  5. HTN: Adding hydralazine/Imdur as above.  He has been on clonidine.   Length of Stay: [place], MD  08/16/2017, 10:42 AM  Advanced Heart Failure Team Pager [phone] (M-F; 7a - 4p)  Please contact [facility] for night-coverage after hours (4p -7a ) and weekends on [email or web address]

## 2017-08-17 ENCOUNTER — Inpatient Hospital Stay (HOSPITAL_COMMUNITY): Payer: Medicare Other

## 2017-08-17 ENCOUNTER — Other Ambulatory Visit: Payer: Self-pay

## 2017-08-17 DIAGNOSIS — J810 Acute pulmonary edema: Secondary | ICD-10-CM | POA: Diagnosis present

## 2017-08-17 LAB — GLUCOSE, CAPILLARY
GLUCOSE-CAPILLARY: 189 mg/dL — AB (ref 65–99)
GLUCOSE-CAPILLARY: 209 mg/dL — AB (ref 65–99)
GLUCOSE-CAPILLARY: 225 mg/dL — AB (ref 65–99)
GLUCOSE-CAPILLARY: 272 mg/dL — AB (ref 65–99)
GLUCOSE-CAPILLARY: 277 mg/dL — AB (ref 65–99)
GLUCOSE-CAPILLARY: 286 mg/dL — AB (ref 65–99)
Glucose-Capillary: 209 mg/dL — ABNORMAL HIGH (ref 65–99)
Glucose-Capillary: 207 mg/dL — ABNORMAL HIGH (ref 65–99)

## 2017-08-17 LAB — BASIC METABOLIC PANEL
Anion gap: 13 (ref 5–15)
BUN: 82 mg/dL — AB (ref 6–20)
CALCIUM: 8.6 mg/dL — AB (ref 8.9–10.3)
CO2: 22 mmol/L (ref 22–32)
Chloride: 109 mmol/L (ref 101–111)
Creatinine, Ser: 3.86 mg/dL — ABNORMAL HIGH (ref 0.61–1.24)
GFR calc Af Amer: 15 mL/min — ABNORMAL LOW (ref >60)
GFR, EST NON AFRICAN AMERICAN: 13 mL/min — AB (ref >60)
Glucose, Bld: 305 mg/dL — ABNORMAL HIGH (ref 65–99)
POTASSIUM: 3.7 mmol/L (ref 3.5–5.1)
Sodium: 144 mmol/L (ref 135–145)

## 2017-08-17 LAB — CBC
HEMATOCRIT: 39 % (ref 39.0–52.0)
Hemoglobin: 13.1 g/dL (ref 13.0–17.0)
MCH: 31.6 pg (ref 26.0–34.0)
MCHC: 33.6 g/dL (ref 30.0–36.0)
MCV: 94 fL (ref 78.0–100.0)
Platelets: 209 10*3/uL (ref 150–400)
RBC: 4.15 MIL/uL — ABNORMAL LOW (ref 4.22–5.81)
RDW: 14.4 % (ref 11.5–15.5)
WBC: 10.5 10*3/uL (ref 4.0–10.5)

## 2017-08-17 LAB — BLOOD GAS, ARTERIAL
Acid-Base Excess: 0.6 mmol/L (ref 0.0–2.0)
Bicarbonate: 23.7 mmol/L (ref 20.0–28.0)
DRAWN BY: 28340
FIO2: 40
O2 Saturation: 94 %
PEEP: 5 cmH2O
Patient temperature: 98.6
RATE: 20 resp/min
VT: 590 mL
pCO2 arterial: 31.6 mmHg — ABNORMAL LOW (ref 32.0–48.0)
pH, Arterial: 7.487 — ABNORMAL HIGH (ref 7.350–7.450)
pO2, Arterial: 67.4 mmHg — ABNORMAL LOW (ref 83.0–108.0)

## 2017-08-17 LAB — PHOSPHORUS: PHOSPHORUS: 3.8 mg/dL (ref 2.5–4.6)

## 2017-08-17 LAB — MAGNESIUM: MAGNESIUM: 2.2 mg/dL (ref 1.7–2.4)

## 2017-08-17 MED ORDER — INSULIN GLARGINE 100 UNIT/ML ~~LOC~~ SOLN
5.0000 [IU] | Freq: Two times a day (BID) | SUBCUTANEOUS | Status: DC
Start: 2017-08-17 — End: 2017-08-18
  Administered 2017-08-17 (×2): 5 [IU] via SUBCUTANEOUS
  Filled 2017-08-17 (×4): qty 0.05

## 2017-08-17 MED ORDER — FUROSEMIDE 10 MG/ML IJ SOLN
60.0000 mg | Freq: Two times a day (BID) | INTRAMUSCULAR | Status: DC
  Administered 2017-08-17 – 2017-08-18 (×3): 60 mg via INTRAVENOUS
  Filled 2017-08-17 (×3): qty 6

## 2017-08-17 MED ORDER — FUROSEMIDE 10 MG/ML IJ SOLN
40.0000 mg | Freq: Two times a day (BID) | INTRAMUSCULAR | Status: DC
  Administered 2017-08-18: 20 mg via INTRAVENOUS

## 2017-08-17 MED ORDER — ARTIFICIAL TEARS OPHTHALMIC OINT
TOPICAL_OINTMENT | OPHTHALMIC | Status: DC | PRN
  Administered 2017-08-17: 16:00:00 via OPHTHALMIC
  Filled 2017-08-17: qty 3.5

## 2017-08-17 NOTE — Progress Notes (Signed)
Sedation discontinued this am. Tolerating CPAP well, volumes 300-400 RR increased at times. Patient  Mouthing words, water" . Explained about ETT> Thrashing head around occasionally. Restarted precedex at low dose and kept paitent on CPAP with 10 PS. RR in 20's. Still awake and responsive. Had a large amount of liquid stool MD aware, placed flexiseal. Has moisture associated skin redness around anal area and  Some above it. Moisture barrier applied. Family updated on condition via phone.

## 2017-08-17 NOTE — Progress Notes (Signed)
PULMONARY / CRITICAL CARE MEDICINE   Name: Malik Rojas MRN: [id number] DOB: 02-Apr-1932    ADMISSION DATE:  08/11/2017 CONSULTATION DATE:  08/11/17  REFERRING MD:  Jeanell Sparrow  CHIEF COMPLAINT:  Cardiac Arrest  HISTORY OF PRESENT ILLNESS:  84 y.o. male with PMH as outlined below. He presented to [facility] ED 1/7 after cardiac arrest at home.  Per his wife, he had SOB that morning but wanted to go and work on his car.  He then collapsed and was agonal.  By the time EMS arrived, he was pulseless.  CPR was performed for 37 minutes prior to ROSC.  There was questions whether he initially had PEA or V.fib. Per report, pt had been feeling bad for the past week with respiratory symptoms.  He apparently saw his PCP and was told that he had a respiratory infection.   Prior to arrest, no sudden worsening, no chest pain. No recent fevers/chills/sweats, N/V/D, abd pain, myalgias.  In ED, he remained unresponsive.  Vitals were stable.  He was seen by cardiology and was not felt to be a candidate for emergent cath lab.  Given that his arrest was witnessed, downtime of 37 minutes, continued unresponsive and non-purposeful state post ROSC, he will be started on targeted temperature management with goal 33 degrees.  Wife states that pt is full code; however, he would not want prolonged heroic measures.  SUBJECTIVE:    No events overnight, weaning on 10/5 but tired out yesterday  VITAL SIGNS: BP (!) 108/44   Pulse 62   Temp 99.2 F (37.3 C) (Oral)   Resp 20   Ht 5\' 10"  (1.778 m)   Wt 99.3 kg (218 lb 14.7 oz)   SpO2 92%   BMI 31.41 kg/m   HEMODYNAMICS:    VENTILATOR SETTINGS: Vent Mode: PSV;CPAP FiO2 (%):  [40 %] 40 % Set Rate:  [20 bmp] 20 bmp Vt Set:  [590 mL] 590 mL PEEP:  [5 cmH20] 5 cmH20 Pressure Support:  [10 cmH20] 10 cmH20 Plateau Pressure:  [17 cmH20-19 cmH20] 19 cmH20  INTAKE / OUTPUT: I/O last 3 completed shifts: In: 3457.4 [I.V.:460.4; NG/GT:2177; IV Piggyback:820] Out: 2595  [Urine:7260; Emesis/NG output:107]  PHYSICAL EXAMINATION: General: Obese male, off sedation, wakes up and following commands HEENT: [place]/AT, PERRL, EOM-I and MMM PSY: Not available Neuro: Awake and following commands CV: RRR, Nl S1/S2 and -M/R/G. PULM: Coarse BS bilaterally GI: Obese, soft, NT, ND and +BS Extremities: warm/dry, 2+ edema Skin: no rashes or lesions  LABS:  BMET Recent Labs  Lab 08/15/17 0329 08/16/17 0301 08/17/17 0250  NA 141 140 144  K 4.5 3.6 3.7  CL 110 107 109  CO2 21* 22 22  BUN 57* 62* 82*  CREATININE 4.01* 3.42* 3.86*  GLUCOSE 218* 212* 305*   Electrolytes Recent Labs  Lab 08/14/17 0412 08/15/17 0329 08/16/17 0301 08/17/17 0250  CALCIUM 7.8* 7.8* 8.4* 8.6*  MG 1.8  --  2.3 2.2  PHOS 5.3*  --  4.0 3.8   CBC Recent Labs  Lab 08/15/17 0329 08/16/17 0301 08/17/17 0250  WBC 11.6* 9.4 10.5  HGB 11.8* 14.1 13.1  HCT 37.5* 41.7 39.0  PLT 184 209 209   Coag's Recent Labs  Lab 08/11/17 1400 08/11/17 1808 08/11/17 2337  APTT  --  30 30  INR 1.16 1.17 1.11   Sepsis Markers Recent Labs  Lab 08/11/17 1355 08/11/17 1400  LATICACIDVEN 3.87* 3.4*   ABG Recent Labs  Lab 08/11/17 1824 08/16/17 0610 08/17/17 0348  PHART 7.470* 7.453* 7.487*  PCO2ART 29.9* 36.0 31.6*  PO2ART 86.0 68.1* 67.4*   Liver Enzymes Recent Labs  Lab 08/11/17 1400  AST 112*  ALT 63  ALKPHOS 76  BILITOT 1.0  ALBUMIN 3.0*   Cardiac Enzymes Recent Labs  Lab 08/11/17 2337 08/12/17 0352 08/12/17 0801  TROPONINI 0.45* 0.39* 0.31*   Glucose Recent Labs  Lab 08/16/17 1716 08/16/17 1955 08/17/17 0008 08/17/17 0332 08/17/17 0412 08/17/17 0825  GLUCAP 225* 226* 225* 272* 286* 209*   Imaging Dg Chest Port 1 View  Result Date: 08/17/2017 CLINICAL DATA:  ET tube EXAM: PORTABLE CHEST 1 VIEW COMPARISON:  08/16/2017 FINDINGS: Support devices are stable. Bilateral airspace opacities and layering effusions again noted. Cardiomegaly. No change since prior  study. IMPRESSION: Stable bilateral airspace disease and layering effusions. Findings likely reflect edema/CHF. Electronically Signed   By: Rolm Baptise M.D.   On: 08/17/2017 07:22    STUDIES:  Echo 1/7 > EF 25-30%, diffuse hypokinesis, severe LVH, mid MR EEG 1/7 > This EEG is markedly abnormal due to burst-suppression pattern.  Clinical Correlation of the above findings indicates severe diffuse cerebral dysfunction that is non-specific in etiology and can be seen in the setting of anoxic/ischemic injury, toxic/metabolic encephalopathies, or medication effect from Propofol and Fentanyl. There were no electrographic seizures in this study. Clinical correlation is advised. LE duplex 1/7 >  CT head 1/7 > no ICH  CULTURES: Blood 1/7 > 1/2 blood cultures positive>>coag neg staph Sputum 1/7 >   ANTIBIOTICS: 08/12/2017 vancomycin>>1/13 1/8 zosyn>>>1/13  SIGNIFICANT EVENTS: 1/7 > admit.  LINES/TUBES: ETT 1/7 >  R fem CVL 1/7 > 1/10 Art line  1/7 >   DISCUSSION: 84 y.o. male admitted 1/7 with cardiac arrest.  Likely respiratory leading to cardiac.  ASSESSMENT / PLAN:  PULMONARY A: Acute hypoxic respiratory failure - s/p cardiac arrest likely related at least in part to pulmonary edema.  P:   PS trials but no extubation given failure yesterday, will re-evaluate again today F/u CXR  F/u ABG VAP prevention measures. BLE Dopplers pending CXR daily Once extubated do not reintubate per family's wishes  CARDIOVASCULAR A:  Cardiac Arrest - presumed initial respiratory leading to cardiac. Hx HTN, HLD, CHB combined heart failure (Echo from April 2018 with EF 45 - 50%, G1DD), CAD. P:  S/p 33 degree TTM  Echo as above, severe LV dysfunction, EF 25-30% Cardiology following. Lasix 80 mg IV BID per cards Continue preadmission ASA, pravastatin, coreg. Continue to hold preadmission furosemide, imdur, lisinopril with labile BP.  RENAL AKI on CKD Lab Results  Component Value Date    CREATININE 3.86 (H) 08/17/2017   CREATININE 3.42 (H) 08/16/2017   CREATININE 4.01 (H) 08/15/2017   CREATININE 1.58 (H) 11/13/2016   CREATININE 1.65 (H) 07/23/2016   CREATININE 1.67 (H) 10/12/2015    P:   Correct electrolytes as needed Follow-up chemistry Hold preadmission lisinopril. Lasix 80 mg IV BID  GASTROINTESTINAL A:   GI prophylaxis. Nutrition. Increased OG outpt P:   SUP: Pantoprazole. NPO  Check KUB 08/14/2017 which was negative TF per nutrition  HEMATOLOGIC A:   VTE Prophylaxis. P:  SCD's / heparin. CBC as needed  INFECTIOUS A:   08/12/2017 noted to have 1 of 2 blood cultures positive for MRSA - ?contaminant  P:   Has indwelling AICD  D/C vanc/zosyn  Repeat BC 1/10  ENDOCRINE CBG (last 3)  Recent Labs    08/17/17 0332 08/17/17 0412 08/17/17 0825  GLUCAP 272* 286* 209*  A:   Hx DM. P:   ICU hyperglycemia protocol. Hold preadmission glargine.  NEUROLOGIC A:   At risk for anoxic encephalopathy. AMS  08/15/2013 making slow progress towards less confused P:   RASS goal = 0  PRN fentanyl  Hold off on neuro input for now with improving mental status  Avoid sedating medications as able   Family updated: No family bedside 1/13  Interdisciplinary Family Meeting v Palliative Care Meeting:  Due by: 08/17/17.  The patient is critically ill with multiple organ systems failure and requires high complexity decision making for assessment and support, frequent evaluation and titration of therapies, application of advanced monitoring technologies and extensive interpretation of multiple databases.   Critical Care Time devoted to patient care services described in this note is  35  Minutes. This time reflects time of care of this signee Dr Jennet Maduro. This critical care time does not reflect procedure time, or teaching time or supervisory time of PA/NP/Med student/Med Resident etc but could involve care discussion time.  Rush Farmer, M.D. [facility]  Pulmonary/Critical Care Medicine. Pager: [phone]. After hours pager: [phone].

## 2017-08-17 NOTE — Plan of Care (Signed)
On CPAP most of the day today. Having loose stools, flexiseal in. Patient alert mouthing words, anxious at times, reoriented throughout the day. Offered rest periods. Rest at night, work during day.  Plan to extubate in the next day or two,continue to diurese.

## 2017-08-17 NOTE — Progress Notes (Signed)
Patient ID: Malik Rojas, male   DOB: [birth year], 84 y.o.   MRN: [id number]     Advanced Heart Failure Rounding Note  PCP:  Primary Cardiologist: Caryl Comes  Subjective:    Good diuresis again yesterday with weight down.  Creatinine trend 4 => 3.4 => 3.86 (baseline around 1.6).  SBP fluctuates as high as 150s and as low as 100s.  He awakens and follows commands.   CXR with bilateral airspace disease, probable CHF.   Echo: severe LVH, EF 25-30%.    Objective:   Weight Range: 218 lb 14.7 oz (99.3 kg) Body mass index is 31.41 kg/m.   Vital Signs:   Temp:  [97.8 F (36.6 C)-99.5 F (37.5 C)] 99.2 F (37.3 C) (01/13 0800) Pulse Rate:  [60-75] 62 (01/13 0900) Resp:  [20] 20 (01/12 1629) BP: (85-183)/(44-111) 108/44 (01/13 0900) SpO2:  [88 %-99 %] 92 % (01/13 0900) FiO2 (%):  [40 %] 40 % (01/13 0900) Weight:  [218 lb 14.7 oz (99.3 kg)] 218 lb 14.7 oz (99.3 kg) (01/13 0400) Last BM Date: 08/16/17  Weight change: Filed Weights   08/13/17 0400 08/16/17 0347 08/17/17 0400  Weight: 226 lb 12.2 oz (102.9 kg) 223 lb 1.7 oz (101.2 kg) 218 lb 14.7 oz (99.3 kg)    Intake/Output:   Intake/Output Summary (Last 24 hours) at 08/17/2017 0942 Last data filed at 08/17/2017 0800 Gross per 24 hour  Intake 2327.86 ml  Output 3017 ml  Net -689.14 ml      Physical Exam    General: Intubated Neck: Thick, JVP difficult, no thyromegaly or thyroid nodule.  Lungs: Dependent crackles. CV: Nonpalpable PMI.  Heart regular S1/S2, no S3/S4, no murmur.  Trace ankle edema.   Abdomen: Soft, no hepatosplenomegaly, no distention.  Skin: Intact without lesions or rashes.  Neurologic: Sedated but awakens and follows commands.  Extremities: No clubbing or cyanosis.  HEENT: Normal.    Telemetry   A-pacing with RV pacing (personally reviewed)   Labs    CBC Recent Labs    08/16/17 0301 08/17/17 0250  WBC 9.4 10.5  HGB 14.1 13.1  HCT 41.7 39.0  MCV 92.9 94.0  PLT 209 373   Basic Metabolic  Panel Recent Labs    08/16/17 0301 08/17/17 0250  NA 140 144  K 3.6 3.7  CL 107 109  CO2 22 22  GLUCOSE 212* 305*  BUN 62* 82*  CREATININE 3.42* 3.86*  CALCIUM 8.4* 8.6*  MG 2.3 2.2  PHOS 4.0 3.8   Liver Function Tests No results for input(s): AST, ALT, ALKPHOS, BILITOT, PROT, ALBUMIN in the last 72 hours. No results for input(s): LIPASE, AMYLASE in the last 72 hours. Cardiac Enzymes No results for input(s): CKTOTAL, CKMB, CKMBINDEX, TROPONINI in the last 72 hours.  BNP: BNP (last 3 results) Recent Labs    11/06/16 1656  BNP 425.0*    ProBNP (last 3 results) No results for input(s): PROBNP in the last 8760 hours.   D-Dimer No results for input(s): DDIMER in the last 72 hours. Hemoglobin A1C No results for input(s): HGBA1C in the last 72 hours. Fasting Lipid Panel No results for input(s): CHOL, HDL, LDLCALC, TRIG, CHOLHDL, LDLDIRECT in the last 72 hours. Thyroid Function Tests No results for input(s): TSH, T4TOTAL, T3FREE, THYROIDAB in the last 72 hours.  Invalid input(s): FREET3  Other results:   Imaging    Dg Chest Port 1 View  Result Date: 08/17/2017 CLINICAL DATA:  ET tube EXAM: PORTABLE CHEST 1 VIEW COMPARISON:  08/16/2017 FINDINGS: Support devices are stable. Bilateral airspace opacities and layering effusions again noted. Cardiomegaly. No change since prior study. IMPRESSION: Stable bilateral airspace disease and layering effusions. Findings likely reflect edema/CHF. Electronically Signed   By: Rolm Baptise M.D.   On: 08/17/2017 07:22     Medications:     Scheduled Medications: . aspirin  81 mg Per Tube Daily  . carvedilol  25 mg Per Tube BID  . chlorhexidine gluconate (MEDLINE KIT)  15 mL Mouth Rinse BID  . cloNIDine  0.1 mg Transdermal Weekly  . feeding supplement (PRO-STAT SUGAR FREE 64)  30 mL Oral TID  . furosemide  40 mg Intravenous BID  . heparin injection (subcutaneous)  5,000 Units Subcutaneous Q8H  . hydrALAZINE  25 mg Oral Q8H  .  insulin aspart  2-6 Units Subcutaneous Q4H  . isosorbide dinitrate  10 mg Oral TID  . mouth rinse  15 mL Mouth Rinse 10 times per day  . pantoprazole sodium  40 mg Per Tube QHS  . pravastatin  80 mg Per Tube q1800    Infusions: . dexmedetomidine (PRECEDEX) IV infusion Stopped (08/17/17 0725)  . feeding supplement (VITAL HIGH PROTEIN) 1,000 mL (08/16/17 1312)  . fentaNYL infusion INTRAVENOUS Stopped (08/15/17 1600)  . norepinephrine (LEVOPHED) Adult infusion Stopped (08/14/17 9518)  . piperacillin-tazobactam (ZOSYN)  IV Stopped (08/17/17 8416)  . vancomycin Stopped (08/15/17 2223)    PRN Medications: albuterol, fentaNYL, hydrALAZINE, Influenza vac split quadrivalent PF, midazolam, midazolam, pneumococcal 23 valent vaccine    Patient Profile   83 yo with history of nonischemic cardiomyopathy, nonobstructive CAD, CHB s/p MDT PPM presented with cardiac arrest.   Assessment/Plan   1. Cardiac arrest: Prolonged CPR.  ?Initial rhythm (PEA versus VF).  He underwent hypothermia protocol.  This morning, he opens his eyes and follows commands.  2. Pulmonary: Intubated.  Pulmonary edema on CXR, also covering with vancomycin/Zosyn for PNA/aspiration.  - Can probably stop vancomycin today.  - Weaning towards extubation, seems to be neurologically intact.  3. Acute on chronic systolic CHF: NICM in past, EF lower now at 25-30%.  Pulmonary edema on CXR but has diuresed well with weight down x 2 days. Creatinine back up to 3.86 today.  Volume status difficult to assess on exam.  - With rise in creatinine but pulmonary edema still on CXR, will order lasix 60 mg IV bid to keep I/Os gently negative. Central access may be helpful to follow CVP, would have to be CVL as will not be able to get PICC with elevated creatinine.   - Continue current hydralazine/isordil.  - Continue Coreg 25 mg bid.  - If recovers, would likely benefit CRT with chronic RV pacing.  - Cannot rule out ischemic event, has known CAD  though nonobstructive in past.  Hold off on cath with elevated creatinine.  4. AKI on CKD stage 3: Creatinine trending back up.  4 => 3.42 => 3.86.  Baseline creatinine was 1.6.  5. HTN: BP fluctuating up and down.  Would not increase meds today.    Length of Stay: 6  Malik Champagne, MD  08/17/2017, 9:42 AM  Advanced Heart Failure Team Pager [phone] (M-F; 7a - 4p)  Please contact [facility] for night-coverage after hours (4p -7a ) and weekends on [email or web address]

## 2017-08-18 ENCOUNTER — Inpatient Hospital Stay (HOSPITAL_COMMUNITY): Payer: Medicare Other

## 2017-08-18 DIAGNOSIS — R609 Edema, unspecified: Secondary | ICD-10-CM

## 2017-08-18 DIAGNOSIS — N183 Chronic kidney disease, stage 3 (moderate): Secondary | ICD-10-CM

## 2017-08-18 DIAGNOSIS — N170 Acute kidney failure with tubular necrosis: Secondary | ICD-10-CM

## 2017-08-18 DIAGNOSIS — I110 Hypertensive heart disease with heart failure: Secondary | ICD-10-CM

## 2017-08-18 DIAGNOSIS — N179 Acute kidney failure, unspecified: Secondary | ICD-10-CM | POA: Diagnosis present

## 2017-08-18 LAB — BLOOD GAS, ARTERIAL
ACID-BASE EXCESS: 2.6 mmol/L — AB (ref 0.0–2.0)
Bicarbonate: 25.5 mmol/L (ref 20.0–28.0)
DRAWN BY: 28340
FIO2: 40
MECHVT: 590 mL
O2 Saturation: 94.3 %
PATIENT TEMPERATURE: 98.6
PCO2 ART: 32.2 mmHg (ref 32.0–48.0)
PEEP/CPAP: 5 cmH2O
PH ART: 7.51 — AB (ref 7.350–7.450)
PO2 ART: 67.3 mmHg — AB (ref 83.0–108.0)
RATE: 20 resp/min

## 2017-08-18 LAB — CBC
HCT: 42 % (ref 39.0–52.0)
Hemoglobin: 13.9 g/dL (ref 13.0–17.0)
MCH: 30.8 pg (ref 26.0–34.0)
MCHC: 33.1 g/dL (ref 30.0–36.0)
MCV: 92.9 fL (ref 78.0–100.0)
PLATELETS: 238 10*3/uL (ref 150–400)
RBC: 4.52 MIL/uL (ref 4.22–5.81)
RDW: 14.4 % (ref 11.5–15.5)
WBC: 10.5 10*3/uL (ref 4.0–10.5)

## 2017-08-18 LAB — BASIC METABOLIC PANEL
Anion gap: 15 (ref 5–15)
BUN: 105 mg/dL — AB (ref 6–20)
CHLORIDE: 107 mmol/L (ref 101–111)
CO2: 23 mmol/L (ref 22–32)
CREATININE: 3.79 mg/dL — AB (ref 0.61–1.24)
Calcium: 8.9 mg/dL (ref 8.9–10.3)
GFR calc Af Amer: 16 mL/min — ABNORMAL LOW (ref >60)
GFR calc non Af Amer: 13 mL/min — ABNORMAL LOW (ref >60)
Glucose, Bld: 276 mg/dL — ABNORMAL HIGH (ref 65–99)
Potassium: 3.4 mmol/L — ABNORMAL LOW (ref 3.5–5.1)
SODIUM: 145 mmol/L (ref 135–145)

## 2017-08-18 LAB — MAGNESIUM: MAGNESIUM: 2.2 mg/dL (ref 1.7–2.4)

## 2017-08-18 LAB — GLUCOSE, CAPILLARY
GLUCOSE-CAPILLARY: 263 mg/dL — AB (ref 65–99)
GLUCOSE-CAPILLARY: 285 mg/dL — AB (ref 65–99)
Glucose-Capillary: 226 mg/dL — ABNORMAL HIGH (ref 65–99)
Glucose-Capillary: 192 mg/dL — ABNORMAL HIGH (ref 65–99)
Glucose-Capillary: 129 mg/dL — ABNORMAL HIGH (ref 65–99)
Glucose-Capillary: 221 mg/dL — ABNORMAL HIGH (ref 65–99)

## 2017-08-18 LAB — PHOSPHORUS: Phosphorus: 4.1 mg/dL (ref 2.5–4.6)

## 2017-08-18 MED ORDER — POTASSIUM CHLORIDE 20 MEQ/15ML (10%) PO SOLN
40.0000 meq | Freq: Once | ORAL | Status: AC
  Administered 2017-08-18: 40 meq
  Filled 2017-08-18: qty 30

## 2017-08-18 MED ORDER — FUROSEMIDE 10 MG/ML IJ SOLN
INTRAMUSCULAR | Status: AC
  Administered 2017-08-18: 20 mg via INTRAVENOUS
  Filled 2017-08-18: qty 2

## 2017-08-18 MED ORDER — INSULIN GLARGINE 100 UNIT/ML ~~LOC~~ SOLN
10.0000 [IU] | Freq: Two times a day (BID) | SUBCUTANEOUS | Status: DC
  Administered 2017-08-18 – 2017-08-26 (×17): 10 [IU] via SUBCUTANEOUS
  Filled 2017-08-18 (×19): qty 0.1

## 2017-08-18 MED ORDER — FENTANYL CITRATE (PF) 100 MCG/2ML IJ SOLN
25.0000 ug | INTRAMUSCULAR | Status: DC | PRN
  Administered 2017-08-18: 50 ug via INTRAVENOUS
  Filled 2017-08-18: qty 2

## 2017-08-18 MED ORDER — FUROSEMIDE 10 MG/ML IJ SOLN: 40.0000 mg | Freq: Two times a day (BID) | INTRAMUSCULAR | Status: DC

## 2017-08-18 MED ORDER — HEPARIN (PORCINE) IN NACL 100-0.45 UNIT/ML-% IJ SOLN
1400.0000 [IU]/h | INTRAMUSCULAR | Status: DC
  Administered 2017-08-18: 1400 [IU]/h via INTRAVENOUS

## 2017-08-18 MED ORDER — ORAL CARE MOUTH RINSE
15.0000 mL | Freq: Two times a day (BID) | OROMUCOSAL | Status: DC
  Administered 2017-08-18 – 2017-08-26 (×18): 15 mL via OROMUCOSAL

## 2017-08-18 MED ORDER — CHLORHEXIDINE GLUCONATE 0.12 % MT SOLN
15.0000 mL | Freq: Two times a day (BID) | OROMUCOSAL | Status: DC
  Administered 2017-08-18 – 2017-08-20 (×5): 15 mL via OROMUCOSAL
  Filled 2017-08-18 (×3): qty 15

## 2017-08-18 MED ORDER — CLONIDINE HCL 0.2 MG/24HR TD PTWK: 0.2000 mg | MEDICATED_PATCH | TRANSDERMAL | Status: DC

## 2017-08-18 MED ORDER — HEPARIN (PORCINE) IN NACL 100-0.45 UNIT/ML-% IJ SOLN
INTRAMUSCULAR | Status: AC
  Filled 2017-08-18: qty 250

## 2017-08-18 MED ORDER — PANTOPRAZOLE SODIUM 40 MG PO TBEC
40.0000 mg | DELAYED_RELEASE_TABLET | Freq: Every day | ORAL | Status: DC
  Administered 2017-08-18 – 2017-08-25 (×8): 40 mg via ORAL
  Filled 2017-08-18 (×8): qty 1

## 2017-08-18 MED ORDER — FUROSEMIDE 10 MG/ML IJ SOLN
80.0000 mg | Freq: Four times a day (QID) | INTRAMUSCULAR | Status: DC
  Administered 2017-08-18 – 2017-08-19 (×3): 80 mg via INTRAVENOUS
  Filled 2017-08-18 (×3): qty 8

## 2017-08-18 NOTE — Progress Notes (Signed)
ANTICOAGULATION CONSULT NOTE - Initial Consult  Pharmacy Consult for Heparin Indication: DVT  No Known Allergies  Patient Measurements: Height: 5' 10"  (177.8 cm) Weight: 218 lb 14.7 oz (99.3 kg) IBW/kg (Calculated) : 73  Vital Signs: Temp: 98 F (36.7 C) (01/14 1546) Temp Source: Axillary (01/14 1546) BP: 174/88 (01/14 1546) Pulse Rate: 66 (01/14 1546)  Labs: Recent Labs    08/16/17 0301 08/17/17 0250 08/18/17 0306  HGB 14.1 13.1 13.9  HCT 41.7 39.0 42.0  PLT 209 209 238  CREATININE 3.42* 3.86* 3.79*    Estimated Creatinine Clearance: 17.4 mL/min (A) (by C-G formula based on SCr of 3.79 mg/dL (H)).   Medical History: Past Medical History:  Diagnosis Date  . Basal cell carcinoma    "scalp, arms, right face;  some cut, some burndt off"  . CAD (coronary artery disease)    LHC 3/14: Distal left main 10%, LAD 20%, circumflex 10-20%, proximal RCA 20%, distal 40-50%, EF 50-55%.  . Chronic combined systolic and diastolic CHF, NYHA class 4 ([place]) 11/06/2016   Echo 4/18: severe LVH, EF 45-50, global HK, Gr 1 DD, mild AS (mean 9/peak 17), MAC, mod LAE, trivial eff  . Complete heart block (HCC)    Status post Medtronic pacemaker  . High cholesterol   . HTN (hypertension)   . Hx of cardiovascular stress test    a. [place] MV 3/14:  Low risk, EF 36%, apical inf scar, no ischemia, inf-apical HK>> catheterization however demonstrated normal left ventricular function  . Melanoma ([place])    back  . Obesity   . Pneumonia 1940  . Presence of permanent cardiac pacemaker    Medtronic  . Type II diabetes mellitus ([place])   . Urinary frequency   . Urinary incontinence    Assessment: 84 year old male s/p prolonged CPR with CHF, respiratory failure, AKI to begin heparin for new DVT Last dose of sq heparin at 3 pm  Goal of Therapy:  Heparin level 0.3-0.7 units/ml Monitor platelets by anticoagulation protocol: Yes   Plan:  No heparin bolus per MD (and sq heparin dose at 1500  pm) Begin heparin drip at 1400 units / hr Heparin level and CBC 8 hours after heparin starts Daily heparin level, CBC  Thank you Anette Guarneri, PharmD [phone]  Malik Rojas 08/18/2017,5:34 PM

## 2017-08-18 NOTE — Progress Notes (Signed)
Pt extubated to 4L [place]. Pt tol well. Orders for BIPAP are present as needed. Positive cuff leak present prior to extubation. No stridor. Pt able to vocalize at a whisper. No distress noted. Will cont to monitor

## 2017-08-18 NOTE — Progress Notes (Signed)
LE venous duplex prelim: DVT noted in the right common femoral vein, extending into Distal iliac vein. Mid right iliac vein appears patent, but very difficult to visualize more proximal due to patient size.  No DVT LLE.  Landry Mellow, RDMS, RVT Gave results to nurse.

## 2017-08-18 NOTE — Progress Notes (Signed)
Progress Note  Patient Name: Malik Rojas Date of Encounter: 08/18/2017  Primary Cardiologist: Dr. Caryl Comes   Subjective   Pt remains intubated, but weaning. He responds to simple commands. Per family, once he is extubated he does not want to be re-intubated. PCCM to increase his lasix today in the setting of increased pulmonary edema on CXR.   Inpatient Medications    Scheduled Meds: . aspirin  81 mg Per Tube Daily  . carvedilol  25 mg Per Tube BID  . chlorhexidine gluconate (MEDLINE KIT)  15 mL Mouth Rinse BID  . [START ON 08/22/2017] cloNIDine  0.2 mg Transdermal Weekly  . feeding supplement (PRO-STAT SUGAR FREE 64)  30 mL Oral TID  . furosemide  60 mg Intravenous BID  . heparin injection (subcutaneous)  5,000 Units Subcutaneous Q8H  . hydrALAZINE  25 mg Oral Q8H  . insulin aspart  2-6 Units Subcutaneous Q4H  . insulin glargine  10 Units Subcutaneous BID  . isosorbide dinitrate  10 mg Oral TID  . mouth rinse  15 mL Mouth Rinse 10 times per day  . pantoprazole sodium  40 mg Per Tube QHS  . pravastatin  80 mg Per Tube q1800   Continuous Infusions: . dexmedetomidine (PRECEDEX) IV infusion Stopped (08/18/17 0900)  . feeding supplement (VITAL HIGH PROTEIN) 1,000 mL (08/18/17 0608)   PRN Meds: albuterol, artificial tears, fentaNYL (SUBLIMAZE) injection, hydrALAZINE, Influenza vac split quadrivalent PF, pneumococcal 23 valent vaccine   Vital Signs    Vitals:   08/18/17 0737 08/18/17 0800 08/18/17 0900 08/18/17 1000  BP: (!) 175/60 (!) 168/65 (!) 183/76 (!) 100/54  Pulse: 61 60 62 (!) 59  Resp: 20     Temp:      TempSrc:      SpO2: 93% 94% 92% 91%  Weight:      Height:        Intake/Output Summary (Last 24 hours) at 08/18/2017 1032 Last data filed at 08/18/2017 1000 Gross per 24 hour  Intake 1454.03 ml  Output 4590 ml  Net -3135.97 ml   Filed Weights   08/13/17 0400 08/16/17 0347 08/17/17 0400  Weight: 226 lb 12.2 oz (102.9 kg) 223 lb 1.7 oz (101.2 kg) 218  lb 14.7 oz (99.3 kg)    Physical Exam   General: Frail, elderly, NAD Skin: Warm, dry, intact  Head: Normocephalic, atraumatic, clear, moist mucus membranes. Neck: Negative for carotid bruits. No JVD Lungs: Rhonchi in bilateral lung fields.No wheezes. Breathing is unlabored. ETT, to be extubated to [place] later today.  Cardiovascular: RRR with S1 S2. No murmurs, rubs, or gallops Abdomen: Soft, non-tender, non-distended with normoactive bowel sounds.No obvious abdominal masses. MSK: Strength and tone appear normal for age. 5/5 in all extremities Extremities: Mild LE edema. No clubbing or cyanosis. DP/PT pulses 1+ bilaterally Neuro: Intubated, sleepy, responds to simple commands. No focal deficits. No facial asymmetry. MAE spontaneously. Psych: Intubated, responds to simple commands. Family at bedside.   Labs    Chemistry Recent Labs  Lab 08/11/17 1400  08/16/17 0301 08/17/17 0250 08/18/17 0306  NA 138   < > 140 144 145  K 4.6   < > 3.6 3.7 3.4*  CL 106   < > 107 109 107  CO2 25   < > '22 22 23  '$ GLUCOSE 259*   < > 212* 305* 276*  BUN 27*   < > 62* 82* 105*  CREATININE 1.77*   < > 3.42* 3.86* 3.79*  CALCIUM  7.8*   < > 8.4* 8.6* 8.9  PROT 6.0*  --   --   --   --   ALBUMIN 3.0*  --   --   --   --   AST 112*  --   --   --   --   ALT 63  --   --   --   --   ALKPHOS 76  --   --   --   --   BILITOT 1.0  --   --   --   --   GFRNONAA 34*   < > 15* 13* 13*  GFRAA 39*   < > 18* 15* 16*  ANIONGAP 7   < > '11 13 15   '$ < > = values in this interval not displayed.     Hematology Recent Labs  Lab 08/16/17 0301 08/17/17 0250 08/18/17 0306  WBC 9.4 10.5 10.5  RBC 4.49 4.15* 4.52  HGB 14.1 13.1 13.9  HCT 41.7 39.0 42.0  MCV 92.9 94.0 92.9  MCH 31.4 31.6 30.8  MCHC 33.8 33.6 33.1  RDW 14.2 14.4 14.4  PLT 209 209 238    Cardiac Enzymes Recent Labs  Lab 08/11/17 1808 08/11/17 2337 08/12/17 0352 08/12/17 0801  TROPONINI 0.46* 0.45* 0.39* 0.31*    Recent Labs  Lab  08/11/17 1352  TROPIPOC 0.10*     BNPNo results for input(s): BNP, PROBNP in the last 168 hours.   DDimer No results for input(s): DDIMER in the last 168 hours.   Radiology    Dg Chest Port 1 View  Result Date: 08/18/2017 CLINICAL DATA:  Intubated patient, cardiac arrest, CHF respiratory failure, diabetes, former smoker. EXAM: PORTABLE CHEST 1 VIEW COMPARISON:  Portable chest x-ray of August 17, 2017 FINDINGS: The lungs are reasonably well inflated. There are bilateral pleural effusions layering posteriorly. The interstitial markings are slightly less conspicuous bilaterally today. The cardiac silhouette remains enlarged. The central pulmonary vascularity is less engorged and more distinct. The ICD is in stable position. There calcification in the wall of the thoracic aorta. The endotracheal tube tip projects approximately 3.4 cm above the carina. The esophagogastric tube tip and proximal port project below the inferior margin of the image. IMPRESSION: CHF with slight interval decrease in interstitial edema. Persistent bibasilar pleural effusions layering posteriorly. Thoracic aortic atherosclerosis. The support tubes are in reasonable position.  Assess Electronically Signed   By: Christabell Loseke  [place] M.D.   On: 08/18/2017 07:36   Dg Chest Port 1 View  Result Date: 08/17/2017 CLINICAL DATA:  ET tube EXAM: PORTABLE CHEST 1 VIEW COMPARISON:  08/16/2017 FINDINGS: Support devices are stable. Bilateral airspace opacities and layering effusions again noted. Cardiomegaly. No change since prior study. IMPRESSION: Stable bilateral airspace disease and layering effusions. Findings likely reflect edema/CHF. Electronically Signed   By: Rolm Baptise M.D.   On: 08/17/2017 07:22    Telemetry    08/18/17 NSR HR 62- Personally Reviewed  ECG    08/12/17 AV paced HR 60 - Personally Reviewed  Cardiac Studies   Echo 08/12/17:  Study Conclusions  - Left ventricle: Diffuse hypokinesis mid and basal akinesis  The   cavity size was moderately dilated. Wall thickness was increased   in a pattern of severe LVH. Systolic function was severely   reduced. The estimated ejection fraction was in the range of 25%   to 30%. Diffuse hypokinesis. - Aortic valve: There was mild regurgitation. - Mitral valve: Severely calcified annulus. Moderately thickened  leaflets . There was mild regurgitation. - Left atrium: The atrium was mildly dilated.  Patient Profile     84 y.o. male with PMH of NICM, CHB s/p PPM, non-obstructive CAD per cath report 2014, HTN, DM, HL who presented with sudden cardiac arrest on 08/11/17. EF found to be 25-30% with diffuse hypokinesis.   Assessment & Plan    1. Cardiac arrest: -Prolonged CPR, initial rhythm PEA vs VF>>> hypothermia protocol. More awake this AM. Precedex off. PCCM at bedside for potential extubation to Bipap today, however CXR appears to have severe pulmonary edema. To increase Lasix today to '80mg'$  IV Q6H  2. Acute on chronic systolic CHF:  -NICM with decreased EF 25-30%  -Pulmonary edema on CXR, PCCM at bedside with discussion on extubation and lasix therapy. Clonidine and hydralazine on hold to give BP room in the setting of increased Lasix today. CXR with increased pulmonary edema  -PCCM to increase lasix this AM to '80mg'$  IV Q6H -Cr slightly elevated, but stable -Weight, 218lb, down 5 lbs since yesterday (admission 231lb) -I&O, negative 550m today and net negative 11L since admission   -On BB  -Consider CRT with chronic RV pacing (per MD note)  3. Respiratory failure: -Per PCCM -Potential extubation to Bipap today  -Increasing lasix  today in hopes to [place]ubate successfully to [place] He does not want to be re-intubated in the event that this fails. He needs to be at optimal level before extubation.   4. AKI stage III -Cr, 3.79>3.86>3.42>4.0>3.55 -PCCM to increase lasix to '80mg'$  IV Q6H  5. HTN: -Elevated, 183/76>168/65>175/60>172/151 -Plan is to hold  hydralazine and clonidine patch now for greater BP range in the setting of increasing his Lasix to '80mg'$  IV Q6H r/t increasing pulmonary edema on CXR -Will monitor  Signed, JKathyrn Drown NP   08/18/2017, 10:32 AM   Pager: [phone] For questions or updates, please contact   Please consult [email or web address] for contact info under Cardiology/STEMI.  I have seen, examined and evaluated the patient this PM after JKathyrn Drown NP  .  After reviewing all the available data and chart, we discussed the patients laboratory, study & physical findings as well as symptoms in detail. I agree with her findings, examination as well as impression recommendations as per our discussion.     I actually briefly saw him this morning prior to extubation and then went back to see him again after extubation for better evaluation.  He is awake and alert.  He is answering questions, albeit very slowly.  Seems to be relatively comfortable with no significant dyspnea.  He has had brisk urine output with increased dose of IV Lasix [facility] to 80 mg IV every 6 hours from 60 mg IV twice daily). --Can probably continue with current treatment at least through tomorrow and reassess.  The plan had been to hold hydralazine and clonidine to allow for better blood pressure, however he is now become hypertensive.  He remains now on clonidine patch and hydralazine.  I would prefer to use hydralazine over increasing clonidine patch to allow for better afterload reduction and less lability of pressures.  We need to closely follow renal function /creatinine as well as electrolytes with aggressive diuresis.  [facility] potassium.  He has ischemic cardiomyopathy, and now with likely reduced EF.  On stable dose of carvedilol.  Using hydralazine/nitrate for reduction given renal insufficiency/ACE inhibitor).  Agree that to him once he is stabilized, CRT-D therapy from EP. --Will notify Dr.  Caryl Comes that the patient is  here.     Glenetta Hew, M.D., M.S. Interventional Cardiologist   Pager # [phone] Phone # [phone] [address] [place], [place] [address]

## 2017-08-18 NOTE — Progress Notes (Signed)
eLink Physician-Brief Progress Note Patient Name: Malik Rojas DOB: Feb 02, 1932 MRN: [id number]   Date of Service  08/18/2017  HPI/Events of Note  Agitation - Patient picking at sheets and is restless.   eICU Interventions  Will order: 1. Precedex low dose IV infusion (0.2 to 0.5 mcg/kg/hour). Titrate to RASS = 0.      Intervention Category Minor Interventions: Agitation / anxiety - evaluation and management  Lysle Dingwall 08/18/2017, 10:21 PM

## 2017-08-18 NOTE — Plan of Care (Signed)
Patient has been following commands and responsive all night.  He is anxious to be extubated and out of bed.  His vitals have been stable but has been hypertensive.  Has to give prn dose of versed due to anxiousness.

## 2017-08-18 NOTE — Progress Notes (Signed)
RT attempted to wean patient x's 2. Each time patient did not breathe, vent went into back uo ventilation each time. Pt was awake, trying to communicate and reached for ET tube. RT explained to patient that it was very important to not touch the tube. Pt nodded in understanding.

## 2017-08-18 NOTE — Progress Notes (Signed)
PULMONARY / CRITICAL CARE MEDICINE   Name: Malik Rojas MRN: [id number] DOB: 1931-09-21    ADMISSION DATE:  08/11/2017 CONSULTATION DATE:  08/11/17  REFERRING MD:  Jeanell Sparrow  CHIEF COMPLAINT:  Cardiac Arrest  HISTORY OF PRESENT ILLNESS:  84 y.o. male with PMH as outlined below. He presented to [facility] ED 1/7 after cardiac arrest at home.  Per his wife, he had SOB that morning but wanted to go and work on his car.  He then collapsed and was agonal.  By the time EMS arrived, he was pulseless.  CPR was performed for 37 minutes prior to ROSC.  There was questions whether he initially had PEA or V.fib. Per report, pt had been feeling bad for the past week with respiratory symptoms.  He apparently saw his PCP and was told that he had a respiratory infection.   Prior to arrest, no sudden worsening, no chest pain. No recent fevers/chills/sweats, N/V/D, abd pain, myalgias.  In ED, he remained unresponsive.  Vitals were stable.  He was seen by cardiology and was not felt to be a candidate for emergent cath lab.  Given that his arrest was witnessed, downtime of 37 minutes, continued unresponsive and non-purposeful state post ROSC, he will be started on targeted temperature management with goal 33 degrees.  Wife states that pt is full code; however, he would not want prolonged heroic measures.  SUBJECTIVE:    No change overnight.  Weaned PS yesterday x 6-8 hrs.  Mild intermittent agitation, remains on precedex.   VITAL SIGNS: BP (!) 175/60   Pulse 61   Temp (!) 97.5 F (36.4 C) (Axillary)   Resp 20   Ht 5\' 10"  (1.778 m)   Wt 99.3 kg (218 lb 14.7 oz)   SpO2 93%   BMI 31.41 kg/m   HEMODYNAMICS:    VENTILATOR SETTINGS: Vent Mode: PRVC FiO2 (%):  [40 %-60 %] 40 % Set Rate:  [20 bmp] 20 bmp Vt Set:  [590 mL] 590 mL PEEP:  [5 cmH20] 5 cmH20 Pressure Support:  [10 cmH20] 10 cmH20 Plateau Pressure:  [17 cmH20-19 cmH20] 17 cmH20  INTAKE / OUTPUT: I/O last 3 completed shifts: In: 2953.4  [I.V.:265.9; Other:100; NG/GT:2377.5; IV Piggyback:210] Out: 6302 Demetris.Buggy; Emesis/NG output:457; Stool:200]  PHYSICAL EXAMINATION: General: elderly male, NAD on vent  HEENT: ETT, [place]/AT, PERRL, EOM-I and MMM Neuro: awake, alert, on low dose precedex, follows commands, nods appropriately, MAE  CV: RRR, Nl S1/S2 and -M/R/G. PULM: resps even non labored on vent, tol PS 10/5, coarse BS bilaterally GI: Obese, soft, NT, ND and +BS Extremities: warm/dry, 2+ edema Skin: no rashes or lesions  LABS:  BMET Recent Labs  Lab 08/16/17 0301 08/17/17 0250 08/18/17 0306  NA 140 144 145  K 3.6 3.7 3.4*  CL 107 109 107  CO2 22 22 23   BUN 62* 82* 105*  CREATININE 3.42* 3.86* 3.79*  GLUCOSE 212* 305* 276*   Electrolytes Recent Labs  Lab 08/16/17 0301 08/17/17 0250 08/18/17 0306  CALCIUM 8.4* 8.6* 8.9  MG 2.3 2.2 2.2  PHOS 4.0 3.8 4.1   CBC Recent Labs  Lab 08/16/17 0301 08/17/17 0250 08/18/17 0306  WBC 9.4 10.5 10.5  HGB 14.1 13.1 13.9  HCT 41.7 39.0 42.0  PLT 209 209 238   Coag's Recent Labs  Lab 08/11/17 1400 08/11/17 1808 08/11/17 2337  APTT  --  30 30  INR 1.16 1.17 1.11   Sepsis Markers Recent Labs  Lab 08/11/17 1355 08/11/17 1400  LATICACIDVEN  3.87* 3.4*   ABG Recent Labs  Lab 08/16/17 0610 08/17/17 0348 08/18/17 0410  PHART 7.453* 7.487* 7.510*  PCO2ART 36.0 31.6* 32.2  PO2ART 68.1* 67.4* 67.3*   Liver Enzymes Recent Labs  Lab 08/11/17 1400  AST 112*  ALT 63  ALKPHOS 76  BILITOT 1.0  ALBUMIN 3.0*   Cardiac Enzymes Recent Labs  Lab 08/11/17 2337 08/12/17 0352 08/12/17 0801  TROPONINI 0.45* 0.39* 0.31*   Glucose Recent Labs  Lab 08/17/17 1154 08/17/17 1615 08/17/17 1959 08/17/17 2354 08/18/17 0350 08/18/17 0601  GLUCAP 209* 189* 207* 277* 263* 285*   Imaging Dg Chest Port 1 View  Result Date: 08/18/2017 CLINICAL DATA:  Intubated patient, cardiac arrest, CHF respiratory failure, diabetes, former smoker. EXAM: PORTABLE CHEST  1 VIEW COMPARISON:  Portable chest x-ray of August 17, 2017 FINDINGS: The lungs are reasonably well inflated. There are bilateral pleural effusions layering posteriorly. The interstitial markings are slightly less conspicuous bilaterally today. The cardiac silhouette remains enlarged. The central pulmonary vascularity is less engorged and more distinct. The ICD is in stable position. There calcification in the wall of the thoracic aorta. The endotracheal tube tip projects approximately 3.4 cm above the carina. The esophagogastric tube tip and proximal port project below the inferior margin of the image. IMPRESSION: CHF with slight interval decrease in interstitial edema. Persistent bibasilar pleural effusions layering posteriorly. Thoracic aortic atherosclerosis. The support tubes are in reasonable position.  Assess Electronically Signed   By: David  Martinique M.D.   On: 08/18/2017 07:36    STUDIES:  Echo 1/7 > EF 25-30%, diffuse hypokinesis, severe LVH, mid MR EEG 1/7 > This EEG is markedly abnormal due to burst-suppression pattern.  Clinical Correlation of the above findings indicates severe diffuse cerebral dysfunction that is non-specific in etiology and can be seen in the setting of anoxic/ischemic injury, toxic/metabolic encephalopathies, or medication effect from Propofol and Fentanyl. There were no electrographic seizures in this study. Clinical correlation is advised. LE duplex 1/14 >  CT head 1/7 > no ICH  CULTURES: Blood 1/7 > 1/2 blood cultures positive>>coag neg staph Sputum 1/7 > cancelled  BC x 2 1/10>>>  ANTIBIOTICS: 08/12/2017 vancomycin>>1/13 1/8 zosyn>>>1/13  SIGNIFICANT EVENTS: 1/7 > admit.  LINES/TUBES: ETT 1/7 >  R fem CVL 1/7 > 1/10 Art line  1/7 > out  DISCUSSION: 84 y.o. male admitted 1/7 with cardiac arrest.  Likely respiratory leading to cardiac.  ASSESSMENT / PLAN:  PULMONARY A: Acute hypoxic respiratory failure - s/p cardiac arrest likely related at least  in part to pulmonary edema.  P:   Vent support - 8cc/kg  F/u CXR  F/u ABG PS wean as tol  May be extubateable today - will re-assess once precedex off for a bit  VAP prevention measures. BLE Dopplers pending -- ??not done yet, will reorder 1/14 CXR daily Once extubated do not reintubate per family's wishes  CARDIOVASCULAR A:  Cardiac Arrest - presumed initial respiratory leading to cardiac. Hx HTN, HLD, CHB combined heart failure (Echo from April 2018 with EF 45 - 50%, G1DD), CAD. P:  S/p 33 degree TTM  Echo as above, severe LV dysfunction, EF 25-30% Cardiology following. Continue Lasix 60 mg IV BID per cards Continue preadmission ASA, pravastatin, coreg. Increase clonidine patch  Continue coreg, isordil, hydralazine for HTN  RENAL AKI on CKD Hypokalemia  P:   Correct electrolytes as needed Follow-up chemistry Lasix 60 mg IV BID Replete K PRN   GASTROINTESTINAL A:   GI prophylaxis. Nutrition. Increased  OG outpt - resolved.  P:   SUP: Pantoprazole. NPO  TF per nutrition  HEMATOLOGIC A:   VTE Prophylaxis. P:  SCD's / heparin. CBC as needed  INFECTIOUS A:   08/12/2017 noted to have 1 of 2 blood cultures positive for MRSA - ?contaminant  P:   Has indwelling AICD  Monitor off abx  Repeat BC 1/10 - ngtd>>>  ENDOCRINE A:   Hx DM. P:   SSI  Increase lantus to 10 units BID  May need to hold lantus if extubated and TF off   NEUROLOGIC A:   At risk for anoxic encephalopathy. AMS - improving  08/15/2013 making slow progress towards less confused P:   RASS goal = 0  PRN fentanyl  Hold off on neuro input for now with improving mental status  Avoid sedating medications as able  Hold precedex this am and assess for extubation   Family updated: No family bedside 1/14    Nickolas Madrid, NP 08/18/2017  9:19 AM Pager: [phone] or [phone]

## 2017-08-19 ENCOUNTER — Inpatient Hospital Stay (HOSPITAL_COMMUNITY): Payer: Medicare Other

## 2017-08-19 DIAGNOSIS — J9690 Respiratory failure, unspecified, unspecified whether with hypoxia or hypercapnia: Secondary | ICD-10-CM

## 2017-08-19 DIAGNOSIS — J9601 Acute respiratory failure with hypoxia: Secondary | ICD-10-CM

## 2017-08-19 LAB — GLUCOSE, CAPILLARY
GLUCOSE-CAPILLARY: 144 mg/dL — AB (ref 65–99)
GLUCOSE-CAPILLARY: 159 mg/dL — AB (ref 65–99)
GLUCOSE-CAPILLARY: 248 mg/dL — AB (ref 65–99)
Glucose-Capillary: 168 mg/dL — ABNORMAL HIGH (ref 65–99)
Glucose-Capillary: 134 mg/dL — ABNORMAL HIGH (ref 65–99)
Glucose-Capillary: 228 mg/dL — ABNORMAL HIGH (ref 65–99)

## 2017-08-19 LAB — CULTURE, BLOOD (ROUTINE X 2)
Culture: NO GROWTH
Culture: NO GROWTH
SPECIAL REQUESTS: ADEQUATE
Special Requests: ADEQUATE

## 2017-08-19 LAB — BASIC METABOLIC PANEL
Anion gap: 15 (ref 5–15)
BUN: 121 mg/dL — AB (ref 6–20)
CALCIUM: 8.7 mg/dL — AB (ref 8.9–10.3)
CO2: 26 mmol/L (ref 22–32)
CREATININE: 4.37 mg/dL — AB (ref 0.61–1.24)
Chloride: 107 mmol/L (ref 101–111)
GFR calc Af Amer: 13 mL/min — ABNORMAL LOW (ref >60)
GFR, EST NON AFRICAN AMERICAN: 11 mL/min — AB (ref >60)
Glucose, Bld: 152 mg/dL — ABNORMAL HIGH (ref 65–99)
Potassium: 4.4 mmol/L (ref 3.5–5.1)
SODIUM: 148 mmol/L — AB (ref 135–145)

## 2017-08-19 LAB — CBC WITH DIFFERENTIAL/PLATELET
Basophils Absolute: 0 10*3/uL (ref 0.0–0.1)
Basophils Relative: 0 %
EOS ABS: 0.1 10*3/uL (ref 0.0–0.7)
Eosinophils Relative: 1 %
HCT: 41.6 % (ref 39.0–52.0)
Hemoglobin: 13.5 g/dL (ref 13.0–17.0)
LYMPHS ABS: 2.3 10*3/uL (ref 0.7–4.0)
Lymphocytes Relative: 16 %
MCH: 31 pg (ref 26.0–34.0)
MCHC: 32.5 g/dL (ref 30.0–36.0)
MCV: 95.4 fL (ref 78.0–100.0)
MONO ABS: 0.9 10*3/uL (ref 0.1–1.0)
Monocytes Relative: 6 %
NEUTROS PCT: 77 %
Neutro Abs: 11.2 10*3/uL — ABNORMAL HIGH (ref 1.7–7.7)
PLATELETS: 301 10*3/uL (ref 150–400)
RBC: 4.36 MIL/uL (ref 4.22–5.81)
RDW: 14.9 % (ref 11.5–15.5)
WBC: 14.5 10*3/uL — AB (ref 4.0–10.5)

## 2017-08-19 LAB — MAGNESIUM: Magnesium: 2.3 mg/dL (ref 1.7–2.4)

## 2017-08-19 LAB — HEPARIN LEVEL (UNFRACTIONATED)
HEPARIN UNFRACTIONATED: 1.03 [IU]/mL — AB (ref 0.30–0.70)
HEPARIN UNFRACTIONATED: 0.33 [IU]/mL (ref 0.30–0.70)
Heparin Unfractionated: 0.88 IU/mL — ABNORMAL HIGH (ref 0.30–0.70)

## 2017-08-19 LAB — PHOSPHORUS: PHOSPHORUS: 6.6 mg/dL — AB (ref 2.5–4.6)

## 2017-08-19 MED ORDER — WARFARIN SODIUM 5 MG PO TABS
5.0000 mg | ORAL_TABLET | Freq: Once | ORAL | Status: AC
Start: 2017-08-19 — End: 2017-08-19
  Administered 2017-08-19: 5 mg via ORAL
  Filled 2017-08-19: qty 1

## 2017-08-19 MED ORDER — RESOURCE THICKENUP CLEAR PO POWD
ORAL | Status: DC | PRN
  Filled 2017-08-19: qty 125

## 2017-08-19 MED ORDER — HEPARIN (PORCINE) IN NACL 100-0.45 UNIT/ML-% IJ SOLN
1250.0000 [IU]/h | INTRAMUSCULAR | Status: DC
  Administered 2017-08-21 – 2017-08-24 (×4): 1150 [IU]/h via INTRAVENOUS
  Filled 2017-08-19 (×7): qty 250

## 2017-08-19 MED ORDER — WARFARIN - PHARMACIST DOSING INPATIENT
Freq: Every day | Status: DC
  Administered 2017-08-19 – 2017-08-22 (×4)
  Administered 2017-08-23 – 2017-08-24 (×2): 1
  Administered 2017-08-26: 18:00:00

## 2017-08-19 NOTE — Progress Notes (Signed)
ANTICOAGULATION CONSULT NOTE - Consult  Pharmacy Consult for Heparin Indication: DVT  No Known Allergies  Patient Measurements: Height: _0  (177.8 cm) Weight: 218 lb 14.7 oz (99.3 kg) IBW/kg (Calculated) : 73  Vital Signs: Temp: 98 F (36.7 C) (01/14 2300) Temp Source: Oral (01/14 2300) BP: 112/52 (01/14 2100) Pulse Rate: 92 (01/14 2000)  Labs: Recent Labs    08/17/17 0250 08/18/17 0306 08/19/17 0149  HGB 13.1 13.9 13.5  HCT 39.0 42.0 41.6  PLT 209 238 301  HEPARINUNFRC  --   --  1.03*  CREATININE 3.86* 3.79* 4.37*   Estimated Creatinine Clearance: 15.1 mL/min (A) (by C-G formula based on SCr of 4.37 mg/dL (H)).  Medical History: Past Medical History:  Diagnosis Date  . Basal cell carcinoma    "scalp, arms, right face;  some cut, some burndt off"  . CAD (coronary artery disease)    LHC 3/14: Distal left main 10%, LAD 20%, circumflex 10-20%, proximal RCA 20%, distal 40-50%, EF 50-55%.  . Chronic combined systolic and diastolic CHF, NYHA class 4 ([place]) 11/06/2016   Echo 4/18: severe LVH, EF 45-50, global HK, Gr 1 DD, mild AS (mean 9/peak 17), MAC, mod LAE, trivial eff  . Complete heart block (HCC)    Status post Medtronic pacemaker  . High cholesterol   . HTN (hypertension)   . Hx of cardiovascular stress test    a. [place] MV 3/14:  Low risk, EF 36%, apical inf scar, no ischemia, inf-apical HK>> catheterization however demonstrated normal left ventricular function  . Melanoma ([place])    back  . Obesity   . Pneumonia 1940  . Presence of permanent cardiac pacemaker    Medtronic  . Type II diabetes mellitus ([place])   . Urinary frequency   . Urinary incontinence    Assessment: 84 year old male s/p prolonged CPR with CHF, respiratory failure, AKI to begin heparin for new DVT, Last dose of sq heparin at 3 pm  Initial heparin level: 1.03, RN confirmed lab collected from opposite arm of infusion; no overt bleeding reported; instructed RN to hold infusion and resume at  lower rate in 1 hour.  Goal of Therapy:  Heparin level 0.3-0.7 units/ml Monitor platelets by anticoagulation protocol: Yes   Plan:  Pause heparin infusion  No heparin bolus per MD (and sq heparin dose at 1500 pm) Resume heparin drip at 1100 units / hr @ 0400 Heparin level and CBC 8 hours after heparin starts Daily heparin level, CBC  Malik Rojas 08/19/2017,3:13 AM

## 2017-08-19 NOTE — Progress Notes (Signed)
PULMONARY / CRITICAL CARE MEDICINE   Name: Malik Rojas MRN: [id number] DOB: 07/08/1932    ADMISSION DATE:  08/11/2017 CONSULTATION DATE:  08/11/17  REFERRING MD:  Jeanell Sparrow  CHIEF COMPLAINT:  Cardiac Arrest  HISTORY OF PRESENT ILLNESS:  84 y.o. male with PMH as outlined below. He presented to [facility] ED 1/7 after cardiac arrest at home.  Per his wife, he had SOB that morning but wanted to go and work on his car.  He then collapsed and was agonal.  By the time EMS arrived, he was pulseless.  CPR was performed for 37 minutes prior to ROSC.  There was questions whether he initially had PEA or V.fib. Per report, pt had been feeling bad for the past week with respiratory symptoms.  He apparently saw his PCP and was told that he had a respiratory infection.   Prior to arrest, no sudden worsening, no chest pain. No recent fevers/chills/sweats, N/V/D, abd pain, myalgias.  In ED, he remained unresponsive.  Vitals were stable.  He was seen by cardiology and was not felt to be a candidate for emergent cath lab.  Given that his arrest was witnessed, downtime of 37 minutes, continued unresponsive and non-purposeful state post ROSC, he will be started on targeted temperature management with goal 33 degrees.  Wife states that pt is full code; however, he would not want prolonged heroic measures.  Code Status has subsequently been changed to DNR  SUBJECTIVE:    Tolerating extubation well. Weaned to 4L [place] with sats of 96%. Was put on low dose precedex  0.1-0.2 mcg) 1/15 early am for agitation.Currently paused. Pt for modified  swallow 1/15 at 11:30 am. Bump in creatinine and phos  VITAL SIGNS: BP (!) 122/54 (BP Location: Right Arm)   Pulse 62   Temp (!) 96.8 F (36 C) (Axillary)   Resp 20   Ht 5\' 10"  (1.778 m)   Wt 211 lb 10.3 oz (96 kg)   SpO2 98%   BMI 30.37 kg/m   HEMODYNAMICS:    VENTILATOR SETTINGS:    INTAKE / OUTPUT: I/O last 3 completed shifts: In: 1185.5 [P.O.:315; I.V.:30.5;  NG/GT:840] Out: 5660 [Urine:5060; Stool:600]  PHYSICAL EXAMINATION: General: elderly male, on 4L [place], asking for water. HEENT:  [place]/AT, PERRL, EOM-I and MMM, wearing  at 4 L Neuro: awake, alert, on low dose precedex, follows commands, nods appropriately,answers questions , slow processing at present, MAE  CV: RRR, Nl S1/S2 and -M/R/G. PULM: resps even non labored , coarse BS bilaterally, diminished per bases GI: Obese, soft, NT, ND and +BS Extremities: warm/dry, 1+ edema, no mottling  Skin: no rashes or lesions, warm and dry, intact with a few  Covered skin tears  LABS:  BMET Recent Labs  Lab 08/17/17 0250 08/18/17 0306 08/19/17 0149  NA 144 145 148*  K 3.7 3.4* 4.4  CL 109 107 107  CO2 22 23 26   BUN 82* 105* 121*  CREATININE 3.86* 3.79* 4.37*  GLUCOSE 305* 276* 152*   Electrolytes Recent Labs  Lab 08/17/17 0250 08/18/17 0306 08/19/17 0149  CALCIUM 8.6* 8.9 8.7*  MG 2.2 2.2 2.3  PHOS 3.8 4.1 6.6*   CBC Recent Labs  Lab 08/17/17 0250 08/18/17 0306 08/19/17 0149  WBC 10.5 10.5 14.5*  HGB 13.1 13.9 13.5  HCT 39.0 42.0 41.6  PLT 209 238 301   Coag's No results for input(s): APTT, INR in the last 168 hours. Sepsis Markers No results for input(s): LATICACIDVEN, PROCALCITON, O2SATVEN in the last  168 hours. ABG Recent Labs  Lab 08/16/17 0610 08/17/17 0348 08/18/17 0410  PHART 7.453* 7.487* 7.510*  PCO2ART 36.0 31.6* 32.2  PO2ART 68.1* 67.4* 67.3*   Liver Enzymes No results for input(s): AST, ALT, ALKPHOS, BILITOT, ALBUMIN in the last 168 hours. Cardiac Enzymes No results for input(s): TROPONINI, PROBNP in the last 168 hours. Glucose Recent Labs  Lab 08/18/17 1143 08/18/17 1550 08/18/17 1934 08/18/17 2342 08/19/17 0427 08/19/17 0835  GLUCAP 192* 129* 221* 159* 168* 134*   Imaging Dg Chest Portable 1 View  Result Date: 08/19/2017 CLINICAL DATA:  Respiratory failure. EXAM: PORTABLE CHEST 1 VIEW COMPARISON:  Radiograph of August 18, 2017.  FINDINGS: Stable cardiomegaly. Atherosclerosis of thoracic aorta is noted. Left-sided pacemaker is unchanged in position. No pneumothorax is noted. Endotracheal and nasogastric tubes have been removed. Stable bibasilar opacities are noted concerning for edema or atelectasis with associated pleural effusions. Bony thorax is unremarkable. IMPRESSION: Aortic atherosclerosis. Endotracheal and nasogastric tubes have been removed. Stable bibasilar atelectasis or edema is noted with associated pleural effusions. Electronically Signed   By: Marijo Conception, M.D.   On: 08/19/2017 07:45    STUDIES:  Echo 1/7 > EF 25-30%, diffuse hypokinesis, severe LVH, mid MR EEG 1/7 > This EEG is markedly abnormal due to burst-suppression pattern.  Clinical Correlation of the above findings indicates severe diffuse cerebral dysfunction that is non-specific in etiology and can be seen in the setting of anoxic/ischemic injury, toxic/metabolic encephalopathies, or medication effect from Propofol and Fentanyl. There were no electrographic seizures in this study. Clinical correlation is advised. LE duplex 1/14 > Final Interpretation Right: Acute deep vein thrombosis noted in the right common femoral and distal iliac veins. Left: There is no evidence of deep vein thrombosis in the lower extremity. CT head 1/7 > no ICH  CULTURES: Blood 1/7 > 1/2 blood cultures positive>>coag neg staph Sputum 1/7 > cancelled  BC x 2 1/10>>>  ANTIBIOTICS: 08/12/2017 vancomycin>>1/13 1/8 zosyn>>>1/13  SIGNIFICANT EVENTS: 1/7 > admit. 1/14> Extubation  LINES/TUBES: ETT 1/7 > 1/14 R fem CVL 1/7 > 1/10 Art line  1/7 > out  DISCUSSION: 84 y.o. male admitted 1/7 with cardiac arrest.  Likely respiratory leading to cardiac. Positive for DVT Right leg  ASSESSMENT / PLAN:  PULMONARY A: Acute hypoxic respiratory failure - s/p cardiac arrest likely related at least in part to pulmonary edema.  P:   CXR daily to follow effusions BLE  Dopplers >> + for DVT CXR daily Continue albuterol as needed for SOB Do Not re-intubate per family wishes Aggressive pulmonary toilet IS Mobilize as able  CARDIOVASCULAR A:  Cardiac Arrest - presumed initial respiratory leading to cardiac. Hx HTN, HLD, CHB combined heart failure (Echo from April 2018 with EF 45 - 50%, G1DD), CAD. P:  S/p 33 degree TTM  Echo as above, severe LV dysfunction, EF 25-30% Cardiology following. Lasix  per cards, ? Hold 1/15 as >> Creatinine has bumped 1/15 to 4.37 Continue preadmission ASA, pravastatin, coreg. Increase clonidine patch  Continue coreg, isordil, hydralazine for HTN Trend mag  RENAL AKI on CKD>> Creatinine bump to 4.37 1/15 Hypokalemia >> resolved Hyper phos>> 6.6 P:   Correct electrolytes as needed Trend BMET daily Monitor urine output Discontinue Lasix, per cards,  as bump in creatinine 1/15>> negative 13 L since admission Replete K PRN  No HD candidate  GASTROINTESTINAL A:   GI prophylaxis. Nutrition. Increased OG outpt - resolved.  For Modified swallow 1/15 P:   SUP: Pantoprazole. NPO until  passes swallow Diet per speech based on swallow recs 1/15  HEMATOLOGIC A:  + DVT Right common femoral and distal iliac veins.  Treatment dose heparin per pharmacy P:  [place] assist Heparin therapy goal 0.3-0.7 units per ml. heparin level CBC as needed Platelet monitoring  INFECTIOUS A:   08/12/2017 noted to have 1 of 2 blood cultures positive for MRSA - ?contaminant  Afebrile, but WBC increase to 14.5 P:   Trend fever curve and WBC Has indwelling AICD  Monitor off abx >> bump in WBC 1/15, but no fever Repeat BC 1/10 - ngtd>>>  ENDOCRINE A:   Hx DM. P:  CBG Q 4  SSI  Increase lantus to 10 units BID  May need to hold lantus once extubated and NPO until passes swallow.  NEUROLOGIC A:   At risk for anoxic encephalopathy. AMS - improving  08/15/2013 making slow progress towards less confused Answering  questions but slow processing  P:   RASS goal = 0  PRN fentanyl  Hold off on neuro input for now with improving mental status  Avoid sedating medications as able  Minimize sedation Wean low dose precedex as able  Family updated: No family bedside 1/15  Consider palliative consult and align plan of care with goals of care.   Magdalen Spatz, AGACNP-BC 08/19/2017  8:53 AM Pager:  [phone]

## 2017-08-19 NOTE — Progress Notes (Signed)
[place] [facility] for Heparin, coumadin  Indication: DVT  No Known Allergies  Patient Measurements: Height: 5' 10"  (177.8 cm) Weight: 211 lb 10.3 oz (96 kg) IBW/kg (Calculated) : 73  Vital Signs: Temp: 97.6 F (36.4 C) (01/15 1254) Temp Source: Oral (01/15 1254) BP: 154/67 (01/15 1200) Pulse Rate: 61 (01/15 1100)  Labs: Recent Labs    08/17/17 0250 08/18/17 0306 08/19/17 0149 08/19/17 1241  HGB 13.1 13.9 13.5  --   HCT 39.0 42.0 41.6  --   PLT 209 238 301  --   HEPARINUNFRC  --   --  1.03* 0.88*  CREATININE 3.86* 3.79* 4.37*  --     Estimated Creatinine Clearance: 14.9 mL/min (A) (by C-G formula based on SCr of 4.37 mg/dL (H)).   Medical History: Past Medical History:  Diagnosis Date  . Basal cell carcinoma    "scalp, arms, right face;  some cut, some burndt off"  . CAD (coronary artery disease)    LHC 3/14: Distal left main 10%, LAD 20%, circumflex 10-20%, proximal RCA 20%, distal 40-50%, EF 50-55%.  . Chronic combined systolic and diastolic CHF, NYHA class 4 ([place]) 11/06/2016   Echo 4/18: severe LVH, EF 45-50, global HK, Gr 1 DD, mild AS (mean 9/peak 17), MAC, mod LAE, trivial eff  . Complete heart block (HCC)    Status post Medtronic pacemaker  . High cholesterol   . HTN (hypertension)   . Hx of cardiovascular stress test    a. [place] MV 3/14:  Low risk, EF 36%, apical inf scar, no ischemia, inf-apical HK>> catheterization however demonstrated normal left ventricular function  . Melanoma ([place])    back  . Obesity   . Pneumonia 1940  . Presence of permanent cardiac pacemaker    Medtronic  . Type II diabetes mellitus ([place])   . Urinary frequency   . Urinary incontinence    Assessment: 84 year old male s/p prolonged CPR with CHF, respiratory failure, AKI on  heparin for new DVT. Spoke with Dr. Chase Caller and make also begin coumadin today -heparin level is 0.88 after decrease to 1100 units/hr -INR= 1.11 on 1/7  Goal of Therapy:   Heparin level 0.3-0.7 units/ml Monitor platelets by anticoagulation protocol: Yes   Plan:  -Decrease heparin to 900 units/hr -Heparin level in 8 hours and daily wth CBC daily -Coumadin 34m po today -Daily PT/INR  AHildred Laser Pharm D 08/19/2017 1:49 PM

## 2017-08-19 NOTE — Progress Notes (Signed)
0800: patient assessed 0830: SLP @ bedside for speech eval 1120: patient being transferred for barium sallow eval 1155: patient back from swallow eval   -new diet orders; dysphagia II, nectar thick 1200: patient reassessed; no changes unless otherwise charted 1215: patient working with PT; up to chair using the steady 1600: 1200: patient reassessed; no changes unless otherwise charted

## 2017-08-19 NOTE — Progress Notes (Signed)
Pt BIPAP at bedside- pt not wearing at this time- resting comfortably- RT to continue to monitor

## 2017-08-19 NOTE — Progress Notes (Signed)
Nutrition Follow-up  DOCUMENTATION CODES:   Obesity unspecified  INTERVENTION:   -Magic Cup TID with meals  NUTRITION DIAGNOSIS:   Predicted suboptimal nutrient intake related to chronic illness, dysphagia as evidenced by (s/p MBSS< just advanced to dysphagia 2 diet with nectar thick liquids).  Ongoing  GOAL:   Patient will meet greater than or equal to 90% of their needs  Progressing   MONITOR:   PO intake, Supplement acceptance, Diet advancement, Labs, Weight trends, Skin, I & O's  REASON FOR ASSESSMENT:   Consult, Ventilator Enteral/tube feeding initiation and management  ASSESSMENT:    84 yo male admitted post cardiac arrest with acute hypoxic respiratory failure, acute pulmoary edema, acute on CKD. Pt with hx of basal cell carcinoma, CAD, CHF, HTN, DM  1/10- TF on hold due to increased OGT output; KUB negative 1/11- TF restarted 1/13- rectal tube placed due to increased stool output 1/14- extubated   Pt s/p BSE today, which recommended continued NPO status. Pt s/p MBSS and was just advanced to a dysphagia 2 diet with nectar thick liquids. Pt was sitting in recliner chair at time of visit, lunch was just delivered and pt was setting himself up to eat.   Per cardiology notes, pt does not desire aggressive medical management. Palliative care consult has been requested for goals of care.   Labs reviewed: CBGS: 134-168 (inpatient orders for glycemic control are 2-6 units insulin aspart every 4 hours, 10 units insulin glargine BID).   Diet Order:  DIET DYS 2 Room service appropriate? Yes; Fluid consistency: Nectar Thick  EDUCATION NEEDS:   Not appropriate for education at this time  Skin:  Skin Assessment: Skin Integrity Issues: Skin Integrity Issues:: Other (Comment) Other: MASD buttocks  Last BM:  08/19/17 (200 ml via rectal tube)  Height:   Ht Readings from Last 1 Encounters:  08/11/17 5\' 10"  (1.778 m)    Weight:   Wt Readings from Last 1 Encounters:   08/19/17 211 lb 10.3 oz (96 kg)    Ideal Body Weight:  75.5 kg  BMI:  Body mass index is 30.37 kg/m.  Estimated Nutritional Needs:   Kcal:  1900-2100  Protein:  100-115 grams  Fluid:  1.9-2.1 L    Kahil Agner A. Jimmye Norman, RD, LDN, CDE Pager: [phone] After hours Pager: [phone]

## 2017-08-19 NOTE — Progress Notes (Signed)
Modified Barium Swallow Progress Note  Patient Details  Name: Malik Rojas MRN: [id number] Date of Birth: 1932/07/02  Today's Date: 08/19/2017  Modified Barium Swallow completed.  Full report located under Chart Review in the Imaging Section.  Brief recommendations include the following:  Clinical Impression  Pt's impairments are primarily related to oral phase with decreased lingual manipulation, lingual residue and decreased labial seal with difficulty transiting liquid into oral cavity. Laryngeal penetration with larger sip nectar and straw mild, above vocal cords. Overall timing of swallow was functional with one episode of trigger at valleculae. Mild vallecular residue due to reduced epiglottic inversion. Primary barriers to safe swallow are pt's suspected incomplete vocal cord adduction indicated by hoarse quality. Recommend nectar thick liquids, Dys 2, crush pills and assist for swallow precautions. Will see while in hospital.     Swallow Evaluation Recommendations       SLP Diet Recommendations: Dysphagia 2 (Fine chop) solids;Nectar thick liquid   Liquid Administration via: Cup;No straw   Medication Administration: Crushed with puree   Supervision: Patient able to self feed;Full supervision/cueing for compensatory strategies   Compensations: Slow rate;Small sips/bites   Postural Changes: Seated upright at 90 degrees   Oral Care Recommendations: Oral care BID        Houston Siren 08/19/2017,1:45 PM   leg length discrepancy

## 2017-08-19 NOTE — Care Management Note (Signed)
Case Management Note  Patient Details  Name: Malik Rojas MRN: [id number] Date of Birth: 04-15-1932  Subjective/Objective:  Pt admitted s/p witnessed cardiac arrest                 Action/Plan:   PTA from home.  Pt remains on vent and on hypothermia protocol - guarded prognosis   Expected Discharge Date:                  Expected Discharge Plan:  (from home with wife)  In-House Referral:     Discharge planning Services  CM Consult  Post Acute Care Choice:    Choice offered to:     DME Arranged:    DME Agency:     HH Arranged:    HH Agency:     Status of Service:     If discussed at [employer], dates discussed:    Additional Comments: 08/19/2017 Pt is now extubated to [place].  PT eval ordered Maryclare Labrador, RN 08/19/2017, 10:13 AM

## 2017-08-19 NOTE — Evaluation (Signed)
Physical Therapy Evaluation Patient Details Name: Malik Rojas MRN: [id number] DOB: [birth year] Today's Date: 08/19/2017   History of Present Illness  Pt is a 84 yo male admitted 1/7 s/p cardiac arrest at home. downtime was 37 minutes. PMH: baseal cell carcinoma, CAG, CH, HTN, high cholesterol  Clinical Impression  Pt admitted with above diagnosis. Pt currently with functional limitations due to the deficits listed below (see PT Problem List). Pt was indep PTA but now requires maxA for all mobility. Pt will benefit from skilled PT to increase their independence and safety with mobility to allow discharge to the venue listed below.       Follow Up Recommendations SNF;Supervision/Assistance - 24 hour    Equipment Recommendations  None recommended by PT(TBD at next venue)    Recommendations for Other Services       Precautions / Restrictions Precautions Precautions: Fall Restrictions Weight Bearing Restrictions: No      Mobility  Bed Mobility Overal bed mobility: Needs Assistance Bed Mobility: Rolling;Sidelying to Sit Rolling: Mod assist;Max assist Sidelying to sit: Mod assist;HOB elevated       General bed mobility comments: max directional v/c's, modA to reach full sidelying, mod/maxA for trunk elevation and to scoot to EOB  Transfers Overall transfer level: Needs assistance Equipment used: (2 person lift with gait belt and bed pad) Transfers: Sit to/from [employer] Transfers Sit to Stand: Max assist;+2 physical assistance   Squat pivot transfers: Max assist;+2 physical assistance     General transfer comment: unable to achieve full upright position/terminal knee and hip extension. completed squat pvt to chair, pt took 2 steps but maxA to maintain clearance from buttocks  Ambulation/Gait             General Gait Details: unable this date  Stairs            Wheelchair Mobility    Modified Rankin (Stroke Patients Only)       Balance  Overall balance assessment: Needs assistance Sitting-balance support: Feet supported;Bilateral upper extremity supported Sitting balance-Leahy Scale: Poor Sitting balance - Comments: pt unable to maintain sitting EOB balance, with onset of fatigue and body habitus pt would slow fall posteriorly Postural control: Posterior lean Standing balance support: Bilateral upper extremity supported Standing balance-Leahy Scale: Zero Standing balance comment: unable to achieve full standing                             Pertinent Vitals/Pain Pain Assessment: No/denies pain Faces Pain Scale: Hurts little more Pain Intervention(s): Monitored during session    Home Living Family/patient expects to be discharged to:: Skilled nursing facility Living Arrangements: Spouse/significant other Available Help at Discharge: Family;Available 24 hours/day Type of Home: House Home Access: Stairs to enter   [employer] of Steps: 2 Home Layout: One level        Prior Function Level of Independence: Independent         Comments: has cane, used it past few days before admission due to [place]        Extremity/Trunk Assessment   Upper Extremity Assessment Upper Extremity Assessment: Generalized weakness    Lower Extremity Assessment Lower Extremity Assessment: Generalized weakness    Cervical / Trunk Assessment Cervical / Trunk Assessment: Kyphotic  Communication   Communication: (soft spoken)  Cognition Arousal/Alertness: Awake/alert Behavior During Therapy: WFL for tasks assessed/performed Overall Cognitive Status: Impaired/Different from baseline Area of Impairment: Following commands;Safety/judgement;Problem solving  Following Commands: Follows one step commands with increased time Safety/Judgement: Decreased awareness of deficits   Problem Solving: Slow processing;Difficulty sequencing;Requires verbal cues;Requires  tactile cues General Comments: increased time       General Comments General comments (skin integrity, edema, etc.): pt with soft nodule on left side of back    Exercises     Assessment/Plan    PT Assessment Patient needs continued PT services  PT Problem List Decreased strength;Decreased range of motion;Decreased activity tolerance;Decreased balance;Decreased mobility;Decreased coordination;Decreased cognition;Decreased knowledge of use of DME;Obesity       PT Treatment Interventions DME instruction;Therapeutic activities;Gait training;Stair training;Functional mobility training;Therapeutic exercise;Balance training;Neuromuscular re-education    PT Goals (Current goals can be found in the Care Plan section)  Acute Rehab PT Goals Patient Stated Goal: home PT Goal Formulation: With patient Time For Goal Achievement: 09/02/17 Potential to Achieve Goals: Good    Frequency Min 3X/week   Barriers to discharge Decreased caregiver support wife available 24/7 however unable to provide maxA    Co-evaluation               AM-PAC PT "6 Clicks" Daily Activity  Outcome Measure Difficulty turning over in bed (including adjusting bedclothes, sheets and blankets)?: Unable Difficulty moving from lying on back to sitting on the side of the bed? : Unable Difficulty sitting down on and standing up from a chair with arms (e.g., wheelchair, bedside commode, etc,.)?: Unable Help needed moving to and from a bed to chair (including a wheelchair)?: A Lot Help needed walking in hospital room?: A Lot Help needed climbing 3-5 steps with a railing? : Total 6 Click Score: 8    End of Session Equipment Utilized During Treatment: Gait belt Activity Tolerance: Patient tolerated treatment well Patient left: in chair;with call bell/phone within reach;with nursing/sitter in room Nurse Communication: Mobility status;Need for lift equipment(RN present and assisted with transfer, edu to use steady) PT  Visit Diagnosis: Unsteadiness on feet (R26.81);Muscle weakness (generalized) (M62.81)    Time: 6222-9798 PT Time Calculation (min) (ACUTE ONLY): 22 min   Charges:   PT Evaluation $PT Eval Moderate Complexity: 1 Mod     PT G Codes:        Kittie Plater, PT, DPT Pager #: [phone] Office #: [phone]   [place] 08/19/2017, 1:35 PM

## 2017-08-19 NOTE — Progress Notes (Signed)
Progress Note  Patient Name: Malik Rojas Date of Encounter: 08/19/2017  Primary Cardiologist: Dr. Caryl Comes  Subjective   Pt extubated 08/18/17. Doing well this morning. He has made it clear to me today that he does not want aggressive interventions in his care. He is ready to go home and be with his family and pets.   Inpatient Medications    Scheduled Meds: . aspirin  81 mg Per Tube Daily  . carvedilol  25 mg Per Tube BID  . chlorhexidine  15 mL Mouth Rinse BID  . chlorhexidine gluconate (MEDLINE KIT)  15 mL Mouth Rinse BID  . furosemide  80 mg Intravenous Q6H  . insulin aspart  2-6 Units Subcutaneous Q4H  . insulin glargine  10 Units Subcutaneous BID  . isosorbide dinitrate  10 mg Oral TID  . mouth rinse  15 mL Mouth Rinse q12n4p  . pantoprazole  40 mg Oral QHS  . pravastatin  80 mg Per Tube q1800   Continuous Infusions: . dexmedetomidine (PRECEDEX) IV infusion Stopped (08/19/17 0905)  . heparin 1,100 Units/hr (08/19/17 0900)   PRN Meds: albuterol, artificial tears, fentaNYL (SUBLIMAZE) injection, hydrALAZINE, Influenza vac split quadrivalent PF, pneumococcal 23 valent vaccine   Vital Signs    Vitals:   08/19/17 0636 08/19/17 0800 08/19/17 0850 08/19/17 0900  BP:  (!) 122/54  (!) 142/54  Pulse:  62  62  Resp:      Temp:  (!) 96.8 F (36 C) (!) 96.8 F (36 C)   TempSrc:  Axillary Axillary   SpO2:  98%  100%  Weight: 211 lb 10.3 oz (96 kg)     Height:        Intake/Output Summary (Last 24 hours) at 08/19/2017 0951 Last data filed at 08/19/2017 0900 Gross per 24 hour  Intake 685.15 ml  Output 2920 ml  Net -2234.85 ml   Filed Weights   08/16/17 0347 08/17/17 0400 08/19/17 0636  Weight: 223 lb 1.7 oz (101.2 kg) 218 lb 14.7 oz (99.3 kg) 211 lb 10.3 oz (96 kg)    Physical Exam   General: Well developed, well nourished, NAD Skin: Warm, dry, intact  Head: Normocephalic, atraumatic, clear, moist mucus membranes. Neck: Negative for carotid bruits. No  JVD Lungs:Diminished lung sounds bilaterally. No wheezes, rales, or rhonchi. Breathing is unlabored. Cardiovascular: RRR with S1 S2. No murmurs, rubs, gallops, or LV heave appreciated. Abdomen: Soft, non-tender, non-distended with normoactive bowel sounds. No obvious abdominal masses. MSK: Strength and tone appear normal for age. 5/5 in all extremities Extremities: No edema. No clubbing or cyanosis. DP/PT pulses 1+ bilaterally Neuro: Alert and oriented. No focal deficits. No facial asymmetry. MAE spontaneously. Psych: Responds to questions appropriately with normal affect.    Labs    Chemistry Recent Labs  Lab 08/17/17 0250 08/18/17 0306 08/19/17 0149  NA 144 145 148*  K 3.7 3.4* 4.4  CL 109 107 107  CO2 '22 23 26  '$ GLUCOSE 305* 276* 152*  BUN 82* 105* 121*  CREATININE 3.86* 3.79* 4.37*  CALCIUM 8.6* 8.9 8.7*  GFRNONAA 13* 13* 11*  GFRAA 15* 16* 13*  ANIONGAP '13 15 15     '$ Hematology Recent Labs  Lab 08/17/17 0250 08/18/17 0306 08/19/17 0149  WBC 10.5 10.5 14.5*  RBC 4.15* 4.52 4.36  HGB 13.1 13.9 13.5  HCT 39.0 42.0 41.6  MCV 94.0 92.9 95.4  MCH 31.6 30.8 31.0  MCHC 33.6 33.1 32.5  RDW 14.4 14.4 14.9  PLT 209  238 301    Cardiac EnzymesNo results for input(s): TROPONINI in the last 168 hours. No results for input(s): TROPIPOC in the last 168 hours.   BNPNo results for input(s): BNP, PROBNP in the last 168 hours.   DDimer No results for input(s): DDIMER in the last 168 hours.   Radiology    Dg Chest Portable 1 View  Result Date: 08/19/2017 CLINICAL DATA:  Respiratory failure. EXAM: PORTABLE CHEST 1 VIEW COMPARISON:  Radiograph of August 18, 2017. FINDINGS: Stable cardiomegaly. Atherosclerosis of thoracic aorta is noted. Left-sided pacemaker is unchanged in position. No pneumothorax is noted. Endotracheal and nasogastric tubes have been removed. Stable bibasilar opacities are noted concerning for edema or atelectasis with associated pleural effusions. Bony thorax  is unremarkable. IMPRESSION: Aortic atherosclerosis. Endotracheal and nasogastric tubes have been removed. Stable bibasilar atelectasis or edema is noted with associated pleural effusions. Electronically Signed   By: Marijo Conception, M.D.   On: 08/19/2017 07:45   Dg Chest Port 1 View  Result Date: 08/18/2017 CLINICAL DATA:  Intubated patient, cardiac arrest, CHF respiratory failure, diabetes, former smoker. EXAM: PORTABLE CHEST 1 VIEW COMPARISON:  Portable chest x-ray of August 17, 2017 FINDINGS: The lungs are reasonably well inflated. There are bilateral pleural effusions layering posteriorly. The interstitial markings are slightly less conspicuous bilaterally today. The cardiac silhouette remains enlarged. The central pulmonary vascularity is less engorged and more distinct. The ICD is in stable position. There calcification in the wall of the thoracic aorta. The endotracheal tube tip projects approximately 3.4 cm above the carina. The esophagogastric tube tip and proximal port project below the inferior margin of the image. IMPRESSION: CHF with slight interval decrease in interstitial edema. Persistent bibasilar pleural effusions layering posteriorly. Thoracic aortic atherosclerosis. The support tubes are in reasonable position.  Assess Electronically Signed   By: David  Martinique M.D.   On: 08/18/2017 07:36    Telemetry    08/19/17 NSR with AV pacing - Personally Reviewed  ECG    08/12/17 AV paced HR 60 - Personally Reviewed  Cardiac Studies   Echo 08/12/17:  Study Conclusions  - Left ventricle: Diffuse hypokinesis mid and basal akinesis The cavity size was moderately dilated. Wall thickness was increased in a pattern of severe LVH. Systolic function was severely reduced. The estimated ejection fraction was in the range of 25% to 30%. Diffuse hypokinesis. - Aortic valve: There was mild regurgitation. - Mitral valve: Severely calcified annulus. Moderately thickened leaflets .  There was mild regurgitation. - Left atrium: The atrium was mildly dilated.  Patient Profile     84 y.o. male with PMH of NICM, CHB s/p PPM, non-obstructive CAD per cath report 2014, HTN, DM, HL who presented with sudden cardiac arrest on 08/11/17. EF found to be 25-30% with diffuse hypokinesis.   Assessment & Plan    1. Cardiac arrest: -Prolonged CPR with initial  rhythm PEA vs VF>>> hypothermia protocol. Pt extubated yesterday 08/18/17. CXR revealed pulmonary edema>> lasix was increased to '80mg'$  IV Q6H>>>consider holding or decreasing back down  given significant increase in creatinine today 4.37 today   -ASA, statin, BB   2. Acute on chronic combined systolic and diastolic heart failure: -NICM with decreased EF 25-30% -Pulmonary edema on CXR 08/18/17>>increased lasix for optimal post-intubation. Consider decreasing back down now that he is extubated, upward trending creatinine, and significant drop in weight  -Cr elevated today, 4.37, up from 3.79>3.86>3.42 -Weight, 211lb today, down 7 lb from yesterday -I&O -Hep gtt -On BB  3.  Respiratory failure: -Per PCCM -Extubated yesterday, on [place] this AM and communicating well -CXR to follow effusions -Do not re-intubate per pt  4. AKI III: -Cr, 4.37 today 3.79>3.86>3.42>4.0>3.55 -PCCM to increase lasix to '80mg'$  IV Q6H on 08/18/17>> hold today? -K repleted  -Pt negative 13L since admission, weight down 7lb from yesterday  -Renal consult? Not sure if the pt would agree to this. He is not interested in prolonging this hospitalization. Palliative care consult appropriate to optimize him and get him home?   5. HTN: -Improved from yesterday, 147/58>142/54>122/54 -BB, nitrate  Signed, Kathyrn Drown, NP    08/19/2017, 9:51 AM   Pager: [phone]  For questions or updates, please contact   Please consult [email or web address] for contact info under Cardiology/STEMI.  I have seen, examined and evaluated the patient this morning along with Sharee Pimple  her Glenford Peers, NP on morning rounds.  After reviewing all the available data and chart, we discussed the patients laboratory, study & physical findings as well as symptoms in detail. I agree with her findings, examination as well as impression recommendations as per our discussion.    Brisk diuresis overnight with high doses of Lasix, however his creatinine has gone back the wrong direction to 4.37 with worsening acute on chronic renal insufficiency.  We have stopped Lasix. -Would hold off for now as he seems to be fully diuresed.  He has pleural effusions, but these will not likely diurese well.    He has known severe ischemic cardiomyopathy with ICD placed.  He presented with cardiac arrest.  No further arrhythmias . As part of his admitting milieu, he had acute respiratory failure with hypoxia and hypercapnia secondary to acute on chronic combined systolic and diastolic heart failure.  He has been aggressively diuresed now.  --He had been on hydralazine and Isordil.  Hydralazine on hold to allow for some mild permissive hypertension and increased renal perfusion pressures.  (Probably easier to use clonidine patch, however clonidine is probably not a great option long-term)  Placed on heparin because of clot related to central venous line.  Obviously, cannot exclude ischemic event however he had had previous nonobstructive CAD, and with his significant acute on chronic combined renal insufficiency, no plans for ischemic evaluation.  The patient was successfully extubated yesterday awake and alert.  He is undergoing a swallow study today p.o. He basically would like to go home, and probably does not want any further evaluation.  He is DNR/DNI with no reintubation.  At this point I think it may not be unreasonable to consider palliative care consultation to determine goals of care and consider moving toward discharge once we see that his renal function has plateaued.  Glenetta Hew, M.D.,  M.S. Interventional Cardiologist   Pager # [phone] Phone # [phone] [address] [place], [place] [address]

## 2017-08-19 NOTE — Plan of Care (Signed)
Patient extubated on the 14th.  Sats remained stable through night.  Patient incentive spirometer improved with teaching.  He was very agitated and anxious early in shift but improved with presidex.  Pressures fell from 140s/90 to 80s/50s after BP meds given.  May need to adjust dosage in Am.  Swallow ability is questionable.  Patient continually asking for drinks or food but limited to  a couple ice chips at a time pending swallow evaluation by speech therapy.

## 2017-08-19 NOTE — Evaluation (Signed)
Clinical/Bedside Swallow Evaluation Patient Details  Name: Malik Rojas MRN: [id number] Date of Birth: 06-28-1932  Today's Date: 08/19/2017 Time: SLP Start Time (ACUTE ONLY): 0822 SLP Stop Time (ACUTE ONLY): 0843 SLP Time Calculation (min) (ACUTE ONLY): 21 min  Past Medical History:  Past Medical History:  Diagnosis Date  . Basal cell carcinoma    "scalp, arms, right face;  some cut, some burndt off"  . CAD (coronary artery disease)    LHC 3/14: Distal left main 10%, LAD 20%, circumflex 10-20%, proximal RCA 20%, distal 40-50%, EF 50-55%.  . Chronic combined systolic and diastolic CHF, NYHA class 4 ([place]) 11/06/2016   Echo 4/18: severe LVH, EF 45-50, global HK, Gr 1 DD, mild AS (mean 9/peak 17), MAC, mod LAE, trivial eff  . Complete heart block (HCC)    Status post Medtronic pacemaker  . High cholesterol   . HTN (hypertension)   . Hx of cardiovascular stress test    a. [place] MV 3/14:  Low risk, EF 36%, apical inf scar, no ischemia, inf-apical HK>> catheterization however demonstrated normal left ventricular function  . Melanoma ([place])    back  . Obesity   . Pneumonia 1940  . Presence of permanent cardiac pacemaker    Medtronic  . Type II diabetes mellitus ([place])   . Urinary frequency   . Urinary incontinence    Past Surgical History:  Past Surgical History:  Procedure Laterality Date  . APPENDECTOMY  40 years ago  . BASAL CELL CARCINOMA EXCISION     "scalp, right face"  . INSERT / REPLACE / REMOVE PACEMAKER  ~ 1999   medtronic EnRhythm  . MOHS SURGERY Left    ear; being monitored-every 6 months  . PERMANENT PACEMAKER GENERATOR CHANGE N/A 02/12/2013   Procedure: PERMANENT PACEMAKER GENERATOR CHANGE;  Surgeon: Deboraha Sprang, MD;  Location: [facility] CATH LAB;  Service: Cardiovascular;  Laterality: N/A;  . TONSILLECTOMY     as child  . TRANSURETHRAL RESECTION OF PROSTATE N/A 10/13/2014   Procedure: WOLF TRANSURETHRAL RESECTION OF THE PROSTATE (TURP);  Surgeon: Carolan Clines,  MD;  Location: [facility] ORS;  Service: Urology;  Laterality: N/A;   HPI:  This is a 84 year old man history of coronary artery disease, DM, HTN, complete heart block, pacemaker presents today status post cardiac arrest.Per chart resuscitation was 37 minutes with paced rhythm followed by return of pulses. Intubated 1/7-1/14. CXR 1/15 Aortic atherosclerosis. Endotracheal and nasogastric tubes have been removed. Stable bibasilar atelectasis or edema is noted with associated pleural effusions. CXR 1/14 CHF with slight interval decrease in interstitial edema. Persistent bibasilar pleural effusions layering posteriorly.   Assessment / Plan / Recommendation Clinical Impression  Pt's tongue is edematous with decreased ROM bilaterally resulting in mild difficulty manipulating and containg thin and puree texture with anterior spill. No pharyngeal indications of poor airway protection however risk for silent aspiration increased after 7 day intubation, hoarse vocal quality and weak cough. MBD scheduled for 11:30 to fully assess.  SLP Visit Diagnosis: Dysphagia, oral phase (R13.11)    Aspiration Risk  (mild-mod)    Diet Recommendation NPO        Other  Recommendations     Follow up Recommendations (TBD)      Frequency and Duration            Prognosis        Swallow Study   General HPI: This is a 84 year old man history of coronary artery disease, DM, HTN, complete heart block, pacemaker presents today  status post cardiac arrest.Per chart resuscitation was 37 minutes with paced rhythm followed by return of pulses. Intubated 1/7-1/14. CXR 1/15 Aortic atherosclerosis. Endotracheal and nasogastric tubes have been removed. Stable bibasilar atelectasis or edema is noted with associated pleural effusions. CXR 1/14 CHF with slight interval decrease in interstitial edema. Persistent bibasilar pleural effusions layering posteriorly. Type of Study: Bedside Swallow Evaluation Previous Swallow Assessment:  (none) Diet Prior to this Study: NPO Temperature Spikes Noted: No Respiratory Status: Nasal cannula History of Recent Intubation: Yes Length of Intubations (days): 8 days Date extubated: 08/18/17 Behavior/Cognition: Alert;Requires cueing;Cooperative Oral Cavity Assessment: Dry Oral Care Completed by SLP: Yes Oral Cavity - Dentition: Edentulous Vision: Functional for self-feeding Self-Feeding Abilities: Needs assist Patient Positioning: Upright in bed Baseline Vocal Quality: Hoarse Volitional Cough: Weak    Oral/Motor/Sensory Function Overall Oral Motor/Sensory Function: (mild-mod) Facial ROM: (generally weak) Facial Symmetry: Within Functional Limits Facial Strength: (weak lilaterally) Lingual ROM: (decr bilaterally, lingual edema) Lingual Symmetry: Within Functional Limits Lingual Strength: (decreased)   Ice Chips Ice chips: Not tested   Thin Liquid Thin Liquid: Impaired Presentation: Cup Oral Phase Impairments: Reduced labial seal Oral Phase Functional Implications: Right anterior spillage;Left anterior spillage Pharyngeal  Phase Impairments: (none)    Nectar Thick Nectar Thick Liquid: Not tested   Honey Thick Honey Thick Liquid: Not tested   Puree Puree: Impaired Presentation: Spoon;Self Fed Oral Phase Impairments: Reduced labial seal Oral Phase Functional Implications: Right anterior spillage;Left anterior spillage Pharyngeal Phase Impairments: (none)   Solid   GO   Solid: Not tested        [place] Siren 08/19/2017,8:54 AM  Malik Rojas.[employer] [phone]

## 2017-08-20 ENCOUNTER — Inpatient Hospital Stay (HOSPITAL_COMMUNITY): Payer: Medicare Other

## 2017-08-20 DIAGNOSIS — Z515 Encounter for palliative care: Secondary | ICD-10-CM

## 2017-08-20 DIAGNOSIS — R531 Weakness: Secondary | ICD-10-CM

## 2017-08-20 DIAGNOSIS — Z66 Do not resuscitate: Secondary | ICD-10-CM

## 2017-08-20 LAB — BASIC METABOLIC PANEL
Anion gap: 16 — ABNORMAL HIGH (ref 5–15)
BUN: 137 mg/dL — AB (ref 6–20)
CO2: 27 mmol/L (ref 22–32)
CREATININE: 4.43 mg/dL — AB (ref 0.61–1.24)
Calcium: 9.3 mg/dL (ref 8.9–10.3)
Chloride: 106 mmol/L (ref 101–111)
GFR calc Af Amer: 13 mL/min — ABNORMAL LOW (ref >60)
GFR calc non Af Amer: 11 mL/min — ABNORMAL LOW (ref >60)
GLUCOSE: 151 mg/dL — AB (ref 65–99)
Potassium: 4.3 mmol/L (ref 3.5–5.1)
SODIUM: 149 mmol/L — AB (ref 135–145)

## 2017-08-20 LAB — CBC WITH DIFFERENTIAL/PLATELET
BASOS ABS: 0 10*3/uL (ref 0.0–0.1)
Basophils Relative: 0 %
EOS ABS: 0.3 10*3/uL (ref 0.0–0.7)
EOS PCT: 2 %
HCT: 44.7 % (ref 39.0–52.0)
Hemoglobin: 14.6 g/dL (ref 13.0–17.0)
Lymphocytes Relative: 7 %
Lymphs Abs: 1 10*3/uL (ref 0.7–4.0)
MCH: 31.9 pg (ref 26.0–34.0)
MCHC: 32.7 g/dL (ref 30.0–36.0)
MCV: 97.6 fL (ref 78.0–100.0)
MONO ABS: 1.4 10*3/uL — AB (ref 0.1–1.0)
Monocytes Relative: 9 %
Neutro Abs: 12.3 10*3/uL — ABNORMAL HIGH (ref 1.7–7.7)
Neutrophils Relative %: 82 %
PLATELETS: 295 10*3/uL (ref 150–400)
RBC: 4.58 MIL/uL (ref 4.22–5.81)
RDW: 14.9 % (ref 11.5–15.5)
WBC: 15 10*3/uL — AB (ref 4.0–10.5)

## 2017-08-20 LAB — GLUCOSE, CAPILLARY
GLUCOSE-CAPILLARY: 280 mg/dL — AB (ref 65–99)
GLUCOSE-CAPILLARY: 129 mg/dL — AB (ref 65–99)
Glucose-Capillary: 143 mg/dL — ABNORMAL HIGH (ref 65–99)
Glucose-Capillary: 189 mg/dL — ABNORMAL HIGH (ref 65–99)
Glucose-Capillary: 136 mg/dL — ABNORMAL HIGH (ref 65–99)
Glucose-Capillary: 218 mg/dL — ABNORMAL HIGH (ref 65–99)

## 2017-08-20 LAB — PHOSPHORUS: Phosphorus: 6.8 mg/dL — ABNORMAL HIGH (ref 2.5–4.6)

## 2017-08-20 LAB — MAGNESIUM: MAGNESIUM: 2.5 mg/dL — AB (ref 1.7–2.4)

## 2017-08-20 LAB — PROTIME-INR
INR: 1.06
Prothrombin Time: 13.7 seconds (ref 11.4–15.2)

## 2017-08-20 LAB — HEPARIN LEVEL (UNFRACTIONATED): HEPARIN UNFRACTIONATED: 0.3 [IU]/mL (ref 0.30–0.70)

## 2017-08-20 MED ORDER — WARFARIN SODIUM 5 MG PO TABS
5.0000 mg | ORAL_TABLET | Freq: Once | ORAL | Status: AC
  Administered 2017-08-20: 5 mg via ORAL
  Filled 2017-08-20: qty 1

## 2017-08-20 MED ORDER — FENTANYL CITRATE (PF) 100 MCG/2ML IJ SOLN
25.0000 ug | INTRAMUSCULAR | Status: DC | PRN
  Administered 2017-08-22: 25 ug via INTRAVENOUS
  Filled 2017-08-20: qty 2

## 2017-08-20 NOTE — Progress Notes (Signed)
ANTICOAGULATION CONSULT NOTE - Follow Up Consult  Pharmacy Consult for Heparin  Indication: DVT  No Known Allergies  Patient Measurements: Height: 5\' 10"  (177.8 cm) Weight: 211 lb 10.3 oz (96 kg) IBW/kg (Calculated) : 73  Vital Signs: Temp: 98.5 F (36.9 C) (01/15 1959) Temp Source: Oral (01/15 1959) BP: 113/64 (01/15 2200) Pulse Rate: 84 (01/15 2200)  Labs: Recent Labs    08/17/17 0250 08/18/17 0306 08/19/17 0149 08/19/17 1241 08/19/17 2244  HGB 13.1 13.9 13.5  --   --   HCT 39.0 42.0 41.6  --   --   PLT 209 238 301  --   --   HEPARINUNFRC  --   --  1.03* 0.88* 0.33  CREATININE 3.86* 3.79* 4.37*  --   --     Estimated Creatinine Clearance: 14.9 mL/min (A) (by C-G formula based on SCr of 4.37 mg/dL (H)).  Assessment: 84 y/o M s/p prolonged CPR/resp failure, now on heparin for new onset DVT, heparin level therapeutic x 1 after rate decrease  Goal of Therapy:  Heparin level 0.3-0.7 units/ml Monitor platelets by anticoagulation protocol: Yes   Plan:  Cont heparin at 900 units/hr Confirmatory heparin level with AM labs  Narda Bonds 08/20/2017,12:06 AM

## 2017-08-20 NOTE — Progress Notes (Signed)
PULMONARY / CRITICAL CARE MEDICINE   Name: Malik Rojas MRN: [id number] DOB: Sep 01, 1931    ADMISSION DATE:  08/11/2017 CONSULTATION DATE:  08/11/17  REFERRING MD:  Jeanell Sparrow  CHIEF COMPLAINT:  Cardiac Arrest  HISTORY OF PRESENT ILLNESS:  84 y.o. male with PMH as outlined below. He presented to [facility] ED 1/7 after cardiac arrest at home.  Per his wife, he had SOB that morning but wanted to go and work on his car.  He then collapsed and was agonal.  By the time EMS arrived, he was pulseless.  CPR was performed for 37 minutes prior to ROSC.  There was questions whether he initially had PEA or V.fib. Per report, pt had been feeling bad for the past week with respiratory symptoms.  He apparently saw his PCP and was told that he had a respiratory infection.   Prior to arrest, no sudden worsening, no chest pain. No recent fevers/chills/sweats, N/V/D, abd pain, myalgias.  In ED, he remained unresponsive.  Vitals were stable.  He was seen by cardiology and was not felt to be a candidate for emergent cath lab.  Given that his arrest was witnessed, downtime of 37 minutes, continued unresponsive and non-purposeful state post ROSC, he will be started on targeted temperature management with goal 33 degrees.  Wife states that pt is full code; however, he would not want prolonged heroic measures.  Code Status has subsequently been changed to DNR  SUBJECTIVE:    Looks great.  No c/o.  Eating breakfast.  Respiratory status stable.  Passed swallow eval with diet modifications.    VITAL SIGNS: BP (!) 144/94 (BP Location: Right Arm)   Pulse 79   Temp 97.7 F (36.5 C) (Oral)   Resp 17   Ht _0  (1.778 m)   Wt 96 kg (211 lb 10.3 oz)   SpO2 94%   BMI 30.37 kg/m    INTAKE / OUTPUT: I/O last 3 completed shifts: In: 644.2 [P.O.:75; I.V.:569.2] Out: 2920 [Urine:2720; Stool:200]  PHYSICAL EXAMINATION: General: elderly male, pleasant, NAD HEENT:  [place]/AT, PERRL, EOM-I and MMM, wearing [place] at 4 L Neuro:  awake, alert, mostly oriented, MAE, gen weakness CV: RRR, Nl S1/S2 and -M/R/G. PULM: resps even non labored on [place] , coarse BS bilaterally GI: Obese, soft, NT, ND and +BS Extremities: warm/dry, 1+ edema, venous stasis  Skin: no rashes or lesions, warm and dry, intact with a few  Covered skin tears  LABS:  BMET Recent Labs  Lab 08/18/17 0306 08/19/17 0149 08/20/17 0402  NA 145 148* 149*  K 3.4* 4.4 4.3  CL 107 107 106  CO2 _1 BUN 105* 121* 137*  CREATININE 3.79* 4.37* 4.43*  GLUCOSE 276* 152* 151*   Electrolytes Recent Labs  Lab 08/18/17 0306 08/19/17 0149 08/20/17 0402  CALCIUM 8.9 8.7* 9.3  MG 2.2 2.3 2.5*  PHOS 4.1 6.6* 6.8*   CBC Recent Labs  Lab 08/18/17 0306 08/19/17 0149 08/20/17 0402  WBC 10.5 14.5* 15.0*  HGB 13.9 13.5 14.6  HCT 42.0 41.6 44.7  PLT 238 301 295   Coag's Recent Labs  Lab 08/20/17 0402  INR 1.06   Sepsis Markers No results for input(s): LATICACIDVEN, PROCALCITON, O2SATVEN in the last 168 hours. ABG Recent Labs  Lab 08/16/17 0610 08/17/17 0348 08/18/17 0410  PHART 7.453* 7.487* 7.510*  PCO2ART 36.0 31.6* 32.2  PO2ART 68.1* 67.4* 67.3*   Liver Enzymes No results for input(s): AST, ALT, ALKPHOS, BILITOT, ALBUMIN in the last 168  hours. Cardiac Enzymes No results for input(s): TROPONINI, PROBNP in the last 168 hours. Glucose Recent Labs  Lab 08/19/17 1238 08/19/17 1605 08/19/17 1954 08/20/17 0034 08/20/17 0329 08/20/17 0832  GLUCAP 144* 248* 228* 189* 143* 136*   Imaging Dg Chest Port 1 View  Result Date: 08/20/2017 CLINICAL DATA:  Respiratory failure. EXAM: PORTABLE CHEST 1 VIEW COMPARISON:  Radiograph of August 19, 2017. FINDINGS: Stable cardiomegaly. Left-sided pacemaker is unchanged in position. No pneumothorax is noted. Mild left basilar subsegmental atelectasis is noted. Stable bibasilar atelectasis or edema is noted with associated pleural effusions, right greater than left. Bony thorax is unremarkable.  IMPRESSION: Stable bibasilar atelectasis or edema is noted with associated pleural effusions, right greater than left. Electronically Signed   By: Marijo Conception, M.D.   On: 08/20/2017 09:24   Dg Swallowing Func-speech Pathology  Result Date: 08/19/2017 Objective Swallowing Evaluation: Type of Study: MBS-Modified Barium Swallow Study  Patient Details Name: Malik Rojas MRN: [id number] Date of Birth: 1932-01-21 Today's Date: 08/19/2017 Time: SLP Start Time (ACUTE ONLY): 1148 -SLP Stop Time (ACUTE ONLY): 1205 SLP Time Calculation (min) (ACUTE ONLY): 17 min Past Medical History: Past Medical History: Diagnosis Date . Basal cell carcinoma   "scalp, arms, right face;  some cut, some burndt off" . CAD (coronary artery disease)   LHC 3/14: Distal left main 10%, LAD 20%, circumflex 10-20%, proximal RCA 20%, distal 40-50%, EF 50-55%. . Chronic combined systolic and diastolic CHF, NYHA class 4 ([place]) 11/06/2016  Echo 4/18: severe LVH, EF 45-50, global HK, Gr 1 DD, mild AS (mean 9/peak 17), MAC, mod LAE, trivial eff . Complete heart block (HCC)   Status post Medtronic pacemaker . High cholesterol  . HTN (hypertension)  . Hx of cardiovascular stress test   a. [place] MV 3/14:  Low risk, EF 36%, apical inf scar, no ischemia, inf-apical HK>> catheterization however demonstrated normal left ventricular function . Melanoma ([place])   back . Obesity  . Pneumonia 1940 . Presence of permanent cardiac pacemaker   Medtronic . Type II diabetes mellitus ([place])  . Urinary frequency  . Urinary incontinence  Past Surgical History: Past Surgical History: Procedure Laterality Date . APPENDECTOMY  40 years ago . BASAL CELL CARCINOMA EXCISION    "scalp, right face" . INSERT / REPLACE / REMOVE PACEMAKER  ~ 1999  medtronic EnRhythm . MOHS SURGERY Left   ear; being monitored-every 6 months . PERMANENT PACEMAKER GENERATOR CHANGE N/A 02/12/2013  Procedure: PERMANENT PACEMAKER GENERATOR CHANGE;  Surgeon: Deboraha Sprang, MD;  Location: [facility] CATH LAB;   Service: Cardiovascular;  Laterality: N/A; . TONSILLECTOMY    as child . TRANSURETHRAL RESECTION OF PROSTATE N/A 10/13/2014  Procedure: WOLF TRANSURETHRAL RESECTION OF THE PROSTATE (TURP);  Surgeon: Carolan Clines, MD;  Location: [facility] ORS;  Service: Urology;  Laterality: N/A; HPI: This is a 84 year old man history of coronary artery disease, DM, HTN, complete heart block, pacemaker presents today status post cardiac arrest.Per chart resuscitation was 37 minutes with paced rhythm followed by return of pulses. Intubated 1/7-1/14. CXR 1/15 Aortic atherosclerosis. Endotracheal and nasogastric tubes have been removed. Stable bibasilar atelectasis or edema is noted with associated pleural effusions. CXR 1/14 CHF with slight interval decrease in interstitial edema. Persistent bibasilar pleural effusions layering posteriorly.  No Data Recorded Assessment / Plan / Recommendation CHL IP CLINICAL IMPRESSIONS 08/19/2017 Clinical Impression Pt's impairments are primarily related to oral phase with decreased lingual manipulation, lingual residue and decreased labial seal with difficulty transiting liquid into oral cavity.  Laryngeal penetration with larger sip nectar and straw mild, above vocal cords. Overall timing of swallow was functional with one episode of trigger at valleculae. Mild vallecular residue due to reduced epiglottic inversion. Primary barriers to safe swallow are pt's suspected incomplete vocal cord adduction indicated by hoarse quality. Recommend nectar thick liquids, Dys 2, crush pills and assist for swallow precautions. Will see while in hospital.   SLP Visit Diagnosis Dysphagia, oropharyngeal phase (R13.12) Attention and concentration deficit following -- Frontal lobe and executive function deficit following -- Impact on safety and function Moderate aspiration risk   CHL IP TREATMENT RECOMMENDATION 08/19/2017 Treatment Recommendations Therapy as outlined in treatment plan below   Prognosis 08/19/2017 Prognosis  for Safe Diet Advancement Good Barriers to Reach Goals Behavior Barriers/Prognosis Comment -- CHL IP DIET RECOMMENDATION 08/19/2017 SLP Diet Recommendations Dysphagia 2 (Fine chop) solids;Nectar thick liquid Liquid Administration via Cup;No straw Medication Administration Crushed with puree Compensations Slow rate;Small sips/bites Postural Changes Seated upright at 90 degrees   CHL IP OTHER RECOMMENDATIONS 08/19/2017 Recommended Consults -- Oral Care Recommendations Oral care BID Other Recommendations --   CHL IP FOLLOW UP RECOMMENDATIONS 08/19/2017 Follow up Recommendations (No Data)   CHL IP FREQUENCY AND DURATION 08/19/2017 Speech Therapy Frequency (ACUTE ONLY) min 2x/week Treatment Duration 2 weeks      CHL IP ORAL PHASE 08/19/2017 Oral Phase Impaired Oral - Pudding Teaspoon -- Oral - Pudding Cup -- Oral - Honey Teaspoon -- Oral - Honey Cup Lingual/palatal residue Oral - Nectar Teaspoon -- Oral - Nectar Cup Lingual/palatal residue Oral - Nectar Straw Lingual/palatal residue Oral - Thin Teaspoon -- Oral - Thin Cup -- Oral - Thin Straw -- Oral - Puree Lingual/palatal residue Oral - Mech Soft Lingual/palatal residue Oral - Regular -- Oral - Multi-Consistency -- Oral - Pill -- Oral Phase - Comment --  CHL IP PHARYNGEAL PHASE 08/19/2017 Pharyngeal Phase Impaired Pharyngeal- Pudding Teaspoon -- Pharyngeal -- Pharyngeal- Pudding Cup -- Pharyngeal -- Pharyngeal- Honey Teaspoon -- Pharyngeal -- Pharyngeal- Honey Cup WFL Pharyngeal -- Pharyngeal- Nectar Teaspoon -- Pharyngeal -- Pharyngeal- Nectar Cup Delayed swallow initiation-vallecula Pharyngeal -- Pharyngeal- Nectar Straw Penetration/Aspiration during swallow Pharyngeal Material enters airway, remains ABOVE vocal cords and not ejected out Pharyngeal- Thin Teaspoon -- Pharyngeal -- Pharyngeal- Thin Cup -- Pharyngeal -- Pharyngeal- Thin Straw -- Pharyngeal -- Pharyngeal- Puree WFL Pharyngeal -- Pharyngeal- Mechanical Soft Pharyngeal residue - valleculae Pharyngeal --  Pharyngeal- Regular -- Pharyngeal -- Pharyngeal- Multi-consistency -- Pharyngeal -- Pharyngeal- Pill -- Pharyngeal -- Pharyngeal Comment --  CHL IP CERVICAL ESOPHAGEAL PHASE 08/19/2017 Cervical Esophageal Phase WFL Pudding Teaspoon -- Pudding Cup -- Honey Teaspoon -- Honey Cup -- Nectar Teaspoon -- Nectar Cup -- Nectar Straw -- Thin Teaspoon -- Thin Cup -- Thin Straw -- Puree -- Mechanical Soft -- Regular -- Multi-consistency -- Pill -- Cervical Esophageal Comment -- No flowsheet data found. Houston Siren 08/19/2017, 1:44 PM  Orbie Pyo Colvin Caroli.Ed CCC-SLP Pager [phone]              STUDIES:  Echo 1/7 > EF 25-30%, diffuse hypokinesis, severe LVH, mid MR EEG 1/7 > This EEG is markedly abnormal due to burst-suppression pattern.  Clinical Correlation of the above findings indicates severe diffuse cerebral dysfunction that is non-specific in etiology and can be seen in the setting of anoxic/ischemic injury, toxic/metabolic encephalopathies, or medication effect from Propofol and Fentanyl. There were no electrographic seizures in this study. Clinical correlation is advised. LE duplex 1/14 > Final Interpretation Right: Acute deep vein  thrombosis noted in the right common femoral and distal iliac veins. Left: There is no evidence of deep vein thrombosis in the lower extremity. CT head 1/7 > no ICH  CULTURES: Blood 1/7 > 1/2 blood cultures positive>>coag neg staph Sputum 1/7 > cancelled  BC x 2 1/10>>>  ANTIBIOTICS: 08/12/2017 vancomycin>>1/13 1/8 zosyn>>>1/13  SIGNIFICANT EVENTS: 1/7 > admit. 1/14> Extubation  LINES/TUBES: ETT 1/7 > 1/14 R fem CVL 1/7 > 1/10 Art line  1/7 > out  DISCUSSION: 84 y.o. male admitted 1/7 with cardiac arrest. Positive for DVT Right leg  ASSESSMENT / PLAN:  PULMONARY A: Acute hypoxic respiratory failure - s/p cardiac arrest likely related at least in part to pulmonary edema.  P:   CXR daily to follow effusions BLE Dopplers >> + for DVT Mobilize   PRN BD  Do Not re-intubate per family wishes Aggressive pulmonary toilet IS   CARDIOVASCULAR A:  Cardiac Arrest - presumed initial respiratory leading to cardiac. Hx HTN, HLD, CHB combined heart failure (Echo from April 2018 with EF 45 - 50%, G1DD), CAD. P:  S/p 33 degree TTM  Echo as above, severe LV dysfunction, EF 25-30% Cardiology following. Holding diuresis for now with slightly worsening AKI  Continue preadmission ASA, pravastatin, coreg. clonidine patch  Continue coreg, isordil, hydralazine for HTN Trend mag  RENAL AKI on CKD>>Scr rising slowly Hypokalemia >> resolved Hyper phos>> 6.6 P:   Correct electrolytes as needed Trend BMET daily Monitor urine output Holding lasix  Could consider cards input if continues to worsen although pt is clear that he would not want dialysis Replete K PRN    GASTROINTESTINAL A:   GI prophylaxis. Nutrition. Increased OG outpt - resolved.  For Modified swallow 1/15 P:   SUP: Pantoprazole. PO diet per speech recs    HEMATOLOGIC A:  + DVT Right common femoral and distal iliac veins.  Treatment dose heparin per pharmacy P:  [place] assist Heparin -> coumadin per pharmacy  CBC as needed Platelet monitoring  INFECTIOUS A:   08/12/2017 noted to have 1 of 2 blood cultures positive for MRSA - ?contaminant  Afebrile Recent Labs  Lab 08/16/17 0301 08/17/17 0250 08/18/17 0306 08/19/17 0149 08/20/17 0402  WBC 9.4 10.5 10.5 14.5* 15.0*   P:   Trend fever curve and WBC Has indwelling AICD  Monitor off abx >> bump in WBC 1/15, but no fever Repeat BC 1/10 neg   ENDOCRINE A:   Hx DM. P:  CBG Q 4  SSI, lantus    NEUROLOGIC A:   AMS - improving  P:   PRN fentanyl  Avoid sedating medications as able    Family updated: No family bedside 1/16  tx tele  Continue PT efforts  PT recommends SNF but pt clear that he wants to go home.  May have palliative care see to assist with symptom management and  facilitate potential options for home (?Hospice assistance) rather than SNF.  Will ask [facility] to assume care in am 1/17.   Nickolas Madrid, NP 08/20/2017  9:41 AM Pager: [phone] or [phone]

## 2017-08-20 NOTE — Consult Note (Signed)
Consultation Note Date: 08/20/2017   Patient Name: Malik Rojas  DOB: 01/18/1932  MRN: [id number]  Age / Sex: 84 y.o., male  PCP: Denita Lung, MD Referring Physician: Brand Males, MD  Reason for Consultation: Establishing goals of care and Psychosocial/spiritual support  HPI/Patient Profile: 84 y.o. male   admitted on 08/11/2017 when he presented to [facility] ED 1/7 after cardiac arrest at home.  Per his wife, he had SOB that morning but wanted to go and work on his car.  He then collapsed and was agonal.  By the time EMS arrived, he was pulseless.  CPR was performed for 37 minutes prior to ROSC.  There was questions whether he initially had PEA or V.fib.  In ED, he remained unresponsive.   He was seen by cardiology and was not felt to be a candidate for emergent cath lab.  Given that his arrest was witnessed, downtime of 37 minutes, continued unresponsive and non-purposeful state post ROSC, started on targeted temperature management with goal 33 degrees.  Currently he has been successfully extubated however, his kidney function is worsening as reflected in rising serum creatinine, he is weak, high risk for decompensation.  Patient and family face advance directive decisions and anticipatory care needs.  Clinical Assessment and Goals of Care:  This NP Wadie Lessen and another PMT Quinn Axe NP reviewed medical records, received report from team, assessed the patient and then meet at the patient's bedside along with his daughter and grand-daughter  to discuss diagnosis, prognosis, GOC, EOL wishes disposition and options.  Concept of Hospice and Palliative Care were discussed  A detailed discussion was had today regarding advanced directives.  Concepts specific to code status, artifical feeding and hydration, continued IV antibiotics and rehospitalization was had.     The difference between a  aggressive medical intervention path  and a palliative comfort care path for this patient at this time was had.  Values and goals of care important to patient and family were attempted to be elicited.  Patient was pleased to share parts of his life review with this nurse practitioner in the presence of his family.  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  He very clearly verbalizes that he is not afraid to die, as a matter of fact he  is ready and does not desire any extreme life prolonging measures specifically dialysis.  Natural trajectory and expectations at EOL were discussed.  Questions and concerns addressed.   Family encouraged to call with questions or concerns.    PMT will continue to support holistically.   NEXT OF KIN- Linda Atkins/daughter    SUMMARY OF RECOMMENDATIONS    Code Status/Advance Care Planning:  DNR   Palliative Prophylaxis:   Aspiration, Delirium Protocol, Frequent Pain Assessment and Oral Care  Additional Recommendations (Limitations, Scope, Preferences):  -Continue to treat the treatable and monitor renal function.  Patient understands the seriousness of the situation and will make decisions depending on outcomes over  the next 24-48 hours.  Patient is clearly leaning towards a comfort approach    Psycho-social/Spiritual:   Desire for further Chaplaincy support:yes  Additional Recommendations: Education on Hospice  Prognosis:   < 3 months  Discharge Planning:  This NP will meet with patient and family again in the morning   To Be Determined      Primary Diagnoses: Present on Admission: . Cardiac arrest ([place]) . Diabetes mellitus due to underlying condition with diabetic nephropathy, without long-term current use of insulin ([place]) . Acute hypercapnic respiratory failure ([place]) . Uncontrolled hypertension . Acute pulmonary edema (HCC) . Hypertensive heart disease with CHF (congestive  heart failure) ([place]) . Acute on chronic combined systolic and diastolic CHF, NYHA class 3 ([place]) . Acute renal failure superimposed on stage 3 chronic kidney disease ([place])   I have reviewed the medical record, interviewed the patient and family, and examined the patient. The following aspects are pertinent.  Past Medical History:  Diagnosis Date  . Basal cell carcinoma    "scalp, arms, right face;  some cut, some burndt off"  . CAD (coronary artery disease)    LHC 3/14: Distal left main 10%, LAD 20%, circumflex 10-20%, proximal RCA 20%, distal 40-50%, EF 50-55%.  . Chronic combined systolic and diastolic CHF, NYHA class 4 ([place]) 11/06/2016   Echo 4/18: severe LVH, EF 45-50, global HK, Gr 1 DD, mild AS (mean 9/peak 17), MAC, mod LAE, trivial eff  . Complete heart block (HCC)    Status post Medtronic pacemaker  . High cholesterol   . HTN (hypertension)   . Hx of cardiovascular stress test    a. [place] MV 3/14:  Low risk, EF 36%, apical inf scar, no ischemia, inf-apical HK>> catheterization however demonstrated normal left ventricular function  . Melanoma ([place])    back  . Obesity   . Pneumonia 1940  . Presence of permanent cardiac pacemaker    Medtronic  . Type II diabetes mellitus ([place])   . Urinary frequency   . Urinary incontinence    Social History   Socioeconomic History  . Marital status: Married    Spouse name: Jolene  . Number of children: Y  . Years of education: None  . Highest education level: None  Social Needs  . Financial resource strain: None  . Food insecurity - worry: None  . Food insecurity - inability: None  . Transportation needs - medical: None  . Transportation needs - non-medical: None  Occupational History  . Occupation: truck Geophysicist/field seismologist    Comment: retired  Tobacco Use  . Smoking status: Former Smoker    Packs/day: 3.00    Years: 30.00    Pack years: 90.00    Last attempt to quit: 1975    Years since quitting: 44.0  . Smokeless tobacco: Never Used    Substance and Sexual Activity  . Alcohol use: Yes    Comment: 11/06/2016 "I'll have 1 drink/year, maybe"  . Drug use: No  . Sexual activity: Not Currently  Other Topics Concern  . None  Social History Narrative  . None   Family History  Problem Relation Age of Onset  . Leukemia Mother    Scheduled Meds: . aspirin  81 mg Per Tube Daily  . carvedilol  25 mg Per Tube BID  . insulin aspart  2-6 Units Subcutaneous Q4H  . insulin glargine  10 Units Subcutaneous BID  . isosorbide dinitrate  10 mg Oral TID  . mouth rinse  15 mL Mouth  Rinse q12n4p  . pantoprazole  40 mg Oral QHS  . pravastatin  80 mg Per Tube q1800  . warfarin  5 mg Oral ONCE-1800  . Warfarin - Pharmacist Dosing Inpatient   Does not apply q1800   Continuous Infusions: . heparin 950 Units/hr (08/20/17 1316)   PRN Meds:.albuterol, artificial tears, fentaNYL (SUBLIMAZE) injection, hydrALAZINE, Influenza vac split quadrivalent PF, pneumococcal 23 valent vaccine, RESOURCE THICKENUP CLEAR Medications Prior to Admission:  Prior to Admission medications   Medication Sig Start Date End Date Taking? Authorizing Provider  carvedilol (COREG) 25 MG tablet Take 1 tablet (25 mg total) 2 (two) times daily by mouth. 06/10/17 09/08/17 Yes Weaver, Scott T, PA-C  isosorbide mononitrate (IMDUR) 60 MG 24 hr tablet Take 1 tablet (60 mg total) by mouth daily. 04/03/17  Yes Deboraha Sprang, MD  lisinopril (PRINIVIL,ZESTRIL) 20 MG tablet Take 1 tablet (20 mg total) by mouth 2 (two) times daily. 04/03/17 08/12/17 Yes Deboraha Sprang, MD  aspirin 81 MG tablet Take 81 mg by mouth daily.    [provider]  CINNAMON PO Take 1,000 mg by mouth 2 (two) times daily as needed.    [provider]  furosemide (LASIX) 40 MG tablet Take 1 tablet (40 mg total) by mouth daily. 04/03/17 07/02/17  Deboraha Sprang, MD  glucose blood (ONE TOUCH ULTRA TEST) test strip TEST twice a day DX E11.9 04/03/17   Denita Lung, MD  Insulin Glargine (TOUJEO MAX  SOLOSTAR) 300 UNIT/ML SOPN Inject 12 Units into the skin daily. 04/03/17   Denita Lung, MD  Insulin Pen Needle (BD ULTRA-FINE MICRO PEN NEEDLE) 32G X 6 MM MISC Patient is to use one pen needle per day DX E11.9 04/03/17   Denita Lung, MD  Omega-3 Fatty Acids (FISH OIL) 1000 MG CAPS Take 1,000 mg by mouth daily.     [provider]  [facility] DELICA LANCETS 16S MISC TEST TWICE DAILY DX E11.9 04/03/17   Denita Lung, MD  potassium chloride (K-DUR) 10 MEQ tablet Take 10 mEq by mouth daily.    [provider]  pravastatin (PRAVACHOL) 80 MG tablet Take 1 tablet (80 mg total) by mouth daily. 04/03/17   Denita Lung, MD  psyllium (REGULOID) 0.52 g capsule Take 0.52 g by mouth daily.    [provider]  Pumpkin Seed-Soy Germ (AZO BLADDER CONTROL/GO-LESS) CAPS Take 1 capsule by mouth daily.    [provider]  silver sulfADIAZINE (SILVADENE) 1 % cream Apply 1 application topically daily as needed (LEG SORE).    [provider]   No Known Allergies Review of Systems  Constitutional: Positive for fatigue.    Physical Exam  Constitutional: He appears well-developed. He appears lethargic. He appears ill. Nasal cannula in place.  Cardiovascular: Normal rate, regular rhythm and normal heart sounds.  Pulmonary/Chest: Effort normal.  Neurological: He appears lethargic.  Skin: Skin is warm and dry.    Vital Signs: BP (!) 167/65   Pulse 60   Temp (!) 96.7 F (35.9 C) (Oral)   Resp 15   Ht _0  (1.778 m)   Wt 96 kg (211 lb 10.3 oz)   SpO2 94%   BMI 30.37 kg/m  Pain Assessment: No/denies pain   Pain Score: 0-No pain   SpO2: SpO2: 94 % O2 Device:SpO2: 94 % O2 Flow Rate: .O2 Flow Rate (L/min): 4 L/min  IO: Intake/output summary:   Intake/Output Summary (Last 24 hours) at 08/20/2017 1415 Last data  filed at 08/20/2017 1300 Gross per 24 hour  Intake 427 ml  Output 1525 ml  Net -1098 ml    LBM: Last BM Date: 08/19/17 Baseline Weight:  Weight: 105.2 kg (231 lb 14.8 oz) Most recent weight: Weight: 96 kg (211 lb 10.3 oz)     Palliative Assessment/Data:  30% at best    Discussed with Georgann Housekeeper NP with CCM  Time In: 1030 Time Out: 1145 Time Total: 75 minutes Greater than 50%  of this time was spent counseling and coordinating care related to the above assessment and plan.  Signed by: Wadie Lessen, NP   Please contact Palliative Medicine Team phone at [phone] for questions and concerns.  For individual provider: See Shea Evans

## 2017-08-20 NOTE — Progress Notes (Signed)
  Speech Language Pathology Treatment: Dysphagia  Patient Details Name: Malik Rojas MRN: [id number] DOB: 01/30/1932 Today's Date: 08/20/2017 Time: 4431-5400 SLP Time Calculation (min) (ACUTE ONLY): 15 min  Assessment / Plan / Recommendation Clinical Impression  SLP assisted pt during lunch meal reviewing results/recommendations of MBS yesterday. Dys 2 meat and collard greens were in larger pieces than expected for Dys 2 texture. Pt pocketing in left buccal cavity, stating awareness but unable or unwilling to remove when cued, stating "it will be gone at the end." No pharyngeal indications of airway compromise. Spoke with nurse and requested she check mouth at end of meal. She reported mild pocketing with breakfast this morning. ST will continue to follow and downgrade to puree if needed.    HPI HPI: This is a 84 year old man history of coronary artery disease, DM, HTN, complete heart block, pacemaker presents today status post cardiac arrest.Per chart resuscitation was 37 minutes with paced rhythm followed by return of pulses. Intubated 1/7-1/14. CXR 1/15 Aortic atherosclerosis. Endotracheal and nasogastric tubes have been removed. Stable bibasilar atelectasis or edema is noted with associated pleural effusions. CXR 1/14 CHF with slight interval decrease in interstitial edema. Persistent bibasilar pleural effusions layering posteriorly.      SLP Plan  Continue with current plan of care       Recommendations  Diet recommendations: Dysphagia 2 (fine chop);Nectar-thick liquid Liquids provided via: Cup;No straw Medication Administration: Crushed with puree Supervision: Patient able to self feed;Intermittent supervision to cue for compensatory strategies Compensations: Slow rate;Small sips/bites;Lingual sweep for clearance of pocketing Postural Changes and/or Swallow Maneuvers: Seated upright 90 degrees                Oral Care Recommendations: Oral care BID Follow up  Recommendations: Other (comment)(TBD) SLP Visit Diagnosis: Dysphagia, oropharyngeal phase (R13.12) Plan: Continue with current plan of care                      Houston Siren 08/20/2017, 1:54 PM  Orbie Pyo Colvin Caroli.Ed [employer] [phone]

## 2017-08-20 NOTE — Care Management Note (Signed)
Case Management Note  Patient Details  Name: Malik Rojas MRN: [id number] Date of Birth: March 27, 1932  Subjective/Objective:  Pt admitted s/p witnessed cardiac arrest                 Action/Plan:   PTA from home.  Pt remains on vent and on hypothermia protocol - guarded prognosis   Expected Discharge Date:                  Expected Discharge Plan:  (from home with wife)  In-House Referral:     Discharge planning Services  CM Consult  Post Acute Care Choice:    Choice offered to:     DME Arranged:    DME Agency:     HH Arranged:    HH Agency:     Status of Service:     If discussed at [employer], dates discussed:    Additional Comments: 08/20/2017  Pt refusing SNF as recommended.  Per attending group - palliative will be consulted for GOC and reasonable discharge options.  CM spoke with therapy and informed that pt is refusing SNF.    08/19/17 Pt is now extubated to [place] [facility].  PT eval ordered Maryclare Labrador, RN 08/20/2017, 2:41 PM

## 2017-08-20 NOTE — Progress Notes (Signed)
    Patient briefly seen today, chart reviewed. He seems to continue to be auto diuresing now.  Renal function seems to have plateaued.   Continue to allow for mild hypertension to increase perfusion pressures.  Was seen by palliative care to discuss goals of care.  The patient would like to go home. With renal failure to this extent, we would not pursue any ischemic evaluation.  Would probably consider restarting hydralazine tomorrow if his renal function is improved/creatinine is improving. Continue to hold Lasix.  Does not seem to be actively suffering from heart failure symptoms.  Continue on aspirin statin and beta-blocker.  We will follow along and provide full rounding note tomorrow.  Glenetta Hew, MD

## 2017-08-20 NOTE — Progress Notes (Signed)
[place] for Heparin, coumadin  Indication: DVT  No Known Allergies  Patient Measurements: Height: 5' 10"  (177.8 cm) Weight: 211 lb 10.3 oz (96 kg) IBW/kg (Calculated) : 73  Vital Signs: Temp: 97.7 F (36.5 C) (01/16 0800) Temp Source: Oral (01/16 0800) BP: 110/50 (01/16 1100) Pulse Rate: 61 (01/16 1100)  Labs: Recent Labs    08/18/17 0306  08/19/17 0149 08/19/17 1241 08/19/17 2244 08/20/17 0402  HGB 13.9  --  13.5  --   --  14.6  HCT 42.0  --  41.6  --   --  44.7  PLT 238  --  301  --   --  295  LABPROT  --   --   --   --   --  13.7  INR  --   --   --   --   --  1.06  HEPARINUNFRC  --    < > 1.03* 0.88* 0.33 0.30  CREATININE 3.79*  --  4.37*  --   --  4.43*   < > = values in this interval not displayed.    Estimated Creatinine Clearance: 14.7 mL/min (A) (by C-G formula based on SCr of 4.43 mg/dL (H)).   Medical History: Past Medical History:  Diagnosis Date  . Basal cell carcinoma    "scalp, arms, right face;  some cut, some burndt off"  . CAD (coronary artery disease)    LHC 3/14: Distal left main 10%, LAD 20%, circumflex 10-20%, proximal RCA 20%, distal 40-50%, EF 50-55%.  . Chronic combined systolic and diastolic CHF, NYHA class 4 ([place]) 11/06/2016   Echo 4/18: severe LVH, EF 45-50, global HK, Gr 1 DD, mild AS (mean 9/peak 17), MAC, mod LAE, trivial eff  . Complete heart block (HCC)    Status post Medtronic pacemaker  . High cholesterol   . HTN (hypertension)   . Hx of cardiovascular stress test    a. [place] MV 3/14:  Low risk, EF 36%, apical inf scar, no ischemia, inf-apical HK>> catheterization however demonstrated normal left ventricular function  . Melanoma ([place])    back  . Obesity   . Pneumonia 1940  . Presence of permanent cardiac pacemaker    Medtronic  . Type II diabetes mellitus ([place])   . Urinary frequency   . Urinary incontinence    Assessment: 84 year old male s/p prolonged CPR with CHF, respiratory  failure, AKI on  heparin and coumadin for new DVT (day 2 of overlap) -heparin level is 0.3 on 900 units/hr, INR= 1.02  Goal of Therapy:  Heparin level 0.3-0.7 units/ml Monitor platelets by anticoagulation protocol: Yes   Plan:  -Increase heparin to 950 units/hr -Heparin leveldaily wth CBC daily -Coumadin 42m po today -Daily PT/INR  AHildred Laser Pharm D 08/20/2017 11:40 AM

## 2017-08-21 DIAGNOSIS — N171 Acute kidney failure with acute cortical necrosis: Secondary | ICD-10-CM

## 2017-08-21 DIAGNOSIS — N179 Acute kidney failure, unspecified: Secondary | ICD-10-CM

## 2017-08-21 DIAGNOSIS — Z66 Do not resuscitate: Secondary | ICD-10-CM

## 2017-08-21 LAB — BASIC METABOLIC PANEL
ANION GAP: 17 — AB (ref 5–15)
BUN: 137 mg/dL — ABNORMAL HIGH (ref 6–20)
CHLORIDE: 105 mmol/L (ref 101–111)
CO2: 26 mmol/L (ref 22–32)
CREATININE: 4.1 mg/dL — AB (ref 0.61–1.24)
Calcium: 9.1 mg/dL (ref 8.9–10.3)
GFR calc Af Amer: 14 mL/min — ABNORMAL LOW (ref >60)
GFR calc non Af Amer: 12 mL/min — ABNORMAL LOW (ref >60)
Glucose, Bld: 175 mg/dL — ABNORMAL HIGH (ref 65–99)
POTASSIUM: 3.5 mmol/L (ref 3.5–5.1)
SODIUM: 148 mmol/L — AB (ref 135–145)

## 2017-08-21 LAB — GLUCOSE, CAPILLARY
GLUCOSE-CAPILLARY: 148 mg/dL — AB (ref 65–99)
GLUCOSE-CAPILLARY: 209 mg/dL — AB (ref 65–99)
GLUCOSE-CAPILLARY: 234 mg/dL — AB (ref 65–99)
Glucose-Capillary: 133 mg/dL — ABNORMAL HIGH (ref 65–99)
Glucose-Capillary: 152 mg/dL — ABNORMAL HIGH (ref 65–99)
Glucose-Capillary: 167 mg/dL — ABNORMAL HIGH (ref 65–99)

## 2017-08-21 LAB — HEPARIN LEVEL (UNFRACTIONATED)
HEPARIN UNFRACTIONATED: 0.34 [IU]/mL (ref 0.30–0.70)
Heparin Unfractionated: 0.25 IU/mL — ABNORMAL LOW (ref 0.30–0.70)

## 2017-08-21 LAB — MAGNESIUM: MAGNESIUM: 2.4 mg/dL (ref 1.7–2.4)

## 2017-08-21 LAB — CBC WITH DIFFERENTIAL/PLATELET
BASOS PCT: 0 %
Basophils Absolute: 0 10*3/uL (ref 0.0–0.1)
Eosinophils Absolute: 0.2 10*3/uL (ref 0.0–0.7)
Eosinophils Relative: 2 %
HEMATOCRIT: 46 % (ref 39.0–52.0)
HEMOGLOBIN: 14.9 g/dL (ref 13.0–17.0)
LYMPHS ABS: 1.4 10*3/uL (ref 0.7–4.0)
Lymphocytes Relative: 11 %
MCH: 31.7 pg (ref 26.0–34.0)
MCHC: 32.4 g/dL (ref 30.0–36.0)
MCV: 97.9 fL (ref 78.0–100.0)
MONOS PCT: 5 %
Monocytes Absolute: 0.6 10*3/uL (ref 0.1–1.0)
NEUTROS ABS: 10.1 10*3/uL — AB (ref 1.7–7.7)
NEUTROS PCT: 82 %
Platelets: 317 10*3/uL (ref 150–400)
RBC: 4.7 MIL/uL (ref 4.22–5.81)
RDW: 14.7 % (ref 11.5–15.5)
WBC: 12.4 10*3/uL — AB (ref 4.0–10.5)

## 2017-08-21 LAB — PHOSPHORUS: Phosphorus: 5.9 mg/dL — ABNORMAL HIGH (ref 2.5–4.6)

## 2017-08-21 LAB — PROTIME-INR
INR: 1.17
PROTHROMBIN TIME: 14.9 s (ref 11.4–15.2)

## 2017-08-21 LAB — MRSA PCR SCREENING: MRSA BY PCR: NEGATIVE

## 2017-08-21 MED ORDER — WARFARIN SODIUM 5 MG PO TABS
5.0000 mg | ORAL_TABLET | Freq: Once | ORAL | Status: AC
  Administered 2017-08-21: 5 mg via ORAL
  Filled 2017-08-21: qty 1

## 2017-08-21 MED ORDER — INSULIN ASPART 100 UNIT/ML ~~LOC~~ SOLN
2.0000 [IU] | Freq: Three times a day (TID) | SUBCUTANEOUS | Status: DC
  Administered 2017-08-22 (×2): 6 [IU] via SUBCUTANEOUS

## 2017-08-21 MED ORDER — HYDRALAZINE HCL 25 MG PO TABS
25.0000 mg | ORAL_TABLET | Freq: Three times a day (TID) | ORAL | Status: DC
  Administered 2017-08-22 – 2017-08-26 (×12): 25 mg via ORAL
  Filled 2017-08-21 (×12): qty 1

## 2017-08-21 NOTE — Progress Notes (Signed)
Progress Note  Patient Name: Malik Rojas Date of Encounter: 08/21/2017  Primary Cardiologist: Dr. Caryl Comes  Subjective   He seems to be doing relatively well, breathing comfortably.  Was transferred to telemetry yesterday.  He is still somewhat confused and difficult to understand however. Remains very weak. Continues to have decent urine output with what appears to be plateaued renal function with creatinine finally start to come down.  Inpatient Medications    Scheduled Meds: . aspirin  81 mg Per Tube Daily  . carvedilol  25 mg Per Tube BID  . [START ON 08/22/2017] insulin aspart  2-6 Units Subcutaneous TID WC  . insulin glargine  10 Units Subcutaneous BID  . isosorbide dinitrate  10 mg Oral TID  . mouth rinse  15 mL Mouth Rinse q12n4p  . pantoprazole  40 mg Oral QHS  . pravastatin  80 mg Per Tube q1800  . Warfarin - Pharmacist Dosing Inpatient   Does not apply q1800   Continuous Infusions: . heparin 1,150 Units/hr (08/21/17 1749)   PRN Meds: albuterol, artificial tears, fentaNYL (SUBLIMAZE) injection, hydrALAZINE, Influenza vac split quadrivalent PF, pneumococcal 23 valent vaccine, RESOURCE THICKENUP CLEAR   Vital Signs    Vitals:   08/21/17 0407 08/21/17 0700 08/21/17 1214 08/21/17 1956  BP: (!) 158/65  (!) 111/51 (!) 161/60  Pulse: 69  61 67  Resp: (!) 22  20 20   Temp: (!) 97.5 F (36.4 C)  97.6 F (36.4 C) 97.9 F (36.6 C)  TempSrc: Oral  Oral Oral  SpO2: 93%  94% 94%  Weight: 207 lb 4.8 oz (94 kg) 207 lb 3.7 oz (94 kg)    Height:  5\' 10"  (1.778 m)      Intake/Output Summary (Last 24 hours) at 08/21/2017 2242 Last data filed at 08/21/2017 2217 Gross per 24 hour  Intake 240 ml  Output 700 ml  Net -460 ml   Filed Weights   08/20/17 2244 08/21/17 0407 08/21/17 0700  Weight: 208 lb 6.4 oz (94.5 kg) 207 lb 4.8 oz (94 kg) 207 lb 3.7 oz (94 kg)    Physical Exam   General: Well developed, well nourished, NAD; still somewhat confused Skin: Warm,  dry, intact  Head: [place]/AT/EOMI Neck: Negative for carotid bruits. No JVD  Lungs: Not labored.  Diminished basal sounds.  No rales or rhonchi. Cardiovascular: RRR, normal S1-S2.  No obvious M/R/G. Abdomen: Soft/NT/ND/NABS Extremities: Trivial edema.  No clubbing or cyanosis.  Palpable pulses. Neuro: He is awake and alert.  He is oriented to self, and family, knows is in the hospital, but no indication that he knows what date and time and is.    Labs    Chemistry Recent Labs  Lab 08/19/17 0149 08/20/17 0402 08/21/17 0614  NA 148* 149* 148*  K 4.4 4.3 3.5  CL 107 106 105  CO2 26 27 26   GLUCOSE 152* 151* 175*  BUN 121* 137* 137*  CREATININE 4.37* 4.43* 4.10*  CALCIUM 8.7* 9.3 9.1  GFRNONAA 11* 11* 12*  GFRAA 13* 13* 14*  ANIONGAP 15 16* 17*     Hematology Recent Labs  Lab 08/19/17 0149 08/20/17 0402 08/21/17 0614  WBC 14.5* 15.0* 12.4*  RBC 4.36 4.58 4.70  HGB 13.5 14.6 14.9  HCT 41.6 44.7 46.0  MCV 95.4 97.6 97.9  MCH 31.0 31.9 31.7  MCHC 32.5 32.7 32.4  RDW 14.9 14.9 14.7  PLT 301 295 317    Cardiac EnzymesNo results for input(s): TROPONINI in  the last 168 hours. No results for input(s): TROPIPOC in the last 168 hours.   BNPNo results for input(s): BNP, PROBNP in the last 168 hours.   DDimer No results for input(s): DDIMER in the last 168 hours.   Radiology    Dg Chest Port 1 View  Result Date: 08/20/2017 CLINICAL DATA:  Respiratory failure. EXAM: PORTABLE CHEST 1 VIEW COMPARISON:  Radiograph of August 19, 2017. FINDINGS: Stable cardiomegaly. Left-sided pacemaker is unchanged in position. No pneumothorax is noted. Mild left basilar subsegmental atelectasis is noted. Stable bibasilar atelectasis or edema is noted with associated pleural effusions, right greater than left. Bony thorax is unremarkable. IMPRESSION: Stable bibasilar atelectasis or edema is noted with associated pleural effusions, right greater than left. Electronically Signed   By: Marijo Conception,  M.D.   On: 08/20/2017 09:24    Telemetry    Remains in sinus rhythm with intermittent V and AV pacing- Personally Reviewed  ECG    No new- Personally Reviewed  Cardiac Studies   Echo 08/12/17:  Study Conclusions  - Left ventricle: Diffuse hypokinesis mid and basal akinesis The cavity size was moderately dilated. Wall thickness was increased in a pattern of severe LVH. Systolic function was severely reduced. The estimated ejection fraction was in the range of 25% to 30%. Diffuse hypokinesis. - Aortic valve: There was mild regurgitation. - Mitral valve: Severely calcified annulus. Moderately thickened leaflets . There was mild regurgitation. - Left atrium: The atrium was mildly dilated.  Patient Profile     84 y.o. male with PMH of NICM, CHB s/p PPM, non-obstructive CAD per cath report 2014, HTN, DM, HL who presented with sudden cardiac arrest on 08/11/17. EF found to be 25-30% with diffuse hypokinesis.   Assessment & Plan    1. Cardiac arrest: -Prolonged CPR with initial  rhythm PEA vs VF>>> hypothermia protocol. Pt extubated yesterday 08/18/17. CXR revealed pulmonary edema>> lasix was increased to 80mg  IV Q6H>>> with progressively increasing creatinine, finally diuretics were held.  Continue to be held. --He has a history of nonischemic cardiomyopathy in the past that resolved.  He had nonobstructive CAD back in 2014.  Dr. Burt Knack on initial evaluation he did not feel that this was likely to have been an acute ischemic event.  He felt like it sounded more of a initial respiratory event leading to cardiorespiratory arrest. At that time there is no indication for urgent cardiac catheterization.  At this point, with creatinine >4.0, he remains a very poor candidate for any invasive cardiac evaluation. --Despite having very prolonged cardiac arrest and CPR he only had minimal troponin elevation which would again argue against an ischemic event. ->  At this point I do not  think he would be an candidate for cardiac catheterization anytime soon.  We also need to clarify once he becomes more lucid from a neurologic standpoint, if he actually would want to have any invasive procedures done.  He continues to state he wants to go home. -ASA, statin, BB --no antiarrhythmic, as the initial rhythm appeared to be PEA.   2. Acute on chronic combined systolic and diastolic heart failure: Likely nonischemic cardiomyopathy (cannot exclude stress-induced cardia myopathy from his cardiac arrest event) -History of NICM with decreased EF 25-30% that had resolved, but now back down to the 25-30% range on echo. -Pulmonary edema on CXR 08/18/17>> seems to be relatively euvolemic on exam.  Holding diuretic and allowing for renal perfusion.   (He is -15.6 L for the hospitalization --continue to  hold diuretic.) -Cr elevated today, seems to have plateaued and is coming down. -Weight, seems to have plateaued also at 207 lb. -He continues to be net negative despite not being on diuretic.  This would indicate auto diuresis.  Need to monitor for brisk diuresis and changing electrolytes -Remains on high-dose carvedilol along with isosorbide dinitrate.  I suspect by tomorrow if he continues to be doing well we can restart hydralazine.  No ACE inhibitor or ARB because of  3. Respiratory failure: Seems to have resolved.  Extubated and doing well.  He does have pleural effusions, but I do not think that we would successfully diuresed these off.  4. AKI III: --Creatinine again seems to have plateaued and now coming down.  Maintaining urine output.  Monitor for diuresis. -Follow electrolytes closely, replete as necessary. -Allowing for mild permissive hypertension to provide renal perfusion.  5. HTN: Still remained stable.  Not too high.  Somewhat labile. -BB, nitrate --restart hydralazine tomorrow  Palliative care team has been involved. We need to clarify, that we are not to, hospice we are  talking about simply understanding goals of care and understanding realistic goals.  Also respecting the patient's wishes.  He has made it clear on several occasions, that he simply just wants to go home.  Obviously we cannot discharge him home with a creatinine of 4 unless it is with plan for hospice and palliative care.  Therefore we need to continue to monitor him.  I do agree that inpatient rehab would probably be a good idea which would allow [place] to follow his renal function.  At this point from a cardiac standpoint he seems euvolemic.  We will adjust his cardiomyopathy medications as best we can to still allow for renal perfusion.  As we are not actively making aggressive changes, we will follow peripherally over the weekend.  No plans for invasive cardiac evaluation.  At this point I would not entertain discussion of ICD as he is medically that he is DNR and the inciting event appears to have probably been respiratory as opposed to cardiac.  Consider checking limited echo tomorrow to reassess EF, as he clearly appears to be doing better from a cardiopulmonary standpoint.   Signed, Glenetta Hew, MD    08/21/2017, 1300   Pager: [phone]  For questions or updates, please contact   Please consult [email or web address] for contact info under Cardiology/STEMI.

## 2017-08-21 NOTE — Progress Notes (Signed)
[place] for Heparin, coumadin  Indication: DVT  No Known Allergies  Patient Measurements: Height: _0  (177.8 cm) Weight: 207 lb 4.8 oz (94 kg) IBW/kg (Calculated) : 73  HDWt: 92.1 kg  Vital Signs: Temp: 97.5 F (36.4 C) (01/17 0407) Temp Source: Oral (01/17 0407) BP: 158/65 (01/17 0407) Pulse Rate: 69 (01/17 0407)  Labs: Recent Labs    08/19/17 0149  08/19/17 2244 08/20/17 0402 08/21/17 0614  HGB 13.5  --   --  14.6 14.9  HCT 41.6  --   --  44.7 46.0  PLT 301  --   --  295 317  LABPROT  --   --   --  13.7 14.9  INR  --   --   --  1.06 1.17  HEPARINUNFRC 1.03*   < > 0.33 0.30 0.25*  CREATININE 4.37*  --   --  4.43* 4.10*   < > = values in this interval not displayed.    Estimated Creatinine Clearance: 15.7 mL/min (A) (by C-G formula based on SCr of 4.1 mg/dL (H)).   Medical History: Past Medical History:  Diagnosis Date  . Basal cell carcinoma    "scalp, arms, right face;  some cut, some burndt off"  . CAD (coronary artery disease)    LHC 3/14: Distal left main 10%, LAD 20%, circumflex 10-20%, proximal RCA 20%, distal 40-50%, EF 50-55%.  . Chronic combined systolic and diastolic CHF, NYHA class 4 ([place]) 11/06/2016   Echo 4/18: severe LVH, EF 45-50, global HK, Gr 1 DD, mild AS (mean 9/peak 17), MAC, mod LAE, trivial eff  . Complete heart block (HCC)    Status post [employer] pacemaker  . High cholesterol   . HTN (hypertension)   . Hx of cardiovascular stress test    a. [place] MV 3/14:  Low risk, EF 36%, apical inf scar, no ischemia, inf-apical HK>> catheterization however demonstrated normal left ventricular function  . Melanoma ([place])    back  . Obesity   . Pneumonia 1940  . Presence of permanent cardiac pacemaker    [employer]tronic  . Type II diabetes mellitus ([place])   . Urinary frequency   . Urinary incontinence    Assessment: 84 year old male s/p prolonged CPR with CHF, respiratory failure, AKI continuing on heparin  and coumadin for new DVT (day 3 of overlap).  Heparin level is slightly low at 0.25 on 950 units/hr, INR trend up to 1.17. CBC wnl. No bleed or IV line issues per RN and drip has not been off at all.  Goal of Therapy:  Heparin level 0.3-0.7 units/ml Monitor platelets by anticoagulation protocol: Yes   Plan:  -Increase heparin to 1150 units/hr -Coumadin 76m PO x 1 again tonight -8h heparin level -Daily heparin level/INR/CBC -Monitor for s/sx bleeding  HElicia Lamp PharmD, BCPS Clinical Pharmacist Clinical phone for 08/21/2017 until 3:30pm: [phone] after 3:30pm, please call main pharmacy at: [phone] 08/21/2017 8:33 AM

## 2017-08-21 NOTE — Progress Notes (Cosign Needed)
PROGRESS NOTE    Malik Rojas  FWY:[id number] DOB: [birth year] DOA: 08/11/2017 PCP: Denita Lung, MD   Brief Narrative:  84 y/o male presents to ED on 08/11/17 with cardiac arrest, has medical history significant for coronary artery disease, chronic combined systolic and diastolic CHF, hyperlipidemia, hypertension, permanent pacemaker.  CPR was performed for 37 minutes prior to [place], and continued to be unresponsive and non-purposeful state post ROSC. Initial physical examinations BP 163/72, pulse 60, temp 96.17F, respiratory 16, SpO2 99% with PRVC, unresponsive, PEERL, unlabored and bilateral breathing.  Patient was intubated, sedated, paralyzed, and has been initiated on therapeutic hypothermia protocol. Head CT ordered and home BP medications (carvedilol, furosemide, lisinopril) restarted.  Patient awake on 08/14/17 but remained intubated, unable to provide history and follow simple commands.  On 08/18/17, patient remained intubated, but weaning, able to respond to simple commands, and was extubated on the same day. Per cardiology, patient does not want aggressive intervention.  Patient's condition improved and was transferred to telemetry for further evaluation.    Assessment & Plan:    Principal Problem:   Cardiac arrest ([place]) Active Problems:   Hypertensive heart disease with CHF (congestive heart failure) (HCC)   Diabetes mellitus due to underlying condition with diabetic nephropathy, without long-term current use of insulin (HCC)   Acute on chronic combined systolic and diastolic CHF, NYHA class 3 (HCC)   Acute hypercapnic respiratory failure (HCC)   Uncontrolled hypertension   Acute pulmonary edema (HCC)   Acute renal failure superimposed on stage 3 chronic kidney disease ([place])   Respiratory failure ([place])   Palliative care by specialist   DNR (do not resuscitate)   Weakness generalized  1. Acute hypoxic respiratory failure - No clinical sign of decompensation, patient denies  chest pain and SOB. Continue albuterol, O2 as needed.  2. Acute on chronic combined systolic and diastolic heart failure - No clinical sign of decompensation, patient denies chest pain and SOB. Will continue isosorbide dinitrate, carvedilol.  F/u with cardiology. Continue to hold Lasix  3. Renal failure due to ATN - BUN 82,105,121,137,137; Creatine 3.86, 3.79, 4.37, 4.43, 4.1.  Tolerate well by mouth, speech pathology has been working with patient on swallow maneuvers. Advised to stay hydrated.  Will order BMP to continue monitor renal function, and continue antihypertensive medications to monitor BP.   4. Hypertension  - Will continue antihypertensive medications to monitor BP.  Continue pravastatin, hydralazine, carvedilol, aspirin.  5. Diabetes mellitus type 2 - Will continue insulin sliding scale for glucose cover and monitoring, capillary blood glucose 129, 133, 167, 148, 209.   DVT prophylaxis: Heparin Code Status: DNR Family Communication: Family at bedside Disposition Plan: Likely transfer to inpatient rehab   Consultants:     Procedures: Antimicrobials:  Subjective: Patient appears calm, alert, and oriented.  Patient denies fever, dizziness, chest pain, difficulty breathing, cough, abdominal pain. Patient's family report he's making good progress.    Objective: Vitals:   08/20/17 2244 08/21/17 0028 08/21/17 0407 08/21/17 0700  BP: (!) 112/48 (!) 125/49 (!) 158/65   Pulse: 60 61 69   Resp: (!) 24 (!) 22 (!) 22   Temp: 97.9 F (36.6 C) (!) 97.5 F (36.4 C) (!) 97.5 F (36.4 C)   TempSrc: Oral Oral Oral   SpO2: 93% 93% 93%   Weight: 94.5 kg (208 lb 6.4 oz)  94 kg (207 lb 4.8 oz) 94 kg (207 lb 3.7 oz)  Height:    _0  (1.778 m)  Intake/Output Summary (Last 24 hours) at 08/21/2017 1134 Last data filed at 08/21/2017 0954 Gross per 24 hour  Intake 563.37 ml  Output 575 ml  Net -11.63 ml   Filed Weights   08/20/17 2244 08/21/17 0407 08/21/17 0700  Weight:  94.5 kg (208 lb 6.4 oz) 94 kg (207 lb 4.8 oz) 94 kg (207 lb 3.7 oz)    Examination:  General exam: Appears calm and comfortable  Respiratory system: Clear to auscultation. Respiratory effort normal. No wheezing or crackle Cardiovascular system: S1 & S2 heard, RRR.  No pedal edema. Gastrointestinal system: Abdomen is nondistended, soft and nontender. Normal bowel sounds heard. Central nervous system: Alert and oriented. No focal neurological deficits. Skin: No rashes, lesions or ulcers   Data Reviewed: I have personally reviewed following labs and imaging studies  CBC: Recent Labs  Lab 08/17/17 0250 08/18/17 0306 08/19/17 0149 08/20/17 0402 08/21/17 0614  WBC 10.5 10.5 14.5* 15.0* 12.4*  NEUTROABS  --   --  11.2* 12.3* 10.1*  HGB 13.1 13.9 13.5 14.6 14.9  HCT 39.0 42.0 41.6 44.7 46.0  MCV 94.0 92.9 95.4 97.6 97.9  PLT 209 238 301 295 893   Basic Metabolic Panel: Recent Labs  Lab 08/17/17 0250 08/18/17 0306 08/19/17 0149 08/20/17 0402 08/21/17 0614  NA 144 145 148* 149* 148*  K 3.7 3.4* 4.4 4.3 3.5  CL 109 107 107 106 105  CO2 _0 GLUCOSE 305* 276* 152* 151* 175*  BUN 82* 105* 121* 137* 137*  CREATININE 3.86* 3.79* 4.37* 4.43* 4.10*  CALCIUM 8.6* 8.9 8.7* 9.3 9.1  MG 2.2 2.2 2.3 2.5* 2.4  PHOS 3.8 4.1 6.6* 6.8* 5.9*   GFR: Estimated Creatinine Clearance: 15.7 mL/min (A) (by C-G formula based on SCr of 4.1 mg/dL (H)). Liver Function Tests: No results for input(s): AST, ALT, ALKPHOS, BILITOT, PROT, ALBUMIN in the last 168 hours. No results for input(s): LIPASE, AMYLASE in the last 168 hours. No results for input(s): AMMONIA in the last 168 hours. Coagulation Profile: Recent Labs  Lab 08/20/17 0402 08/21/17 0614  INR 1.06 1.17   Cardiac Enzymes: No results for input(s): CKTOTAL, CKMB, CKMBINDEX, TROPONINI in the last 168 hours. BNP (last 3 results) No results for input(s): PROBNP in the last 8760 hours. HbA1C: No results for input(s): HGBA1C  in the last 72 hours. CBG: Recent Labs  Lab 08/20/17 1651 08/20/17 1943 08/21/17 0026 08/21/17 0405 08/21/17 0748  GLUCAP 218* 129* 133* 167* 148*   Lipid Profile: No results for input(s): CHOL, HDL, LDLCALC, TRIG, CHOLHDL, LDLDIRECT in the last 72 hours. Thyroid Function Tests: No results for input(s): TSH, T4TOTAL, FREET4, T3FREE, THYROIDAB in the last 72 hours. Anemia Panel: No results for input(s): VITAMINB12, FOLATE, FERRITIN, TIBC, IRON, RETICCTPCT in the last 72 hours. Sepsis Labs: No results for input(s): PROCALCITON, LATICACIDVEN in the last 168 hours.  Recent Results (from the past 240 hour(s))  Blood culture (routine x 2)     Status: None   Collection Time: 08/11/17  2:53 PM  Result Value Ref Range Status   Specimen Description BLOOD LEFT FOREARM  Final   Special Requests   Final    BOTTLES DRAWN AEROBIC AND ANAEROBIC Blood Culture results may not be optimal due to an inadequate volume of blood received in culture bottles   Culture NO GROWTH 5 DAYS  Final   Report Status 08/16/2017 FINAL  Final  Blood culture (routine x 2)     Status: Abnormal  Collection Time: 08/11/17  2:56 PM  Result Value Ref Range Status   Specimen Description BLOOD RIGHT FOREARM  Final   Special Requests   Final    BOTTLES DRAWN AEROBIC AND ANAEROBIC Blood Culture adequate volume   Culture  Setup Time   Final    GRAM POSITIVE COCCI IN BOTH AEROBIC AND ANAEROBIC BOTTLES CRITICAL RESULT CALLED TO, READ BACK BY AND VERIFIED WITH: Alvino Chapel.D. 13:35 08/12/17 ([email or web address])    Culture (A)  Final    STAPHYLOCOCCUS SPECIES (COAGULASE NEGATIVE) THE SIGNIFICANCE OF ISOLATING THIS ORGANISM FROM A SINGLE SET OF BLOOD CULTURES WHEN MULTIPLE SETS ARE DRAWN IS UNCERTAIN. PLEASE NOTIFY THE MICROBIOLOGY DEPARTMENT WITHIN ONE WEEK IF SPECIATION AND SENSITIVITIES ARE REQUIRED.    Report Status 08/14/2017 FINAL  Final  Blood Culture ID Panel (Reflexed)     Status: Abnormal   Collection Time: 08/11/17  2:56  PM  Result Value Ref Range Status   Enterococcus species NOT DETECTED NOT DETECTED Final   Listeria monocytogenes NOT DETECTED NOT DETECTED Final   Staphylococcus species DETECTED (A) NOT DETECTED Final    Comment: Methicillin (oxacillin) resistant coagulase negative staphylococcus. Possible blood culture contaminant (unless isolated from more than one blood culture draw or clinical case suggests pathogenicity). No antibiotic treatment is indicated for blood  culture contaminants. CRITICAL RESULT CALLED TO, READ BACK BY AND VERIFIED WITH: Alvino Chapel.D. 13:35 08/12/17 ([email or web address])    Staphylococcus aureus NOT DETECTED NOT DETECTED Final   Methicillin resistance DETECTED (A) NOT DETECTED Final    Comment: CRITICAL RESULT CALLED TO, READ BACK BY AND VERIFIED WITH: EDaisy Lazar.D. 13:35 08/12/17 ([email or web address])    Streptococcus species NOT DETECTED NOT DETECTED Final   Streptococcus agalactiae NOT DETECTED NOT DETECTED Final   Streptococcus pneumoniae NOT DETECTED NOT DETECTED Final   Streptococcus pyogenes NOT DETECTED NOT DETECTED Final   Acinetobacter baumannii NOT DETECTED NOT DETECTED Final   Enterobacteriaceae species NOT DETECTED NOT DETECTED Final   Enterobacter cloacae complex NOT DETECTED NOT DETECTED Final   Escherichia coli NOT DETECTED NOT DETECTED Final   Klebsiella oxytoca NOT DETECTED NOT DETECTED Final   Klebsiella pneumoniae NOT DETECTED NOT DETECTED Final   Proteus species NOT DETECTED NOT DETECTED Final   Serratia marcescens NOT DETECTED NOT DETECTED Final   Haemophilus influenzae NOT DETECTED NOT DETECTED Final   Neisseria meningitidis NOT DETECTED NOT DETECTED Final   Pseudomonas aeruginosa NOT DETECTED NOT DETECTED Final   Candida albicans NOT DETECTED NOT DETECTED Final   Candida glabrata NOT DETECTED NOT DETECTED Final   Candida krusei NOT DETECTED NOT DETECTED Final   Candida parapsilosis NOT DETECTED NOT DETECTED Final   Candida tropicalis NOT DETECTED NOT DETECTED  Final  MRSA PCR Screening     Status: None   Collection Time: 08/11/17  5:57 PM  Result Value Ref Range Status   MRSA by PCR NEGATIVE NEGATIVE Final    Comment:        The GeneXpert MRSA Assay (FDA approved for NASAL specimens only), is one component of a comprehensive MRSA colonization surveillance program. It is not intended to diagnose MRSA infection nor to guide or monitor treatment for MRSA infections.   Culture, blood (Routine X 2) w Reflex to ID Panel     Status: None   Collection Time: 08/14/17  1:15 PM  Result Value Ref Range Status   Specimen Description BLOOD LEFT ANTECUBITAL  Final   Special Requests   Final    BOTTLES DRAWN AEROBIC AND ANAEROBIC  Blood Culture adequate volume   Culture NO GROWTH 5 DAYS  Final   Report Status 08/19/2017 FINAL  Final  Culture, blood (Routine X 2) w Reflex to ID Panel     Status: None   Collection Time: 08/14/17  1:30 PM  Result Value Ref Range Status   Specimen Description BLOOD LEFT FOREARM  Final   Special Requests   Final    BOTTLES DRAWN AEROBIC AND ANAEROBIC Blood Culture adequate volume   Culture NO GROWTH 5 DAYS  Final   Report Status 08/19/2017 FINAL  Final  MRSA PCR Screening     Status: None   Collection Time: 08/20/17 11:55 PM  Result Value Ref Range Status   MRSA by PCR NEGATIVE NEGATIVE Final    Comment:        The GeneXpert MRSA Assay (FDA approved for NASAL specimens only), is one component of a comprehensive MRSA colonization surveillance program. It is not intended to diagnose MRSA infection nor to guide or monitor treatment for MRSA infections.          Radiology Studies: Dg Chest Port 1 View  Result Date: 08/20/2017 CLINICAL DATA:  Respiratory failure. EXAM: PORTABLE CHEST 1 VIEW COMPARISON:  Radiograph of August 19, 2017. FINDINGS: Stable cardiomegaly. Left-sided pacemaker is unchanged in position. No pneumothorax is noted. Mild left basilar subsegmental atelectasis is noted. Stable bibasilar  atelectasis or edema is noted with associated pleural effusions, right greater than left. Bony thorax is unremarkable. IMPRESSION: Stable bibasilar atelectasis or edema is noted with associated pleural effusions, right greater than left. Electronically Signed   By: Marijo Conception, M.D.   On: 08/20/2017 09:24   Dg Swallowing Func-speech Pathology  Result Date: 08/19/2017 Objective Swallowing Evaluation: Type of Study: MBS-Modified Barium Swallow Study  Patient Details Name: Malik Rojas MRN: [id number] Date of Birth: 09/14/1931 Today's Date: 08/19/2017 Time: SLP Start Time (ACUTE ONLY): 1148 -SLP Stop Time (ACUTE ONLY): 1205 SLP Time Calculation (min) (ACUTE ONLY): 17 min Past Medical History: Past Medical History: Diagnosis Date . Basal cell carcinoma   "scalp, arms, right face;  some cut, some burndt off" . CAD (coronary artery disease)   LHC 3/14: Distal left main 10%, LAD 20%, circumflex 10-20%, proximal RCA 20%, distal 40-50%, EF 50-55%. . Chronic combined systolic and diastolic CHF, NYHA class 4 ([place]) 11/06/2016  Echo 4/18: severe LVH, EF 45-50, global HK, Gr 1 DD, mild AS (mean 9/peak 17), MAC, mod LAE, trivial eff . Complete heart block (HCC)   Status post Medtronic pacemaker . High cholesterol  . HTN (hypertension)  . Hx of cardiovascular stress test   a. [place] MV 3/14:  Low risk, EF 36%, apical inf scar, no ischemia, inf-apical HK>> catheterization however demonstrated normal left ventricular function . Melanoma ([place])   back . Obesity  . Pneumonia 1940 . Presence of permanent cardiac pacemaker   Medtronic . Type II diabetes mellitus ([place])  . Urinary frequency  . Urinary incontinence  Past Surgical History: Past Surgical History: Procedure Laterality Date . APPENDECTOMY  40 years ago . BASAL CELL CARCINOMA EXCISION    "scalp, right face" . INSERT / REPLACE / REMOVE PACEMAKER  ~ 1999  medtronic EnRhythm . MOHS SURGERY Left   ear; being monitored-every 6 months . PERMANENT PACEMAKER GENERATOR CHANGE N/A  02/12/2013  Procedure: PERMANENT PACEMAKER GENERATOR CHANGE;  Surgeon: Deboraha Sprang, MD;  Location[facility] CATH LAB;  Service: Cardiovascular;  Laterality: N/A; . TONSILLECTOMY    as child . TRANSURETHRAL RESECTION  OF PROSTATE N/A 10/13/2014  Procedure: WOLF TRANSURETHRAL RESECTION OF THE PROSTATE (TURP);  Surgeon: Carolan Clines, MD;  Location: [facility] ORS;  Service: Urology;  Laterality: N/A; HPI: This is a 84 year old man history of coronary artery disease, DM, HTN, complete heart block, pacemaker presents today status post cardiac arrest.Per chart resuscitation was 37 minutes with paced rhythm followed by return of pulses. Intubated 1/7-1/14. CXR 1/15 Aortic atherosclerosis. Endotracheal and nasogastric tubes have been removed. Stable bibasilar atelectasis or edema is noted with associated pleural effusions. CXR 1/14 CHF with slight interval decrease in interstitial edema. Persistent bibasilar pleural effusions layering posteriorly.  No Data Recorded Assessment / Plan / Recommendation CHL IP CLINICAL IMPRESSIONS 08/19/2017 Clinical Impression Pt's impairments are primarily related to oral phase with decreased lingual manipulation, lingual residue and decreased labial seal with difficulty transiting liquid into oral cavity. Laryngeal penetration with larger sip nectar and straw mild, above vocal cords. Overall timing of swallow was functional with one episode of trigger at valleculae. Mild vallecular residue due to reduced epiglottic inversion. Primary barriers to safe swallow are pt's suspected incomplete vocal cord adduction indicated by hoarse quality. Recommend nectar thick liquids, Dys 2, crush pills and assist for swallow precautions. Will see while in hospital.   SLP Visit Diagnosis Dysphagia, oropharyngeal phase (R13.12) Attention and concentration deficit following -- Frontal lobe and executive function deficit following -- Impact on safety and function Moderate aspiration risk   CHL IP TREATMENT  RECOMMENDATION 08/19/2017 Treatment Recommendations Therapy as outlined in treatment plan below   Prognosis 08/19/2017 Prognosis for Safe Diet Advancement Good Barriers to Reach Goals Behavior Barriers/Prognosis Comment -- CHL IP DIET RECOMMENDATION 08/19/2017 SLP Diet Recommendations Dysphagia 2 (Fine chop) solids;Nectar thick liquid Liquid Administration via Cup;No straw Medication Administration Crushed with puree Compensations Slow rate;Small sips/bites Postural Changes Seated upright at 90 degrees   CHL IP OTHER RECOMMENDATIONS 08/19/2017 Recommended Consults -- Oral Care Recommendations Oral care BID Other Recommendations --   CHL IP FOLLOW UP RECOMMENDATIONS 08/19/2017 Follow up Recommendations (No Data)   CHL IP FREQUENCY AND DURATION 08/19/2017 Speech Therapy Frequency (ACUTE ONLY) min 2x/week Treatment Duration 2 weeks      CHL IP ORAL PHASE 08/19/2017 Oral Phase Impaired Oral - Pudding Teaspoon -- Oral - Pudding Cup -- Oral - Honey Teaspoon -- Oral - Honey Cup Lingual/palatal residue Oral - Nectar Teaspoon -- Oral - Nectar Cup Lingual/palatal residue Oral - Nectar Straw Lingual/palatal residue Oral - Thin Teaspoon -- Oral - Thin Cup -- Oral - Thin Straw -- Oral - Puree Lingual/palatal residue Oral - Mech Soft Lingual/palatal residue Oral - Regular -- Oral - Multi-Consistency -- Oral - Pill -- Oral Phase - Comment --  CHL IP PHARYNGEAL PHASE 08/19/2017 Pharyngeal Phase Impaired Pharyngeal- Pudding Teaspoon -- Pharyngeal -- Pharyngeal- Pudding Cup -- Pharyngeal -- Pharyngeal- Honey Teaspoon -- Pharyngeal -- Pharyngeal- Honey Cup WFL Pharyngeal -- Pharyngeal- Nectar Teaspoon -- Pharyngeal -- Pharyngeal- Nectar Cup Delayed swallow initiation-vallecula Pharyngeal -- Pharyngeal- Nectar Straw Penetration/Aspiration during swallow Pharyngeal Material enters airway, remains ABOVE vocal cords and not ejected out Pharyngeal- Thin Teaspoon -- Pharyngeal -- Pharyngeal- Thin Cup -- Pharyngeal -- Pharyngeal- Thin Straw --  Pharyngeal -- Pharyngeal- Puree WFL Pharyngeal -- Pharyngeal- Mechanical Soft Pharyngeal residue - valleculae Pharyngeal -- Pharyngeal- Regular -- Pharyngeal -- Pharyngeal- Multi-consistency -- Pharyngeal -- Pharyngeal- Pill -- Pharyngeal -- Pharyngeal Comment --  CHL IP CERVICAL ESOPHAGEAL PHASE 08/19/2017 Cervical Esophageal Phase WFL Pudding Teaspoon -- Pudding Cup -- Honey Teaspoon -- Honey Cup -- Nectar Teaspoon --  Nectar Cup -- Nectar Straw -- Thin Teaspoon -- Thin Cup -- Thin Straw -- Puree -- Mechanical Soft -- Regular -- Multi-consistency -- Pill -- Cervical Esophageal Comment -- No flowsheet data found. [place] Siren 08/19/2017, 1:44 PM  Orbie Pyo Colvin Caroli.Ed CCC-SLP Pager [phone]                  Scheduled Meds: . aspirin  81 mg Per Tube Daily  . carvedilol  25 mg Per Tube BID  . insulin aspart  2-6 Units Subcutaneous Q4H  . insulin glargine  10 Units Subcutaneous BID  . isosorbide dinitrate  10 mg Oral TID  . mouth rinse  15 mL Mouth Rinse q12n4p  . pantoprazole  40 mg Oral QHS  . pravastatin  80 mg Per Tube q1800  . warfarin  5 mg Oral ONCE-1800  . Warfarin - Pharmacist Dosing Inpatient   Does not apply q1800   Continuous Infusions: . heparin 1,150 Units/hr (08/21/17 0916)     LOS: 10 days    Jo-ku Elta [place]) Mervyn Skeeters, PA-S

## 2017-08-21 NOTE — Progress Notes (Signed)
PROGRESS NOTE    Jarquez Mestre  ZCH:[id number] DOB: [birth year] DOA: 08/11/2017 PCP: Denita Lung, MD    Brief Narrative:  84 year old male who was admitted to the intensive care unit 08/11/2017 after sustaining a cardiac arrest, apparently ventricular fibrillation. He received CPR for 37 minutes, with recovery of spontaneous circulation. Patient was intubated, placed on mechanical ventilation, and underwent hypothermic protocol. Here recovered well, extubated 1/14.   His LV function was noted to be decreased, apparently he developed ATN, which seems to be stable.   Patient was successfully transferred to the telemetry unit for further care.   Assessment & Plan:   Principal Problem:   Cardiac arrest ([place]) Active Problems:   Hypertensive heart disease with CHF (congestive heart failure) (HCC)   Diabetes mellitus due to underlying condition with diabetic nephropathy, without long-term current use of insulin (HCC)   Acute on chronic combined systolic and diastolic CHF, NYHA class 3 (HCC)   Acute hypercapnic respiratory failure (HCC)   Uncontrolled hypertension   Acute pulmonary edema (HCC)   Acute renal failure superimposed on stage 3 chronic kidney disease ([place])   Respiratory failure ([place])   Palliative care by specialist   DNR (do not resuscitate)   Weakness generalized   1. Systolic heart failure acute on chronic decompensation. Clinically euvolemic, will continue close monitoring, for now holding on diuretic therapy. Continue coreg, asa, and isosorbide. No ischemia work up due to decreased renal function. Patient not willing to have aggressive therapy.    2. Hypoxic respiratory failure/ acute. Volume status has improved, mild dyspnea, will continue oxymetry monitoring and supplemental 02 per [place].   3. Acute kidney injury/ATN. Stable renal function with serum cr at 4,0. Will follow renal panel in am, avoid hypotension or nephrotoxic medications.   4. DVT right common femoral  vein. Will continue on heparin IV and will continue warfarin therapy for anticoagulation.  5. Type 2 diabetes mellitus. Patient tolerating po well, will continue insulin sliding scale for glucose cover and  Monitoring, patient tolerating po well. Capillary glucose 129, 133, 167, 148, 209. On 10 units bid of glargine.   DVT prophylaxis: heparin  Code Status:  dnr Family Communication: I spoke with patient's family and all questions were addressed Disposition Plan: IR/ SNF   Consultants:   Cardiology   Procedures:     Antimicrobials:       Subjective: Patient very weak and deconditioned, no chest pain, mild dyspnea on exertion, no nausea or vomiting.   Objective: Vitals:   08/21/17 0028 08/21/17 0407 08/21/17 0700 08/21/17 1214  BP: (!) 125/49 (!) 158/65  (!) 111/51  Pulse: 61 69  61  Resp: (!) 22 (!) 22  20  Temp: (!) 97.5 F (36.4 C) (!) 97.5 F (36.4 C)  97.6 F (36.4 C)  TempSrc: Oral Oral  Oral  SpO2: 93% 93%  94%  Weight:  94 kg (207 lb 4.8 oz) 94 kg (207 lb 3.7 oz)   Height:   5\' 10"  (1.778 m)     Intake/Output Summary (Last 24 hours) at 08/21/2017 1456 Last data filed at 08/21/2017 0954 Gross per 24 hour  Intake 316 ml  Output 575 ml  Net -259 ml   Filed Weights   08/20/17 2244 08/21/17 0407 08/21/17 0700  Weight: 94.5 kg (208 lb 6.4 oz) 94 kg (207 lb 4.8 oz) 94 kg (207 lb 3.7 oz)    Examination:   General: Not in pain or dyspnea, deconditioned Neurology: Awake and alert, non  focal  E ENT: mild pallor, no icterus, oral mucosa moist Cardiovascular: No JVD. S1-S2 present, rhythmic, no gallops, rubs, or murmurs. ++ non pitting lower extremity edema. Pulmonary: decreased breath sounds bilaterally at bases, adequate air movement, no wheezing, rhonchi or rales. Gastrointestinal. Abdomen protuberant, no organomegaly, non tender, no rebound or guarding Skin. No rashes Musculoskeletal: no joint deformities     Data Reviewed: I have personally reviewed  following labs and imaging studies  CBC: Recent Labs  Lab 08/17/17 0250 08/18/17 0306 08/19/17 0149 08/20/17 0402 08/21/17 0614  WBC 10.5 10.5 14.5* 15.0* 12.4*  NEUTROABS  --   --  11.2* 12.3* 10.1*  HGB 13.1 13.9 13.5 14.6 14.9  HCT 39.0 42.0 41.6 44.7 46.0  MCV 94.0 92.9 95.4 97.6 97.9  PLT 209 238 301 295 169   Basic Metabolic Panel: Recent Labs  Lab 08/17/17 0250 08/18/17 0306 08/19/17 0149 08/20/17 0402 08/21/17 0614  NA 144 145 148* 149* 148*  K 3.7 3.4* 4.4 4.3 3.5  CL 109 107 107 106 105  CO2 22 23 26 27 26   GLUCOSE 305* 276* 152* 151* 175*  BUN 82* 105* 121* 137* 137*  CREATININE 3.86* 3.79* 4.37* 4.43* 4.10*  CALCIUM 8.6* 8.9 8.7* 9.3 9.1  MG 2.2 2.2 2.3 2.5* 2.4  PHOS 3.8 4.1 6.6* 6.8* 5.9*   GFR: Estimated Creatinine Clearance: 15.7 mL/min (A) (by C-G formula based on SCr of 4.1 mg/dL (H)). Liver Function Tests: No results for input(s): AST, ALT, ALKPHOS, BILITOT, PROT, ALBUMIN in the last 168 hours. No results for input(s): LIPASE, AMYLASE in the last 168 hours. No results for input(s): AMMONIA in the last 168 hours. Coagulation Profile: Recent Labs  Lab 08/20/17 0402 08/21/17 0614  INR 1.06 1.17   Cardiac Enzymes: No results for input(s): CKTOTAL, CKMB, CKMBINDEX, TROPONINI in the last 168 hours. BNP (last 3 results) No results for input(s): PROBNP in the last 8760 hours. HbA1C: No results for input(s): HGBA1C in the last 72 hours. CBG: Recent Labs  Lab 08/20/17 1943 08/21/17 0026 08/21/17 0405 08/21/17 0748 08/21/17 1150  GLUCAP 129* 133* 167* 148* 209*   Lipid Profile: No results for input(s): CHOL, HDL, LDLCALC, TRIG, CHOLHDL, LDLDIRECT in the last 72 hours. Thyroid Function Tests: No results for input(s): TSH, T4TOTAL, FREET4, T3FREE, THYROIDAB in the last 72 hours. Anemia Panel: No results for input(s): VITAMINB12, FOLATE, FERRITIN, TIBC, IRON, RETICCTPCT in the last 72 hours.    Radiology Studies: I have reviewed all of  the imaging during this hospital visit personally     Scheduled Meds: . aspirin  81 mg Per Tube Daily  . carvedilol  25 mg Per Tube BID  . insulin aspart  2-6 Units Subcutaneous Q4H  . insulin glargine  10 Units Subcutaneous BID  . isosorbide dinitrate  10 mg Oral TID  . mouth rinse  15 mL Mouth Rinse q12n4p  . pantoprazole  40 mg Oral QHS  . pravastatin  80 mg Per Tube q1800  . warfarin  5 mg Oral ONCE-1800  . Warfarin - Pharmacist Dosing Inpatient   Does not apply q1800   Continuous Infusions: . heparin 1,150 Units/hr (08/21/17 0916)     LOS: 10 days        Mauricio Gerome Apley, MD [facility] Pager [phone]

## 2017-08-21 NOTE — Progress Notes (Signed)
Physical Therapy Treatment Patient Details Name: Malik Rojas MRN: [id number] DOB: March 21, 1932 Today's Date: 08/21/2017    History of Present Illness Pt is a 84 yo male admitted 1/7 s/p cardiac arrest at home. downtime was 37 minutes. PMH: baseal cell carcinoma, CAG, CH, HTN, high cholesterol    PT Comments    Pt performed transfer back to bed after staff unable to assist patient back to bed.  PTA entered to assist patient upon request from nursing staff.     Follow Up Recommendations  CIR;SNF(SNF vs CIR)     Equipment Recommendations  None recommended by PT    Recommendations for Other Services       Precautions / Restrictions Precautions Precautions: Fall Restrictions Weight Bearing Restrictions: No    Mobility  Bed Mobility Overal bed mobility: Needs Assistance Bed Mobility: Sit to Supine       Sit to supine: Mod assist   General bed mobility comments: Pt required assistance to lower trunk and to lift B LEs into bed against gravity.  Pt required cues for scooting to head of bed.  Bed placed in trendlenberg position to improve ease during scooting.    Transfers Overall transfer level: Needs assistance Equipment used: (sara stedy) Transfers: Sit to/from Stand Sit to Stand: Max assist(+1 assistance for back to bed transfer)         General transfer comment: Pt remains to require cues for hand placement, patient followed commands to pull into standing and required assistance to boost and extend B hips into standing.  Pt with slight flexed posture in standing.  Stedy plates moved behind patient and he was able to sit for transfer back to bed.  From elevated height of sara stedy plates, patient able to stand with mod assistance +1.    Ambulation/Gait                 Stairs            Wheelchair Mobility    Modified Rankin (Stroke Patients Only)       Balance     Sitting balance-Leahy Scale: Poor       Standing balance-Leahy Scale:  Poor                              Cognition Arousal/Alertness: Awake/alert Behavior During Therapy: WFL for tasks assessed/performed Overall Cognitive Status: Impaired/Different from baseline Area of Impairment: Following commands;Safety/judgement;Problem solving                       Following Commands: Follows one step commands with increased time Safety/Judgement: Decreased awareness of deficits   Problem Solving: Slow processing;Difficulty sequencing;Requires verbal cues;Requires tactile cues General Comments: increased time       Exercises      General Comments        Pertinent Vitals/Pain Pain Assessment: No/denies pain    Home Living                      Prior Function            PT Goals (current goals can now be found in the care plan section) Acute Rehab PT Goals Patient Stated Goal: home Potential to Achieve Goals: Good Progress towards PT goals: Progressing toward goals    Frequency    Min 3X/week      PT Plan Current plan remains appropriate    Co-evaluation  AM-PAC PT "6 Clicks" Daily Activity  Outcome Measure  Difficulty turning over in bed (including adjusting bedclothes, sheets and blankets)?: Unable Difficulty moving from lying on back to sitting on the side of the bed? : Unable Difficulty sitting down on and standing up from a chair with arms (e.g., wheelchair, bedside commode, etc,.)?: Unable Help needed moving to and from a bed to chair (including a wheelchair)?: A Lot Help needed walking in hospital room?: Total Help needed climbing 3-5 steps with a railing? : Total 6 Click Score: 7    End of Session Equipment Utilized During Treatment: Gait belt Activity Tolerance: Patient tolerated treatment well Patient left: with call bell/phone within reach;with nursing/sitter in room;in bed;with bed alarm set Nurse Communication: Mobility status;Need for lift equipment(use of sara stedy for  transfers) PT Visit Diagnosis: Unsteadiness on feet (R26.81);Muscle weakness (generalized) (M62.81)     Time: 7903-8333 PT Time Calculation (min) (ACUTE ONLY): 17 min  Charges:  $Therapeutic Activity: 8-22 mins                    G Codes:       Governor Rooks, PTA pager [phone]    Cristela Blue 08/21/2017, 6:17 PM

## 2017-08-21 NOTE — Consult Note (Signed)
   [facility] CM Inpatient Consult   08/21/2017  Mayank Teuscher [facility] 08-Jun-1932 [id number]  Chart reviewed for [facility] Care Management services in the [place]. Patient admitted on 08/11/17 with a witnessed cardiac arrest, intubated and extubated, transferred to telemetry floor.  Chart review reveals the patient has been recommended for skilled nursing facility but desires to go home.  Palliative Care consult reviewed, will follow.  Patient family pursuing inpatient rehab.  For questions, please contact:  Natividad Brood, RN BSN [facility] Liaison  [phone] business mobile phone Toll free office [phone]

## 2017-08-21 NOTE — Progress Notes (Signed)
NURSING PROGRESS NOTE  Malik Rojas [id number] Transfer Data: 08/21/2017 1:40 AM Attending Provider: Brand Males, MD MCN:[id number], Elyse Jarvis, MD Code Status: DNR  Malik Rojas is a 84 y.o. male patient transferred from [place] -No acute distress noted.  -No complaints of shortness of breath.  -No complaints of chest pain.   Cardiac Monitoring: Box # 04 in place. Cardiac monitor yields: AV Paced  Last Documented Vital Signs: Blood pressure (!) 125/49, pulse 61, temperature (!) 97.5 F (36.4 C), temperature source Oral, resp. rate (!) 22, height 5\' 10"  (1.778 m), weight 94.5 kg (208 lb 6.4 oz), SpO2 93 %.  IV Fluids:  IV in place, occlusive dsg intact without redness, IV cath forearm right & left, condition patent and no redness Heparin infusing  Allergies:  Patient has no known allergies.  Past Medical History:   has a past medical history of Basal cell carcinoma, CAD (coronary artery disease), Chronic combined systolic and diastolic CHF, NYHA class 4 ([place]) (11/06/2016), Complete heart block ([place]), High cholesterol, HTN (hypertension), cardiovascular stress test, Melanoma ([place]), Obesity, Pneumonia (1940), Presence of permanent cardiac pacemaker, Type II diabetes mellitus ([place]), Urinary frequency, and Urinary incontinence.  Past Surgical History:   has a past surgical history that includes Permanent pacemaker generator change (N/A, 02/12/2013); Mohs surgery (Left); Tonsillectomy; Appendectomy (40 years ago); Transurethral resection of prostate (N/A, 10/13/2014); Insert / replace / remove pacemaker (~ 1999); and Excision basal cell carcinoma.  Social History:   reports that he quit smoking about 44 years ago. He has a 90.00 pack-year smoking history. he has never used smokeless tobacco. He reports that he drinks alcohol. He reports that he does not use drugs.  Skin: Right flank bruising; redden buttock; red flaky bilateral lower extremities  Patient/Family orientated to room.  Information packet given to patient/family. Admission inpatient armband information verified with patient/family to include name and date of birth and placed on patient arm. Side rails up x 2, fall assessment and education completed with patient/family. Patient/family able to verbalize understanding of risk associated with falls and verbalized understanding to call for assistance before getting out of bed. Call light within reach. Patient/family able to voice and demonstrate understanding of unit orientation instructions.    Will continue to evaluate and treat per MD orders.

## 2017-08-21 NOTE — Progress Notes (Signed)
  Speech Language Pathology Treatment: Dysphagia  Patient Details Name: Malik Rojas MRN: [id number] DOB: 1931-08-21 Today's Date: 08/21/2017 Time: 3532-9924 SLP Time Calculation (min) (ACUTE ONLY): 19 min  Assessment / Plan / Recommendation Clinical Impression  RN paged this SLP yesterday reporting pt with significant pocketing of lunch and advised to downgrade to puree. Session focused on review of MBS, results and recommendations with wife, daughter and granddaughter (pt lethargic). Family reports they are hopeful for inpatient rehab for return home (discussed PT's rec for SNF and CIR's level of intensity- they state plan is to have CIR evaluate?). Reviewed continued rec for Dys 1 (puree), nectar thick liquids and swallow strategies (sit upright, small sips, check oral cavity for pocketing). Continue ST intervention.    HPI HPI: This is a Malik Rojas history of coronary artery disease, DM, HTN, complete heart block, pacemaker presents today status post cardiac arrest.Per chart resuscitation was 37 minutes with paced rhythm followed by return of pulses. Intubated 1/7-1/14. CXR 1/15 Aortic atherosclerosis. Endotracheal and nasogastric tubes have been removed. Stable bibasilar atelectasis or edema is noted with associated pleural effusions. CXR 1/14 CHF with slight interval decrease in interstitial edema. Persistent bibasilar pleural effusions layering posteriorly.      SLP Plan  Continue with current plan of care       Recommendations  Diet recommendations: Dysphagia 1 (puree);Nectar-thick liquid Liquids provided via: Cup Medication Administration: Crushed with puree Supervision: Patient able to self feed;Intermittent supervision to cue for compensatory strategies Compensations: Slow rate;Small sips/bites;Lingual sweep for clearance of pocketing Postural Changes and/or Swallow Maneuvers: Seated upright 90 degrees                Oral Care Recommendations: Oral care BID Follow  up Recommendations: Other (comment)(TBD) SLP Visit Diagnosis: Dysphagia, oropharyngeal phase (R13.12) Plan: Continue with current plan of care       GO                Houston Siren 08/21/2017, 11:49 AM   Orbie Pyo Colvin Caroli.Ed [employer] [phone]

## 2017-08-21 NOTE — Progress Notes (Signed)
Physical Therapy Treatment Patient Details Name: Malik Rojas MRN: [id number] DOB: 08/13/1931 Today's Date: 08/21/2017    History of Present Illness Pt is a 84 yo male admitted 1/7 s/p cardiac arrest at home. downtime was 37 minutes. PMH: baseal cell carcinoma, CAG, CH, HTN, high cholesterol    PT Comments    Pt making gains and able to stand upright in therapy with sara stedy frame.  Pt tolerating more activity but remains to require +2 external assistance at this time.  Pt fatigued from having BM during session.  Informed supervising PT of patient progress and at this time we are recommending rehab in a post acute setting.  Plan to progress to gait training next session as patient is able.     Follow Up Recommendations  (SNF vs. CIR pending patient gains in therapy)     Equipment Recommendations  None recommended by PT(TBD)    Recommendations for Other Services       Precautions / Restrictions Precautions Precautions: Fall Restrictions Weight Bearing Restrictions: No    Mobility  Bed Mobility               General bed mobility comments: Pt received on BSC having a BM.    Transfers Overall transfer level: Needs assistance     Sit to Stand: Max assist;+2 physical assistance         General transfer comment: Cues for hand placement to pull on cross bar of sara stedy.  Pt required assistance to boost into standing and maintain standing, secondary to need for pericare post BM.  Pt with poor posture in standing and required faciliation for hip extension, trunk extension and upper trunk control.  Pt tolerated standing for 4 min before sitting on stedy plates and transferring to chair.  Max VCs for hand placement on stedy cross bars throughout to maintain safety.    Ambulation/Gait                 Stairs            Wheelchair Mobility    Modified Rankin (Stroke Patients Only)       Balance                                             Cognition Arousal/Alertness: Awake/alert Behavior During Therapy: WFL for tasks assessed/performed Overall Cognitive Status: Impaired/Different from baseline Area of Impairment: Following commands;Safety/judgement;Problem solving                       Following Commands: Follows one step commands with increased time Safety/Judgement: Decreased awareness of deficits   Problem Solving: Slow processing;Difficulty sequencing;Requires verbal cues;Requires tactile cues        Exercises      General Comments        Pertinent Vitals/Pain Pain Assessment: No/denies pain    Home Living                      Prior Function            PT Goals (current goals can now be found in the care plan section) Acute Rehab PT Goals Patient Stated Goal: home Potential to Achieve Goals: Good Progress towards PT goals: Progressing toward goals    Frequency    Min 3X/week      PT Plan Current plan  remains appropriate    Co-evaluation              AM-PAC PT "6 Clicks" Daily Activity  Outcome Measure  Difficulty turning over in bed (including adjusting bedclothes, sheets and blankets)?: Unable Difficulty moving from lying on back to sitting on the side of the bed? : Unable Difficulty sitting down on and standing up from a chair with arms (e.g., wheelchair, bedside commode, etc,.)?: Unable Help needed moving to and from a bed to chair (including a wheelchair)?: A Lot Help needed walking in hospital room?: A Lot Help needed climbing 3-5 steps with a railing? : Total 6 Click Score: 8    End of Session Equipment Utilized During Treatment: Gait belt Activity Tolerance: Patient limited by fatigue Patient left: in chair;with call bell/phone within reach;with nursing/sitter in room Nurse Communication: Mobility status;Need for lift equipment(educated staff to use stedy to return patient.  ) PT Visit Diagnosis: Unsteadiness on feet (R26.81);Muscle weakness  (generalized) (M62.81)     Time: 0254-2706 PT Time Calculation (min) (ACUTE ONLY): 28 min  Charges:  $Therapeutic Activity: 23-37 mins                    G CodesGovernor Rooks, PTA pager [phone]    Cristela Blue 08/21/2017, 6:07 PM

## 2017-08-21 NOTE — Progress Notes (Signed)
Patient ID: Malik Rojas, male   DOB: [birth year], 84 y.o.   MRN: [id number]  This NP visited patient at the bedside as a follow up to  yesterday's [place].  Patient is weak, voice is weak and he is difficult to understand.  His wife,  daughter and granddaughter are at the bedside.  Daughter Vaughan Basta is spokesperson for patient and family this morning.  Family tell me they see him as improved today and that "his numbers are better also".  They are referring to is slight decrease in creatinine which now is 4.1L.  Family are now requesting an evaluation for rehabilitation here in the hospital/CIR.   I discussed with both Dr Cathlean Sauer and Physical Therapy/ Aimee Stann [place] PTA regarding family's [place]  I shared my concern for the seriousness of the patient's medical situation and high risk for decompensation.  Discussed with patient the importance of continued conversation with family and their  medical providers regarding overall plan of care and treatment options,  ensuring decisions are within the context of the patients values and GOCs.  Questions and concerns addressed.  I shared with family that I will not be in the hospital until Monday and educated them on how to contact the palliative medicine team with needs or concerns  Time in 1120          Time out  1155   Total time spent on the unit was 35 minutes  Greater than 50% of the time was spent in counseling and coordination of care  Wadie Lessen NP  Palliative Medicine Team Team Phone # [phone] Pager [phone]

## 2017-08-21 NOTE — Progress Notes (Addendum)
ANTICOAGULATION CONSULT NOTE   Pharmacy Consult for Heparin Indication: DVT  No Known Allergies  Patient Measurements: Height: _0  (177.8 cm) Weight: 207 lb 3.7 oz (94 kg) IBW/kg (Calculated) : 73  HDWt: 92.1 kg  Vital Signs: Temp: 97.6 F (36.4 C) (01/17 1214) Temp Source: Oral (01/17 1214) BP: 111/51 (01/17 1214) Pulse Rate: 61 (01/17 1214)  Labs: Recent Labs    08/19/17 0149  08/20/17 0402 08/21/17 0614 08/21/17 1700  HGB 13.5  --  14.6 14.9  --   HCT 41.6  --  44.7 46.0  --   PLT 301  --  295 317  --   LABPROT  --   --  13.7 14.9  --   INR  --   --  1.06 1.17  --   HEPARINUNFRC 1.03*   < > 0.30 0.25* 0.34  CREATININE 4.37*  --  4.43* 4.10*  --    < > = values in this interval not displayed.    Estimated Creatinine Clearance: 15.7 mL/min (A) (by C-G formula based on SCr of 4.1 mg/dL (H)).   Medical History: Past Medical History:  Diagnosis Date  . Basal cell carcinoma    "scalp, arms, right face;  some cut, some burndt off"  . CAD (coronary artery disease)    LHC 3/14: Distal left main 10%, LAD 20%, circumflex 10-20%, proximal RCA 20%, distal 40-50%, EF 50-55%.  . Chronic combined systolic and diastolic CHF, NYHA class 4 ([place]) 11/06/2016   Echo 4/18: severe LVH, EF 45-50, global HK, Gr 1 DD, mild AS (mean 9/peak 17), MAC, mod LAE, trivial eff  . Complete heart block (HCC)    Status post [employer] pacemaker  . High cholesterol   . HTN (hypertension)   . Hx of cardiovascular stress test    a. [place] MV 3/14:  Low risk, EF 36%, apical inf scar, no ischemia, inf-apical HK>> catheterization however demonstrated normal left ventricular function  . Melanoma ([place])    back  . Obesity   . Pneumonia 1940  . Presence of permanent cardiac pacemaker    [employer]tronic  . Type II diabetes mellitus ([place])   . Urinary frequency   . Urinary incontinence    Assessment: 84 year old male s/p prolonged CPR with CHF, respiratory failure, AKI continuing on heparin and coumadin  for new DVT (day 3 of overlap).  Heparin level came back therapeutic at 0.34, on 1150 units/hr. CBC is stable. No bleed or IV line issues per RN and drip has not been off at all.  Goal of Therapy:  Heparin level 0.3-0.7 units/ml Monitor platelets by anticoagulation protocol: Yes   Plan:  -Continue heparin at 1150 units/hr -Daily heparin level/INR/CBC -Monitor for s/sx bleeding  Doylene Canard, PharmD Clinical Pharmacist  Pager: [phone] Phone: [phone] 08/21/2017 5:43 PM

## 2017-08-22 DIAGNOSIS — G931 Anoxic brain damage, not elsewhere classified: Secondary | ICD-10-CM

## 2017-08-22 DIAGNOSIS — L8990 Pressure ulcer of unspecified site, unspecified stage: Secondary | ICD-10-CM

## 2017-08-22 LAB — CREATININE, SERUM
CREATININE: 3.78 mg/dL — AB (ref 0.61–1.24)
GFR calc Af Amer: 16 mL/min — ABNORMAL LOW (ref >60)
GFR, EST NON AFRICAN AMERICAN: 14 mL/min — AB (ref >60)

## 2017-08-22 LAB — BASIC METABOLIC PANEL
ANION GAP: 13 (ref 5–15)
BUN: 141 mg/dL — ABNORMAL HIGH (ref 6–20)
CO2: 27 mmol/L (ref 22–32)
Calcium: 8.7 mg/dL — ABNORMAL LOW (ref 8.9–10.3)
Chloride: 106 mmol/L (ref 101–111)
Creatinine, Ser: 3.88 mg/dL — ABNORMAL HIGH (ref 0.61–1.24)
GFR calc Af Amer: 15 mL/min — ABNORMAL LOW (ref >60)
GFR calc non Af Amer: 13 mL/min — ABNORMAL LOW (ref >60)
GLUCOSE: 243 mg/dL — AB (ref 65–99)
POTASSIUM: 4 mmol/L (ref 3.5–5.1)
Sodium: 146 mmol/L — ABNORMAL HIGH (ref 135–145)

## 2017-08-22 LAB — CBC
HCT: 42.2 % (ref 39.0–52.0)
HEMATOCRIT: 41.4 % (ref 39.0–52.0)
HEMOGLOBIN: 13.4 g/dL (ref 13.0–17.0)
HEMOGLOBIN: 13.7 g/dL (ref 13.0–17.0)
MCH: 31.4 pg (ref 26.0–34.0)
MCH: 31.6 pg (ref 26.0–34.0)
MCHC: 32.4 g/dL (ref 30.0–36.0)
MCHC: 32.5 g/dL (ref 30.0–36.0)
MCV: 97 fL (ref 78.0–100.0)
MCV: 97.5 fL (ref 78.0–100.0)
PLATELETS: 320 10*3/uL (ref 150–400)
Platelets: 286 10*3/uL (ref 150–400)
RBC: 4.27 MIL/uL (ref 4.22–5.81)
RBC: 4.33 MIL/uL (ref 4.22–5.81)
RDW: 14.6 % (ref 11.5–15.5)
RDW: 14.4 % (ref 11.5–15.5)
WBC: 10.9 10*3/uL — ABNORMAL HIGH (ref 4.0–10.5)
WBC: 11.8 10*3/uL — ABNORMAL HIGH (ref 4.0–10.5)

## 2017-08-22 LAB — GLUCOSE, CAPILLARY
GLUCOSE-CAPILLARY: 234 mg/dL — AB (ref 65–99)
GLUCOSE-CAPILLARY: 253 mg/dL — AB (ref 65–99)
GLUCOSE-CAPILLARY: 174 mg/dL — AB (ref 65–99)
Glucose-Capillary: 301 mg/dL — ABNORMAL HIGH (ref 65–99)

## 2017-08-22 LAB — PROTIME-INR
INR: 1.49
Prothrombin Time: 17.9 seconds — ABNORMAL HIGH (ref 11.4–15.2)

## 2017-08-22 LAB — HEPARIN LEVEL (UNFRACTIONATED): Heparin Unfractionated: 0.35 IU/mL (ref 0.30–0.70)

## 2017-08-22 MED ORDER — INSULIN ASPART 100 UNIT/ML ~~LOC~~ SOLN
0.0000 [IU] | Freq: Three times a day (TID) | SUBCUTANEOUS | Status: DC
  Administered 2017-08-22: 5 [IU] via SUBCUTANEOUS
  Administered 2017-08-23 (×2): 3 [IU] via SUBCUTANEOUS
  Administered 2017-08-23 – 2017-08-24 (×2): 2 [IU] via SUBCUTANEOUS
  Administered 2017-08-24: 3 [IU] via SUBCUTANEOUS
  Administered 2017-08-24: 5 [IU] via SUBCUTANEOUS
  Administered 2017-08-25: 2 [IU] via SUBCUTANEOUS
  Administered 2017-08-25: 3 [IU] via SUBCUTANEOUS
  Administered 2017-08-25: 5 [IU] via SUBCUTANEOUS

## 2017-08-22 MED ORDER — WARFARIN SODIUM 5 MG PO TABS
5.0000 mg | ORAL_TABLET | Freq: Once | ORAL | Status: AC
  Administered 2017-08-22: 5 mg via ORAL
  Filled 2017-08-22: qty 1

## 2017-08-22 MED ORDER — ADULT MULTIVITAMIN W/MINERALS CH
1.0000 | ORAL_TABLET | Freq: Every day | ORAL | Status: DC
  Administered 2017-08-22 – 2017-08-26 (×5): 1 via ORAL
  Filled 2017-08-22 (×4): qty 1

## 2017-08-22 MED ORDER — HEPARIN SODIUM (PORCINE) 5000 UNIT/ML IJ SOLN: 5000.0000 [IU] | Freq: Three times a day (TID) | INTRAMUSCULAR | Status: DC

## 2017-08-22 NOTE — Progress Notes (Signed)
PROGRESS NOTE    Malik Rojas  JSH:[id number] DOB: 1931-10-22 DOA: 08/11/2017 PCP: Denita Lung, MD    Brief Narrative:  84 year old male who was admitted to the intensive care unit 08/11/2017 after sustaining a cardiac arrest, apparently ventricular fibrillation. He received CPR for 37 minutes, with recovery of spontaneous circulation. Patient was intubated, placed on mechanical ventilation, and underwent hypothermic protocol. Here recovered well, extubated 1/14.   His LV function was noted to be decreased, apparently he developed ATN, which seems to be stable.   Patient was successfully transferred to the telemetry unit for further care.   Assessment & Plan:   Principal Problem:   Cardiac arrest ([place]) Active Problems:   Hypertensive heart disease with CHF (congestive heart failure) (HCC)   Diabetes mellitus due to underlying condition with diabetic nephropathy, without long-term current use of insulin (HCC)   Acute on chronic combined systolic and diastolic CHF, NYHA class 3 (HCC)   Acute hypercapnic respiratory failure (HCC)   Uncontrolled hypertension   Acute pulmonary edema (HCC)   Acute renal failure superimposed on stage 3 chronic kidney disease ([place])   Respiratory failure ([place])   Palliative care by specialist   DNR (do not resuscitate)   Weakness generalized   1. Systolic heart failure acute on chronic decompensation. Euvolemic. On coreg, asa, and isosorbide. Holding on aicd for now. Will need close follow up a outpatient. Follow limited echocardiogram.   2. Hypoxic respiratory failure/ acute. Oxymetry monitoring and supplemental 02 per [place]. No significant dyspnea at rest.   3. Acute kidney injury/ATN. Renal function continue to recover with serum cr at 3,88 from 4,1.   4. DVT right common femoral vein.  Heparin IV and will continue warfarin therapy for anticoagulation. Target INR 2 to 3.   5. Type 2 diabetes mellitus. Increase dose of insulin sliding scale  for glucose cover and  Monitoring, patient tolerating po well. Capillary glucose 234, 152, 234, 301, 253.On 10 units bid of glargine.   DVT prophylaxis: heparin  Code Status:  dnr Family Communication: I spoke with patient's family and all questions were addressed Disposition Plan: IR/ SNF   Consultants:   Cardiology   Procedures:     Antimicrobials:       Subjective: Patient feeling better, out of bed to chair, no dyspnea or chest pain, positive weakness on exertion. No nausea or vomiting and tolerating po well.   Objective: Vitals:   08/21/17 0700 08/21/17 1214 08/21/17 1956 08/22/17 0444  BP:  (!) 111/51 (!) 161/60 (!) 143/56  Pulse:  61 67 66  Resp:  20 20 20   Temp:  97.6 F (36.4 C) 97.9 F (36.6 C) 97.8 F (36.6 C)  TempSrc:  Oral Oral Oral  SpO2:  94% 94% 93%  Weight: 94 kg (207 lb 3.7 oz)   95 kg (209 lb 8 oz)  Height: 5\' 10"  (1.778 m)       Intake/Output Summary (Last 24 hours) at 08/22/2017 1144 Last data filed at 08/22/2017 0800 Gross per 24 hour  Intake 258 ml  Output 950 ml  Net -692 ml   Filed Weights   08/21/17 0407 08/21/17 0700 08/22/17 0444  Weight: 94 kg (207 lb 4.8 oz) 94 kg (207 lb 3.7 oz) 95 kg (209 lb 8 oz)    Examination:   General: Not in pain or dyspnea, deconditioned Neurology: Awake and alert, non focal  E ENT: mild pallor, no icterus, oral mucosa moist Cardiovascular: No JVD. S1-S2 present, rhythmic, no  gallops, rubs, or murmurs. No lower extremity edema. Pulmonary: vesicular breath sounds bilaterally, adequate air movement, no wheezing, rhonchi or rales. Gastrointestinal. Abdomen protuberant, no organomegaly, non tender, no rebound or guarding Skin. Stasis dermatitis bilateral legs Musculoskeletal: no joint deformities     Data Reviewed: I have personally reviewed following labs and imaging studies  CBC: Recent Labs  Lab 08/18/17 0306 08/19/17 0149 08/20/17 0402 08/21/17 0614 08/22/17 0226  WBC 10.5 14.5*  15.0* 12.4* 10.9*  NEUTROABS  --  11.2* 12.3* 10.1*  --   HGB 13.9 13.5 14.6 14.9 13.4  HCT 42.0 41.6 44.7 46.0 41.4  MCV 92.9 95.4 97.6 97.9 97.0  PLT 238 301 295 317 818   Basic Metabolic Panel: Recent Labs  Lab 08/17/17 0250 08/18/17 0306 08/19/17 0149 08/20/17 0402 08/21/17 0614 08/22/17 0226  NA 144 145 148* 149* 148* 146*  K 3.7 3.4* 4.4 4.3 3.5 4.0  CL 109 107 107 106 105 106  CO2 22 23 26 27 26 27   GLUCOSE 305* 276* 152* 151* 175* 243*  BUN 82* 105* 121* 137* 137* 141*  CREATININE 3.86* 3.79* 4.37* 4.43* 4.10* 3.88*  CALCIUM 8.6* 8.9 8.7* 9.3 9.1 8.7*  MG 2.2 2.2 2.3 2.5* 2.4  --   PHOS 3.8 4.1 6.6* 6.8* 5.9*  --    GFR: Estimated Creatinine Clearance: 16.7 mL/min (A) (by C-G formula based on SCr of 3.88 mg/dL (H)). Liver Function Tests: No results for input(s): AST, ALT, ALKPHOS, BILITOT, PROT, ALBUMIN in the last 168 hours. No results for input(s): LIPASE, AMYLASE in the last 168 hours. No results for input(s): AMMONIA in the last 168 hours. Coagulation Profile: Recent Labs  Lab 08/20/17 0402 08/21/17 0614 08/22/17 0226  INR 1.06 1.17 1.49   Cardiac Enzymes: No results for input(s): CKTOTAL, CKMB, CKMBINDEX, TROPONINI in the last 168 hours. BNP (last 3 results) No results for input(s): PROBNP in the last 8760 hours. HbA1C: No results for input(s): HGBA1C in the last 72 hours. CBG: Recent Labs  Lab 08/21/17 0748 08/21/17 1150 08/21/17 1653 08/21/17 2126 08/22/17 0742  GLUCAP 148* 209* 234* 152* 234*   Lipid Profile: No results for input(s): CHOL, HDL, LDLCALC, TRIG, CHOLHDL, LDLDIRECT in the last 72 hours. Thyroid Function Tests: No results for input(s): TSH, T4TOTAL, FREET4, T3FREE, THYROIDAB in the last 72 hours. Anemia Panel: No results for input(s): VITAMINB12, FOLATE, FERRITIN, TIBC, IRON, RETICCTPCT in the last 72 hours.    Radiology Studies: I have reviewed all of the imaging during this hospital visit personally     Scheduled  Meds: . aspirin  81 mg Per Tube Daily  . carvedilol  25 mg Per Tube BID  . hydrALAZINE  25 mg Oral Q8H  . insulin aspart  2-6 Units Subcutaneous TID WC  . insulin glargine  10 Units Subcutaneous BID  . isosorbide dinitrate  10 mg Oral TID  . mouth rinse  15 mL Mouth Rinse q12n4p  . pantoprazole  40 mg Oral QHS  . pravastatin  80 mg Per Tube q1800  . warfarin  5 mg Oral ONCE-1800  . Warfarin - Pharmacist Dosing Inpatient   Does not apply q1800   Continuous Infusions: . heparin 1,150 Units/hr (08/21/17 1749)     LOS: 11 days        Malik Davidson Gerome Apley, MD [facility] Pager [phone]

## 2017-08-22 NOTE — Progress Notes (Signed)
   See detailed rounding note from yesterday.  Limited echo ordered for the weekend.  This will help [place] determine if he has had any improvement in his pump function.  We will be available for assistance over the weekend, but will likely only around if asked for specific questions.  Glenetta Hew, MD

## 2017-08-22 NOTE — Plan of Care (Signed)
The patient remained injury free during the shift. His call bell remained within arm reach as well as his bed alarm on to prevent accidental injury

## 2017-08-22 NOTE — Progress Notes (Signed)
I met with pt and his spouse at bedside to discuss goals and expectations of an inpt rehab admission. She is in agreement. I will begin insurance authorization Monday. Admit pending insurance approval and bed availability when pt medically ready for d/c. [phone]

## 2017-08-22 NOTE — Progress Notes (Signed)
Palliative Medicine RN Note: Follow up at the request of PMT NP Wadie Lessen to discuss d/c options.   Patient is up in chair with wife feeding him ice cream. He struggles some to follow conversation, but smiles and asks questions appropriate to the conversation.  Attempted to discuss rehab options; I am very concerned about his ability to tolerate enough therapy for CIR. Mrs Euceda politely refused to engage in discussions with me regarding discharge plans. She is adamant that "the doctor" told her that he would be at [facility] for at least two more weeks, and she is counting on him progressing in those 2 weeks. Updated SW and RNCM.   Marjie Skiff Miyo Aina, RN, BSN, [facility] Palliative Medicine Team 08/22/2017 12:12 PM Office [phone]

## 2017-08-22 NOTE — Progress Notes (Signed)
Physical Therapy Treatment Patient Details Name: Malik Rojas MRN: [id number] DOB: [birth year] Today's Date: 08/22/2017    History of Present Illness Pt is a 84 yo male admitted 1/7 s/p cardiac arrest at home. downtime was 37 minutes. PMH: baseal cell carcinoma, CAG, CH, HTN, high cholesterol    PT Comments    Pt performed sit to stand transfers, seated reaching activities and seated LE strengthening exercises.  Pt required max assistance +1 to stand but only able to tolerate 15 seconds in standing.  Pt performed transfers from various surfaces requiring decreased assistance from elevated surface.  Plan for post acute rehab remains appropriate at this time.  Plan for continued standing and pre gait activities to prepare for gait in the future.    Follow Up Recommendations  CIR;SNF(CIR vs SNF ( pt will require rehab in a post acute setting))     Equipment Recommendations  None recommended by PT    Recommendations for Other Services       Precautions / Restrictions Precautions Precautions: Fall Restrictions Weight Bearing Restrictions: No    Mobility  Bed Mobility Overal bed mobility: Needs Assistance             General bed mobility comments: Pt in recliner on arrival this afternoon.    Transfers Overall transfer level: Needs assistance Equipment used: (sara stedy) Transfers: Sit to/from Stand Sit to Stand: Max assist         General transfer comment: Pt performed sit to stand from recliner and BSC with max assistance +1 and min assist +1 from higher surface of stedy plates.  Pt required cues for hand placement, pullling into standing and pushing through B LEs.     Ambulation/Gait Ambulation/Gait assistance: (Pt remains unable as he can only tolerate 15 sec in standing at this time.  )               Stairs            Wheelchair Mobility    Modified Rankin (Stroke Patients Only)       Balance Overall balance assessment: Needs  assistance Sitting-balance support: Feet supported;Bilateral upper extremity supported Sitting balance-Leahy Scale: Poor Sitting balance - Comments: Pt performed sitting and reaching tasks across midline, lateral lean to the R noted in sitting.  Pt woth forward flexed posture.   Postural control: Posterior lean Standing balance support: Bilateral upper extremity supported Standing balance-Leahy Scale: Poor Standing balance comment: able to stand with B UE support and mod to max assistance.  15 sec max.                              Cognition Arousal/Alertness: Awake/alert Behavior During Therapy: WFL for tasks assessed/performed Overall Cognitive Status: Impaired/Different from baseline Area of Impairment: Following commands;Safety/judgement;Problem solving                       Following Commands: Follows one step commands with increased time Safety/Judgement: Decreased awareness of deficits   Problem Solving: Slow processing;Difficulty sequencing;Requires verbal cues;Requires tactile cues General Comments: increased time       Exercises General Exercises - Lower Extremity Long Arc Quad: AROM;Both;10 reps;Seated Hip Flexion/Marching: AROM;Both;10 reps;Seated    General Comments        Pertinent Vitals/Pain Pain Assessment: No/denies pain    Home Living  Prior Function            PT Goals (current goals can now be found in the care plan section) Acute Rehab PT Goals Patient Stated Goal: home Potential to Achieve Goals: Good Progress towards PT goals: Progressing toward goals    Frequency           PT Plan Current plan remains appropriate    Co-evaluation              AM-PAC PT "6 Clicks" Daily Activity  Outcome Measure  Difficulty turning over in bed (including adjusting bedclothes, sheets and blankets)?: Unable Difficulty moving from lying on back to sitting on the side of the bed? :  Unable Difficulty sitting down on and standing up from a chair with arms (e.g., wheelchair, bedside commode, etc,.)?: Unable Help needed moving to and from a bed to chair (including a wheelchair)?: A Lot Help needed walking in hospital room?: Total Help needed climbing 3-5 steps with a railing? : Total 6 Click Score: 7    End of Session Equipment Utilized During Treatment: Gait belt Activity Tolerance: Patient tolerated treatment well Patient left: with call bell/phone within reach;with nursing/sitter in room;in bed;with bed alarm set Nurse Communication: Mobility status;Need for lift equipment PT Visit Diagnosis: Unsteadiness on feet (R26.81);Muscle weakness (generalized) (M62.81)     Time: 1660-6301 PT Time Calculation (min) (ACUTE ONLY): 32 min  Charges:  $Therapeutic Activity: 23-37 mins                    G CodesGovernor Rooks, PTA pager [phone]    Cristela Blue 08/22/2017, 2:28 PM

## 2017-08-22 NOTE — Progress Notes (Signed)
[place] for Heparin, coumadin  Indication: DVT  No Known Allergies  Patient Measurements: Height: 5' 10"  (177.8 cm) Weight: 209 lb 8 oz (95 kg) IBW/kg (Calculated) : 73  HDWt: 92.1 kg  Vital Signs: Temp: 97.8 F (36.6 C) (01/18 0444) Temp Source: Oral (01/18 0444) BP: 143/56 (01/18 0444) Pulse Rate: 66 (01/18 0444)  Labs: Recent Labs    08/20/17 0402 08/21/17 0614 08/21/17 1700 08/22/17 0226  HGB 14.6 14.9  --  13.4  HCT 44.7 46.0  --  41.4  PLT 295 317  --  286  LABPROT 13.7 14.9  --  17.9*  INR 1.06 1.17  --  1.49  HEPARINUNFRC 0.30 0.25* 0.34 0.35  CREATININE 4.43* 4.10*  --  3.88*    Estimated Creatinine Clearance: 16.7 mL/min (A) (by C-G formula based on SCr of 3.88 mg/dL (H)).   Medical History: Past Medical History:  Diagnosis Date  . Basal cell carcinoma    "scalp, arms, right face;  some cut, some burndt off"  . CAD (coronary artery disease)    LHC 3/14: Distal left main 10%, LAD 20%, circumflex 10-20%, proximal RCA 20%, distal 40-50%, EF 50-55%.  . Chronic combined systolic and diastolic CHF, NYHA class 4 ([place]) 11/06/2016   Echo 4/18: severe LVH, EF 45-50, global HK, Gr 1 DD, mild AS (mean 9/peak 17), MAC, mod LAE, trivial eff  . Complete heart block (HCC)    Status post [employer]tronic pacemaker  . High cholesterol   . HTN (hypertension)   . Hx of cardiovascular stress test    a. [place] MV 3/14:  Low risk, EF 36%, apical inf scar, no ischemia, inf-apical HK>> catheterization however demonstrated normal left ventricular function  . Melanoma ([place])    back  . Obesity   . Pneumonia 1940  . Presence of permanent cardiac pacemaker    [employer]tronic  . Type II diabetes mellitus ([place])   . Urinary frequency   . Urinary incontinence    Assessment: 84 year old male s/p prolonged CPR with CHF, respiratory failure, AKI continuing on heparin and coumadin for new DVT (day 4 of overlap).  Heparin level remains therapeutic on  1150 units/hr, INR trend up to 1.49. CBC wnl. No bleed documented.  Goal of Therapy:  Heparin level 0.3-0.7 units/ml Monitor platelets by anticoagulation protocol: Yes   Plan:  -Continue heparin at 1150 units/hr -Coumadin 47m PO x 1 again tonight -Daily heparin level/INR/CBC -Monitor for s/sx bleeding  HElicia Lamp PharmD, BCPS Clinical Pharmacist Clinical phone for 08/22/2017 until 3:30pm: [phone] after 3:30pm, please call main pharmacy at: [phone] 08/22/2017 8:43 AM

## 2017-08-22 NOTE — Consult Note (Signed)
Physical Medicine and Rehabilitation Consult   Reason for Consult: Anoxic brain injury.  Referring Physician: Dr. Cathlean Sauer   HPI: Malik Rojas is a 84 y.o. male with history of HTN, DMT2, chronic combined CHF, NICM s/p PPM, recent URI symptoms; who was admitted on 08/11/2017 after cardiac arrest. EMS noted faint pulse with VF as initial rhythm. He required 37  Minutes CPR with ACLS protocol and pulse regained on arrival to ED. He was responsive and intubated at admission. Not felt to be a candidate for cardiac cath and treated with cooling. 2D echo showed diffuse hypokinesis, severe calcification on MV with mild MVR and severe LVH and EF 25-30 %.  CT head showed global atrophy, no hemorrhage and poor definition of anterolateral thalamus and lenticular nucleus question due to changes of hypoxia. He was treated with diuresis due to fluid overload and tolerated extubation without difficulty on 01/14.    He was found to have DVT  Right CFV to distal iliac vein and started on IV heparin.   Cardiac arrest felt to be due to respiratory event leading cardiopulmonary arrest and question plans for ICD once stable.  He was placed on dysphagia 2 diet with nectar liquids due to dysphagia. CHF compensated and AKI resolving. Confusion and lethargy is resolving but patient noted to be debilitated. CIR recommended due to functional deficits.    Review of Systems  Unable to perform ROS: Mental acuity  Respiratory: Positive for shortness of breath (with activity per wife).   Cardiovascular: Positive for leg swelling. Negative for chest pain and palpitations.  Gastrointestinal: Negative for nausea.  Musculoskeletal: Negative for back pain, joint pain and myalgias.      Past Medical History:  Diagnosis Date  . Basal cell carcinoma    "scalp, arms, right face;  some cut, some burndt off"  . CAD (coronary artery disease)    LHC 3/14: Distal left main 10%, LAD 20%, circumflex 10-20%, proximal RCA 20%,  distal 40-50%, EF 50-55%.  . Chronic combined systolic and diastolic CHF, NYHA class 4 ([place]) 11/06/2016   Echo 4/18: severe LVH, EF 45-50, global HK, Gr 1 DD, mild AS (mean 9/peak 17), MAC, mod LAE, trivial eff  . Complete heart block (HCC)    Status post Medtronic pacemaker  . High cholesterol   . HTN (hypertension)   . Hx of cardiovascular stress test    a. [place] MV 3/14:  Low risk, EF 36%, apical inf scar, no ischemia, inf-apical HK>> catheterization however demonstrated normal left ventricular function  . Melanoma ([place])    back  . Obesity   . Pneumonia 1940  . Presence of permanent cardiac pacemaker    Medtronic  . Type II diabetes mellitus ([place])   . Urinary frequency   . Urinary incontinence     Past Surgical History:  Procedure Laterality Date  . APPENDECTOMY  40 years ago  . BASAL CELL CARCINOMA EXCISION     "scalp, right face"  . INSERT / REPLACE / REMOVE PACEMAKER  ~ 1999   medtronic EnRhythm  . MOHS SURGERY Left    ear; being monitored-every 6 months  . PERMANENT PACEMAKER GENERATOR CHANGE N/A 02/12/2013   Procedure: PERMANENT PACEMAKER GENERATOR CHANGE;  Surgeon: Deboraha Sprang, MD;  Location: [facility] CATH LAB;  Service: Cardiovascular;  Laterality: N/A;  . TONSILLECTOMY     as child  . TRANSURETHRAL RESECTION OF PROSTATE N/A 10/13/2014   Procedure: WOLF TRANSURETHRAL RESECTION OF THE PROSTATE (TURP);  Surgeon:  Carolan Clines, MD;  Location: [facility] ORS;  Service: Urology;  Laterality: N/A;    Family History  Problem Relation Age of Onset  . Leukemia Mother     Social History:  Married. Retired Administrator. He reports that he quit smoking about 44 years ago. He has a 90.00 pack-year smoking history. he has never used smokeless tobacco. He reports that he drinks alcohol on rare occasions. He reports that he does not use drugs.    Allergies: No Known Allergies    Medications Prior to Admission  Medication Sig Dispense Refill  . carvedilol (COREG) 25 MG tablet Take 1  tablet (25 mg total) 2 (two) times daily by mouth. 180 tablet 3  . isosorbide mononitrate (IMDUR) 60 MG 24 hr tablet Take 1 tablet (60 mg total) by mouth daily. 90 tablet 3  . lisinopril (PRINIVIL,ZESTRIL) 20 MG tablet Take 1 tablet (20 mg total) by mouth 2 (two) times daily. 180 tablet 3  . aspirin 81 MG tablet Take 81 mg by mouth daily.    Marland Kitchen CINNAMON PO Take 1,000 mg by mouth 2 (two) times daily as needed.    . furosemide (LASIX) 40 MG tablet Take 1 tablet (40 mg total) by mouth daily. 90 tablet 3  . glucose blood (ONE TOUCH ULTRA TEST) test strip TEST twice a day DX E11.9 200 each 2  . Insulin Glargine (TOUJEO MAX SOLOSTAR) 300 UNIT/ML SOPN Inject 12 Units into the skin daily. 3 pen 3  . Insulin Pen Needle (BD ULTRA-FINE MICRO PEN NEEDLE) 32G X 6 MM MISC Patient is to use one pen needle per day DX E11.9 100 each 3  . Omega-3 Fatty Acids (FISH OIL) 1000 MG CAPS Take 1,000 mg by mouth daily.     Glory Rosebush DELICA LANCETS 33L MISC TEST TWICE DAILY DX E11.9 200 each 3  . potassium chloride (K-DUR) 10 MEQ tablet Take 10 mEq by mouth daily.    . pravastatin (PRAVACHOL) 80 MG tablet Take 1 tablet (80 mg total) by mouth daily. 90 tablet 1  . psyllium (REGULOID) 0.52 g capsule Take 0.52 g by mouth daily.    . Pumpkin Seed-Soy Germ (AZO BLADDER CONTROL/GO-LESS) CAPS Take 1 capsule by mouth daily.    . silver sulfADIAZINE (SILVADENE) 1 % cream Apply 1 application topically daily as needed (LEG SORE).    . [DISCONTINUED] sulfamethoxazole-trimethoprim (BACTRIM DS,SEPTRA DS) 800-160 MG tablet Take 1 tablet by mouth 2 (two) times daily. (Patient not taking: Reported on 07/22/2017) 20 tablet 0    Home: Home Living Family/patient expects to be discharged to:: Skilled nursing facility Living Arrangements: Spouse/significant other Available Help at Discharge: Family, Available 24 hours/day Type of Home: House Home Access: Stairs to enter [employer] of Steps: 2 Home Layout: One level    Functional History: Prior Function Level of Independence: Independent Comments: has cane, used it past few days before admission due to SOb Functional Status:  Mobility: Bed Mobility Overal bed mobility: Needs Assistance Bed Mobility: Sit to Supine Rolling: Mod assist, Max assist Sidelying to sit: Mod assist, HOB elevated Sit to supine: Mod assist General bed mobility comments: Pt required assistance to lower trunk and to lift B LEs into bed against gravity.  Pt required cues for scooting to head of bed.  Bed placed in trendlenberg position to improve ease during scooting.   Transfers Overall transfer level: Needs assistance Equipment used: (sara stedy) Transfer via Lift Equipment: Stedy Transfers: Sit to/from Stand Sit to Stand: Max assist(+1 assistance  for back to bed transfer) Squat pivot transfers: Max assist, +2 physical assistance General transfer comment: Pt remains to require cues for hand placement, patient followed commands to pull into standing and required assistance to boost and extend B hips into standing.  Pt with slight flexed posture in standing.  Stedy plates moved behind patient and he was able to sit for transfer back to bed.  From elevated height of sara stedy plates, patient able to stand with mod assistance +1.   Ambulation/Gait General Gait Details: unable this date    ADL:    Cognition: Cognition Overall Cognitive Status: Impaired/Different from baseline Orientation Level: Oriented to person, Oriented to place, Disoriented to time, Disoriented to situation Cognition Arousal/Alertness: Awake/alert Behavior During Therapy: WFL for tasks assessed/performed Overall Cognitive Status: Impaired/Different from baseline Area of Impairment: Following commands, Safety/judgement, Problem solving Following Commands: Follows one step commands with increased time Safety/Judgement: Decreased awareness of deficits Problem Solving: Slow processing, Difficulty  sequencing, Requires verbal cues, Requires tactile cues General Comments: increased time   Blood pressure (!) 143/56, pulse 66, temperature 97.8 F (36.6 C), temperature source Oral, resp. rate 20, height 5' 10"  (1.778 m), weight 95 kg (209 lb 8 oz), SpO2 93 %. Physical Exam  Nursing note and vitals reviewed. Constitutional: He appears well-developed and well-nourished.  HENT:  Head: Normocephalic and atraumatic.  Mouth/Throat: Oropharynx is clear and moist.  Scabbed areas left forehead.  Eyes: Conjunctivae and EOM are normal. Pupils are equal, round, and reactive to light.  Neck: Normal range of motion. Neck supple.  Cardiovascular: Normal rate and regular rhythm.  Respiratory: Effort normal. No stridor. He has decreased breath sounds in the left lower field. He exhibits no tenderness.  GI: Soft. Bowel sounds are normal. He exhibits no distension. There is no tenderness.  Musculoskeletal: He exhibits no edema or tenderness.  Stasis changes BLE with flaky skin  Neurological: He is alert.  Oriented to self and place. Tangential and slow to process. He was able to follow simple one step motor commands.  Speech is dysarthric.  Upper extremity strength 3+ to 4 out of 5 proximal to distal.  Lower extremities are 2 out of 5 hip flexion and knee extension and 3+ to 4 out of 5 ankle dorsiflexion and plantar flexion.  Incisions decrease in stocking glove distribution below the mid calf bilaterally  Skin: Skin is warm and dry.  Psychiatric: His affect is blunt. His speech is delayed and tangential. He is slowed. Cognition and memory are impaired. He expresses inappropriate judgment.    Results for orders placed or performed during the hospital encounter of 08/11/17 (from the past 24 hour(s))  Glucose, capillary     Status: Abnormal   Collection Time: 08/21/17 11:50 AM  Result Value Ref Range   Glucose-Capillary 209 (H) 65 - 99 mg/dL   Comment 1 Notify RN    Comment 2 Document in Chart     Glucose, capillary     Status: Abnormal   Collection Time: 08/21/17  4:53 PM  Result Value Ref Range   Glucose-Capillary 234 (H) 65 - 99 mg/dL   Comment 1 Notify RN    Comment 2 Document in Chart   Heparin level (unfractionated)     Status: None   Collection Time: 08/21/17  5:00 PM  Result Value Ref Range   Heparin Unfractionated 0.34 0.30 - 0.70 IU/mL  Glucose, capillary     Status: Abnormal   Collection Time: 08/21/17  9:26 PM  Result Value Ref  Range   Glucose-Capillary 152 (H) 65 - 99 mg/dL   Comment 1 Notify RN    Comment 2 Document in Chart   Basic metabolic panel     Status: Abnormal   Collection Time: 08/22/17  2:26 AM  Result Value Ref Range   Sodium 146 (H) 135 - 145 mmol/L   Potassium 4.0 3.5 - 5.1 mmol/L   Chloride 106 101 - 111 mmol/L   CO2 27 22 - 32 mmol/L   Glucose, Bld 243 (H) 65 - 99 mg/dL   BUN 141 (H) 6 - 20 mg/dL   Creatinine, Ser 3.88 (H) 0.61 - 1.24 mg/dL   Calcium 8.7 (L) 8.9 - 10.3 mg/dL   GFR calc non Af Amer 13 (L) >60 mL/min   GFR calc Af Amer 15 (L) >60 mL/min   Anion gap 13 5 - 15  CBC     Status: Abnormal   Collection Time: 08/22/17  2:26 AM  Result Value Ref Range   WBC 10.9 (H) 4.0 - 10.5 K/uL   RBC 4.27 4.22 - 5.81 MIL/uL   Hemoglobin 13.4 13.0 - 17.0 g/dL   HCT 41.4 39.0 - 52.0 %   MCV 97.0 78.0 - 100.0 fL   MCH 31.4 26.0 - 34.0 pg   MCHC 32.4 30.0 - 36.0 g/dL   RDW 14.6 11.5 - 15.5 %   Platelets 286 150 - 400 K/uL  Heparin level (unfractionated)     Status: None   Collection Time: 08/22/17  2:26 AM  Result Value Ref Range   Heparin Unfractionated 0.35 0.30 - 0.70 IU/mL  Protime-INR     Status: Abnormal   Collection Time: 08/22/17  2:26 AM  Result Value Ref Range   Prothrombin Time 17.9 (H) 11.4 - 15.2 seconds   INR 1.49   Glucose, capillary     Status: Abnormal   Collection Time: 08/22/17  7:42 AM  Result Value Ref Range   Glucose-Capillary 234 (H) 65 - 99 mg/dL   Comment 1 Notify RN    Comment 2 Document in Chart    No  results found.  Assessment/Plan: Diagnosis: Debility and anoxic encephalopathy after cardiac arrest 1. Does the need for close, 24 hr/day medical supervision in concert with the patient's rehab needs make it unreasonable for this patient to be served in a less intensive setting? Yes 2. Co-Morbidities requiring supervision/potential complications: Severe cardiomyopathy, obesity, diabetes type 2, hypertension 3. Due to bladder management, bowel management, safety, skin/wound care, disease management, medication administration, pain management and patient education, does the patient require 24 hr/day rehab nursing? Yes 4. Does the patient require coordinated care of a physician, rehab nurse, PT (1-2 hrs/day, 5 days/week), OT (1-2 hrs/day, 5 days/week) and SLP (1-2 hrs/day, 5 days/week) to address physical and functional deficits in the context of the above medical diagnosis(es)? Yes Addressing deficits in the following areas: balance, endurance, locomotion, strength, transferring, bowel/bladder control, bathing, dressing, feeding, grooming, toileting, cognition, speech, language, swallowing and psychosocial support 5. Can the patient actively participate in an intensive therapy program of at least 3 hrs of therapy per day at least 5 days per week? Yes 6. The potential for patient to make measurable gains while on inpatient rehab is excellent 7. Anticipated functional outcomes upon discharge from inpatient rehab are supervision and min assist  with PT, supervision and min assist with OT, supervision and min assist with SLP. 8. Estimated rehab length of stay to reach the above functional goals is: 22-28 days 9. Anticipated  D/C setting: Home 10. Anticipated post D/C treatments: [place] therapy 11. Overall Rehab/Functional Prognosis: good  RECOMMENDATIONS: This patient's condition is appropriate for continued rehabilitative care in the following setting: CIR Patient has agreed to participate in recommended  program. Yes Note that insurance prior authorization may be required for reimbursement for recommended care.  Comment: Rehab Admissions Coordinator to follow up.  Thanks,  Meredith Staggers, MD, Mellody Drown    Bary Leriche, PA-C 08/22/2017

## 2017-08-22 NOTE — Progress Notes (Signed)
Inpatient Diabetes Program Recommendations  AACE/ADA: New Consensus Statement on Inpatient Glycemic Control (2015)  Target Ranges:  Prepandial:   less than 140 mg/dL      Peak postprandial:   less than 180 mg/dL (1-2 hours)      Critically ill patients:  140 - 180 mg/dL   Lab Results  Component Value Date   GLUCAP 301 (H) 08/22/2017   HGBA1C 8.0 07/22/2017    Review of Glycemic Control  Diabetes history: DM2 Outpatient Diabetes medications: Toujeo 12 units QD Current orders for Inpatient glycemic control: Novolog 2-6 units tidwc, Lantus 10 units bid  Needs insulin adjustment.  Inpatient Diabetes Program Recommendations:     Increase Lantus to 12 units bid Add Novolog 3 units tidwc for meal coverage insulin if pt eats > 50% meal.  Continue to follow.  Thank you. Lorenda Peck, RD, LDN, CDE Inpatient Diabetes Coordinator [phone]

## 2017-08-22 NOTE — Progress Notes (Addendum)
Nutrition Follow-up  DOCUMENTATION CODES:   Obesity unspecified  INTERVENTION:   -Continue Magic Cup TID with meals -MVI daily  NUTRITION DIAGNOSIS:   Predicted suboptimal nutrient intake related to chronic illness, dysphagia as evidenced by (s/p MBSS< just advanced to dysphagia 2 diet with nectar thick liquids).  Ongoing  GOAL:   Patient will meet greater than or equal to 90% of their needs  Progressing  MONITOR:   PO intake, Supplement acceptance, Diet advancement, Labs, Weight trends, Skin, I & O's  REASON FOR ASSESSMENT:   Consult, Ventilator Enteral/tube feeding initiation and management  ASSESSMENT:    84 yo male admitted post cardiac arrest with acute hypoxic respiratory failure, acute pulmoary edema, acute on CKD. Pt with hx of basal cell carcinoma, CAD, CHF, HTN, DM  1/10- TF on hold due to increased OGT output; KUB negative 1/11- TF restarted 1/13- rectal tube placed due to increased stool output 1/14- extubated  1/16- rectal tube removed 1/17- SLP downgraded diet to dysphagia 1 with nectar thick liquids secondary to pt pocketing food  Pt in very intense conversation with family member at time of visit. RD did not disturb.   Intake has been variable, but improving. Noted 25-80% meal completion.   Palliative care team following; pt and family hopeful for improvement. Per CIR admissions team, plan submit insurance authorization for admission to CIR.   Labs reviewed: Na: 146, CBGS: 152-301 (inpatient orders for glycemic control are 0-9 units insulin aspart TID with meals and 10 units insulin glargine BID).    Diet Order:  DIET - DYS 1 Room service appropriate? Yes; Fluid consistency: Nectar Thick  EDUCATION NEEDS:   Not appropriate for education at this time  Skin:  Skin Assessment: Skin Integrity Issues: Skin Integrity Issues:: Stage II Stage II: sacrum Other: n/a  Last BM:  08/20/17  Height:   Ht Readings from Last 1 Encounters:  08/21/17 5'  10" (1.778 m)    Weight:   Wt Readings from Last 1 Encounters:  08/22/17 209 lb 8 oz (95 kg)    Ideal Body Weight:  75.5 kg  BMI:  Body mass index is 30.06 kg/m.  Estimated Nutritional Needs:   Kcal:  1900-2100  Protein:  100-115 grams  Fluid:  1.9-2.1 L    Josha Weekley A. Jimmye Norman, RD, LDN, CDE Pager: [phone] After hours Pager: [phone]

## 2017-08-23 ENCOUNTER — Other Ambulatory Visit (HOSPITAL_COMMUNITY): Payer: Medicare Other

## 2017-08-23 LAB — CBC
HEMATOCRIT: 44.8 % (ref 39.0–52.0)
HEMOGLOBIN: 14.3 g/dL (ref 13.0–17.0)
MCH: 31.4 pg (ref 26.0–34.0)
MCHC: 31.9 g/dL (ref 30.0–36.0)
MCV: 98.2 fL (ref 78.0–100.0)
Platelets: 267 10*3/uL (ref 150–400)
RBC: 4.56 MIL/uL (ref 4.22–5.81)
RDW: 14.4 % (ref 11.5–15.5)
WBC: 12.5 10*3/uL — ABNORMAL HIGH (ref 4.0–10.5)

## 2017-08-23 LAB — GLUCOSE, CAPILLARY
Glucose-Capillary: 228 mg/dL — ABNORMAL HIGH (ref 65–99)
Glucose-Capillary: 209 mg/dL — ABNORMAL HIGH (ref 65–99)

## 2017-08-23 LAB — HEPARIN LEVEL (UNFRACTIONATED): Heparin Unfractionated: 0.54 IU/mL (ref 0.30–0.70)

## 2017-08-23 LAB — PROTIME-INR
INR: 1.75
Prothrombin Time: 20.3 seconds — ABNORMAL HIGH (ref 11.4–15.2)

## 2017-08-23 MED ORDER — WARFARIN SODIUM 5 MG PO TABS
5.0000 mg | ORAL_TABLET | Freq: Once | ORAL | Status: AC
  Administered 2017-08-23: 5 mg via ORAL
  Filled 2017-08-23: qty 1

## 2017-08-23 NOTE — Evaluation (Signed)
Occupational Therapy Evaluation Patient Details Name: Malik Rojas MRN: [id number] DOB: [birth year] Today's Date: 08/23/2017    History of Present Illness Pt is a 84 yo male admitted 1/7 s/p cardiac arrest at home. downtime was 37 minutes. PMH: baseal cell carcinoma, CAG, CH, HTN, high cholesterol   Clinical Impression   Pt with decline in function and safety with ADLs and ADL mobility with decreased strength, balance and endurance. Pt would benefit from acute OT services to maximize level of function and safety    Follow Up Recommendations  CIR    Equipment Recommendations  None recommended by OT;Other (comment)(TBD at next venue of care)    Recommendations for Other Services       Precautions / Restrictions Precautions Precautions: Fall Restrictions Weight Bearing Restrictions: No      Mobility Bed Mobility Overal bed mobility: Needs Assistance Bed Mobility: Supine to Sit     Supine to sit: Min assist;HOB elevated        Transfers Overall transfer level: Needs assistance   Transfers: Sit to/from Stand Sit to Stand: Mod assist         General transfer comment: transferred pt to recliner after bathing, bed linens soaked with urine (condom cath out upon arrival)    Balance Overall balance assessment: Needs assistance Sitting-balance support: Feet supported;Bilateral upper extremity supported Sitting balance-Leahy Scale: Poor         Standing balance comment: used Stedy bed - recliner                           ADL either performed or assessed with clinical judgement   ADL Overall ADL's : Needs assistance/impaired                                             Vision Baseline Vision/History: Wears glasses Wears Glasses: Reading only Patient Visual Report: No change from baseline       Perception     Praxis      Pertinent Vitals/Pain Pain Assessment: No/denies pain Pain Score: 0-No pain Pain Intervention(s):  Monitored during session     Hand Dominance Right   Extremity/Trunk Assessment Upper Extremity Assessment Upper Extremity Assessment: Generalized weakness   Lower Extremity Assessment Lower Extremity Assessment: Defer to PT evaluation   Cervical / Trunk Assessment Cervical / Trunk Assessment: Kyphotic   Communication Communication Communication: No difficulties   Cognition Arousal/Alertness: Awake/alert Behavior During Therapy: WFL for tasks assessed/performed Overall Cognitive Status: No family/caregiver present to determine baseline cognitive functioning Area of Impairment: Following commands;Safety/judgement;Problem solving                       Following Commands: Follows one step commands with increased time Safety/Judgement: Decreased awareness of deficits   Problem Solving: Slow processing;Difficulty sequencing;Requires verbal cues;Requires tactile cues General Comments: increased time    General Comments       Exercises     Shoulder Instructions      Home Living Family/patient expects to be discharged to:: Other (Comment)(SNF vs CIR) Living Arrangements: Spouse/significant other Available Help at Discharge: Family;Available 24 hours/day   Home Access: Stairs to enter Entrance Stairs-Number of Steps: 2   Home Layout: One level                   Additional Comments: Spouse is  able to assist, has been helping at times with ADL's      Prior Functioning/Environment          Comments: has cane, used it past few days before admission due to SOb        OT Problem List: Decreased strength;Decreased activity tolerance;Decreased knowledge of use of DME or AE;Decreased safety awareness;Impaired balance (sitting and/or standing);Decreased coordination      OT Treatment/Interventions: Self-care/ADL training;DME and/or AE instruction;Therapeutic activities;Patient/family education    OT Goals(Current goals can be found in the care plan  section) Acute Rehab OT Goals Patient Stated Goal: home OT Goal Formulation: With patient Time For Goal Achievement: 09/06/17 Potential to Achieve Goals: Good ADL Goals Pt Will Perform Grooming: with supervision;sitting Pt Will Perform Upper Body Bathing: with min guard assist;sitting Pt Will Perform Lower Body Bathing: with mod assist;sitting/lateral leans Pt Will Perform Upper Body Dressing: with min guard assist;sitting Pt Will Transfer to Toilet: with mod assist;bedside commode  OT Frequency: Min 2X/week   Barriers to D/C: Decreased caregiver support          Co-evaluation              AM-PAC PT "6 Clicks" Daily Activity     Outcome Measure Help from another person eating meals?: None Help from another person taking care of personal grooming?: A Little Help from another person toileting, which includes using toliet, bedpan, or urinal?: Total Help from another person bathing (including washing, rinsing, drying)?: A Lot Help from another person to put on and taking off regular upper body clothing?: A Little Help from another person to put on and taking off regular lower body clothing?: Total 6 Click Score: 14   End of Session Equipment Utilized During Treatment: Other (comment);Gait belt(Stedy)  Activity Tolerance: Patient tolerated treatment well Patient left: in chair;with call bell/phone within reach;with chair alarm set;with nursing/sitter in room  OT Visit Diagnosis: Unsteadiness on feet (R26.81);Muscle weakness (generalized) (M62.81);History of falling (Z91.81)                Time: 1660-6004 OT Time Calculation (min): 46 min Charges:  OT General Charges $OT Visit: 1 Visit OT Evaluation $OT Eval Moderate Complexity: 1 Mod OT Treatments $Therapeutic Activity: 8-22 mins G-Codes: OT G-codes **NOT FOR INPATIENT CLASS** Functional Assessment Tool Used: AM-PAC 6 Clicks Daily Activity     Britt Bottom 08/23/2017, 12:53 PM

## 2017-08-23 NOTE — Progress Notes (Signed)
[facility] for Heparin, coumadin  Indication: DVT  No Known Allergies  Patient Measurements: Height: _0  (177.8 cm) Weight: 210 lb 5.1 oz (95.4 kg)(bed scale) IBW/kg (Calculated) : 73  HDWt: 92.1 kg  Vital Signs: Temp: 97.4 F (36.3 C) (01/19 0549) Temp Source: Oral (01/19 0549) BP: 150/55 (01/19 0549) Pulse Rate: 74 (01/19 0549)  Labs: Recent Labs    08/21/17 0614 08/21/17 1700 08/22/17 0226 08/22/17 1843 08/23/17 0833 08/23/17 0834  HGB 14.9  --  13.4 13.7 14.3  --   HCT 46.0  --  41.4 42.2 44.8  --   PLT 317  --  286 320 267  --   LABPROT 14.9  --  17.9*  --  20.3*  --   INR 1.17  --  1.49  --  1.75  --   HEPARINUNFRC 0.25* 0.34 0.35  --   --  0.54  CREATININE 4.10*  --  3.88* 3.78*  --   --     Estimated Creatinine Clearance: 17.2 mL/min (A) (by C-G formula based on SCr of 3.78 mg/dL (H)).   Medical History: Past Medical History:  Diagnosis Date  . Basal cell carcinoma    "scalp, arms, right face;  some cut, some burndt off"  . CAD (coronary artery disease)    LHC 3/14: Distal left main 10%, LAD 20%, circumflex 10-20%, proximal RCA 20%, distal 40-50%, EF 50-55%.  . Chronic combined systolic and diastolic CHF, NYHA class 4 ([place]) 11/06/2016   Echo 4/18: severe LVH, EF 45-50, global HK, Gr 1 DD, mild AS (mean 9/peak 17), MAC, mod LAE, trivial eff  . Complete heart block (HCC)    Status post Medtronic pacemaker  . High cholesterol   . HTN (hypertension)   . Hx of cardiovascular stress test    a. [place] MV 3/14:  Low risk, EF 36%, apical inf scar, no ischemia, inf-apical HK>> catheterization however demonstrated normal left ventricular function  . Melanoma ([place])    back  . Obesity   . Pneumonia 1940  . Presence of permanent cardiac pacemaker    Medtronic  . Type II diabetes mellitus ([place])   . Urinary frequency   . Urinary incontinence    Assessment: 84 year old male s/p prolonged CPR with CHF, respiratory failure,  AKI continuing on heparin and coumadin for new DVT (day 5 of overlap).  Heparin level remains therapeutic on 1150 units/hr, INR trend up to 1.75. CBC wnl. No bleed documented.  Goal of Therapy:  Heparin level 0.3-0.7 units/ml Monitor platelets by anticoagulation protocol: Yes   Plan:  -Continue heparin at 1150 units/hr -Coumadin 63m PO x 1 again tonight -Daily heparin level/INR/CBC -Monitor for s/sx bleeding  HElicia Lamp PharmD, BCPS Clinical Pharmacist Clinical phone for 08/23/2017 until 3:30pm: [phone] after 3:30pm, please call main pharmacy at: [phone] 08/23/2017 11:07 AM

## 2017-08-23 NOTE — Progress Notes (Signed)
PROGRESS NOTE    Kwesi Sangha  QQI:[id number] DOB: February 26, 1932 DOA: 08/11/2017 PCP: Denita Lung, MD    Brief Narrative:  84 year old male who was admitted to the intensive care unit1/7/2019after sustaining a cardiac arrest, apparently ventricular fibrillation. He received CPR for 96 minutes,with recovery of spontaneous circulation.Patient was intubated,placed on mechanical ventilation,and underwent hypothermic protocol.Here recovered well,extubated1/14.  His LV function was noted to be decreased,apparently he developed ATN,which seems to be stable.  Patient was successfully transferred to the telemetry unit for further care.  Assessment & Plan:   Principal Problem:   Cardiac arrest ([place]) Active Problems:   Hypertensive heart disease with CHF (congestive heart failure) (HCC)   Diabetes mellitus due to underlying condition with diabetic nephropathy, without long-term current use of insulin (HCC)   Acute on chronic combined systolic and diastolic CHF, NYHA class 3 (HCC)   Acute hypercapnic respiratory failure (HCC)   Uncontrolled hypertension   Acute pulmonary edema (HCC)   Acute renal failure superimposed on stage 3 chronic kidney disease ([place])   Respiratory failure ([place])   Palliative care by specialist   DNR (do not resuscitate)   Weakness generalized   Pressure injury of skin  1.Systolic heart failureacute on chronic decompensation. Continue heart failure management with coreg, asa, and isosorbide. No ace inh due to decreased GFR.   2.Hypoxic respiratory failure/ acute. No dyspnea will continue oxymetry monitoring and supplemental 02 per [place].   3.Acute kidney injury/ATN. Will follow on serum cr and renal function, it has been improving with supportive therapy. No signs of hypervolemia.   4.DVT right common femoral vein. Continue heparin IV, bridge to warfarin therapy INR 1.7 with a target of 2-3.   5.Type 2 diabetes mellitus. Capillary glucose 234,  301, 253, 174, 228. Continue with 10 units bid of glargine, plus insulin sliding scale for glucose cover and monitoring.   DVT prophylaxis:heparin Code Status:dnr Family Communication:No family at bedside Disposition Plan:CIR/ SNF   Consultants:  Cardiology  Procedures:    Antimicrobials:     Subjective: Patient feeling well, no nausea or vomiting, no dyspnea, no chest pain. Has been out of bed to chair.   Objective: Vitals:   08/22/17 0444 08/22/17 1234 08/22/17 2020 08/23/17 0549  BP: (!) 143/56 (!) 105/43 127/72 (!) 150/55  Pulse: 66 64 78 74  Resp: 20 20 16    Temp: 97.8 F (36.6 C) (!) 97.5 F (36.4 C) 98.4 F (36.9 C) (!) 97.4 F (36.3 C)  TempSrc: Oral Oral Oral Oral  SpO2: 93% (!) 87% 93% 92%  Weight: 95 kg (209 lb 8 oz)   95.4 kg (210 lb 5.1 oz)  Height:        Intake/Output Summary (Last 24 hours) at 08/23/2017 0927 Last data filed at 08/23/2017 0600 Gross per 24 hour  Intake 386.8 ml  Output 975 ml  Net -588.2 ml   Filed Weights   08/21/17 0700 08/22/17 0444 08/23/17 0549  Weight: 94 kg (207 lb 3.7 oz) 95 kg (209 lb 8 oz) 95.4 kg (210 lb 5.1 oz)    Examination:   General: Not in pain or dyspnea, deconditioned Neurology: Awake and alert, non focal  E ENT: mild pallor, no icterus, oral mucosa moist Cardiovascular: No JVD. S1-S2 present, rhythmic, no gallops, rubs, or murmurs. trace lower extremity edema. Pulmonary: vesicular breath sounds bilaterally, adequate air movement, no wheezing, rhonchi or rales. Gastrointestinal. Abdomen protuberant, no organomegaly, non tender, no rebound or guarding Skin. No rashes Musculoskeletal: no joint deformities  Data Reviewed: I have personally reviewed following labs and imaging studies  CBC: Recent Labs  Lab 08/19/17 0149 08/20/17 0402 08/21/17 0614 08/22/17 0226 08/22/17 1843  WBC 14.5* 15.0* 12.4* 10.9* 11.8*  NEUTROABS 11.2* 12.3* 10.1*  --   --   HGB 13.5 14.6 14.9 13.4  13.7  HCT 41.6 44.7 46.0 41.4 42.2  MCV 95.4 97.6 97.9 97.0 97.5  PLT 301 295 317 286 366   Basic Metabolic Panel: Recent Labs  Lab 08/17/17 0250 08/18/17 0306 08/19/17 0149 08/20/17 0402 08/21/17 0614 08/22/17 0226 08/22/17 1843  NA 144 145 148* 149* 148* 146*  --   K 3.7 3.4* 4.4 4.3 3.5 4.0  --   CL 109 107 107 106 105 106  --   CO2 22 23 26 27 26 27   --   GLUCOSE 305* 276* 152* 151* 175* 243*  --   BUN 82* 105* 121* 137* 137* 141*  --   CREATININE 3.86* 3.79* 4.37* 4.43* 4.10* 3.88* 3.78*  CALCIUM 8.6* 8.9 8.7* 9.3 9.1 8.7*  --   MG 2.2 2.2 2.3 2.5* 2.4  --   --   PHOS 3.8 4.1 6.6* 6.8* 5.9*  --   --    GFR: Estimated Creatinine Clearance: 17.2 mL/min (A) (by C-G formula based on SCr of 3.78 mg/dL (H)). Liver Function Tests: No results for input(s): AST, ALT, ALKPHOS, BILITOT, PROT, ALBUMIN in the last 168 hours. No results for input(s): LIPASE, AMYLASE in the last 168 hours. No results for input(s): AMMONIA in the last 168 hours. Coagulation Profile: Recent Labs  Lab 08/20/17 0402 08/21/17 0614 08/22/17 0226  INR 1.06 1.17 1.49   Cardiac Enzymes: No results for input(s): CKTOTAL, CKMB, CKMBINDEX, TROPONINI in the last 168 hours. BNP (last 3 results) No results for input(s): PROBNP in the last 8760 hours. HbA1C: No results for input(s): HGBA1C in the last 72 hours. CBG: Recent Labs  Lab 08/21/17 2126 08/22/17 0742 08/22/17 1232 08/22/17 1634 08/22/17 2121  GLUCAP 152* 234* 301* 253* 174*   Lipid Profile: No results for input(s): CHOL, HDL, LDLCALC, TRIG, CHOLHDL, LDLDIRECT in the last 72 hours. Thyroid Function Tests: No results for input(s): TSH, T4TOTAL, FREET4, T3FREE, THYROIDAB in the last 72 hours. Anemia Panel: No results for input(s): VITAMINB12, FOLATE, FERRITIN, TIBC, IRON, RETICCTPCT in the last 72 hours.    Radiology Studies: I have reviewed all of the imaging during this hospital visit personally     Scheduled Meds: . aspirin  81  mg Per Tube Daily  . carvedilol  25 mg Per Tube BID  . hydrALAZINE  25 mg Oral Q8H  . insulin aspart  0-9 Units Subcutaneous TID WC  . insulin glargine  10 Units Subcutaneous BID  . isosorbide dinitrate  10 mg Oral TID  . mouth rinse  15 mL Mouth Rinse q12n4p  . multivitamin with minerals  1 tablet Oral Daily  . pantoprazole  40 mg Oral QHS  . pravastatin  80 mg Per Tube q1800  . Warfarin - Pharmacist Dosing Inpatient   Does not apply q1800   Continuous Infusions: . heparin 1,150 Units/hr (08/22/17 1436)     LOS: 12 days        Janaiyah Blackard Gerome Apley, MD [facility] Pager [phone]

## 2017-08-23 NOTE — Plan of Care (Signed)
Patient continues to display ability to achieve and maintain adequate cardiopulmonary perfusion. He remains on room air and AV pacing on the monitor. Will continue to monitor for improvement and safety.

## 2017-08-23 NOTE — Progress Notes (Signed)
On call contacted about drop in BP from 169/66 @ 2143.  To 96/55 @ 2300.  Pt place on 2L-02 because sat at 88%.  Pt now 94 %on 2l.  Advised to monitor.

## 2017-08-24 ENCOUNTER — Inpatient Hospital Stay (HOSPITAL_COMMUNITY): Payer: Medicare Other

## 2017-08-24 DIAGNOSIS — I351 Nonrheumatic aortic (valve) insufficiency: Secondary | ICD-10-CM

## 2017-08-24 LAB — GLUCOSE, CAPILLARY
GLUCOSE-CAPILLARY: 184 mg/dL — AB (ref 65–99)
GLUCOSE-CAPILLARY: 221 mg/dL — AB (ref 65–99)
Glucose-Capillary: 166 mg/dL — ABNORMAL HIGH (ref 65–99)
Glucose-Capillary: 211 mg/dL — ABNORMAL HIGH (ref 65–99)
Glucose-Capillary: 253 mg/dL — ABNORMAL HIGH (ref 65–99)
Glucose-Capillary: 162 mg/dL — ABNORMAL HIGH (ref 65–99)

## 2017-08-24 LAB — CBC
HEMATOCRIT: 40.3 % (ref 39.0–52.0)
HEMOGLOBIN: 12.8 g/dL — AB (ref 13.0–17.0)
MCH: 31.2 pg (ref 26.0–34.0)
MCHC: 31.8 g/dL (ref 30.0–36.0)
MCV: 98.3 fL (ref 78.0–100.0)
Platelets: 253 10*3/uL (ref 150–400)
RBC: 4.1 MIL/uL — AB (ref 4.22–5.81)
RDW: 14.4 % (ref 11.5–15.5)
WBC: 9.4 10*3/uL (ref 4.0–10.5)

## 2017-08-24 LAB — BASIC METABOLIC PANEL
ANION GAP: 13 (ref 5–15)
BUN: 127 mg/dL — ABNORMAL HIGH (ref 6–20)
CO2: 24 mmol/L (ref 22–32)
Calcium: 8.7 mg/dL — ABNORMAL LOW (ref 8.9–10.3)
Chloride: 105 mmol/L (ref 101–111)
Creatinine, Ser: 3.43 mg/dL — ABNORMAL HIGH (ref 0.61–1.24)
GFR calc Af Amer: 18 mL/min — ABNORMAL LOW (ref >60)
GFR, EST NON AFRICAN AMERICAN: 15 mL/min — AB (ref >60)
GLUCOSE: 286 mg/dL — AB (ref 65–99)
Potassium: 4.6 mmol/L (ref 3.5–5.1)
Sodium: 142 mmol/L (ref 135–145)

## 2017-08-24 LAB — ECHOCARDIOGRAM LIMITED
Height: 70 in
Weight: 3227.53 oz

## 2017-08-24 LAB — PROTIME-INR
INR: 1.88
PROTHROMBIN TIME: 21.5 s — AB (ref 11.4–15.2)

## 2017-08-24 LAB — HEPARIN LEVEL (UNFRACTIONATED): HEPARIN UNFRACTIONATED: 0.47 [IU]/mL (ref 0.30–0.70)

## 2017-08-24 MED ORDER — WARFARIN SODIUM 5 MG PO TABS
5.0000 mg | ORAL_TABLET | Freq: Once | ORAL | Status: AC
  Administered 2017-08-24: 5 mg via ORAL
  Filled 2017-08-24: qty 1

## 2017-08-24 NOTE — Progress Notes (Signed)
PROGRESS NOTE    Malik Rojas  YYT:[id number] DOB: 09-13-1931 DOA: 08/11/2017 PCP: Denita Lung, MD    Brief Narrative:  84 year old male who was admitted to the intensive care unit1/7/2019after sustaining a cardiac arrest, apparently ventricular fibrillation. Apparently patient complained of acute on chronic dyspnea and he asked his wife to call 911, his dyspnea, progressively worse, to the point where he lost consciousness immediately after EMS arrived. He received CPR for 41 minutes,with recovery of spontaneous circulation.Patient was intubated,placed on mechanical ventilation,and underwent hypothermic protocol.This time of initial physical examination, patient was sedated paralyzed. His blood pressure was 160/72, heart rate 60, temperature 96.1, respiratory 16, oxygen saturation 99% on invasive mechanical ventilation. He had coarse breath sounds bilaterally, heart S1-S2 present, creatinine, no gallops, rubs or murmurs, the was soft nontender, positive nonpitting bilateral lower extremity edema. His chest x-ray showed cardiomegaly with bilateral interstitial markings consistent with pulmonary edema.   Here recovered well,extubated1/14.  His LV function was noted to be decreased,apparently he developed ATN,which seems to be stable and improving.  Patient was successfully transferred to the telemetry unit for further care. Waiting for inpatient rehab evaluation.   Assessment & Plan:   Principal Problem:   Cardiac arrest ([place]) Active Problems:   Hypertensive heart disease with CHF (congestive heart failure) (HCC)   Diabetes mellitus due to underlying condition with diabetic nephropathy, without long-term current use of insulin (HCC)   Acute on chronic combined systolic and diastolic CHF, NYHA class 3 (HCC)   Acute hypercapnic respiratory failure (HCC)   Uncontrolled hypertension   Acute pulmonary edema (HCC)   Acute renal failure superimposed on stage 3 chronic kidney  disease ([place])   Respiratory failure ([place])   Palliative care by specialist   DNR (do not resuscitate)   Weakness generalized   Pressure injury of skin   1.Systolic heart failureacute on chronic decompensation. Heart failure management with coreg, asa, and isosorbide.Currently holding on ace inh due to decreased GFR and risk of AKI. Will follow on repeat limited echocardiography, prior LV EF 25 to 30%, with diffuse hypokinesis.   2.Hypoxic respiratory failure/ acute. Continue to improve, oxymetry 96% on 2 lpm [place].   3.Acute kidney injury/ATN.Clinically euvolemic, pending renal function results this am, will continue conservative supportive medical therapy, will follow on renal panel in am. Urine output 750 since admission, with a net negative fluid balance -16,441 ml.  4.DVT right common femoral vein. On heparin IV per pharmacy protocol, bridge to warfarin, pending INR result this am. Target 2-3.   5.Type 2 diabetes mellitus. Patient tolerating po well, capillary glucose166, 228, 211, 209, 184. Basal insulin 10 units bid, and insulin sliding scale for glucose cover and monitoring.   DVT prophylaxis:heparin IV Code Status:dnr Family Communication:No family at bedside Disposition Plan:CIR/ SNF   Consultants:  Cardiology  Procedures:    Antimicrobials:      Subjective: Patient continue to feel well, dyspnea only with exertion, no chest pain, no nausea or vomiting.   Objective: Vitals:   08/24/17 0024 08/24/17 0154 08/24/17 0500 08/24/17 0610  BP: (!) 132/38 (!) 111/45 (!) 124/39   Pulse: 60 61 63   Resp:   20   Temp:   98.2 F (36.8 C)   TempSrc:   Oral   SpO2: 95% 96% 95%   Weight:    91.5 kg (201 lb 11.5 oz)  Height:        Intake/Output Summary (Last 24 hours) at 08/24/2017 1003 Last data filed at 08/24/2017 [phone]  Gross per 24 hour  Intake 420 ml  Output 750 ml  Net -330 ml   Filed Weights   08/22/17 0444 08/23/17 0549 08/24/17 0610   Weight: 95 kg (209 lb 8 oz) 95.4 kg (210 lb 5.1 oz) 91.5 kg (201 lb 11.5 oz)    Examination:   General: Not in pain or dyspnea, deconditioned Neurology: Awake and alert, non focal  E ENT: mild pallor, no icterus, oral mucosa moist Cardiovascular: No JVD. S1-S2 present, rhythmic, no gallops, rubs, or murmurs. Non pitting lower extremity edema ++. Pulmonary: vesicular breath sounds bilaterally, adequate air movement, no wheezing, rhonchi or rales. Poor inspiratory effort.  Gastrointestinal. Abdomen protuberant, no organomegaly, non tender, no rebound or guarding Skin. Distal leg discoloration.  Musculoskeletal: no joint deformities     Data Reviewed: I have personally reviewed following labs and imaging studies  CBC: Recent Labs  Lab 08/19/17 0149 08/20/17 0402 08/21/17 0614 08/22/17 0226 08/22/17 1843 08/23/17 0833  WBC 14.5* 15.0* 12.4* 10.9* 11.8* 12.5*  NEUTROABS 11.2* 12.3* 10.1*  --   --   --   HGB 13.5 14.6 14.9 13.4 13.7 14.3  HCT 41.6 44.7 46.0 41.4 42.2 44.8  MCV 95.4 97.6 97.9 97.0 97.5 98.2  PLT 301 295 317 286 320 272   Basic Metabolic Panel: Recent Labs  Lab 08/18/17 0306 08/19/17 0149 08/20/17 0402 08/21/17 0614 08/22/17 0226 08/22/17 1843  NA 145 148* 149* 148* 146*  --   K 3.4* 4.4 4.3 3.5 4.0  --   CL 107 107 106 105 106  --   CO2 23 26 27 26 27   --   GLUCOSE 276* 152* 151* 175* 243*  --   BUN 105* 121* 137* 137* 141*  --   CREATININE 3.79* 4.37* 4.43* 4.10* 3.88* 3.78*  CALCIUM 8.9 8.7* 9.3 9.1 8.7*  --   MG 2.2 2.3 2.5* 2.4  --   --   PHOS 4.1 6.6* 6.8* 5.9*  --   --    GFR: Estimated Creatinine Clearance: 16.8 mL/min (A) (by C-G formula based on SCr of 3.78 mg/dL (H)). Liver Function Tests: No results for input(s): AST, ALT, ALKPHOS, BILITOT, PROT, ALBUMIN in the last 168 hours. No results for input(s): LIPASE, AMYLASE in the last 168 hours. No results for input(s): AMMONIA in the last 168 hours. Coagulation Profile: Recent Labs  Lab  08/20/17 0402 08/21/17 0614 08/22/17 0226 08/23/17 0833  INR 1.06 1.17 1.49 1.75   Cardiac Enzymes: No results for input(s): CKTOTAL, CKMB, CKMBINDEX, TROPONINI in the last 168 hours. BNP (last 3 results) No results for input(s): PROBNP in the last 8760 hours. HbA1C: No results for input(s): HGBA1C in the last 72 hours. CBG: Recent Labs  Lab 08/23/17 0750 08/23/17 1147 08/23/17 1656 08/23/17 2151 08/24/17 0742  GLUCAP 166* 228* 211* 209* 184*   Lipid Profile: No results for input(s): CHOL, HDL, LDLCALC, TRIG, CHOLHDL, LDLDIRECT in the last 72 hours. Thyroid Function Tests: No results for input(s): TSH, T4TOTAL, FREET4, T3FREE, THYROIDAB in the last 72 hours. Anemia Panel: No results for input(s): VITAMINB12, FOLATE, FERRITIN, TIBC, IRON, RETICCTPCT in the last 72 hours.    Radiology Studies: I have reviewed all of the imaging during this hospital visit personally     Scheduled Meds: . aspirin  81 mg Per Tube Daily  . carvedilol  25 mg Per Tube BID  . hydrALAZINE  25 mg Oral Q8H  . insulin aspart  0-9 Units Subcutaneous TID WC  . insulin glargine  10 Units Subcutaneous BID  . isosorbide dinitrate  10 mg Oral TID  . mouth rinse  15 mL Mouth Rinse q12n4p  . multivitamin with minerals  1 tablet Oral Daily  . pantoprazole  40 mg Oral QHS  . pravastatin  80 mg Per Tube q1800  . Warfarin - Pharmacist Dosing Inpatient   Does not apply q1800   Continuous Infusions: . heparin 1,150 Units/hr (08/23/17 1316)     LOS: 13 days        Ketan Renz Gerome Apley, MD [facility] Pager [phone]

## 2017-08-24 NOTE — Progress Notes (Signed)
[facility] for Heparin, coumadin  Indication: DVT  No Known Allergies  Patient Measurements: Height: _0  (177.8 cm) Weight: 201 lb 11.5 oz (91.5 kg)(bed) IBW/kg (Calculated) : 73  HDWt: 92.1 kg  Vital Signs: Temp: 98.2 F (36.8 C) (01/20 0500) Temp Source: Oral (01/20 0500) BP: 124/39 (01/20 0500) Pulse Rate: 63 (01/20 0500)  Labs: Recent Labs    08/21/17 1700  08/22/17 0226 08/22/17 1843 08/23/17 0833 08/23/17 0834  HGB  --    < > 13.4 13.7 14.3  --   HCT  --   --  41.4 42.2 44.8  --   PLT  --   --  286 320 267  --   LABPROT  --   --  17.9*  --  20.3*  --   INR  --   --  1.49  --  1.75  --   HEPARINUNFRC 0.34  --  0.35  --   --  0.54  CREATININE  --   --  3.88* 3.78*  --   --    < > = values in this interval not displayed.    Estimated Creatinine Clearance: 16.8 mL/min (A) (by C-G formula based on SCr of 3.78 mg/dL (H)).   Medical History: Past Medical History:  Diagnosis Date  . Basal cell carcinoma    "scalp, arms, right face;  some cut, some burndt off"  . CAD (coronary artery disease)    LHC 3/14: Distal left main 10%, LAD 20%, circumflex 10-20%, proximal RCA 20%, distal 40-50%, EF 50-55%.  . Chronic combined systolic and diastolic CHF, NYHA class 4 ([place]) 11/06/2016   Echo 4/18: severe LVH, EF 45-50, global HK, Gr 1 DD, mild AS (mean 9/peak 17), MAC, mod LAE, trivial eff  . Complete heart block (HCC)    Status post Medtronic pacemaker  . High cholesterol   . HTN (hypertension)   . Hx of cardiovascular stress test    a. [place] MV 3/14:  Low risk, EF 36%, apical inf scar, no ischemia, inf-apical HK>> catheterization however demonstrated normal left ventricular function  . Melanoma ([place])    back  . Obesity   . Pneumonia 1940  . Presence of permanent cardiac pacemaker    Medtronic  . Type II diabetes mellitus ([place])   . Urinary frequency   . Urinary incontinence    Assessment: 84 year old male s/p prolonged CPR  with CHF, respiratory failure, AKI continuing on heparin and coumadin for new DVT, completed 5 days of overlap.   Heparin level remains therapeutic, INR trend up to 1.88. CBC wnl. No bleed documented.  Goal of Therapy:  Heparin level 0.3-0.7 units/ml Monitor platelets by anticoagulation protocol: Yes   Plan:  -Continue heparin at 1150 units/hr -Coumadin 65m PO x 1 again tonight -Daily heparin level/INR/CBC -Monitor for s/sx bleeding  HElicia Lamp PharmD, BCPS Clinical Pharmacist Clinical phone for 08/24/2017 until 3:30pm: [phone] after 3:30pm, please call main pharmacy at: [phone] 08/24/2017 11:12 AM

## 2017-08-24 NOTE — Plan of Care (Signed)
  Progressing Safety: Ability to remain free from injury will improve 08/24/2017 0453 - Progressing by Ardine Eng, RN

## 2017-08-24 NOTE — Progress Notes (Signed)
  Echocardiogram 2D Echocardiogram has been performed.  Johny Chess 08/24/2017, 8:58 AM

## 2017-08-24 NOTE — Progress Notes (Signed)
Patient resting no signs distress.  Patient will open eyes and respond to verbal and tactile stimuli.  Bp @ 0024 was 132/38 P-60 R-20.  BP @0154  was 111/45, P-61, R-18.  02 is 94%@2L .

## 2017-08-24 NOTE — Progress Notes (Signed)
Central tele reports pt had 14 beat run vtach that pacer corrected.  Pt resting no signs distress noted. On call MD notified.

## 2017-08-25 ENCOUNTER — Ambulatory Visit: Payer: Medicare Other | Admitting: Physician Assistant

## 2017-08-25 LAB — BASIC METABOLIC PANEL
Anion gap: 15 (ref 5–15)
BUN: 128 mg/dL — AB (ref 6–20)
CO2: 24 mmol/L (ref 22–32)
Calcium: 9 mg/dL (ref 8.9–10.3)
Chloride: 104 mmol/L (ref 101–111)
Creatinine, Ser: 3.3 mg/dL — ABNORMAL HIGH (ref 0.61–1.24)
GFR calc Af Amer: 18 mL/min — ABNORMAL LOW (ref >60)
GFR, EST NON AFRICAN AMERICAN: 16 mL/min — AB (ref >60)
GLUCOSE: 191 mg/dL — AB (ref 65–99)
POTASSIUM: 5.1 mmol/L (ref 3.5–5.1)
Sodium: 143 mmol/L (ref 135–145)

## 2017-08-25 LAB — HEPARIN LEVEL (UNFRACTIONATED): Heparin Unfractionated: 0.59 IU/mL (ref 0.30–0.70)

## 2017-08-25 LAB — GLUCOSE, CAPILLARY
GLUCOSE-CAPILLARY: 165 mg/dL — AB (ref 65–99)
GLUCOSE-CAPILLARY: 229 mg/dL — AB (ref 65–99)
GLUCOSE-CAPILLARY: 261 mg/dL — AB (ref 65–99)
GLUCOSE-CAPILLARY: 124 mg/dL — AB (ref 65–99)

## 2017-08-25 LAB — CBC
HCT: 42 % (ref 39.0–52.0)
Hemoglobin: 13.4 g/dL (ref 13.0–17.0)
MCH: 31.3 pg (ref 26.0–34.0)
MCHC: 31.9 g/dL (ref 30.0–36.0)
MCV: 98.1 fL (ref 78.0–100.0)
PLATELETS: 271 10*3/uL (ref 150–400)
RBC: 4.28 MIL/uL (ref 4.22–5.81)
RDW: 14.6 % (ref 11.5–15.5)
WBC: 11.7 10*3/uL — ABNORMAL HIGH (ref 4.0–10.5)

## 2017-08-25 LAB — PROTIME-INR
INR: 1.83
Prothrombin Time: 21 seconds — ABNORMAL HIGH (ref 11.4–15.2)

## 2017-08-25 MED ORDER — INSULIN ASPART 100 UNIT/ML ~~LOC~~ SOLN
0.0000 [IU] | Freq: Three times a day (TID) | SUBCUTANEOUS | Status: DC
  Administered 2017-08-26 (×2): 3 [IU] via SUBCUTANEOUS
  Administered 2017-08-26: 5 [IU] via SUBCUTANEOUS

## 2017-08-25 MED ORDER — WARFARIN SODIUM 7.5 MG PO TABS
7.5000 mg | ORAL_TABLET | Freq: Once | ORAL | Status: AC
  Administered 2017-08-25: 7.5 mg via ORAL
  Filled 2017-08-25: qty 1

## 2017-08-25 NOTE — Progress Notes (Addendum)
The patient has been seen in conjunction with Reino Bellis, NP-C. All aspects of care have been considered and discussed. The patient has been personally interviewed, examined, and all clinical data has been reviewed.   Echo demonstrates improvement in LV systolic function compared with earlier during hospital stay closer to the cardiac arrest.  Creatinine is still significantly elevated.  Recommend continued conservative therapy with heart failure and ischemic heart disease guideline determined therapy.  At his age, if no ischemic symptoms would not pursue angiography.  Please call if further questions.  We will sign off.  Progress Note  Patient Name: Malik Rojas Date of Encounter: 08/25/2017  Primary Cardiologist: Caryl Comes  Subjective   Sitting up in chair, wife at the bedside. Worked with PT yesterday and was able to stand with assistance.  Inpatient Medications    Scheduled Meds: . aspirin  81 mg Per Tube Daily  . carvedilol  25 mg Per Tube BID  . hydrALAZINE  25 mg Oral Q8H  . insulin aspart  0-9 Units Subcutaneous TID WC  . insulin glargine  10 Units Subcutaneous BID  . isosorbide dinitrate  10 mg Oral TID  . mouth rinse  15 mL Mouth Rinse q12n4p  . multivitamin with minerals  1 tablet Oral Daily  . pantoprazole  40 mg Oral QHS  . pravastatin  80 mg Per Tube q1800  . warfarin  7.5 mg Oral ONCE-1800  . Warfarin - Pharmacist Dosing Inpatient   Does not apply q1800   Continuous Infusions: . heparin 1,150 Units/hr (08/24/17 1157)   PRN Meds: albuterol, artificial tears, fentaNYL (SUBLIMAZE) injection, hydrALAZINE, Influenza vac split quadrivalent PF, pneumococcal 23 valent vaccine, RESOURCE THICKENUP CLEAR   Vital Signs    Vitals:   08/24/17 1211 08/24/17 1634 08/24/17 1923 08/25/17 0522  BP: (!) 113/47 (!) 152/51 (!) 156/64 (!) 137/49  Pulse: 60  79 64  Resp: 20  18 18   Temp: (!) 97.5 F (36.4 C)  98.2 F (36.8 C) 97.6 F (36.4 C)  TempSrc: Oral   Oral Oral  SpO2: 94%  92% 94%  Weight:    213 lb 3 oz (96.7 kg)  Height:        Intake/Output Summary (Last 24 hours) at 08/25/2017 1050 Last data filed at 08/25/2017 1031 Gross per 24 hour  Intake 1521.5 ml  Output 850 ml  Net 671.5 ml   Filed Weights   08/23/17 0549 08/24/17 0610 08/25/17 0522  Weight: 210 lb 5.1 oz (95.4 kg) 201 lb 11.5 oz (91.5 kg) 213 lb 3 oz (96.7 kg)    Telemetry    Vpacing - Personally Reviewed  Physical Exam   General: Older WM male appearing in no acute distress. Head: Normocephalic, atraumatic.  Neck: Supple, no JVD. Lungs:  Resp regular and unlabored, Diminished in lower lobes. Heart: RRR, S1, S2, no S3, S4, or murmur; no rub. Abdomen: Soft, non-tender, non-distended with normoactive bowel sounds.  Extremities: No clubbing, cyanosis, 1+ edema. Chronic venous changes to bilateral LE. Distal pedal pulses are 2+ bilaterally. Neuro: Alert and oriented X 3. Moves all extremities spontaneously. Psych: Normal affect.  Labs    Chemistry Recent Labs  Lab 08/22/17 0226 08/22/17 1843 08/24/17 1132 08/25/17 0408  NA 146*  --  142 143  K 4.0  --  4.6 5.1  CL 106  --  105 104  CO2 27  --  24 24  GLUCOSE 243*  --  286* 191*  BUN 141*  --  127* 128*  CREATININE 3.88* 3.78* 3.43* 3.30*  CALCIUM 8.7*  --  8.7* 9.0  GFRNONAA 13* 14* 15* 16*  GFRAA 15* 16* 18* 18*  ANIONGAP 13  --  13 15     Hematology Recent Labs  Lab 08/23/17 0833 08/24/17 1132 08/25/17 0408  WBC 12.5* 9.4 11.7*  RBC 4.56 4.10* 4.28  HGB 14.3 12.8* 13.4  HCT 44.8 40.3 42.0  MCV 98.2 98.3 98.1  MCH 31.4 31.2 31.3  MCHC 31.9 31.8 31.9  RDW 14.4 14.4 14.6  PLT 267 253 271    Cardiac EnzymesNo results for input(s): TROPONINI in the last 168 hours. No results for input(s): TROPIPOC in the last 168 hours.   BNPNo results for input(s): BNP, PROBNP in the last 168 hours.   DDimer No results for input(s): DDIMER in the last 168 hours.    Radiology    No results  found.  Cardiac Studies   TTE: 08/24/17  Study Conclusions  - Left ventricle: The cavity size was moderately dilated. Wall   thickness was increased in a pattern of moderate LVH. Systolic   function was mildly to moderately reduced. The estimated ejection   fraction was in the range of 40% to 45%. Doppler parameters are   consistent with elevated ventricular end-diastolic filling   pressure. - Aortic valve: There was mild stenosis. There was trivial   regurgitation. - Mitral valve: Severely calcified annulus. - Left atrium: The atrium was mildly dilated. - Atrial septum: No defect or patent foramen ovale was identified. - Pulmonary arteries: PA peak pressure: 42 mm Hg (S).  Patient Profile     84 y.o. male with PMH of NICM, CHB s/p PPM, non-obstructive CADper cath report 2014, HTN, DM, HL who presented with sudden cardiac arreston 08/11/17. EF found to be 25-30% with diffuse hypokinesis.  Assessment & Plan    1. Cardiac arrest: -Prolonged CPR with initial  rhythm PEA vs VF>>> hypothermia protocol. Pt extubated 08/18/17. Transferred out to telemetry and now working with PT. Planned for CIR at time of discharge.  -ASA, statin, BB   2. Acute on chronic combined systolic and diastolic heart failure: -NICM with decreased EF 25-30% on echo 1/8, follow up echo 1/20 showed improvement to 45%. -Volume status is much improved, net - 16L. -Cr peaked at 4.4 37, now trending down 3.79>3.86>3.42>>3.30 -Weight stable 213lb -I&O -On BB, no room for ACEi/ARB given Cr.   3. Respiratory failure: - extubated on 08/18/17, transferred to telemetry.  - Do not re-intubate per pt  4. AKI III: -Cr now trending down 3.79>3.86>3.42>4.0>3.55>>3.3 - no longer on lasix, but weight is stable and volume has improved.   5. HTN: -stable -BB, nitrate  6. DVT in right femoral/iliac vein: Currently on IV heparin bridge to coumadin. INR 1.83 today.  Signed, Reino Bellis, NP  08/25/2017, 10:50  AM  Pager # [phone]   For questions or updates, please contact [place] Please consult [email or web address] for contact info under Cardiology/STEMI.

## 2017-08-25 NOTE — Plan of Care (Signed)
  Progressing Safety: Ability to remain free from injury will improve 08/25/2017 0610 - Progressing by Ardine Eng, RN

## 2017-08-25 NOTE — Plan of Care (Signed)
  Education: Knowledge of General Education information will improve 08/25/2017 1021 - Not Progressing by Lavonna Rua, RN   Health Behavior/Discharge Planning: Ability to manage health-related needs will improve 08/25/2017 1021 - Not Progressing by Lavonna Rua, RN   Safety: Ability to remain free from injury will improve 08/25/2017 1021 - Progressing by Lavonna Rua, RN   Cardiac: Ability to achieve and maintain adequate cardiopulmonary perfusion will improve 08/25/2017 1021 - Progressing by Lavonna Rua, RN   Neurologic: Promote progressive neurologic recovery 08/25/2017 1021 - Progressing by Lavonna Rua, RN

## 2017-08-25 NOTE — Progress Notes (Signed)
Occupational Therapy Treatment Patient Details Name: Malik Rojas MRN: [id number] DOB: 1932/02/18 Today's Date: 08/25/2017    History of present illness Pt is a 84 yo male admitted 1/7 s/p cardiac arrest at home. downtime was 37 minutes. PMH: baseal cell carcinoma, CAG, CH, HTN, high cholesterol   OT comments  Pt making progress with functional goals. Did not require use of Stedy this session for transfer. pt required increased time to initiate sit - stand and required multimodal cues for sequencing and direction for UB ADLs, grooming and to transfer to recliner. OT will continue to follow acutely  Follow Up Recommendations  CIR    Equipment Recommendations  None recommended by OT;Other (comment)(TBD at next venue of care)    Recommendations for Other Services      Precautions / Restrictions Precautions Precautions: Fall Restrictions Weight Bearing Restrictions: No       Mobility Bed Mobility Overal bed mobility: Needs Assistance Bed Mobility: Supine to Sit     Supine to sit: Min assist;HOB elevated        Transfers Overall transfer level: Needs assistance   Transfers: Sit to/from Stand;Stand Pivot Transfers Sit to Stand: Mod assist         General transfer comment: pt required increased time to initiate sit - stand and required multimodal cues for sequencing and direction to transfer to recliner    Balance Overall balance assessment: Needs assistance Sitting-balance support: Feet supported;Bilateral upper extremity supported Sitting balance-Leahy Scale: Poor     Standing balance support: Bilateral upper extremity supported;During functional activity Standing balance-Leahy Scale: Poor                             ADL either performed or assessed with clinical judgement   ADL Overall ADL's : Needs assistance/impaired     Grooming: Wash/dry hands;Wash/dry face;Min guard;Sitting           Upper Body Dressing : Minimal  assistance;Sitting       Toilet Transfer: Moderate assistance;RW;Stand-pivot Toilet Transfer Details (indicate cue type and reason): simulated to recliner SPT Toileting- Clothing Manipulation and Hygiene: Total assistance;Sit to/from stand         General ADL Comments: pt required increased time to initiate sit - stand and required multimodal cues for sequencing and direction for UB ADLs, grooming and to transfer to recliner     Vision Baseline Vision/History: Wears glasses Wears Glasses: Reading only Patient Visual Report: No change from baseline     Perception     Praxis      Cognition Arousal/Alertness: Awake/alert Behavior During Therapy: WFL for tasks assessed/performed Overall Cognitive Status: No family/caregiver present to determine baseline cognitive functioning Area of Impairment: Following commands;Safety/judgement;Problem solving                       Following Commands: Follows one step commands with increased time Safety/Judgement: Decreased awareness of deficits   Problem Solving: Slow processing;Difficulty sequencing;Requires verbal cues;Requires tactile cues General Comments: increased time to intiate and complete tasks when instructed        Exercises     Shoulder Instructions       General Comments      Pertinent Vitals/ Pain       Pain Assessment: No/denies pain Pain Score: 0-No pain Pain Intervention(s): Monitored during session  Home Living  Prior Functioning/Environment              Frequency  Min 2X/week        Progress Toward Goals  OT Goals(current goals can now be found in the care plan section)  Progress towards OT goals: Progressing toward goals     Plan Discharge plan remains appropriate    Co-evaluation                 AM-PAC PT "6 Clicks" Daily Activity     Outcome Measure   Help from another person eating meals?: None Help from  another person taking care of personal grooming?: A Little Help from another person toileting, which includes using toliet, bedpan, or urinal?: Total Help from another person bathing (including washing, rinsing, drying)?: A Lot Help from another person to put on and taking off regular upper body clothing?: A Little Help from another person to put on and taking off regular lower body clothing?: Total 6 Click Score: 14    End of Session Equipment Utilized During Treatment: Gait belt;Rolling walker  OT Visit Diagnosis: Unsteadiness on feet (R26.81);Muscle weakness (generalized) (M62.81);History of falling (Z91.81)   Activity Tolerance Patient tolerated treatment well   Patient Left in chair;with call bell/phone within reach;with chair alarm set;with nursing/sitter in room   Nurse Communication      Functional Assessment Tool Used: AM-PAC 6 Clicks Daily Activity   Time: 8466-5993 OT Time Calculation (min): 29 min  Charges: OT G-codes **NOT FOR INPATIENT CLASS** Functional Assessment Tool Used: AM-PAC 6 Clicks Daily Activity OT General Charges $OT Visit: 1 Visit OT Treatments $Self Care/Home Management : 8-22 mins $Therapeutic Activity: 8-22 mins    Britt Bottom 08/25/2017, 12:57 PM

## 2017-08-25 NOTE — H&P (Signed)
Physical Medicine and Rehabilitation Admission H&P    Chief Complaint  Patient presents with  . Cardiac Arrest   HPI:  Malik Rojas is a 84 y.o. male with history of HTN, DMT2, chronic combined CHF, NICM s/p PPM, recent URI symptoms; who was admitted on 08/11/2017 after cardiac arrest. EMS noted faint pulse with VF as initial rhythm. He required 37  Minutes CPR with ACLS protocol and pulse regained on arrival to ED. He was responsive and intubated at admission. Not felt to be a candidate for cardiac cath and treated with cooling. 2D echo showed diffuse hypokinesis, severe calcification on MV with mild MVR and severe LVH and EF 25-30 %.  CT head showed global atrophy, no hemorrhage and poor definition of anterolateral thalamus and lenticular nucleus question due to changes of hypoxia. Blood cultures X 2 negative. He was treated with diuresis due to fluid overload and tolerated extubation without difficulty on 01/14.    He was found to have DVT  Right CFV to distal iliac vein and started on IV heparin bridge to coumadin.   Cardiac arrest felt to be due to respiratory event leading cardiopulmonary arrest and question plans for ICD once stable.   He was placed on dysphagia 2 diet with nectar liquids due to dysphagia. CHF compensated and AKI resolving. Repeat cardiac echo 1/20 revealed EF improved to 40-45% and no plans for ischemic work up.  Has mild leucocytosis without evidence of infection. Confusion and lethargy is resolving but patient noted to be debilitated with evidence of anoxic encephalopathy. CIR recommended due to functional deficits.    Review of Systems  Constitutional: Negative for diaphoresis.  HENT: Positive for hearing loss.   Eyes: Negative for blurred vision and double vision.  Respiratory: Negative for cough and shortness of breath.   Cardiovascular: Negative for chest pain and palpitations.  Gastrointestinal: Negative for constipation, heartburn and nausea.    Genitourinary: Positive for frequency. Negative for dysuria and urgency.  Musculoskeletal: Negative for back pain and myalgias.  Skin: Negative for rash.  Neurological: Negative for dizziness and headaches.  Psychiatric/Behavioral: Positive for memory loss.      Past Medical History:  Diagnosis Date  . Basal cell carcinoma    "scalp, arms, right face;  some cut, some burndt off"  . CAD (coronary artery disease)    LHC 3/14: Distal left main 10%, LAD 20%, circumflex 10-20%, proximal RCA 20%, distal 40-50%, EF 50-55%.  . Chronic combined systolic and diastolic CHF, NYHA class 4 ([place]) 11/06/2016   Echo 4/18: severe LVH, EF 45-50, global HK, Gr 1 DD, mild AS (mean 9/peak 17), MAC, mod LAE, trivial eff  . Complete heart block (HCC)    Status post Medtronic pacemaker  . High cholesterol   . HTN (hypertension)   . Hx of cardiovascular stress test    a. [place] MV 3/14:  Low risk, EF 36%, apical inf scar, no ischemia, inf-apical HK>> catheterization however demonstrated normal left ventricular function  . Melanoma ([place])    back  . Obesity   . Pneumonia 1940  . Presence of permanent cardiac pacemaker    Medtronic  . Type II diabetes mellitus ([place])   . Urinary frequency   . Urinary incontinence     Past Surgical History:  Procedure Laterality Date  . APPENDECTOMY  40 years ago  . BASAL CELL CARCINOMA EXCISION     "scalp, right face"  . INSERT / REPLACE / REMOVE PACEMAKER  ~ 1999  medtronic EnRhythm  . MOHS SURGERY Left    ear; being monitored-every 6 months  . PERMANENT PACEMAKER GENERATOR CHANGE N/A 02/12/2013   Procedure: PERMANENT PACEMAKER GENERATOR CHANGE;  Surgeon: Deboraha Sprang, MD;  Location: [facility] CATH LAB;  Service: Cardiovascular;  Laterality: N/A;  . TONSILLECTOMY     as child  . TRANSURETHRAL RESECTION OF PROSTATE N/A 10/13/2014   Procedure: WOLF TRANSURETHRAL RESECTION OF THE PROSTATE (TURP);  Surgeon: Carolan Clines, MD;  Location: [facility] ORS;  Service: Urology;   Laterality: N/A;    Family History  Problem Relation Age of Onset  . Leukemia Mother     Social History:   Married. Retired Administrator. He reports that he quit smoking about 44 years ago. He has a 90.00 pack-year smoking history. he has never used smokeless tobacco. He reports that he drinks alcohol on rare occasions. He reports that he does not use drugs.    Allergies: No Known Allergies    Medications Prior to Admission  Medication Sig Dispense Refill  . carvedilol (COREG) 25 MG tablet Take 1 tablet (25 mg total) 2 (two) times daily by mouth. 180 tablet 3  . isosorbide mononitrate (IMDUR) 60 MG 24 hr tablet Take 1 tablet (60 mg total) by mouth daily. 90 tablet 3  . lisinopril (PRINIVIL,ZESTRIL) 20 MG tablet Take 1 tablet (20 mg total) by mouth 2 (two) times daily. 180 tablet 3  . aspirin 81 MG tablet Take 81 mg by mouth daily.    Marland Kitchen CINNAMON PO Take 1,000 mg by mouth 2 (two) times daily as needed.    . furosemide (LASIX) 40 MG tablet Take 1 tablet (40 mg total) by mouth daily. 90 tablet 3  . glucose blood (ONE TOUCH ULTRA TEST) test strip TEST twice a day DX E11.9 200 each 2  . Insulin Glargine (TOUJEO MAX SOLOSTAR) 300 UNIT/ML SOPN Inject 12 Units into the skin daily. 3 pen 3  . Insulin Pen Needle (BD ULTRA-FINE MICRO PEN NEEDLE) 32G X 6 MM MISC Patient is to use one pen needle per day DX E11.9 100 each 3  . Omega-3 Fatty Acids (FISH OIL) 1000 MG CAPS Take 1,000 mg by mouth daily.     Glory Rosebush DELICA LANCETS 52W MISC TEST TWICE DAILY DX E11.9 200 each 3  . potassium chloride (K-DUR) 10 MEQ tablet Take 10 mEq by mouth daily.    . pravastatin (PRAVACHOL) 80 MG tablet Take 1 tablet (80 mg total) by mouth daily. 90 tablet 1  . psyllium (REGULOID) 0.52 g capsule Take 0.52 g by mouth daily.    . Pumpkin Seed-Soy Germ (AZO BLADDER CONTROL/GO-LESS) CAPS Take 1 capsule by mouth daily.    . silver sulfADIAZINE (SILVADENE) 1 % cream Apply 1 application topically daily as needed (LEG SORE).     . [DISCONTINUED] sulfamethoxazole-trimethoprim (BACTRIM DS,SEPTRA DS) 800-160 MG tablet Take 1 tablet by mouth 2 (two) times daily. (Patient not taking: Reported on 07/22/2017) 20 tablet 0    Drug Regimen Review  Drug regimen was reviewed and remains appropriate with no significant issues identified  Home: Home Living Family/patient expects to be discharged to:: Other (Comment)(SNF vs CIR) Living Arrangements: Spouse/significant other Available Help at Discharge: Family, Available 24 hours/day Type of Home: House Home Access: Stairs to enter [employer] of Steps: 2 Home Layout: One level Additional Comments: Spouse is able to assist, has been helping at times with ADL's   Functional History: Prior Function Level of Independence: Independent Comments: has cane, used  it past few days before admission due to SOb  Functional Status:  Mobility: Bed Mobility Overal bed mobility: Needs Assistance Bed Mobility: Supine to Sit Rolling: Mod assist, Max assist Sidelying to sit: Mod assist, HOB elevated Supine to sit: Min assist, HOB elevated Sit to supine: Mod assist General bed mobility comments: Pt in recliner on arrival this pm.   Transfers Overall transfer level: Needs assistance Equipment used: Rolling walker (2 wheeled) Transfer via Lift Equipment: Stedy Transfers: Sit to/from Stand Sit to Stand: Mod assist Squat pivot transfers: Max assist, +2 physical assistance General transfer comment: Cues for hand placement to and from seated surface.  Pt slow to follow commands and at time unable to recall cueing from therapist.  Pt performed 2 min standing trial with intermittent flexion of B knees and trunk.  Pt tolerated standing trials for increased length of time but remains unable to progress to gait due to slow buckling in standing. Ambulation/Gait Ambulation/Gait assistance: (Pre gait weight shifting performed at chair.  Pt took one step forward and backwards with mod  assist +1.  ) General Gait Details: unable this date    ADL: ADL Overall ADL's : Needs assistance/impaired Grooming: Wash/dry hands, Wash/dry face, Min guard, Sitting Upper Body Dressing : Minimal assistance, Sitting Toilet Transfer: Moderate assistance, RW, Stand-pivot Toilet Transfer Details (indicate cue type and reason): simulated to recliner SPT Toileting- Clothing Manipulation and Hygiene: Total assistance, Sit to/from stand General ADL Comments: pt required increased time to initiate sit - stand and required multimodal cues for sequencing and direction for UB ADLs, grooming and to transfer to recliner  Cognition: Cognition Overall Cognitive Status: Impaired/Different from baseline Orientation Level: Oriented to person, Oriented to place, Oriented to situation Cognition Arousal/Alertness: Awake/alert Behavior During Therapy: WFL for tasks assessed/performed Overall Cognitive Status: Impaired/Different from baseline Area of Impairment: Following commands, Safety/judgement, Problem solving Following Commands: Follows one step commands with increased time Safety/Judgement: Decreased awareness of deficits Problem Solving: Slow processing, Difficulty sequencing, Requires verbal cues, Requires tactile cues General Comments: increased time to intiate and complete tasks when instructed  Physical Exam: Blood pressure (!) 142/59, pulse 63, temperature 98.2 F (36.8 C), temperature source Oral, resp. rate 18, height 5' 10"  (1.778 m), weight 96.3 kg (212 lb 4.9 oz), SpO2 96 %. Physical Exam  Nursing note and vitals reviewed. Constitutional: He is oriented to person, place, and time. He appears well-developed and well-nourished. He is sleeping. He is easily aroused. No distress.  HENT:  Head: Normocephalic and atraumatic.  Mouth/Throat: Oropharynx is clear and moist.  Eyes: Conjunctivae and EOM are normal. Pupils are equal, round, and reactive to light.  Neck: Normal range of motion.  Neck supple.  Cardiovascular: Normal rate and regular rhythm.  Respiratory: Effort normal and breath sounds normal. No stridor. No respiratory distress. He has no wheezes.  GI: Soft. Bowel sounds are normal. He exhibits no distension. There is no tenderness.  Musculoskeletal: He exhibits edema.  Edema RLE. Stasis changes BLE with flaky skin. Scabbed area on left shin with dry bloody drainage on dressing.   Neurological: He is alert, oriented to person, place, and time and easily aroused.  Slower to process (due to fatigue per wife) today with delayed processing. Also limited by hearing loss. He was able to follow simple motor commands.   Skin: Skin is warm and dry. He is not diaphoretic.  Psychiatric: His affect is blunt. His speech is delayed and slurred. He is slowed. Cognition and memory are impaired. He expresses inappropriate  judgment.    Results for orders placed or performed during the hospital encounter of 08/11/17 (from the past 48 hour(s))  Glucose, capillary     Status: Abnormal   Collection Time: 08/24/17  4:33 PM  Result Value Ref Range   Glucose-Capillary 221 (H) 65 - 99 mg/dL   Comment 1 Notify RN   Glucose, capillary     Status: Abnormal   Collection Time: 08/24/17  8:51 PM  Result Value Ref Range   Glucose-Capillary 162 (H) 65 - 99 mg/dL   Comment 1 Notify RN    Comment 2 Document in Chart   CBC     Status: Abnormal   Collection Time: 08/25/17  4:08 AM  Result Value Ref Range   WBC 11.7 (H) 4.0 - 10.5 K/uL    Comment: WHITE COUNT CONFIRMED ON SMEAR   RBC 4.28 4.22 - 5.81 MIL/uL   Hemoglobin 13.4 13.0 - 17.0 g/dL   HCT 42.0 39.0 - 52.0 %   MCV 98.1 78.0 - 100.0 fL   MCH 31.3 26.0 - 34.0 pg   MCHC 31.9 30.0 - 36.0 g/dL   RDW 14.6 11.5 - 15.5 %   Platelets 271 150 - 400 K/uL  Basic metabolic panel     Status: Abnormal   Collection Time: 08/25/17  4:08 AM  Result Value Ref Range   Sodium 143 135 - 145 mmol/L   Potassium 5.1 3.5 - 5.1 mmol/L    Comment: SPECIMEN  HEMOLYZED. HEMOLYSIS MAY AFFECT INTEGRITY OF RESULTS.   Chloride 104 101 - 111 mmol/L   CO2 24 22 - 32 mmol/L   Glucose, Bld 191 (H) 65 - 99 mg/dL   BUN 128 (H) 6 - 20 mg/dL   Creatinine, Ser 3.30 (H) 0.61 - 1.24 mg/dL   Calcium 9.0 8.9 - 10.3 mg/dL   GFR calc non Af Amer 16 (L) >60 mL/min   GFR calc Af Amer 18 (L) >60 mL/min    Comment: (NOTE) The eGFR has been calculated using the CKD EPI equation. This calculation has not been validated in all clinical situations. eGFR's persistently <60 mL/min signify possible Chronic Kidney Disease.    Anion gap 15 5 - 15  Heparin level (unfractionated)     Status: None   Collection Time: 08/25/17  6:58 AM  Result Value Ref Range   Heparin Unfractionated 0.59 0.30 - 0.70 IU/mL    Comment:        IF HEPARIN RESULTS ARE BELOW EXPECTED VALUES, AND PATIENT DOSAGE HAS BEEN CONFIRMED, SUGGEST FOLLOW UP TESTING OF ANTITHROMBIN III LEVELS.   Protime-INR     Status: Abnormal   Collection Time: 08/25/17  6:58 AM  Result Value Ref Range   Prothrombin Time 21.0 (H) 11.4 - 15.2 seconds   INR 1.83   Glucose, capillary     Status: Abnormal   Collection Time: 08/25/17  7:46 AM  Result Value Ref Range   Glucose-Capillary 165 (H) 65 - 99 mg/dL   Comment 1 Notify RN   Glucose, capillary     Status: Abnormal   Collection Time: 08/25/17 11:03 AM  Result Value Ref Range   Glucose-Capillary 229 (H) 65 - 99 mg/dL   Comment 1 Notify RN   Glucose, capillary     Status: Abnormal   Collection Time: 08/25/17  4:39 PM  Result Value Ref Range   Glucose-Capillary 261 (H) 65 - 99 mg/dL   Comment 1 Notify RN   Glucose, capillary     Status: Abnormal  Collection Time: 08/25/17  8:57 PM  Result Value Ref Range   Glucose-Capillary 124 (H) 65 - 99 mg/dL   Comment 1 Notify RN    Comment 2 Document in Chart   Protime-INR     Status: Abnormal   Collection Time: 09/02/2017  4:47 AM  Result Value Ref Range   Prothrombin Time 22.6 (H) 11.4 - 15.2 seconds   INR 2.01    Heparin level (unfractionated)     Status: Abnormal   Collection Time: 08/16/2017  4:47 AM  Result Value Ref Range   Heparin Unfractionated <0.10 (L) 0.30 - 0.70 IU/mL    Comment:        IF HEPARIN RESULTS ARE BELOW EXPECTED VALUES, AND PATIENT DOSAGE HAS BEEN CONFIRMED, SUGGEST FOLLOW UP TESTING OF ANTITHROMBIN III LEVELS.   Basic metabolic panel     Status: Abnormal   Collection Time: 08/09/2017  4:47 AM  Result Value Ref Range   Sodium 143 135 - 145 mmol/L   Potassium 4.7 3.5 - 5.1 mmol/L   Chloride 106 101 - 111 mmol/L   CO2 25 22 - 32 mmol/L   Glucose, Bld 166 (H) 65 - 99 mg/dL   BUN 108 (H) 6 - 20 mg/dL   Creatinine, Ser 2.95 (H) 0.61 - 1.24 mg/dL   Calcium 9.0 8.9 - 10.3 mg/dL   GFR calc non Af Amer 18 (L) >60 mL/min   GFR calc Af Amer 21 (L) >60 mL/min    Comment: (NOTE) The eGFR has been calculated using the CKD EPI equation. This calculation has not been validated in all clinical situations. eGFR's persistently <60 mL/min signify possible Chronic Kidney Disease.    Anion gap 12 5 - 15  Magnesium     Status: None   Collection Time: 08/18/2017  4:47 AM  Result Value Ref Range   Magnesium 2.3 1.7 - 2.4 mg/dL  Glucose, capillary     Status: Abnormal   Collection Time: 08/28/2017  7:18 AM  Result Value Ref Range   Glucose-Capillary 186 (H) 65 - 99 mg/dL   Comment 1 Notify RN    No results found.     Medical Problem List and Plan: 1.  Functional and cognitive deficits secondary to anoxic brain injury and debility 2.  Right femoral vein/iliac veinDVT /Anticoagulation: Pharmaceutical: Coumadin with  Heparin bridge 3. Pain Management: Tylenol.  Limit neuro sedating medications due to arousal 4. Mood: Team to provide ego support and motivation as possible.  Social worker to screen. 5. Neuropsych: This patient is not fully capable of making decisions on his own behalf.    Normalize sleep-wake patterns 6. Skin/Wound Care: Routine pressure relief measures 7.  Fluids/Electrolytes/Nutrition: Monitor I/O with serial lytes. Continues on nectar liquids due to dysphagia.  8. HTN: Monitor BP bid. On coreg, hydralazine and Isordil.  9. Acute on chronic combined CHF: Monitor weights daily. Strict I/O. No ACE/ARB due to CKD. Continue coreg, hydralazine, Isordil and pravastatin.  10. Acute on chronic renal failure: Continue to monitor renal status serially. BUN/SCR peaked at 137/4.4 and now trending down to  108/2.95.  11. T2DM: Monitor BS ac/hs. On lantus 10 units bid with SSI for elevated BS.  12. Leucocytosis: Monitor for signs of infection.     Post Admission Physician Evaluation: 1. Functional deficits secondary  to anoxic brain injury and debility. 2. Patient is admitted to receive collaborative, interdisciplinary care between the physiatrist, rehab nursing staff, and therapy team. 3. Patient's level of medical complexity and substantial therapy  needs in context of that medical necessity cannot be provided at a lesser intensity of care such as a SNF. 4. Patient has experienced substantial functional loss from his/her baseline which was documented above under the "Functional History" and "Functional Status" headings.  Judging by the patient's diagnosis, physical exam, and functional history, the patient has potential for functional progress which will result in measurable gains while on inpatient rehab.  These gains will be of substantial and practical use upon discharge  in facilitating mobility and self-care at the household level. 5. Physiatrist will provide 24 hour management of medical needs as well as oversight of the therapy plan/treatment and provide guidance as appropriate regarding the interaction of the two. 6. The Preadmission Screening has been reviewed and patient status is unchanged unless otherwise stated above. 7. 24 hour rehab nursing will assist with bladder management, bowel management, safety, skin/wound care, disease management, medication  administration and patient education  and help integrate therapy concepts, techniques,education, etc. 8. PT will assess and treat for/with: Lower extremity strength, range of motion, stamina, balance, functional mobility, safety, adaptive techniques and equipment, neuromuscular reeducation, family education.   Goals are: Supervision to minimal assistance. 9. OT will assess and treat for/with: ADL's, functional mobility, safety, upper extremity strength, adaptive techniques and equipment, NMR, family education, .   Goals are: Supervision to minimal assistance. Therapy may proceed with showering this patient. 10. SLP will assess and treat for/with: Cognition, communication, swallowing, family education.  Goals are: Supervision to minimal assistance. 11. Case Management and Social Worker will assess and treat for psychological issues and discharge planning. 12. Team conference will be held weekly to assess progress toward goals and to determine barriers to discharge. 13. Patient will receive at least 3 hours of therapy per day at least 5 days per week. 14. ELOS: 22-28 days       15. Prognosis:  good     Meredith Staggers, MD, [facility] 08/25/2017  Bary Leriche, PA-C 08/24/2017

## 2017-08-25 NOTE — Progress Notes (Signed)
Pt had quiet night no signs distress to report.  Lab called to report am lab draw coagulated stated they placed order for redraw.

## 2017-08-25 NOTE — Progress Notes (Addendum)
PROGRESS NOTE    Malik Rojas  BJY:[id number] DOB: July 10, 1932 DOA: 08/11/2017 PCP: Denita Lung, MD    Brief Narrative:  84 year old male who was admitted to the intensive care unit1/7/2019after sustaining a cardiac arrest, apparently ventricular fibrillation. Apparently patient complained of acute on chronic dyspnea and he asked his wife to call 911, his dyspnea, progressively worse, to the point where he lost consciousness immediately after EMS arrived. He received CPR for 58 minutes,with recovery of spontaneous circulation.Patient was intubated,placed on mechanical ventilation,and underwent hypothermic protocol.This time of initial physical examination, patient was sedated paralyzed. His blood pressure was 160/72, heart rate 60, temperature 96.1, respiratory 16, oxygen saturation 99% on invasive mechanical ventilation. He had coarse breath sounds bilaterally, heart S1-S2 present, creatinine, no gallops, rubs or murmurs, the was soft nontender, positive nonpitting bilateral lower extremity edema. His chest x-ray showed cardiomegaly with bilateral interstitial markings consistent with pulmonary edema.   Here recovered well,extubated1/14.  His LV function was noted to be decreased,apparently he developed ATN,which seems to be stable and improving.  Patient was successfully transferred to the telemetry unit for further care. Waiting for inpatient rehab evaluation.   Assessment & Plan:   Principal Problem:   Cardiac arrest ([place]) Active Problems:   Hypertensive heart disease with CHF (congestive heart failure) (HCC)   Diabetes mellitus due to underlying condition with diabetic nephropathy, without long-term current use of insulin (HCC)   Acute on chronic combined systolic and diastolic CHF, NYHA class 3 (HCC)   Acute hypercapnic respiratory failure (HCC)   Uncontrolled hypertension   Acute pulmonary edema (HCC)   Acute renal failure superimposed on stage 3 chronic kidney  disease ([place])   Respiratory failure ([place])   Palliative care by specialist   DNR (do not resuscitate)   Weakness generalized   Pressure injury of skin    1.Systolic heart failureacute on chronic decompensation.Continue heart failure management with coreg, asa, pravastatin, hydralazine and isosorbide.No ace inh due to decreased GFR and risk of AKI. Follow up echocardiogram showed ejection fraction on LV of 40 to 45%, improved from 1/8 that was 20 to 30%  2.Hypoxic respiratory failure/ acute.Patient oxygenating well , will continue supplemental 02 per  to target 02 saturation greater than 92%.  3.Acute kidney injury/ATN.renal function continue to improve per cr, today down to 3,30 from 3,4, mild elevation in K at 5,1 from 4,6 questionable sample hemolysis, will repeat renal function in am. Continue supportive medical therapy.   4.DVT right common femoral vein. Heparin IV bridging per pharmacy protocol, INR at 1,83, target 2 to 3.   5.Type 2 diabetes mellitus. capillary glucose 253, 221, 162, 165, 229. Continue basal insulin 10 units bid, plus insulin sliding scale for glucose cover and monitoring.  DVT prophylaxis:heparin IV Code Status:dnr Family Communication:No family at bedside Disposition Plan:CIR/ SNF   Consultants:  Cardiology  Procedures:    Antimicrobials:      Subjective:    Subjective: Patient feeling well, positive generalized weakness, anxious to be discharge to rehab, no nausea or vomiting, no chest pain or dyspnea.   Objective: Vitals:   08/24/17 1211 08/24/17 1634 08/24/17 1923 08/25/17 0522  BP: (!) 113/47 (!) 152/51 (!) 156/64 (!) 137/49  Pulse: 60  79 64  Resp: 20  18 18   Temp: (!) 97.5 F (36.4 C)  98.2 F (36.8 C) 97.6 F (36.4 C)  TempSrc: Oral  Oral Oral  SpO2: 94%  92% 94%  Weight:    96.7 kg (213 lb 3 oz)  Height:        Intake/Output Summary (Last 24 hours) at 08/25/2017 1051 Last data filed at  08/25/2017 1031 Gross per 24 hour  Intake 1521.5 ml  Output 850 ml  Net 671.5 ml   Filed Weights   08/23/17 0549 08/24/17 0610 08/25/17 0522  Weight: 95.4 kg (210 lb 5.1 oz) 91.5 kg (201 lb 11.5 oz) 96.7 kg (213 lb 3 oz)    Examination:   General: Not in pain or dyspnea, deconditioned Neurology: Awake and alert, non focal  E ENT: mild pallor, no icterus, oral mucosa moist Cardiovascular: No JVD. S1-S2 present, rhythmic, no gallops, rubs, or murmurs. Trace lower extremity edema. Pulmonary: vesicular breath sounds bilaterally, adequate air movement, no wheezing, rhonchi or rales. Mild decreased breath sounds at bases.  Gastrointestinal. Abdomen protuberant, no organomegaly, non tender, no rebound or guarding Skin. No rashes Musculoskeletal: no joint deformities   Data Reviewed: I have personally reviewed following labs and imaging studies  CBC: Recent Labs  Lab 08/19/17 0149 08/20/17 0402 08/21/17 8466 08/22/17 0226 08/22/17 1843 08/23/17 0833 08/24/17 1132 08/25/17 0408  WBC 14.5* 15.0* 12.4* 10.9* 11.8* 12.5* 9.4 11.7*  NEUTROABS 11.2* 12.3* 10.1*  --   --   --   --   --   HGB 13.5 14.6 14.9 13.4 13.7 14.3 12.8* 13.4  HCT 41.6 44.7 46.0 41.4 42.2 44.8 40.3 42.0  MCV 95.4 97.6 97.9 97.0 97.5 98.2 98.3 98.1  PLT 301 295 317 286 320 267 253 599   Basic Metabolic Panel: Recent Labs  Lab 08/19/17 0149 08/20/17 0402 08/21/17 0614 08/22/17 0226 08/22/17 1843 08/24/17 1132 08/25/17 0408  NA 148* 149* 148* 146*  --  142 143  K 4.4 4.3 3.5 4.0  --  4.6 5.1  CL 107 106 105 106  --  105 104  CO2 26 27 26 27   --  24 24  GLUCOSE 152* 151* 175* 243*  --  286* 191*  BUN 121* 137* 137* 141*  --  127* 128*  CREATININE 4.37* 4.43* 4.10* 3.88* 3.78* 3.43* 3.30*  CALCIUM 8.7* 9.3 9.1 8.7*  --  8.7* 9.0  MG 2.3 2.5* 2.4  --   --   --   --   PHOS 6.6* 6.8* 5.9*  --   --   --   --    GFR: Estimated Creatinine Clearance: 19.8 mL/min (A) (by C-G formula based on SCr of 3.3 mg/dL  (H)). Liver Function Tests: No results for input(s): AST, ALT, ALKPHOS, BILITOT, PROT, ALBUMIN in the last 168 hours. No results for input(s): LIPASE, AMYLASE in the last 168 hours. No results for input(s): AMMONIA in the last 168 hours. Coagulation Profile: Recent Labs  Lab 08/21/17 0614 08/22/17 0226 08/23/17 0833 08/24/17 1132 08/25/17 0658  INR 1.17 1.49 1.75 1.88 1.83   Cardiac Enzymes: No results for input(s): CKTOTAL, CKMB, CKMBINDEX, TROPONINI in the last 168 hours. BNP (last 3 results) No results for input(s): PROBNP in the last 8760 hours. HbA1C: No results for input(s): HGBA1C in the last 72 hours. CBG: Recent Labs  Lab 08/24/17 0742 08/24/17 1134 08/24/17 1633 08/24/17 2051 08/25/17 0746  GLUCAP 184* 253* 221* 162* 165*   Lipid Profile: No results for input(s): CHOL, HDL, LDLCALC, TRIG, CHOLHDL, LDLDIRECT in the last 72 hours. Thyroid Function Tests: No results for input(s): TSH, T4TOTAL, FREET4, T3FREE, THYROIDAB in the last 72 hours. Anemia Panel: No results for input(s): VITAMINB12, FOLATE, FERRITIN, TIBC, IRON, RETICCTPCT in the last 72 hours.  Radiology Studies: I have reviewed all of the imaging during this hospital visit personally     Scheduled Meds: . aspirin  81 mg Per Tube Daily  . carvedilol  25 mg Per Tube BID  . hydrALAZINE  25 mg Oral Q8H  . insulin aspart  0-9 Units Subcutaneous TID WC  . insulin glargine  10 Units Subcutaneous BID  . isosorbide dinitrate  10 mg Oral TID  . mouth rinse  15 mL Mouth Rinse q12n4p  . multivitamin with minerals  1 tablet Oral Daily  . pantoprazole  40 mg Oral QHS  . pravastatin  80 mg Per Tube q1800  . warfarin  7.5 mg Oral ONCE-1800  . Warfarin - Pharmacist Dosing Inpatient   Does not apply q1800   Continuous Infusions: . heparin 1,150 Units/hr (08/24/17 1157)     LOS: 14 days        Cindra Austad Gerome Apley, MD [facility] Pager [phone]

## 2017-08-25 NOTE — Progress Notes (Signed)
Patient ID: Malik Rojas, male   DOB: [birth year], 84 y.o.   MRN: [id number]  This NP visited patient at the bedside as a follow up for palliative medicine needs and emotional support.  Patient is out of bed to chair, alert and oriented, and appears stronger today. Wife at bedside.  Discussed the importance of nutrition and hydration and overall well-being, as well as mobility.  Demonstrated AROM exercises  Both patient and his wife verbalized an understanding of the seriousness of the current medical situation.  However they remain hopeful for improvement and home that patient will be able to receive rehabilitation services here in the hospital/CIR.  Discussed with family the importance of continued conversation with family and their  medical providers regarding overall plan of care and treatment options,  ensuring decisions are within the context of the patients values and GOCs.  Questions and concerns addressed.  Discussed with Dr. Cathlean Sauer  Time in 1100          Time out  1130    Total time spent on the unit was 30 minutes minutes  Greater than 50% of the time was spent in counseling and coordination of care  Wadie Lessen NP  Palliative Medicine Team Team Phone # [phone] Pager [phone]

## 2017-08-25 NOTE — Progress Notes (Signed)
Physical Therapy Treatment Patient Details Name: Malik Rojas MRN: [id number] DOB: 04/04/1932 Today's Date: 08/25/2017    History of Present Illness Pt is a 84 yo male admitted 1/7 s/p cardiac arrest at home. downtime was 37 minutes. PMH: baseal cell carcinoma, CAG, CH, HTN, high cholesterol    PT Comments    Pt performed standing trial with progression to stand at [facility].  Pt performed pre gait activities but not safe to progress to gait without a second person.  Slow buckling observed in standing with cues for upper trunk control and knee extension.  Unclear if patient limited due to strength or coordination as he has difficulty following commands. Pt is progressing well and will be an excellent candidate for CIR therapies.  If CIR is not an option he will require SNF as a back-up.      Follow Up Recommendations  CIR     Equipment Recommendations  None recommended by PT(TBA at next venue)    Recommendations for Other Services       Precautions / Restrictions Precautions Precautions: Fall Restrictions Weight Bearing Restrictions: No    Mobility  Bed Mobility               General bed mobility comments: Pt in recliner on arrival this pm.    Transfers Overall transfer level: Needs assistance Equipment used: Rolling walker (2 wheeled) Transfers: Sit to/from Stand Sit to Stand: Mod assist         General transfer comment: Cues for hand placement to and from seated surface.  Pt slow to follow commands and at time unable to recall cueing from therapist.  Pt performed 2 min standing trial with intermittent flexion of B knees and trunk.  Pt tolerated standing trials for increased length of time but remains unable to progress to gait due to slow buckling in standing.  Ambulation/Gait Ambulation/Gait assistance: (Pre gait weight shifting performed at chair.  Pt took one step forward and backwards with mod assist +1.  )               Stairs             Wheelchair Mobility    Modified Rankin (Stroke Patients Only)       Balance     Sitting balance-Leahy Scale: Poor       Standing balance-Leahy Scale: Poor                              Cognition Arousal/Alertness: Awake/alert Behavior During Therapy: WFL for tasks assessed/performed Overall Cognitive Status: Impaired/Different from baseline Area of Impairment: Following commands;Safety/judgement;Problem solving                       Following Commands: Follows one step commands with increased time Safety/Judgement: Decreased awareness of deficits   Problem Solving: Slow processing;Difficulty sequencing;Requires verbal cues;Requires tactile cues General Comments: increased time to intiate and complete tasks when instructed      Exercises General Exercises - Lower Extremity Quad Sets: AROM;Both;10 reps;Supine Long Arc Quad: AROM;Both;10 reps;Seated Hip ABduction/ADduction: AROM;Both;10 reps;Supine Hip Flexion/Marching: AROM;Both;10 reps;Seated    General Comments        Pertinent Vitals/Pain Pain Assessment: No/denies pain    Home Living                      Prior Function  PT Goals (current goals can now be found in the care plan section) Acute Rehab PT Goals Patient Stated Goal: none stated Potential to Achieve Goals: Good Progress towards PT goals: Progressing toward goals    Frequency    Min 3X/week      PT Plan Current plan remains appropriate    Co-evaluation              AM-PAC PT "6 Clicks" Daily Activity  Outcome Measure  Difficulty turning over in bed (including adjusting bedclothes, sheets and blankets)?: Unable Difficulty moving from lying on back to sitting on the side of the bed? : Unable Difficulty sitting down on and standing up from a chair with arms (e.g., wheelchair, bedside commode, etc,.)?: Unable Help needed moving to and from a bed to chair (including a wheelchair)?: A  Lot Help needed walking in hospital room?: A Lot Help needed climbing 3-5 steps with a railing? : Total 6 Click Score: 8    End of Session Equipment Utilized During Treatment: Gait belt Activity Tolerance: Patient tolerated treatment well Patient left: with call bell/phone within reach;with nursing/sitter in room;in bed;with bed alarm set Nurse Communication: Mobility status PT Visit Diagnosis: Unsteadiness on feet (R26.81);Muscle weakness (generalized) (M62.81)     Time: 6226-3335 PT Time Calculation (min) (ACUTE ONLY): 25 min  Charges:  $Therapeutic Exercise: 8-22 mins $Therapeutic Activity: 8-22 mins                    G Codes:       Governor Rooks, PTA pager [phone]    Cristela Blue 08/25/2017, 5:17 PM

## 2017-08-25 NOTE — Progress Notes (Signed)
Inpatient Diabetes Program Recommendations  AACE/ADA: New Consensus Statement on Inpatient Glycemic Control (2015)  Target Ranges:  Prepandial:   less than 140 mg/dL      Peak postprandial:   less than 180 mg/dL (1-2 hours)      Critically ill patients:  140 - 180 mg/dL   Lab Results  Component Value Date   GLUCAP 229 (H) 08/25/2017   HGBA1C 8.0 07/22/2017    Review of Glycemic Control  Diabetes history: DM2 Outpatient Diabetes medications: Toujeo 12 units QD Current orders for Inpatient glycemic control: Novolog 0-9 units tidwc, Lantus 10 units bid  Inpatient Diabetes Program Recommendations:    Noted postprandial CBGs elevated. Add Novolog 3 units tidwc for meal coverage insulin if pt eats > 50% meal.  Thank you, Nani Gasser. Yehonatan Grandison, RN, MSN, CDE  Diabetes Coordinator Inpatient Glycemic Control Team Team Pager [phone] (8am-5pm) 08/25/2017 11:42 AM

## 2017-08-25 NOTE — Progress Notes (Signed)
  Speech Language Pathology Treatment: Dysphagia  Patient Details Name: Malik Rojas MRN: [id number] DOB: Jan 14, 1932 Today's Date: 08/25/2017 Time: 3086-5784 SLP Time Calculation (min) (ACUTE ONLY): 30 min  Assessment / Plan / Recommendation Clinical Impression  Pt seen at bedside for assessment of diet tolerance, education, and possible diet upgrade. Nursing reports no difficulty with liquids, however, pt is not eating much, due to dislike of pureed foods. Pt was observed with graham cracker and nectar thick liquids. Pt was encouraged to take small bites and sips, alternate solids and liquids, and check oral cavity frequently. Recommend advancing diet back to Dys 2/nectar per MBS recommendations, as this may facilitate adequate po intake. FULL supervision is recommended during po intake to insure adherence to safe swallow precautions. Pt/wife and RN were informed of these recommendations.  ST will continue to follow closely for assessment of diet tolerance and education.    HPI HPI: This is a 84 year old man history of coronary artery disease, DM, HTN, complete heart block, pacemaker presents today status post cardiac arrest.Per chart resuscitation was 37 minutes with paced rhythm followed by return of pulses. Intubated 1/7-1/14. CXR 1/15 Aortic atherosclerosis. Endotracheal and nasogastric tubes have been removed. Stable bibasilar atelectasis or edema is noted with associated pleural effusions. CXR 1/14 CHF with slight interval decrease in interstitial edema. Persistent bibasilar pleural effusions layering posteriorly.      SLP Plan  Continue with current plan of care       Recommendations  Diet recommendations: Dysphagia 2 (fine chop);Nectar-thick liquid Liquids provided via: Straw;Cup Medication Administration: Whole meds with puree Supervision: Patient able to self feed;Full supervision/cueing for compensatory strategies Compensations: Slow rate;Small sips/bites;Lingual sweep for  clearance of pocketing;Minimize environmental distractions Postural Changes and/or Swallow Maneuvers: Seated upright 90 degrees;Out of bed for meals                Oral Care Recommendations: Oral care BID Follow up Recommendations: (tbd) SLP Visit Diagnosis: Dysphagia, oropharyngeal phase (R13.12) Plan: Continue with current plan of care       Kaniel Kiang B. Quentin Ore [facility], CCC-SLP Speech Language Pathologist [phone]  Shonna Chock 08/25/2017, 3:11 PM

## 2017-08-25 NOTE — Progress Notes (Signed)
[place] for Heparin, coumadin  Indication: DVT  No Known Allergies  Patient Measurements: Height: _0  (177.8 cm) Weight: 213 lb 3 oz (96.7 kg) IBW/kg (Calculated) : 73  HDWt: 92.1 kg  Vital Signs: Temp: 97.6 F (36.4 C) (01/21 0522) Temp Source: Oral (01/21 0522) BP: 137/49 (01/21 0522) Pulse Rate: 64 (01/21 0522)  Labs: Recent Labs    08/22/17 1843 08/23/17 0833 08/23/17 0834 08/24/17 1132 08/25/17 0408 08/25/17 0658  HGB 13.7 14.3  --  12.8* 13.4  --   HCT 42.2 44.8  --  40.3 42.0  --   PLT 320 267  --  253 271  --   LABPROT  --  20.3*  --  21.5*  --  21.0*  INR  --  1.75  --  1.88  --  1.83  HEPARINUNFRC  --   --  0.54 0.47  --  0.59  CREATININE 3.78*  --   --  3.43* 3.30*  --     Estimated Creatinine Clearance: 19.8 mL/min (A) (by C-G formula based on SCr of 3.3 mg/dL (H)).   Medical History: Past Medical History:  Diagnosis Date  . Basal cell carcinoma    "scalp, arms, right face;  some cut, some burndt off"  . CAD (coronary artery disease)    LHC 3/14: Distal left main 10%, LAD 20%, circumflex 10-20%, proximal RCA 20%, distal 40-50%, EF 50-55%.  . Chronic combined systolic and diastolic CHF, NYHA class 4 ([place]) 11/06/2016   Echo 4/18: severe LVH, EF 45-50, global HK, Gr 1 DD, mild AS (mean 9/peak 17), MAC, mod LAE, trivial eff  . Complete heart block (HCC)    Status post Medtronic pacemaker  . High cholesterol   . HTN (hypertension)   . Hx of cardiovascular stress test    a. [place] MV 3/14:  Low risk, EF 36%, apical inf scar, no ischemia, inf-apical HK>> catheterization however demonstrated normal left ventricular function  . Melanoma ([place])    back  . Obesity   . Pneumonia 1940  . Presence of permanent cardiac pacemaker    Medtronic  . Type II diabetes mellitus ([place])   . Urinary frequency   . Urinary incontinence    Assessment: 84 year old male s/p prolonged CPR with CHF, respiratory failure, AKI  continuing on heparin and coumadin for new DVT, completed 5 days of overlap.   Heparin level remains therapeutic INR = 1.83  Goal of Therapy:  Heparin level 0.3-0.7 units/ml Monitor platelets by anticoagulation protocol: Yes   Plan:  -Continue heparin at 1150 units/hr -Coumadin 7.48m PO x 1 tonight -Daily heparin level/INR/CBC -Monitor for s/sx bleeding  Thank you LAnette Guarneri PharmD [phone]/21/2019 8:09 AM

## 2017-08-25 NOTE — Progress Notes (Signed)
I await further progress with therapy before proceeding with insurance authorization for a possible inpt rehab admit. [employer] medicare is closed today for the [facility] holiday. [phone]

## 2017-08-26 ENCOUNTER — Inpatient Hospital Stay (HOSPITAL_COMMUNITY)
Admission: RE | Admit: 2017-08-26 | Discharge: 2017-09-05 | DRG: 945 | Disposition: E | Payer: Medicare Other | Source: Intra-hospital | Attending: Physical Medicine & Rehabilitation | Admitting: Physical Medicine & Rehabilitation

## 2017-08-26 ENCOUNTER — Ambulatory Visit (INDEPENDENT_AMBULATORY_CARE_PROVIDER_SITE_OTHER): Payer: Medicare Other | Admitting: Physician Assistant

## 2017-08-26 DIAGNOSIS — R4189 Other symptoms and signs involving cognitive functions and awareness: Secondary | ICD-10-CM | POA: Diagnosis present

## 2017-08-26 DIAGNOSIS — I82421 Acute embolism and thrombosis of right iliac vein: Secondary | ICD-10-CM | POA: Diagnosis not present

## 2017-08-26 DIAGNOSIS — Z85820 Personal history of malignant melanoma of skin: Secondary | ICD-10-CM

## 2017-08-26 DIAGNOSIS — K5900 Constipation, unspecified: Secondary | ICD-10-CM | POA: Diagnosis not present

## 2017-08-26 DIAGNOSIS — E1169 Type 2 diabetes mellitus with other specified complication: Secondary | ICD-10-CM | POA: Diagnosis not present

## 2017-08-26 DIAGNOSIS — E1122 Type 2 diabetes mellitus with diabetic chronic kidney disease: Secondary | ICD-10-CM | POA: Diagnosis present

## 2017-08-26 DIAGNOSIS — Z87891 Personal history of nicotine dependence: Secondary | ICD-10-CM

## 2017-08-26 DIAGNOSIS — D72829 Elevated white blood cell count, unspecified: Secondary | ICD-10-CM | POA: Diagnosis present

## 2017-08-26 DIAGNOSIS — R1312 Dysphagia, oropharyngeal phase: Secondary | ICD-10-CM | POA: Diagnosis not present

## 2017-08-26 DIAGNOSIS — G9340 Encephalopathy, unspecified: Secondary | ICD-10-CM | POA: Diagnosis not present

## 2017-08-26 DIAGNOSIS — N184 Chronic kidney disease, stage 4 (severe): Secondary | ICD-10-CM | POA: Diagnosis present

## 2017-08-26 DIAGNOSIS — I130 Hypertensive heart and chronic kidney disease with heart failure and stage 1 through stage 4 chronic kidney disease, or unspecified chronic kidney disease: Secondary | ICD-10-CM | POA: Diagnosis present

## 2017-08-26 DIAGNOSIS — E1151 Type 2 diabetes mellitus with diabetic peripheral angiopathy without gangrene: Secondary | ICD-10-CM | POA: Diagnosis present

## 2017-08-26 DIAGNOSIS — R5381 Other malaise: Secondary | ICD-10-CM | POA: Diagnosis not present

## 2017-08-26 DIAGNOSIS — Z79899 Other long term (current) drug therapy: Secondary | ICD-10-CM

## 2017-08-26 DIAGNOSIS — Z950 Presence of cardiac pacemaker: Secondary | ICD-10-CM

## 2017-08-26 DIAGNOSIS — N179 Acute kidney failure, unspecified: Secondary | ICD-10-CM | POA: Diagnosis not present

## 2017-08-26 DIAGNOSIS — N170 Acute kidney failure with tubular necrosis: Secondary | ICD-10-CM | POA: Diagnosis not present

## 2017-08-26 DIAGNOSIS — Z9079 Acquired absence of other genital organ(s): Secondary | ICD-10-CM

## 2017-08-26 DIAGNOSIS — Z8674 Personal history of sudden cardiac arrest: Secondary | ICD-10-CM | POA: Diagnosis not present

## 2017-08-26 DIAGNOSIS — E669 Obesity, unspecified: Secondary | ICD-10-CM | POA: Diagnosis not present

## 2017-08-26 DIAGNOSIS — I428 Other cardiomyopathies: Secondary | ICD-10-CM | POA: Diagnosis present

## 2017-08-26 DIAGNOSIS — I5043 Acute on chronic combined systolic (congestive) and diastolic (congestive) heart failure: Secondary | ICD-10-CM | POA: Diagnosis present

## 2017-08-26 DIAGNOSIS — N183 Chronic kidney disease, stage 3 unspecified: Secondary | ICD-10-CM | POA: Diagnosis present

## 2017-08-26 DIAGNOSIS — Z7982 Long term (current) use of aspirin: Secondary | ICD-10-CM

## 2017-08-26 DIAGNOSIS — R0989 Other specified symptoms and signs involving the circulatory and respiratory systems: Secondary | ICD-10-CM

## 2017-08-26 DIAGNOSIS — R627 Adult failure to thrive: Secondary | ICD-10-CM | POA: Diagnosis not present

## 2017-08-26 DIAGNOSIS — E875 Hyperkalemia: Secondary | ICD-10-CM | POA: Diagnosis present

## 2017-08-26 DIAGNOSIS — Z794 Long term (current) use of insulin: Secondary | ICD-10-CM

## 2017-08-26 DIAGNOSIS — G931 Anoxic brain damage, not elsewhere classified: Secondary | ICD-10-CM | POA: Diagnosis not present

## 2017-08-26 DIAGNOSIS — I2510 Atherosclerotic heart disease of native coronary artery without angina pectoris: Secondary | ICD-10-CM | POA: Diagnosis not present

## 2017-08-26 DIAGNOSIS — I82411 Acute embolism and thrombosis of right femoral vein: Secondary | ICD-10-CM | POA: Diagnosis present

## 2017-08-26 LAB — BASIC METABOLIC PANEL
Anion gap: 12 (ref 5–15)
BUN: 108 mg/dL — AB (ref 6–20)
CALCIUM: 9 mg/dL (ref 8.9–10.3)
CO2: 25 mmol/L (ref 22–32)
Chloride: 106 mmol/L (ref 101–111)
Creatinine, Ser: 2.95 mg/dL — ABNORMAL HIGH (ref 0.61–1.24)
GFR calc Af Amer: 21 mL/min — ABNORMAL LOW (ref >60)
GFR calc non Af Amer: 18 mL/min — ABNORMAL LOW (ref >60)
Glucose, Bld: 166 mg/dL — ABNORMAL HIGH (ref 65–99)
Potassium: 4.7 mmol/L (ref 3.5–5.1)
SODIUM: 143 mmol/L (ref 135–145)

## 2017-08-26 LAB — GLUCOSE, CAPILLARY
Glucose-Capillary: 186 mg/dL — ABNORMAL HIGH (ref 65–99)
Glucose-Capillary: 215 mg/dL — ABNORMAL HIGH (ref 65–99)
Glucose-Capillary: 275 mg/dL — ABNORMAL HIGH (ref 65–99)

## 2017-08-26 LAB — HEPARIN LEVEL (UNFRACTIONATED): Heparin Unfractionated: <0.1 IU/mL — ABNORMAL LOW (ref 0.30–0.70)

## 2017-08-26 LAB — PROTIME-INR
INR: 2.01
PROTHROMBIN TIME: 22.6 s — AB (ref 11.4–15.2)

## 2017-08-26 LAB — MAGNESIUM: Magnesium: 2.3 mg/dL (ref 1.7–2.4)

## 2017-08-26 MED ORDER — INSULIN ASPART 100 UNIT/ML ~~LOC~~ SOLN
0.0000 [IU] | Freq: Every day | SUBCUTANEOUS | Status: DC
  Administered 2017-08-28: 2 [IU] via SUBCUTANEOUS

## 2017-08-26 MED ORDER — ARTIFICIAL TEARS OPHTHALMIC OINT
TOPICAL_OINTMENT | OPHTHALMIC | Status: DC | PRN
  Administered 2017-08-27: 21:00:00 via OPHTHALMIC
  Filled 2017-08-26: qty 3.5

## 2017-08-26 MED ORDER — ALBUTEROL SULFATE (2.5 MG/3ML) 0.083% IN NEBU: 2.5000 mg | INHALATION_SOLUTION | RESPIRATORY_TRACT | Status: DC | PRN

## 2017-08-26 MED ORDER — ADULT MULTIVITAMIN W/MINERALS CH
1.0000 | ORAL_TABLET | Freq: Every day | ORAL | Status: DC
  Administered 2017-08-27 – 2017-09-01 (×6): 1 via ORAL
  Filled 2017-08-26 (×7): qty 1

## 2017-08-26 MED ORDER — ISOSORBIDE DINITRATE 10 MG PO TABS: 10.0000 mg | ORAL_TABLET | Freq: Three times a day (TID) | ORAL | 0 refills | Status: AC

## 2017-08-26 MED ORDER — INSULIN ASPART 100 UNIT/ML ~~LOC~~ SOLN
0.0000 [IU] | Freq: Three times a day (TID) | SUBCUTANEOUS | Status: DC
  Administered 2017-08-27 – 2017-08-29 (×7): 2 [IU] via SUBCUTANEOUS
  Administered 2017-08-29: 1 [IU] via SUBCUTANEOUS
  Administered 2017-08-29: 3 [IU] via SUBCUTANEOUS
  Administered 2017-08-30: 1 [IU] via SUBCUTANEOUS
  Administered 2017-08-30 – 2017-08-31 (×3): 2 [IU] via SUBCUTANEOUS
  Administered 2017-08-31 (×2): 1 [IU] via SUBCUTANEOUS
  Administered 2017-09-01: 3 [IU] via SUBCUTANEOUS
  Administered 2017-09-01 – 2017-09-02 (×3): 2 [IU] via SUBCUTANEOUS

## 2017-08-26 MED ORDER — FUROSEMIDE 40 MG PO TABS: 40.0000 mg | ORAL_TABLET | Freq: Every day | ORAL | 0 refills | Status: AC | PRN

## 2017-08-26 MED ORDER — WARFARIN SODIUM 5 MG PO TABS: 5.0000 mg | ORAL_TABLET | Freq: Every day | ORAL | 11 refills | Status: AC

## 2017-08-26 MED ORDER — WARFARIN SODIUM 7.5 MG PO TABS: 7.5000 mg | ORAL_TABLET | Freq: Once | ORAL | Status: DC

## 2017-08-26 MED ORDER — RESOURCE THICKENUP CLEAR PO POWD
ORAL | Status: DC | PRN
  Administered 2017-08-29: 03:00:00 via ORAL
  Filled 2017-08-26: qty 125

## 2017-08-26 MED ORDER — HEPARIN (PORCINE) IN NACL 100-0.45 UNIT/ML-% IJ SOLN: 1250.0000 [IU]/h | INTRAMUSCULAR | Status: DC

## 2017-08-26 MED ORDER — HYDRALAZINE HCL 25 MG PO TABS: 25.0000 mg | ORAL_TABLET | Freq: Three times a day (TID) | ORAL | 0 refills | Status: AC

## 2017-08-26 MED ORDER — DIPHENHYDRAMINE HCL 12.5 MG/5ML PO ELIX: 12.5000 mg | ORAL_SOLUTION | Freq: Four times a day (QID) | ORAL | Status: DC | PRN

## 2017-08-26 MED ORDER — ACETAMINOPHEN 325 MG PO TABS: 325.0000 mg | ORAL_TABLET | ORAL | Status: DC | PRN

## 2017-08-26 MED ORDER — WARFARIN SODIUM 7.5 MG PO TABS
7.5000 mg | ORAL_TABLET | Freq: Once | ORAL | Status: DC
  Administered 2017-08-26: 7.5 mg via ORAL
  Filled 2017-08-26: qty 1

## 2017-08-26 MED ORDER — ORAL CARE MOUTH RINSE
15.0000 mL | Freq: Two times a day (BID) | OROMUCOSAL | Status: DC
  Administered 2017-08-27 – 2017-08-28 (×2): 15 mL via OROMUCOSAL

## 2017-08-26 MED ORDER — PROCHLORPERAZINE EDISYLATE 5 MG/ML IJ SOLN: 5.0000 mg | Freq: Four times a day (QID) | INTRAMUSCULAR | Status: DC | PRN

## 2017-08-26 MED ORDER — INSULIN GLARGINE 100 UNIT/ML ~~LOC~~ SOLN: 10.0000 [IU] | Freq: Two times a day (BID) | SUBCUTANEOUS | 0 refills | Status: AC

## 2017-08-26 MED ORDER — SENNOSIDES-DOCUSATE SODIUM 8.6-50 MG PO TABS
2.0000 | ORAL_TABLET | Freq: Every evening | ORAL | Status: DC | PRN
  Filled 2017-08-26: qty 2

## 2017-08-26 MED ORDER — BISACODYL 10 MG RE SUPP: 10.0000 mg | Freq: Every day | RECTAL | Status: DC | PRN

## 2017-08-26 MED ORDER — HYDROCERIN EX CREA
TOPICAL_CREAM | Freq: Two times a day (BID) | CUTANEOUS | Status: DC
  Administered 2017-08-26 – 2017-09-02 (×14): via TOPICAL
  Filled 2017-08-26: qty 113

## 2017-08-26 MED ORDER — PANTOPRAZOLE SODIUM 40 MG PO TBEC
40.0000 mg | DELAYED_RELEASE_TABLET | Freq: Every day | ORAL | Status: DC
  Administered 2017-08-26 – 2017-08-27 (×2): 40 mg via ORAL
  Filled 2017-08-26 (×4): qty 1

## 2017-08-26 MED ORDER — ASPIRIN 81 MG PO CHEW
81.0000 mg | CHEWABLE_TABLET | Freq: Every day | ORAL | Status: DC
  Administered 2017-08-27 – 2017-09-01 (×6): 81 mg via ORAL
  Filled 2017-08-26 (×7): qty 1

## 2017-08-26 MED ORDER — CARVEDILOL 25 MG PO TABS
25.0000 mg | ORAL_TABLET | Freq: Two times a day (BID) | ORAL | Status: DC
  Administered 2017-08-26 – 2017-09-01 (×12): 25 mg via ORAL
  Filled 2017-08-26 (×14): qty 1

## 2017-08-26 MED ORDER — ALUM & MAG HYDROXIDE-SIMETH 200-200-20 MG/5ML PO SUSP: 30.0000 mL | ORAL | Status: DC | PRN

## 2017-08-26 MED ORDER — GUAIFENESIN-DM 100-10 MG/5ML PO SYRP: 5.0000 mL | ORAL_SOLUTION | Freq: Four times a day (QID) | ORAL | Status: DC | PRN

## 2017-08-26 MED ORDER — PRAVASTATIN SODIUM 40 MG PO TABS
80.0000 mg | ORAL_TABLET | Freq: Every day | ORAL | Status: DC
  Administered 2017-08-27 – 2017-09-01 (×5): 80 mg via ORAL
  Filled 2017-08-26 (×8): qty 2

## 2017-08-26 MED ORDER — GERHARDT'S BUTT CREAM
1.0000 "application " | TOPICAL_CREAM | CUTANEOUS | Status: DC | PRN
  Administered 2017-08-27 – 2017-08-30 (×6): 1 via TOPICAL
  Filled 2017-08-26 (×2): qty 1

## 2017-08-26 MED ORDER — TRAZODONE HCL 50 MG PO TABS
25.0000 mg | ORAL_TABLET | Freq: Every evening | ORAL | Status: DC | PRN
  Administered 2017-08-29: 50 mg via ORAL
  Filled 2017-08-26 (×2): qty 1

## 2017-08-26 MED ORDER — INSULIN ASPART 100 UNIT/ML ~~LOC~~ SOLN: 0.0000 [IU] | Freq: Three times a day (TID) | SUBCUTANEOUS | 11 refills | Status: AC

## 2017-08-26 MED ORDER — ISOSORBIDE DINITRATE 10 MG PO TABS
10.0000 mg | ORAL_TABLET | Freq: Three times a day (TID) | ORAL | Status: DC
  Administered 2017-08-26 – 2017-09-01 (×19): 10 mg via ORAL
  Filled 2017-08-26 (×23): qty 1

## 2017-08-26 MED ORDER — WARFARIN - PHARMACIST DOSING INPATIENT
Freq: Every day | Status: DC
  Administered 2017-08-27: 19:00:00

## 2017-08-26 MED ORDER — PROCHLORPERAZINE 25 MG RE SUPP: 12.5000 mg | Freq: Four times a day (QID) | RECTAL | Status: DC | PRN

## 2017-08-26 MED ORDER — INSULIN GLARGINE 100 UNIT/ML ~~LOC~~ SOLN
10.0000 [IU] | Freq: Two times a day (BID) | SUBCUTANEOUS | Status: DC
  Administered 2017-08-26 – 2017-09-02 (×14): 10 [IU] via SUBCUTANEOUS
  Filled 2017-08-26 (×16): qty 0.1

## 2017-08-26 MED ORDER — HYDRALAZINE HCL 25 MG PO TABS
25.0000 mg | ORAL_TABLET | Freq: Three times a day (TID) | ORAL | Status: DC
  Administered 2017-08-26 – 2017-09-02 (×19): 25 mg via ORAL
  Filled 2017-08-26 (×21): qty 1

## 2017-08-26 MED ORDER — PROCHLORPERAZINE MALEATE 5 MG PO TABS: 5.0000 mg | ORAL_TABLET | Freq: Four times a day (QID) | ORAL | Status: DC | PRN

## 2017-08-26 MED ORDER — FLEET ENEMA 7-19 GM/118ML RE ENEM: 1.0000 | ENEMA | Freq: Once | RECTAL | Status: DC | PRN

## 2017-08-26 NOTE — Progress Notes (Signed)
Pt with 6 beats of V tach. Pt asymptomatic and resting comfortably. Last Magnesium level was 2.4 on 08/21/17. MD paged. Orders received for AM magnesium level in addition to pt's scheduled BMET. Will continue to monitor.

## 2017-08-26 NOTE — Progress Notes (Signed)
ANTICOAGULATION CONSULT NOTE   Pharmacy Consult for Heparin, coumadin  Indication: DVT  No Known Allergies  Patient Measurements: Height: 5\' 10"  (177.8 cm) Weight: 212 lb 4.9 oz (96.3 kg) IBW/kg (Calculated) : 73  HDWt: 92.1 kg  Vital Signs: Temp: 98.2 F (36.8 C) (01/22 0550) Temp Source: Oral (01/22 0550) BP: 142/59 (01/22 0550) Pulse Rate: 63 (01/22 0550)  Labs: Recent Labs    08/23/17 0833  08/24/17 1132 08/25/17 0408 08/25/17 0658 08/29/2017 0447  HGB 14.3  --  12.8* 13.4  --   --   HCT 44.8  --  40.3 42.0  --   --   PLT 267  --  253 271  --   --   LABPROT 20.3*  --  21.5*  --  21.0* 22.6*  INR 1.75  --  1.88  --  1.83 2.01  HEPARINUNFRC  --    < > 0.47  --  0.59 <0.10*  CREATININE  --   --  3.43* 3.30*  --  2.95*   < > = values in this interval not displayed.    Estimated Creatinine Clearance: 22.1 mL/min (A) (by C-G formula based on SCr of 2.95 mg/dL (H)).   Assessment: 84 year old male s/p prolonged CPR with CHF, respiratory failure, AKI continuing on heparin and coumadin for new DVT, completed 5 days of overlap.    INR = 2.01  Goal of Therapy:  Heparin level 0.3-0.7 units/ml Monitor platelets by anticoagulation protocol: Yes   Plan:  -Heparin to 1250 units / hr - stop today at noon with therapeutic INR -Repeat Coumadin 7.5mg  PO x 1 tonight -Daily heparin level/INR/CBC -Monitor for s/sx bleeding  Thank you Anette Guarneri, PharmD [phone] 08/21/2017 8:13 AM

## 2017-08-26 NOTE — Progress Notes (Signed)
SLP Cancellation Note  Patient Details Name: Malik Rojas MRN: [id number] DOB: Nov 20, 1931   Cancelled treatment:       Reason Eval/Treat Not Completed: Patient at procedure or test/unavailable - currently working with Physical Therapy. Per RN, pt tolerating Dys 2 diet, but still not eating much.  Will continue efforts to assess diet tolerance.  Neeko Pharo B. Quentin Ore [facility], CCC-SLP Speech Language Pathologist [phone]  Shonna Chock 08/30/2017, 10:39 AM

## 2017-08-26 NOTE — Progress Notes (Signed)
Patient received from [place] alert and oriented x 2-3. O2 at 2L/min via [place] on arrival. Oriented patient and daughter to room and call bell system. Patient and daughter verbalized understanding of admission process. Continue with plan of care.  Mliss Sax

## 2017-08-26 NOTE — PMR Pre-admission (Signed)
PMR Admission Coordinator Pre-Admission Assessment  Patient: Malik Rojas is an 84 y.o., male MRN: [id number] DOB: 02-13-1932 Height: 5' 10"  (177.8 cm) Weight: 96.3 kg (212 lb 4.9 oz)              Insurance Information HMO: yes    PPO:      PCP:      IPA:      80/20:      OTHER: medicare advantage plan PRIMARY: Malik Rojas      Policy#: [id number]      Subscriber: pt CM Name: Malik Rojas      Phone#: [phone]     Fax#: [phone] Pre-Cert#: [id number] for 7 days with f/u Malik Rojas phone [phone] fax [phone]      Employer: retired truck Medical illustrator:  Phone #: [phone]     Name: 08/25/2017 Eff. Date: 08/05/2017     Deduct: none      Out of Pocket Max: $4400      Life Max: none CIR: $345 co pay per day days 1 to 5      SNF: no co pay days 1 to 10; $160 co pay per day days 21-48; no co pay days 49 10 100 Outpatient: $40 coap per visit     Co-Pay: visits per medical neccesity Home Health: 100%      Co-Pay: visits per medical neccesity DME: 80%     Co-Pay: 20% Providers: in network  SECONDARY: none      Medicaid Application Date:       Case Manager:  Disability Application Date:       Case Worker:   Emergency Facilities manager Information    Name Relation Home Work Mobile   [place] Spouse [phone]  [phone]   Alfonse Spruce Daughter   [phone]     Current Medical History  Patient Admitting Diagnosis: anoxic brain injury  History of Present Illness:  HPI:  Malik Rojas a 84 y.o.malewith history of HTN, DMT2, chronic combined CHF, NICM s/p PPM, recent URI symptoms; who was admitted on 08/11/2017 after cardiac arrest. EMS noted faint pulse with VF as initial rhythm. He required 37 Minutes CPR with ACLS protocol and pulse regained on arrival to ED. He was responsive and intubated at admission. Not felt to be a candidate for cardiac cath and treated with cooling. 2D echo showed diffuse hypokinesis, severe  calcification on MV with mild MVR and severe LVH and EF 25-30 %. CT head showed global atrophy, no hemorrhage and poor definition of anterolateral thalamus and lenticular nucleus question due to changes of hypoxia. Blood cultures X 2 negative. He was treated with diuresis due to fluid overload and tolerated extubation without difficulty on 01/14.   He was found to have DVT Right CFV to distal iliac vein and started on IV heparin bridge to coumadin. Cardiac arrest felt to be due to respiratory event leading cardiopulmonary arrest and question plans for ICD once stable.  He was placed on dysphagia 2 diet with nectar liquids due to dysphagia. CHF compensated and AKI resolving. Repeat cardiac echo 1/20 revealed EF improved to 40-45% and no plans for ischemic work up.  Has mild leucocytosis without evidence of infection. Confusion and lethargy is resolving but patient noted to be debilitated with evidence of anoxic encephalopathy.     Past Medical History  Past Medical History:  Diagnosis Date  . Basal cell carcinoma    "scalp, arms, right face;  some cut, some burndt off"  .  CAD (coronary artery disease)    LHC 3/14: Distal left main 10%, LAD 20%, circumflex 10-20%, proximal RCA 20%, distal 40-50%, EF 50-55%.  . Chronic combined systolic and diastolic CHF, NYHA class 4 ([place]) 11/06/2016   Echo 4/18: severe LVH, EF 45-50, global HK, Gr 1 DD, mild AS (mean 9/peak 17), MAC, mod LAE, trivial eff  . Complete heart block (HCC)    Status post Medtronic pacemaker  . High cholesterol   . HTN (hypertension)   . Hx of cardiovascular stress test    a. [place] MV 3/14:  Low risk, EF 36%, apical inf scar, no ischemia, inf-apical HK>> catheterization however demonstrated normal left ventricular function  . Melanoma ([place])    back  . Obesity   . Pneumonia 1940  . Presence of permanent cardiac pacemaker    Medtronic  . Type II diabetes mellitus ([place])   . Urinary frequency   . Urinary incontinence      Family History  family history includes Leukemia in his mother.  Prior Rehab/Hospitalizations:  Has the patient had major surgery during 100 days prior to admission? No  Current Medications   Current Facility-Administered Medications:  .  albuterol (PROVENTIL) (2.5 MG/3ML) 0.083% nebulizer solution 2.5 mg, 2.5 mg, Nebulization, Q2H PRN, Desai, Rahul P, PA-C .  artificial tears (LACRILUBE) ophthalmic ointment, , Both Eyes, Q3H PRN, Rush Farmer, MD .  aspirin chewable tablet 81 mg, 81 mg, Per Tube, Daily, Desai, Rahul P, PA-C, 81 mg at 08/11/2017 0835 .  carvedilol (COREG) tablet 25 mg, 25 mg, Per Tube, BID, Renee Pain, MD, 25 mg at 08/22/2017 0834 .  fentaNYL (SUBLIMAZE) injection 25 mcg, 25 mcg, Intravenous, Q2H PRN, Whiteheart, Kathryn A, NP, 25 mcg at 08/22/17 2045 .  heparin ADULT infusion 100 units/mL (25000 units/282m sodium chloride 0.45%), 1,250 Units/hr, Intravenous, Continuous, Arrien, MJimmy Picket MD, Last Rate: 12.5 mL/hr at 08/24/2017 0947, 1,250 Units/hr at 09/02/2017 0947 .  hydrALAZINE (APRESOLINE) injection 5-10 mg, 5-10 mg, Intravenous, Q6H PRN, Mannam, Praveen, MD, 10 mg at 08/18/17 1600 .  hydrALAZINE (APRESOLINE) tablet 25 mg, 25 mg, Oral, Q8H, HLeonie Man MD, 25 mg at 08/25/2017 0618 .  Influenza vac split quadrivalent PF (FLUZONE HIGH-DOSE) injection 0.5 mL, 0.5 mL, Intramuscular, Prior to discharge, Mannam, Praveen, MD .  insulin aspart (novoLOG) injection 0-9 Units, 0-9 Units, Subcutaneous, TID WC, Arrien, MJimmy Picket MD, 3 Units at 08/11/2017 1146 .  insulin glargine (LANTUS) injection 10 Units, 10 Units, Subcutaneous, BID, Whiteheart, KCristal Ford NP, 10 Units at 08/11/2017 0835 .  isosorbide dinitrate (ISORDIL) tablet 10 mg, 10 mg, Oral, TID, MLarey Dresser MD, 10 mg at 09/04/2017 0834 .  MEDLINE mouth rinse, 15 mL, Mouth Rinse, q12n4p, Ramaswamy, Murali, MD, 15 mL at 08/06/2017 1145 .  multivitamin with minerals tablet 1 tablet, 1 tablet, Oral, Daily,  Arrien, MJimmy Picket MD, 1 tablet at 08/16/2017 [phone].  pantoprazole (PROTONIX) EC tablet 40 mg, 40 mg, Oral, QHS, Ramaswamy, Murali, MD, 40 mg at 08/25/17 2216 .  pneumococcal 23 valent vaccine (PNU-IMMUNE) injection 0.5 mL, 0.5 mL, Intramuscular, Prior to discharge, Mannam, Praveen, MD .  pravastatin (PRAVACHOL) tablet 80 mg, 80 mg, Per Tube, q1800, Desai, Rahul P, PA-C, 80 mg at 08/25/17 1707 .  RESOURCE THICKENUP CLEAR, , Oral, PRN, Ramaswamy, Murali, MD .  warfarin (COUMADIN) tablet 7.5 mg, 7.5 mg, Oral, ONCE-1800, Arrien, MJimmy Picket MD .  Warfarin - Pharmacist Dosing Inpatient, , Does not apply, q1800, MKris Mouton RPH, 1 each  at 08/24/17 1738  Patients Current Diet: DIET DYS 2 Room service appropriate? Yes; Fluid consistency: Nectar Thick  Precautions / Restrictions Precautions Precautions: Fall Restrictions Weight Bearing Restrictions: No   Has the patient had 2 or more falls or a fall with injury in the past year?No  Prior Activity Level Limited Community (1-2x/wk): used cane in the community only, wife typically drove  Development worker, international aid / [place] Devices/Equipment: Kasandra Knudsen (specify quad or straight)  Prior Device Use: Indicate devices/aids used by the patient prior to current illness, exacerbation or injury? None of the above  Prior Functional Level Prior Function Level of Independence: Independent Comments: has cane, used it past few days before admission due to [place]: Did the patient need help bathing, dressing, using the toilet or eating?  Independent wife assisted with Ted hose and shoes due to body habitus  Indoor Mobility: Did the patient need assistance with walking from room to room (with or without device)? Independent  Stairs: Did the patient need assistance with internal or external stairs (with or without device)? Independent  Functional Cognition: Did the patient need help planning regular tasks such as shopping or  remembering to take medications? Independent  Current Functional Level Cognition  Overall Cognitive Status: Impaired/Different from baseline Orientation Level: Oriented to person, Oriented to place, Oriented to situation Following Commands: Follows one step commands with increased time Safety/Judgement: Decreased awareness of deficits General Comments: increased time to intiate and complete tasks when instructed    Extremity Assessment (includes Sensation/Coordination)  Upper Extremity Assessment: Generalized weakness  Lower Extremity Assessment: Defer to PT evaluation    ADLs  Overall ADL's : Needs assistance/impaired Grooming: Wash/dry hands, Wash/dry face, Min guard, Sitting Upper Body Dressing : Minimal assistance, Sitting Toilet Transfer: Moderate assistance, RW, Stand-pivot Toilet Transfer Details (indicate cue type and reason): simulated to recliner SPT Toileting- Clothing Manipulation and Hygiene: Total assistance, Sit to/from stand General ADL Comments: pt required increased time to initiate sit - stand and required multimodal cues for sequencing and direction for UB ADLs, grooming and to transfer to recliner    Mobility  Overal bed mobility: Needs Assistance Bed Mobility: Supine to Sit Rolling: Mod assist, Max assist Sidelying to sit: Mod assist, HOB elevated Supine to sit: Min assist, HOB elevated Sit to supine: Mod assist General bed mobility comments: Pt in recliner on arrival this pm.      Transfers  Overall transfer level: Needs assistance Equipment used: Rolling walker (2 wheeled) Transfer via Lift Equipment: Stedy Transfers: Sit to/from Stand Sit to Stand: Mod assist Squat pivot transfers: Max assist, +2 physical assistance General transfer comment: Cues for hand placement to and from seated surface.  Pt slow to follow commands and at time unable to recall cueing from therapist.  Pt performed 2 min standing trial with intermittent flexion of B knees and trunk.   Pt tolerated standing trials for increased length of time but remains unable to progress to gait due to slow buckling in standing.    Ambulation / Gait / Stairs / Wheelchair Mobility  Ambulation/Gait Ambulation/Gait assistance: (Pre gait weight shifting performed at chair.  Pt took one step forward and backwards with mod assist +1.  ) General Gait Details: unable this date    Posture / Balance Dynamic Sitting Balance Sitting balance - Comments: Pt performed sitting and reaching tasks across midline, lateral lean to the R noted in sitting.  Pt woth forward flexed posture.   Balance Overall balance assessment: Needs assistance  Sitting-balance support: Feet supported, Bilateral upper extremity supported Sitting balance-Leahy Scale: Poor Sitting balance - Comments: Pt performed sitting and reaching tasks across midline, lateral lean to the R noted in sitting.  Pt woth forward flexed posture.   Postural control: Posterior lean Standing balance support: Bilateral upper extremity supported, During functional activity Standing balance-Leahy Scale: Poor Standing balance comment: used Stedy bed - recliner    Special needs/care consideration BiPAP/CPAP  N/a CPM  N/a Continuous Drip IV heparin IV bridge to coumadin Dialysis  N/a Life Vest  N/a Oxygen 2 liters [place]; not on home O2 pta Special Bed  N/a Trach Size  N/a Wound Vac n/a Skin some abrasions to top of head; moisture associated skin damage to coccyx Bowel mgmt: LBM 08/22/16. incontinent Bladder mgmt:incontinent; wore depends pta due to dribbling per wife Diabetic mgmt yes pta   Previous Home Environment Living Arrangements: Spouse/significant other  Lives With: Spouse Available Help at Discharge: Family, Available 24 hours/day Type of Home: House Home Layout: One level Home Access: Stairs to enter [employer] of Steps: 2 Bathroom Shower/Tub: Public librarian, Architectural technologist: Programmer, systems:  Yes How Accessible: Accessible via walker [place]: No Additional Comments: Spouse is able to assist, has been helping at times with ADL's  Discharge Living Setting Plans for Discharge Living Setting: Patient's home, Lives with (comment)(wife) Type of Home at Discharge: House Discharge Home Layout: One level Discharge Home Access: Stairs to enter Entrance Stairs-Rails: None Entrance Stairs-Number of Steps: 2 Discharge Bathroom Shower/Tub: Tub/shower unit, Curtain Discharge Bathroom Toilet: Standard Discharge Bathroom Accessibility: Yes How Accessible: Accessible via walker Does the patient have any problems obtaining your medications?: No  Social/Family/Support Systems Patient Roles: Spouse, Parent Contact Information: Jolene, wife Anticipated Caregiver: wife Anticipated Ambulance person Information: see above Ability/Limitations of Caregiver: min assist level Caregiver Availability: 24/7 Discharge Plan Discussed with Primary Caregiver: Yes Is Caregiver In Agreement with Plan?: Yes Does Caregiver/Family have Issues with Lodging/Transportation while Pt is in Rehab?: No  Goals/Additional Needs Patient/Family Goal for Rehab: supervision to min assist with PT, OT, and SLP Expected length of stay: [facility] 22-28 days Pt/Family Agrees to Admission and willing to participate: Yes Program Orientation Provided & Reviewed with Pt/Caregiver Including Roles  & Responsibilities: Yes  Decrease burden of Care through IP rehab admission: n/a  Possible need for SNF placement upon discharge: not anticipated; wife has refused to date.  Patient Condition: This patient's medical and functional status has changed since the consult dated: 08/22/2017 in which the Rehabilitation Physician determined and documented that the patient's condition is appropriate for intensive rehabilitative care in an inpatient rehabilitation facility. See "History of Present Illness" (above) for medical update.  Functional changes are: overall max to mod assist. Patient's medical and functional status update has been discussed with the Rehabilitation physician and patient remains appropriate for inpatient rehabilitation. Will admit to inpatient rehab today.  Preadmission Screen Completed By:  Cleatrice Burke, 08/12/2017 1:39 PM ______________________________________________________________________   Discussed status with Dr. Naaman Plummer on 08/09/2017 at  1339 and received telephone approval for admission today.  Admission Coordinator:  Cleatrice Burke, time 1829 Date 08/09/2017

## 2017-08-26 NOTE — H&P (Signed)
Physical Medicine and Rehabilitation Admission H&P     Chief Complaint  Patient presents with  . Cardiac Arrest  HPI: Malik Rojas is a 84 y.o. male with history of HTN, DMT2, chronic combined CHF, NICM s/p PPM, recent URI symptoms; who was admitted on 08/11/2017 after cardiac arrest. EMS noted faint pulse with VF as initial rhythm. He required 37 Minutes CPR with ACLS protocol and pulse regained on arrival to ED. He was responsive and intubated at admission. Not felt to be a candidate for cardiac cath and treated with cooling. 2D echo showed diffuse hypokinesis, severe calcification on MV with mild MVR and severe LVH and EF 25-30 %. CT head showed global atrophy, no hemorrhage and poor definition of anterolateral thalamus and lenticular nucleus question due to changes of hypoxia. Blood cultures X 2 negative. He was treated with diuresis due to fluid overload and tolerated extubation without difficulty on 01/14.  He was found to have DVT Right CFV to distal iliac vein and started on IV heparin bridge to coumadin. Cardiac arrest felt to be due to respiratory event leading cardiopulmonary arrest and question plans for ICD once stable. He was placed on dysphagia 2 diet with nectar liquids due to dysphagia. CHF compensated and AKI resolving. Repeat cardiac echo 1/20 revealed EF improved to 40-45% and no plans for ischemic work up. Has mild leucocytosis without evidence of infection. Confusion and lethargy is resolving but patient noted to be debilitated with evidence of anoxic encephalopathy. CIR recommended due to functional deficits.  Review of Systems  Constitutional: Negative for diaphoresis.  HENT: Positive for hearing loss.  Eyes: Negative for blurred vision and double vision.  Respiratory: Negative for cough and shortness of breath.  Cardiovascular: Negative for chest pain and palpitations.  Gastrointestinal: Negative for constipation, heartburn and nausea.  Genitourinary: Positive for  frequency. Negative for dysuria and urgency.  Musculoskeletal: Negative for back pain and myalgias.  Skin: Negative for rash.  Neurological: Negative for dizziness and headaches.  Psychiatric/Behavioral: Positive for memory loss.       Past Medical History:  Diagnosis Date  . Basal cell carcinoma    "scalp, arms, right face; some cut, some burndt off"  . CAD (coronary artery disease)    LHC 3/14: Distal left main 10%, LAD 20%, circumflex 10-20%, proximal RCA 20%, distal 40-50%, EF 50-55%.  . Chronic combined systolic and diastolic CHF, NYHA class 4 ([place]) 11/06/2016   Echo 4/18: severe LVH, EF 45-50, global HK, Gr 1 DD, mild AS (mean 9/peak 17), MAC, mod LAE, trivial eff  . Complete heart block ([facility])    Status post Medtronic pacemaker  . High cholesterol   . HTN (hypertension)   . Hx of cardiovascular stress test    a. [place] MV 3/14: Low risk, EF 36%, apical inf scar, no ischemia, inf-apical HK>> catheterization however demonstrated normal left ventricular function  . Melanoma ([place])    back  . Obesity   . Pneumonia 1940  . Presence of permanent cardiac pacemaker    Medtronic  . Type II diabetes mellitus ([place])   . Urinary frequency   . Urinary incontinence         Past Surgical History:  Procedure Laterality Date  . APPENDECTOMY  40 years ago  . BASAL CELL CARCINOMA EXCISION     "scalp, right face"  . INSERT / REPLACE / REMOVE PACEMAKER  ~ 1999   medtronic EnRhythm  . MOHS SURGERY Left    ear; being monitored-every 6 months  .  PERMANENT PACEMAKER GENERATOR CHANGE N/A 02/12/2013   Procedure: PERMANENT PACEMAKER GENERATOR CHANGE; Surgeon: Deboraha Sprang, MD; Location: [facility] CATH LAB; Service: Cardiovascular; Laterality: N/A;  . TONSILLECTOMY     as child  . TRANSURETHRAL RESECTION OF PROSTATE N/A 10/13/2014   Procedure: WOLF TRANSURETHRAL RESECTION OF THE PROSTATE (TURP); Surgeon: Carolan Clines, MD; Location: [facility] ORS; Service: Urology; Laterality: N/A;        Family History   Problem Relation Age of Onset  . Leukemia Mother    Social History: Married. Retired Administrator. He reports that he quit smoking about 44 years ago. He has a 90.00 pack-year smoking history. he has never used smokeless tobacco. He reports that he drinks alcohol on rare occasions. He reports that he does not use drugs.  Allergies: No Known Allergies        Medications Prior to Admission  Medication Sig Dispense Refill  . carvedilol (COREG) 25 MG tablet Take 1 tablet (25 mg total) 2 (two) times daily by mouth. 180 tablet 3  . isosorbide mononitrate (IMDUR) 60 MG 24 hr tablet Take 1 tablet (60 mg total) by mouth daily. 90 tablet 3  . lisinopril (PRINIVIL,ZESTRIL) 20 MG tablet Take 1 tablet (20 mg total) by mouth 2 (two) times daily. 180 tablet 3  . aspirin 81 MG tablet Take 81 mg by mouth daily.    Marland Kitchen CINNAMON PO Take 1,000 mg by mouth 2 (two) times daily as needed.    . furosemide (LASIX) 40 MG tablet Take 1 tablet (40 mg total) by mouth daily. 90 tablet 3  . glucose blood (ONE TOUCH ULTRA TEST) test strip TEST twice a day DX E11.9 200 each 2  . Insulin Glargine (TOUJEO MAX SOLOSTAR) 300 UNIT/ML SOPN Inject 12 Units into the skin daily. 3 pen 3  . Insulin Pen Needle (BD ULTRA-FINE MICRO PEN NEEDLE) 32G X 6 MM MISC Patient is to use one pen needle per day DX E11.9 100 each 3  . Omega-3 Fatty Acids (FISH OIL) 1000 MG CAPS Take 1,000 mg by mouth daily.     Glory Rosebush DELICA LANCETS 87G MISC TEST TWICE DAILY DX E11.9 200 each 3  . potassium chloride (K-DUR) 10 MEQ tablet Take 10 mEq by mouth daily.    . pravastatin (PRAVACHOL) 80 MG tablet Take 1 tablet (80 mg total) by mouth daily. 90 tablet 1  . psyllium (REGULOID) 0.52 g capsule Take 0.52 g by mouth daily.    . Pumpkin Seed-Soy Germ (AZO BLADDER CONTROL/GO-LESS) CAPS Take 1 capsule by mouth daily.    . silver sulfADIAZINE (SILVADENE) 1 % cream Apply 1 application topically daily as needed (LEG SORE).    . [DISCONTINUED]  sulfamethoxazole-trimethoprim (BACTRIM DS,SEPTRA DS) 800-160 MG tablet Take 1 tablet by mouth 2 (two) times daily. (Patient not taking: Reported on 07/22/2017) 20 tablet 0   Drug Regimen Review  Drug regimen was reviewed and remains appropriate with no significant issues identified  Home:  Home Living  Family/patient expects to be discharged to:: Other (Comment)(SNF vs CIR)  Living Arrangements: Spouse/significant other  Available Help at Discharge: Family, Available 24 hours/day  Type of Home: House  Home Access: Stairs to enter  [employer] of Steps: 2  Home Layout: One level  Additional Comments: Spouse is able to assist, has been helping at times with ADL's  Functional History:  Prior Function  Level of Independence: Independent  Comments: has cane, used it past few days before admission due to SOb  Functional Status:  Mobility:  Bed Mobility  Overal bed mobility: Needs Assistance  Bed Mobility: Supine to Sit  Rolling: Mod assist, Max assist  Sidelying to sit: Mod assist, HOB elevated  Supine to sit: Min assist, HOB elevated  Sit to supine: Mod assist  General bed mobility comments: Pt in recliner on arrival this pm.  Transfers  Overall transfer level: Needs assistance  Equipment used: Rolling walker (2 wheeled)  Transfer via Lift Equipment: Stedy  Transfers: Sit to/from Stand  Sit to Stand: Mod assist  Squat pivot transfers: Max assist, +2 physical assistance  General transfer comment: Cues for hand placement to and from seated surface. Pt slow to follow commands and at time unable to recall cueing from therapist. Pt performed 2 min standing trial with intermittent flexion of B knees and trunk. Pt tolerated standing trials for increased length of time but remains unable to progress to gait due to slow buckling in standing.  Ambulation/Gait  Ambulation/Gait assistance: (Pre gait weight shifting performed at chair. Pt took one step forward and backwards with mod  assist +1. )  General Gait Details: unable this date   ADL:  ADL  Overall ADL's : Needs assistance/impaired  Grooming: Wash/dry hands, Wash/dry face, Min guard, Sitting  Upper Body Dressing : Minimal assistance, Sitting  Toilet Transfer: Moderate assistance, RW, Stand-pivot  Toilet Transfer Details (indicate cue type and reason): simulated to recliner SPT  Toileting- Clothing Manipulation and Hygiene: Total assistance, Sit to/from stand  General ADL Comments: pt required increased time to initiate sit - stand and required multimodal cues for sequencing and direction for UB ADLs, grooming and to transfer to recliner  Cognition:  Cognition  Overall Cognitive Status: Impaired/Different from baseline  Orientation Level: Oriented to person, Oriented to place, Oriented to situation  Cognition  Arousal/Alertness: Awake/alert  Behavior During Therapy: WFL for tasks assessed/performed  Overall Cognitive Status: Impaired/Different from baseline  Area of Impairment: Following commands, Safety/judgement, Problem solving  Following Commands: Follows one step commands with increased time  Safety/Judgement: Decreased awareness of deficits  Problem Solving: Slow processing, Difficulty sequencing, Requires verbal cues, Requires tactile cues  General Comments: increased time to intiate and complete tasks when instructed  Physical Exam:  Blood pressure (!) 142/59, pulse 63, temperature 98.2 F (36.8 C), temperature source Oral, resp. rate 18, height _0  (1.778 m), weight 96.3 kg (212 lb 4.9 oz), SpO2 96 %.  Physical Exam  Nursing note and vitals reviewed.  Constitutional: He is oriented to person, place, and time. He appears well-developed and well-nourished. He is sleeping. He is easily aroused. No distress.  HENT:  Head: Normocephalic and atraumatic.  Mouth/Throat: Oropharynx is clear and moist.  Eyes: Conjunctivae and EOM are normal. Pupils are equal, round, and reactive to light.  Neck: Normal  range of motion. Neck supple.  Cardiovascular: Normal rate and regular rhythm.  Respiratory: Effort normal and breath sounds normal. No stridor. No respiratory distress. He has no wheezes.  GI: Soft. Bowel sounds are normal. He exhibits no distension. There is no tenderness.  Musculoskeletal: He exhibits edema.  Edema RLE. Stasis changes BLE with flaky skin. Scabbed area on left shin with dry bloody drainage on dressing.  Neurological: He is alert, oriented to person, place, and time and easily aroused.  Slower to process (due to fatigue per wife) today with delayed processing. Also limited by hearing loss. He was able to follow simple motor commands.  Skin: Skin is warm and dry. He is not diaphoretic.  Psychiatric: His affect  is blunt. His speech is delayed and slurred. He is slowed. Cognition and memory are impaired. He expresses inappropriate judgment.   Lab Results Last 48 Hours                                                                                                                                                                                                                                                                                                                                                                                                                                                                                                                                                                         Imaging Results (Last 48 hours)     Medical Problem List and Plan:  1. Functional and cognitive deficits secondary  to anoxic brain injury and debility  2. Right femoral vein/iliac veinDVT /Anticoagulation: Pharmaceutical: Coumadin with Heparin bridge  3. Pain Management: Tylenol. Limit neuro sedating medications due to arousal  4. Mood: Team to provide ego support and  motivation as possible. Social worker to screen.  5. Neuropsych: This patient is not fully capable of making decisions on his own behalf.  Normalize sleep-wake patterns  6. Skin/Wound Care: Routine pressure relief measures  7. Fluids/Electrolytes/Nutrition: Monitor I/O with serial lytes. Continues on nectar liquids due to dysphagia.  8. HTN: Monitor BP bid. On coreg, hydralazine and Isordil.  9. Acute on chronic combined CHF: Monitor weights daily. Strict I/O. No ACE/ARB due to CKD. Continue coreg, hydralazine, Isordil and pravastatin.  10. Acute on chronic renal failure: Continue to monitor renal status serially. BUN/SCR peaked at 137/4.4 and now trending down to 108/2.95.  11. T2DM: Monitor BS ac/hs. On lantus 10 units bid with SSI for elevated BS.  12. Leucocytosis: Monitor for signs of infection.  Post Admission Physician Evaluation:  1. Functional deficits secondary to anoxic brain injury and debility. 2. Patient is admitted to receive collaborative, interdisciplinary care between the physiatrist, rehab nursing staff, and therapy team. 3. Patient's level of medical complexity and substantial therapy needs in context of that medical necessity cannot be provided at a lesser intensity of care such as a SNF. 4. Patient has experienced substantial functional loss from his/her baseline which was documented above under the "Functional History" and "Functional Status" headings. Judging by the patient's diagnosis, physical exam, and functional history, the patient has potential for functional progress which will result in measurable gains while on inpatient rehab. These gains will be of substantial and practical use upon discharge in facilitating mobility and self-care at the household level. 5. Physiatrist will provide 24 hour management of medical needs as well as oversight of the therapy plan/treatment and provide guidance as appropriate regarding the interaction of the two. 6. The Preadmission  Screening has been reviewed and patient status is unchanged unless otherwise stated above. 7. 24 hour rehab nursing will assist with bladder management, bowel management, safety, skin/wound care, disease management, medication administration and patient education and help integrate therapy concepts, techniques,education, etc. 8. PT will assess and treat for/with: Lower extremity strength, range of motion, stamina, balance, functional mobility, safety, adaptive techniques and equipment, neuromuscular reeducation, family education. Goals are: Supervision to minimal assistance. 9. OT will assess and treat for/with: ADL's, functional mobility, safety, upper extremity strength, adaptive techniques and equipment, NMR, family education, . Goals are: Supervision to minimal assistance. Therapy may proceed with showering this patient. 10. SLP will assess and treat for/with: Cognition, communication, swallowing, family education. Goals are: Supervision to minimal assistance. 11. Case Management and Social Worker will assess and treat for psychological issues and discharge planning. 12. Team conference will be held weekly to assess progress toward goals and to determine barriers to discharge. 13. Patient will receive at least 3 hours of therapy per day at least 5 days per week. 14. ELOS: 22-28 days  15. Prognosis: good Meredith Staggers, MD, [facility]  08/29/2017  Bary Leriche, PA-C  09/04/2017

## 2017-08-26 NOTE — Progress Notes (Signed)
Meredith Staggers, MD      Meredith Staggers, MD  Physician  Physical Medicine and Rehabilitation      Consult Note  Signed     Date of Service:  08/22/2017 11:46 AM         Related encounter: ED to Hosp-Admission (Current) from 08/11/2017 in [facility]           Signed          Expand All Collapse All            Expand widget buttonCollapse widget button     Hide copied text   Hover for detailscustomization button                                                                                                                  untitled image              Physical Medicine and Rehabilitation Consult        Reason for Consult: Anoxic brain injury.   Referring Physician: Dr. Cathlean Sauer        HPI: Malik Rojas is a 84 y.o. male with history of HTN, DMT2, chronic combined CHF, NICM s/p PPM, recent URI symptoms; who was admitted on 08/11/2017 after cardiac arrest. EMS noted faint pulse with VF as initial rhythm. He required 37  Minutes CPR with ACLS protocol and pulse regained on arrival to ED. He was responsive and intubated at admission. Not felt to be a candidate for cardiac cath and treated with cooling. 2D echo showed diffuse hypokinesis, severe calcification on MV with mild MVR and severe LVH and EF 25-30 %.  CT head showed global atrophy, no hemorrhage and poor definition of anterolateral thalamus and lenticular nucleus question due to changes of hypoxia. He was treated with diuresis due to fluid overload and tolerated extubation without difficulty on 01/14.       He was found to have DVT  Right CFV to distal iliac vein and started on IV heparin.   Cardiac arrest felt to be due to respiratory event leading cardiopulmonary arrest and question plans for ICD once stable.  He  was placed on dysphagia 2 diet with nectar liquids due to dysphagia. CHF compensated and AKI resolving. Confusion and lethargy is resolving but patient noted to be debilitated. CIR recommended due to functional deficits.         Review of Systems   Unable to perform ROS: Mental acuity   Respiratory: Positive for shortness of breath (with activity per wife).    Cardiovascular: Positive for leg swelling. Negative for chest pain and palpitations.   Gastrointestinal: Negative for nausea.   Musculoskeletal: Negative for back pain, joint pain and myalgias.                 Past Medical History:    Diagnosis   Date    .  Basal cell carcinoma            "scalp, arms, right face;  some cut, some burndt off"    .   CAD (coronary artery disease)            LHC 3/14: Distal left main 10%, LAD 20%, circumflex 10-20%, proximal RCA 20%, distal 40-50%, EF 50-55%.    .   Chronic combined systolic and diastolic CHF, NYHA class 4 ([place])   11/06/2016        Echo 4/18: severe LVH, EF 45-50, global HK, Gr 1 DD, mild AS (mean 9/peak 17), MAC, mod LAE, trivial eff    .   Complete heart block (HCC)            Status post Medtronic pacemaker    .   High cholesterol        .   HTN (hypertension)        .   Hx of cardiovascular stress test            a. [place] MV 3/14:  Low risk, EF 36%, apical inf scar, no ischemia, inf-apical HK>> catheterization however demonstrated normal left ventricular function    .   Melanoma ([place])            back    .   Obesity        .   Pneumonia   1940    .   Presence of permanent cardiac pacemaker            Medtronic    .   Type II diabetes mellitus ([place])        .   Urinary frequency        .   Urinary incontinence                    Past Surgical History:    Procedure   Laterality   Date    .   APPENDECTOMY       40 years ago     .   BASAL CELL CARCINOMA EXCISION                "scalp, right face"    .   INSERT / REPLACE / REMOVE PACEMAKER       ~ 1999        medtronic EnRhythm    .   MOHS SURGERY   Left            ear; being monitored-every 6 months    .   PERMANENT PACEMAKER GENERATOR CHANGE   N/A   02/12/2013        Procedure: PERMANENT PACEMAKER GENERATOR CHANGE;  Surgeon: Deboraha Sprang, MD;  Location: [facility] CATH LAB;  Service: Cardiovascular;  Laterality: N/A;    .   TONSILLECTOMY                as child    .   TRANSURETHRAL RESECTION OF PROSTATE   N/A   10/13/2014        Procedure: WOLF TRANSURETHRAL RESECTION OF THE PROSTATE (TURP);  Surgeon: Carolan Clines, MD;  Location: [facility] ORS;  Service: Urology;  Laterality: N/A;                Family History    Problem   Relation   Age of Onset    .   Leukemia   Mother  Social History:  Married. Retired Administrator. He reports that he quit smoking about 44 years ago. He has a 90.00 pack-year smoking history. he has never used smokeless tobacco. He reports that he drinks alcohol on rare occasions. He reports that he does not use drugs.         Allergies: No Known Allergies                Medications Prior to Admission    Medication   Sig   Dispense   Refill    .   carvedilol (COREG) 25 MG tablet   Take 1 tablet (25 mg total) 2 (two) times daily by mouth.   180 tablet   3    .   isosorbide mononitrate (IMDUR) 60 MG 24 hr tablet   Take 1 tablet (60 mg total) by mouth daily.   90 tablet   3    .   lisinopril (PRINIVIL,ZESTRIL) 20 MG tablet   Take 1 tablet (20 mg total) by mouth 2 (two) times daily.   180 tablet   3    .   aspirin 81 MG tablet   Take 81 mg by mouth daily.            Marland Kitchen   CINNAMON PO   Take 1,000 mg by mouth 2 (two) times daily as needed.            .   furosemide (LASIX) 40  MG tablet   Take 1 tablet (40 mg total) by mouth daily.   90 tablet   3    .   glucose blood (ONE TOUCH ULTRA TEST) test strip   TEST twice a day DX E11.9   200 each   2    .   Insulin Glargine (TOUJEO MAX SOLOSTAR) 300 UNIT/ML SOPN   Inject 12 Units into the skin daily.   3 pen   3    .   Insulin Pen Needle (BD ULTRA-FINE MICRO PEN NEEDLE) 32G X 6 MM MISC   Patient is to use one pen needle per day DX E11.9   100 each   3    .   Omega-3 Fatty Acids (FISH OIL) 1000 MG CAPS   Take 1,000 mg by mouth daily.             Glory Rosebush DELICA LANCETS 82M MISC   TEST TWICE DAILY DX E11.9   200 each   3    .   potassium chloride (K-DUR) 10 MEQ tablet   Take 10 mEq by mouth daily.            .   pravastatin (PRAVACHOL) 80 MG tablet   Take 1 tablet (80 mg total) by mouth daily.   90 tablet   1    .   psyllium (REGULOID) 0.52 g capsule   Take 0.52 g by mouth daily.            .   Pumpkin Seed-Soy Germ (AZO BLADDER CONTROL/GO-LESS) CAPS   Take 1 capsule by mouth daily.            .   silver sulfADIAZINE (SILVADENE) 1 % cream   Apply 1 application topically daily as needed (LEG SORE).            .   [DISCONTINUED] sulfamethoxazole-trimethoprim (BACTRIM DS,SEPTRA DS) 800-160 MG tablet   Take 1 tablet by mouth 2 (two) times daily. (Patient not taking: Reported on  07/22/2017)   20 tablet   0          Home:  Home Living  Family/patient expects to be discharged to:: Skilled nursing facility  Living Arrangements: Spouse/significant other  Available Help at Discharge: Family, Available 24 hours/day  Type of Home: House  Home Access: Stairs to enter  [employer] of Steps: 2  Home Layout: One level   Functional History:  Prior Function  Level of Independence: Independent  Comments: has cane, used it past few days before admission due to SOb  Functional Status:    Mobility:  Bed Mobility  Overal bed mobility: Needs Assistance  Bed Mobility: Sit to Supine  Rolling: Mod assist, Max assist  Sidelying to sit: Mod assist, HOB elevated  Sit to supine: Mod assist  General bed mobility comments: Pt required assistance to lower trunk and to lift B LEs into bed against gravity.  Pt required cues for scooting to head of bed.  Bed placed in trendlenberg position to improve ease during scooting.    Transfers  Overall transfer level: Needs assistance  Equipment used: (sara stedy)  Transfer via Lift Equipment: Stedy  Transfers: Sit to/from Stand  Sit to Stand: Max assist(+1 assistance for back to bed transfer)  Squat pivot transfers: Max assist, +2 physical assistance  General transfer comment: Pt remains to require cues for hand placement, patient followed commands to pull into standing and required assistance to boost and extend B hips into standing.  Pt with slight flexed posture in standing.  Stedy plates moved behind patient and he was able to sit for transfer back to bed.  From elevated height of sara stedy plates, patient able to stand with mod assistance +1.    Ambulation/Gait  General Gait Details: unable this date       ADL:       Cognition:  Cognition  Overall Cognitive Status: Impaired/Different from baseline  Orientation Level: Oriented to person, Oriented to place, Disoriented to time, Disoriented to situation  Cognition  Arousal/Alertness: Awake/alert  Behavior During Therapy: WFL for tasks assessed/performed  Overall Cognitive Status: Impaired/Different from baseline  Area of Impairment: Following commands, Safety/judgement, Problem solving  Following Commands: Follows one step commands with increased time  Safety/Judgement: Decreased awareness of deficits  Problem Solving: Slow processing, Difficulty sequencing, Requires verbal cues, Requires tactile cues  General Comments: increased time       Blood pressure (!) 143/56, pulse 66, temperature 97.8 F (36.6 C), temperature source Oral, resp. rate 20, height _0  (1.778 m), weight 95 kg (209 lb 8 oz), SpO2 93 %.  Physical Exam   Nursing note and vitals reviewed.  Constitutional: He appears well-developed and well-nourished.   HENT:   Head: Normocephalic and atraumatic.   Mouth/Throat: Oropharynx is clear and moist.  Scabbed areas left forehead.   Eyes: Conjunctivae and EOM are normal. Pupils are equal, round, and reactive to light.   Neck: Normal range of motion. Neck supple.   Cardiovascular: Normal rate and regular rhythm.   Respiratory: Effort normal. No stridor. He has decreased breath sounds in the left lower field. He exhibits no tenderness.   GI: Soft. Bowel sounds are normal. He exhibits no distension. There is no tenderness.  Musculoskeletal: He exhibits no edema or tenderness.  Stasis changes BLE with flaky skin  Neurological: He is alert.  Oriented to self and place. Tangential and slow to process. He was able to follow simple one step motor commands.  Speech is dysarthric.  Upper  extremity strength 3+ to 4 out of 5 proximal to distal.  Lower extremities are 2 out of 5 hip flexion and knee extension and 3+ to 4 out of 5 ankle dorsiflexion and plantar flexion.  Incisions decrease in stocking glove distribution below the mid calf bilaterally   Skin: Skin is warm and dry.  Psychiatric: His affect is blunt. His speech is delayed and tangential. He is slowed. Cognition and memory are impaired. He expresses inappropriate judgment.         Lab Results Last 24 Hours  Imaging Results (Last 48 hours)          Assessment/Plan:  Diagnosis: Debility and anoxic encephalopathy after cardiac arrest  1.Does the need for close, 24 hr/day medical supervision in concert with the patient's rehab needs make it unreasonable for this patient to be served in a less intensive setting? Yes   2.Co-Morbidities requiring supervision/potential complications: Severe cardiomyopathy, obesity, diabetes type 2, hypertension   3.Due to bladder management, bowel management, safety, skin/wound care, disease management, medication administration, pain management and patient education, does the patient require 24 hr/day rehab nursing? Yes   4.Does the patient require coordinated care of a physician, rehab nurse, PT (1-2 hrs/day, 5 days/week), OT (1-2 hrs/day, 5 days/week) and SLP (1-2 hrs/day, 5 days/week) to address physical and functional deficits in the context of the above medical diagnosis(es)? Yes Addressing deficits in the following areas: balance, endurance, locomotion, strength, transferring, bowel/bladder control, bathing, dressing, feeding, grooming, toileting, cognition, speech, language, swallowing  and psychosocial support   5.Can the patient actively participate in an intensive therapy program of at least 3 hrs of therapy per day at least 5 days per week? Yes   6.The potential for patient to make measurable gains while on inpatient rehab is excellent   7.Anticipated functional outcomes upon discharge from inpatient rehab are supervision and min assist  with PT, supervision and min assist with OT, supervision and min assist with SLP.   8.Estimated rehab length of stay to reach the above functional goals is: 22-28 days   9.Anticipated D/C setting: Home   10.Anticipated post D/C treatments: [facility] therapy   11.Overall Rehab/Functional Prognosis: good      RECOMMENDATIONS:  This patient's condition is appropriate for continued rehabilitative care in the following setting: CIR  Patient has agreed to participate in recommended program. Yes  Note that insurance prior authorization may be required for reimbursement for recommended care.     Comment: Rehab Admissions Coordinator to follow up.     Thanks,     Meredith Staggers, MD, Mellody Drown         Bary Leriche, PA-C  08/22/2017                Revision History                                        Routing History

## 2017-08-26 NOTE — Progress Notes (Signed)
Physical Therapy Treatment Patient Details Name: Malik Rojas MRN: [id number] DOB: 14-Dec-1931 Today's Date: 08/13/2017    History of Present Illness Pt is a 84 yo male admitted 1/7 s/p cardiac arrest at home. downtime was 37 minutes. PMH: baseal cell carcinoma, CAG, CH, HTN, high cholesterol    PT Comments    Pt performed short bout of gait with +2 external assistance.  Pt remains to present with poor cognition and problem solving.  Plan for CIR remains appropriate.  Plan next session to focus on gait training with LE advancement and safety with RW.     Follow Up Recommendations  CIR     Equipment Recommendations  None recommended by PT(TBA at next venue)    Recommendations for Other Services       Precautions / Restrictions Precautions Precautions: Fall Precaution Booklet Issued: No Restrictions Weight Bearing Restrictions: No    Mobility  Bed Mobility Overal bed mobility: Needs Assistance Bed Mobility: Supine to Sit     Supine to sit: Mod assist;HOB elevated     General bed mobility comments: Pt required assistance to elevate trunk into sitting edge of bed.  Several pauses observed and max VCs for technique and to focus on task at hand.    Transfers Overall transfer level: Needs assistance Equipment used: Rolling walker (2 wheeled) Transfers: Sit to/from Stand Sit to Stand: Mod assist;+2 physical assistance         General transfer comment: Pt remains to require cues for hand placement to and from seated surface.  Pt required cues for pushing from seated surface vs. pulling on RW and max VCs to complete task.    Ambulation/Gait Ambulation/Gait assistance: Mod assist;Max assist;+2 physical assistance Ambulation Distance (Feet): 6 Feet Assistive device: Rolling walker (2 wheeled) Gait Pattern/deviations: Step-to pattern;Shuffle;Trunk flexed;Decreased stride length   Gait velocity interpretation: Below normal speed for age/gender General Gait Details: Pt  with poor safety awareness during gait training and required cues to keep body in close proximity to RW.  Pt remains to push RW too far forward despite cues and assistance to keep close to body.  Pt with B decreased stride length and foot clearance.,    Stairs            Wheelchair Mobility    Modified Rankin (Stroke Patients Only)       Balance Overall balance assessment: Needs assistance   Sitting balance-Leahy Scale: Poor       Standing balance-Leahy Scale: Poor Standing balance comment: B UE support.                              Cognition Arousal/Alertness: Awake/alert Behavior During Therapy: WFL for tasks assessed/performed Overall Cognitive Status: Impaired/Different from baseline Area of Impairment: Following commands;Safety/judgement;Problem solving                       Following Commands: Follows one step commands with increased time Safety/Judgement: Decreased awareness of deficits   Problem Solving: Slow processing;Difficulty sequencing;Requires verbal cues;Requires tactile cues General Comments: increased time to intiate and complete tasks when instructed      Exercises General Exercises - Lower Extremity Quad Sets: AROM;Both;10 reps;Supine Long Arc Quad: AROM;Both;10 reps;Seated Hip ABduction/ADduction: AROM;Both;10 reps;Supine Hip Flexion/Marching: AROM;Both;10 reps;Seated    General Comments        Pertinent Vitals/Pain Pain Assessment: No/denies pain    Home Living  Prior Function            PT Goals (current goals can now be found in the care plan section) Acute Rehab PT Goals Patient Stated Goal: none stated Potential to Achieve Goals: Good Progress towards PT goals: Progressing toward goals    Frequency    Min 3X/week      PT Plan Current plan remains appropriate    Co-evaluation              AM-PAC PT "6 Clicks" Daily Activity  Outcome Measure  Difficulty  turning over in bed (including adjusting bedclothes, sheets and blankets)?: Unable Difficulty moving from lying on back to sitting on the side of the bed? : Unable Difficulty sitting down on and standing up from a chair with arms (e.g., wheelchair, bedside commode, etc,.)?: Unable Help needed moving to and from a bed to chair (including a wheelchair)?: A Lot Help needed walking in hospital room?: A Lot Help needed climbing 3-5 steps with a railing? : Total 6 Click Score: 8    End of Session Equipment Utilized During Treatment: Gait belt Activity Tolerance: Patient tolerated treatment well Patient left: with call bell/phone within reach;with nursing/sitter in room;in bed;with bed alarm set Nurse Communication: Mobility status PT Visit Diagnosis: Unsteadiness on feet (R26.81);Muscle weakness (generalized) (M62.81)     Time: 2878-6767 PT Time Calculation (min) (ACUTE ONLY): 25 min  Charges:  $Gait Training: 8-22 mins $Therapeutic Activity: 8-22 mins                    G Codes:       Governor Rooks, PTA pager [phone]    Cristela Blue 08/07/2017, 3:58 PM

## 2017-08-26 NOTE — Progress Notes (Signed)
I have begun insurance approval for an inpt rehab admit today. I have left messages for his wife. [phone]

## 2017-08-26 NOTE — Progress Notes (Signed)
Cristina Gong, RN  Rehab Admission Coordinator  Physical Medicine and Rehabilitation  PMR Pre-admission  Signed  Date of Service:  08/17/2017 1:30 PM       Related encounter: ED to Hosp-Admission (Current) from 08/11/2017 in [facility]      Signed           _0 Hide copied text  _1 Hover for details   PMR Admission Coordinator Pre-Admission Assessment  Patient: Malik Rojas is an 84 y.o., male MRN: [id number] DOB: 1932/05/01 Height: _2  (177.8 cm) Weight: 96.3 kg (212 lb 4.9 oz)                                                                                                                                                  Insurance Information HMO: yes    PPO:      PCP:      IPA:      80/20:      OTHER: medicare advantage plan PRIMARY: [place]      Policy#: [id number]      Subscriber: pt CM Name: Orvan July      Phone#: [phone]     Fax#: [phone] Pre-Cert#: [id number] for 7 days with f/u Vevelyn Royals phone [phone] fax [phone]      Employer: retired truck Medical illustrator:  Phone #: [phone]     Name: 08/25/2017 Eff. Date: 08/05/2017     Deduct: none      Out of Pocket Max: $4400      Life Max: none CIR: $345 co pay per day days 1 to 5      SNF: no co pay days 1 to 10; $160 co pay per day days 21-48; no co pay days 49 10 100 Outpatient: $40 coap per visit     Co-Pay: visits per medical neccesity Home Health: 100%      Co-Pay: visits per medical neccesity DME: 80%     Co-Pay: 20% Providers: in network  SECONDARY: none      Medicaid Application Date:       Case Manager:  Disability Application Date:       Case Worker:   Emergency Tax adviser Information    Name Relation Home Work Mobile   [place] Spouse [phone]  [phone]   Alfonse Spruce Daughter   [phone]     Current Medical History  Patient Admitting Diagnosis: anoxic brain injury  History  of Present Illness:  BPZ:WCHE Eddie Dibbles Caudillis a 84 y.o.malewith history of HTN, DMT2, chronic combined CHF, NICM s/p PPM, recent URI symptoms; who was admitted on 08/11/2017 after cardiac arrest. EMS noted faint pulse with VF as initial rhythm. He required 37 Minutes CPR with ACLS protocol and pulse regained on arrival to ED. He was responsive and intubated at admission.  Not felt to be a candidate for cardiac cath and treated with cooling. 2D echo showed diffuse hypokinesis, severe calcification on MV with mild MVR and severe LVH and EF 25-30 %. CT head showed global atrophy, no hemorrhage and poor definition of anterolateral thalamus and lenticular nucleus question due to changes of hypoxia.Blood cultures X 2 negative.He was treated with diuresis due to fluid overload and tolerated extubation without difficulty on 01/14.   He was found to have DVT Right CFV to distal iliac vein and started on IV heparinbridge to coumadin. Cardiac arrest felt to be due to respiratory event leading cardiopulmonary arrest and question plans for ICD once stable. He was placed on dysphagia 2 diet with nectar liquids due to dysphagia. CHF compensated and AKI resolving.Repeat cardiac echo 1/20 revealed EF improved to 40-45%and no plans for ischemic work up.Has mild leucocytosis without evidence of infection.Confusion and lethargy is resolving but patient noted to be debilitatedwith evidence of anoxic encephalopathy.   Past Medical History      Past Medical History:  Diagnosis Date  . Basal cell carcinoma    "scalp, arms, right face;  some cut, some burndt off"  . CAD (coronary artery disease)    LHC 3/14: Distal left main 10%, LAD 20%, circumflex 10-20%, proximal RCA 20%, distal 40-50%, EF 50-55%.  . Chronic combined systolic and diastolic CHF, NYHA class 4 ([place]) 11/06/2016   Echo 4/18: severe LVH, EF 45-50, global HK, Gr 1 DD, mild AS (mean 9/peak 17), MAC, mod LAE, trivial eff  . Complete heart  block (HCC)    Status post Medtronic pacemaker  . High cholesterol   . HTN (hypertension)   . Hx of cardiovascular stress test    a. [place] MV 3/14:  Low risk, EF 36%, apical inf scar, no ischemia, inf-apical HK>> catheterization however demonstrated normal left ventricular function  . Melanoma ([place])    back  . Obesity   . Pneumonia 1940  . Presence of permanent cardiac pacemaker    Medtronic  . Type II diabetes mellitus ([place])   . Urinary frequency   . Urinary incontinence     Family History  family history includes Leukemia in his mother.  Prior Rehab/Hospitalizations:  Has the patient had major surgery during 100 days prior to admission? No  Current Medications   Current Facility-Administered Medications:  .  albuterol (PROVENTIL) (2.5 MG/3ML) 0.083% nebulizer solution 2.5 mg, 2.5 mg, Nebulization, Q2H PRN, Desai, Rahul P, PA-C .  artificial tears (LACRILUBE) ophthalmic ointment, , Both Eyes, Q3H PRN, Rush Farmer, MD .  aspirin chewable tablet 81 mg, 81 mg, Per Tube, Daily, Desai, Rahul P, PA-C, 81 mg at 08/08/2017 0835 .  carvedilol (COREG) tablet 25 mg, 25 mg, Per Tube, BID, Renee Pain, MD, 25 mg at 09/01/2017 0834 .  fentaNYL (SUBLIMAZE) injection 25 mcg, 25 mcg, Intravenous, Q2H PRN, Whiteheart, Kathryn A, NP, 25 mcg at 08/22/17 2045 .  heparin ADULT infusion 100 units/mL (25000 units/259m sodium chloride 0.45%), 1,250 Units/hr, Intravenous, Continuous, Arrien, MJimmy Picket MD, Last Rate: 12.5 mL/hr at 08/12/2017 0947, 1,250 Units/hr at 08/27/2017 0947 .  hydrALAZINE (APRESOLINE) injection 5-10 mg, 5-10 mg, Intravenous, Q6H PRN, Mannam, Praveen, MD, 10 mg at 08/18/17 1600 .  hydrALAZINE (APRESOLINE) tablet 25 mg, 25 mg, Oral, Q8H, HLeonie Man MD, 25 mg at 08/12/2017 0618 .  Influenza vac split quadrivalent PF (FLUZONE HIGH-DOSE) injection 0.5 mL, 0.5 mL, Intramuscular, Prior to discharge, Mannam, Praveen, MD .  insulin aspart (novoLOG) injection  0-9 Units, 0-9 Units, Subcutaneous, TID WC, Arrien, Jimmy Picket, MD, 3 Units at 09/04/2017 1146 .  insulin glargine (LANTUS) injection 10 Units, 10 Units, Subcutaneous, BID, Whiteheart, Cristal Ford, NP, 10 Units at 08/22/2017 0835 .  isosorbide dinitrate (ISORDIL) tablet 10 mg, 10 mg, Oral, TID, Larey Dresser, MD, 10 mg at 08/15/2017 0834 .  MEDLINE mouth rinse, 15 mL, Mouth Rinse, q12n4p, Ramaswamy, Murali, MD, 15 mL at 08/25/2017 1145 .  multivitamin with minerals tablet 1 tablet, 1 tablet, Oral, Daily, Arrien, Jimmy Picket, MD, 1 tablet at 08/24/2017 [phone] .  pantoprazole (PROTONIX) EC tablet 40 mg, 40 mg, Oral, QHS, Ramaswamy, Murali, MD, 40 mg at 08/25/17 2216 .  pneumococcal 23 valent vaccine (PNU-IMMUNE) injection 0.5 mL, 0.5 mL, Intramuscular, Prior to discharge, Mannam, Praveen, MD .  pravastatin (PRAVACHOL) tablet 80 mg, 80 mg, Per Tube, q1800, Desai, Rahul P, PA-C, 80 mg at 08/25/17 1707 .  RESOURCE THICKENUP CLEAR, , Oral, PRN, Brand Males, MD .  warfarin (COUMADIN) tablet 7.5 mg, 7.5 mg, Oral, ONCE-1800, Arrien, Jimmy Picket, MD .  Warfarin - Pharmacist Dosing Inpatient, , Does not apply, q1800, Kris Mouton, [facility], 1 each at 08/24/17 1738  Patients Current Diet: DIET DYS 2 Room service appropriate? Yes; Fluid consistency: Nectar Thick  Precautions / Restrictions Precautions Precautions: Fall Restrictions Weight Bearing Restrictions: No   Has the patient had 2 or more falls or a fall with injury in the past year?No  Prior Activity Level Limited Community (1-2x/wk): used cane in the community only, wife typically drove  Development worker, international aid / [place] Devices/Equipment: Kasandra Knudsen (specify quad or straight)  Prior Device Use: Indicate devices/aids used by the patient prior to current illness, exacerbation or injury? None of the above  Prior Functional Level Prior Function Level of Independence: Independent Comments: has cane, used it past few  days before admission due to [place]: Did the patient need help bathing, dressing, using the toilet or eating?  Independent wife assisted with Ted hose and shoes due to body habitus  Indoor Mobility: Did the patient need assistance with walking from room to room (with or without device)? Independent  Stairs: Did the patient need assistance with internal or external stairs (with or without device)? Independent  Functional Cognition: Did the patient need help planning regular tasks such as shopping or remembering to take medications? Independent  Current Functional Level Cognition  Overall Cognitive Status: Impaired/Different from baseline Orientation Level: Oriented to person, Oriented to place, Oriented to situation Following Commands: Follows one step commands with increased time Safety/Judgement: Decreased awareness of deficits General Comments: increased time to intiate and complete tasks when instructed    Extremity Assessment (includes Sensation/Coordination)  Upper Extremity Assessment: Generalized weakness  Lower Extremity Assessment: Defer to PT evaluation    ADLs  Overall ADL's : Needs assistance/impaired Grooming: Wash/dry hands, Wash/dry face, Min guard, Sitting Upper Body Dressing : Minimal assistance, Sitting Toilet Transfer: Moderate assistance, RW, Stand-pivot Toilet Transfer Details (indicate cue type and reason): simulated to recliner SPT Toileting- Clothing Manipulation and Hygiene: Total assistance, Sit to/from stand General ADL Comments: pt required increased time to initiate sit - stand and required multimodal cues for sequencing and direction for UB ADLs, grooming and to transfer to recliner    Mobility  Overal bed mobility: Needs Assistance Bed Mobility: Supine to Sit Rolling: Mod assist, Max assist Sidelying to sit: Mod assist, HOB elevated Supine to sit: Min assist, HOB elevated Sit to supine: Mod assist General  bed mobility comments:  Pt in recliner on arrival this pm.      Transfers  Overall transfer level: Needs assistance Equipment used: Rolling walker (2 wheeled) Transfer via Lift Equipment: Stedy Transfers: Sit to/from Stand Sit to Stand: Mod assist Squat pivot transfers: Max assist, +2 physical assistance General transfer comment: Cues for hand placement to and from seated surface.  Pt slow to follow commands and at time unable to recall cueing from therapist.  Pt performed 2 min standing trial with intermittent flexion of B knees and trunk.  Pt tolerated standing trials for increased length of time but remains unable to progress to gait due to slow buckling in standing.    Ambulation / Gait / Stairs / Wheelchair Mobility  Ambulation/Gait Ambulation/Gait assistance: (Pre gait weight shifting performed at chair.  Pt took one step forward and backwards with mod assist +1.  ) General Gait Details: unable this date    Posture / Balance Dynamic Sitting Balance Sitting balance - Comments: Pt performed sitting and reaching tasks across midline, lateral lean to the R noted in sitting.  Pt woth forward flexed posture.   Balance Overall balance assessment: Needs assistance Sitting-balance support: Feet supported, Bilateral upper extremity supported Sitting balance-Leahy Scale: Poor Sitting balance - Comments: Pt performed sitting and reaching tasks across midline, lateral lean to the R noted in sitting.  Pt woth forward flexed posture.   Postural control: Posterior lean Standing balance support: Bilateral upper extremity supported, During functional activity Standing balance-Leahy Scale: Poor Standing balance comment: used Stedy bed - recliner    Special needs/care consideration BiPAP/CPAP  N/a CPM  N/a Continuous Drip IV heparin IV bridge to coumadin Dialysis  N/a Life Vest  N/a Oxygen 2 liters [place]; not on home O2 pta Special Bed  N/a Trach Size  N/a Wound Vac n/a Skin some abrasions to top of head; moisture  associated skin damage to coccyx Bowel mgmt: LBM 08/22/16. incontinent Bladder mgmt:incontinent; wore depends pta due to dribbling per wife Diabetic mgmt yes pta   Previous Home Environment Living Arrangements: Spouse/significant other  Lives With: Spouse Available Help at Discharge: Family, Available 24 hours/day Type of Home: House Home Layout: One level Home Access: Stairs to enter [employer] of Steps: 2 Bathroom Shower/Tub: Public librarian, Architectural technologist: Programmer, systems: Yes How Accessible: Accessible via walker [place]: No Additional Comments: Spouse is able to assist, has been helping at times with ADL's  Discharge Living Setting Plans for Discharge Living Setting: Patient's home, Lives with (comment)(wife) Type of Home at Discharge: House Discharge Home Layout: One level Discharge Home Access: Stairs to enter Entrance Stairs-Rails: None Entrance Stairs-Number of Steps: 2 Discharge Bathroom Shower/Tub: Tub/shower unit, Curtain Discharge Bathroom Toilet: Standard Discharge Bathroom Accessibility: Yes How Accessible: Accessible via walker Does the patient have any problems obtaining your medications?: No  Social/Family/Support Systems Patient Roles: Spouse, Parent Contact Information: Jolene, wife Anticipated Caregiver: wife Anticipated Ambulance person Information: see above Ability/Limitations of Caregiver: min assist level Caregiver Availability: 24/7 Discharge Plan Discussed with Primary Caregiver: Yes Is Caregiver In Agreement with Plan?: Yes Does Caregiver/Family have Issues with Lodging/Transportation while Pt is in Rehab?: No  Goals/Additional Needs Patient/Family Goal for Rehab: supervision to min assist with PT, OT, and SLP Expected length of stay: ELOS 22-28 days Pt/Family Agrees to Admission and willing to participate: Yes Program Orientation Provided & Reviewed with Pt/Caregiver Including Roles  &  Responsibilities: Yes  Decrease burden of Care through IP rehab admission: n/a  Possible need for SNF placement upon discharge: not anticipated; wife has refused to date.  Patient Condition: This patient's medical and functional status has changed since the consult dated: 08/22/2017 in which the Rehabilitation Physician determined and documented that the patient's condition is appropriate for intensive rehabilitative care in an inpatient rehabilitation facility. See "History of Present Illness" (above) for medical update. Functional changes are: overall max to mod assist. Patient's medical and functional status update has been discussed with the Rehabilitation physician and patient remains appropriate for inpatient rehabilitation. Will admit to inpatient rehab today.  Preadmission Screen Completed By:  Cleatrice Burke, 08/12/2017 1:39 PM ______________________________________________________________________   Discussed status with Dr. Naaman Plummer on 08/07/2017 at  1339 and received telephone approval for admission today.  Admission Coordinator:  Cleatrice Burke, time 5615 Date 08/15/2017             Cosigned by: Meredith Staggers, MD at 08/22/2017 2:25 PM  Revision History

## 2017-08-26 NOTE — Progress Notes (Signed)
I have insurance approval and bed available to admit pt to today. I met with pt and his wife at bedside and they are in agreement. I have notified Dr. Cathlean Sauer and will make the arrangements to admit today. I will notify RN CM. [phone]

## 2017-08-26 NOTE — Plan of Care (Signed)
  Education: Ability to manage disease process will improve 08/06/2017 0729 - Progressing by Lavonna Rua, RN    Pt will not likely be able to be totally self sufficient and will be reliant for ADL, meals prep, transportation to MD appts

## 2017-08-26 NOTE — Progress Notes (Signed)
  Speech Language Pathology Treatment: Dysphagia  Patient Details Name: Malik Rojas MRN: [id number] DOB: Mar 23, 1932 Today's Date: 08/08/2017 Time: 9892-1194 SLP Time Calculation (min) (ACUTE ONLY): 15 min  Assessment / Plan / Recommendation Clinical Impression  Pt seen at bedside for assessment of advanced diet, which was upgraded from dys 1 (puree) to dys 2 (finely chopped) yesterday (08/25/17). RN reports pt appears to tolerate advanced solids, but continues to exhibit poor po intake. Pt was observed with dys 2 solids and nectar thick liquids. Wife present. Pt continues to require cueing for oral cavity sweep.  Pt's wife indicated pt may transfer to CIR soon. Recommend ST follow up at next venue for diet advancement, education, and skilled ST intervention per CIR SLP for decreased caregiver burden.    HPI HPI: This is a 84 year old man history of coronary artery disease, DM, HTN, complete heart block, pacemaker presents today status post cardiac arrest.Per chart resuscitation was 37 minutes with paced rhythm followed by return of pulses. Intubated 1/7-1/14. CXR 1/15 Aortic atherosclerosis. Endotracheal and nasogastric tubes have been removed. Stable bibasilar atelectasis or edema is noted with associated pleural effusions. CXR 1/14 CHF with slight interval decrease in interstitial edema. Persistent bibasilar pleural effusions layering posteriorly.      SLP Plan  Continue with current plan of care       Recommendations  Diet recommendations: Dysphagia 2 (fine chop);Nectar-thick liquid Liquids provided via: Straw;Cup Medication Administration: Whole meds with puree Supervision: Patient able to self feed;Full supervision/cueing for compensatory strategies Compensations: Slow rate;Small sips/bites;Lingual sweep for clearance of pocketing;Minimize environmental distractions Postural Changes and/or Swallow Maneuvers: Seated upright 90 degrees;Out of bed for meals                Oral  Care Recommendations: Oral care BID Follow up Recommendations: (wife reports pt is going to CIR) SLP Visit Diagnosis: Dysphagia, oropharyngeal phase (R13.12) Plan: Continue with current plan of care       Mansura B. Quentin Ore [facility], CCC-SLP Speech Language Pathologist [phone]  Shonna Chock 08/18/2017, 1:37 PM

## 2017-08-26 NOTE — Consult Note (Signed)
   [facility] CM Inpatient Consult   08/27/2017  Elise Gladden [facility] Dec 05, 1931 [id number]  Follow up:  Patient is transitioning to [facility] inpatient rehab program.  Natividad Brood, RN BSN [facility] Liaison  [phone] business mobile phone Toll free office [phone]

## 2017-08-26 NOTE — Discharge Summary (Signed)
Physician Discharge Summary  Tracker Mance Aronoff ASN:[id number] DOB: 1931-08-16 DOA: 08/11/2017  PCP: Denita Lung, MD  Admit date: 08/11/2017 Discharge date: 08/20/2017  Admitted From: Home  Disposition:  IR  Recommendations for Outpatient Follow-up:  1. Follow up with PCP in 1-2 weeks 2. Code status was changed to DNR 3. Please follow up INR on 08/28/16, target INR 2 to 3. 4. Furosemide 40 mg use as needed for weight gain 2lbs in 48 hours or 5 lbc in 7 days. 5. Holding lisinopril due to risk of renal failure.   Home Health: Na Equipment/Devices: Na   Discharge Condition: Stable CODE STATUS: DNR  Diet recommendation: Heart healthy and diabetic prudent  Brief/Interim Summary: 84 year old male who was admitted to the intensive care unit1/7/2019after sustaining a cardiac arrest, reported ventricular fibrillation.Apparently patient complained of acute on chronic dyspnea and he asked his wife to call 911,his dyspnea, progressively worsened,to the point where he lost consciousness immediately after EMS arrived.He received CPR for 80 minutes,with recovery of spontaneous circulation.Patient was intubated,placed on mechanical ventilation,and underwent hypothermic protocol.At the time of initial physical examination, patient was sedated paralyzed.His blood pressure was 160/72, heart rate 60, temperature 96.1, respiratory 16, oxygen saturation 99% on invasive mechanical ventilation. He had coarse breath sounds bilaterally, heart S1-S2 present, rhythmic, no gallops, rubs or murmurs, abdomen was soft nontender, positive nonpitting bilateral lower extremity edema.His chest x-ray showed cardiomegaly with bilateral interstitial markings consistent with pulmonary edema.  Patient was treated with supportive medical therapy, followed hypothermia protocol, he recovered is mental status and strength to the point where he was liberated from mechanical ventilaion, extubated1/14.  On further  evaluation his LV function was noted to be decreased 25-30 % with diffuse hypokinesis, he alsodeveloped ATN.   Transferred to the medical unit, where he has remained hemodynamically stable, and his renal function has been recovering.   1. Systolic heart failure exacerbation, acute on chronic, complicated by cardiac arrest, ventricular fibrillation. Patient has been recovering well, he has been placed on Coreg, aspirin, pravastatin, and afterload reducing agents with hydralazine and isosorbide. Holding ACE inhibitor due to risk of worsening kidney injury. Follow-up echocardiography showed improvement of his LV function, ejection fraction 40-45%. Due to comorbidities patient was considered not a good candidate for further ischemia workup. He will follow-up as an outpatient. By the time of discharge patient is hemodynamically stable, will recommend use furosemide as needed depending on volume status and kidney function.  2. Acute hypoxic respiratory failure. Patient initially required invasive mechanical ventilation, he responded well to medical therapy. Successfully liberated from mechanical ventilation January 14, his volume status has improved along with his pulmonary edema. His oxygen saturation at discharge is 96% on 2 L nasal cannula.  3. Acute kidney injury due to acute tubular necrosis. Likely related to hypoperfusion/cardiac arrest, his peak creatinine range 4.43. Patient improved hemodynamically, his urine output has been stable. He has a negative fluid balance since admission, 13,400 mL. His discharge creatinine is 2.95, serum potassium 4.7, serum bicarbonate 25. Will recommend close monitoring of kidney function and electrolytes, avoid nephrotoxic agents or hypotension.   4. Acute deep vein thrombosis, right femoral vein/ right distal iliac vein. Patient underwent ultrasonography of his lower extremities, finding acute deep vein thrombosis on the right common femoral and distal iliac vein. He  was placed on heparin IV and warfarin his discharge INR is 2.0. Will recommend continue warfarin therapy for at least 3 to 6 months, for a presumed provoked deep vein thrombosis.   5.  Type 2 diabetes mellitus. Patient was placed on insulin sliding scale for glucose coverage and monitoring, basal insulin with Levemir 10 units twice a day. His capillary glucose remained well-controlled.  6. Deconditioning. Patient had a prolonged hospital stay, severe deconditioning, patient was seen by physical therapy with recommendations for inpatient rehabilitation evaluation.   Discharge Diagnoses:  Principal Problem:   Cardiac arrest [facility]) Active Problems:   Hypertensive heart disease with CHF (congestive heart failure) ([place])   Diabetes mellitus due to underlying condition with diabetic nephropathy, without long-term current use of insulin (HCC)   Acute on chronic combined systolic and diastolic CHF, NYHA class 3 (HCC)   Acute hypercapnic respiratory failure (HCC)   Uncontrolled hypertension   Acute pulmonary edema (HCC)   Acute renal failure superimposed on stage 3 chronic kidney disease (HCC)   Respiratory failure ([place])   Palliative care by specialist   DNR (do not resuscitate)   Weakness generalized   Pressure injury of skin    Discharge Instructions   Allergies as of 08/06/2017   No Known Allergies     Medication List    STOP taking these medications   AZO BLADDER CONTROL/GO-LESS Caps   CINNAMON PO   Fish Oil 1000 MG Caps   glucose blood test strip Commonly known as:  ONE TOUCH ULTRA TEST   Insulin Glargine 300 UNIT/ML Sopn Commonly known as:  TOUJEO MAX SOLOSTAR Replaced by:  insulin glargine 100 UNIT/ML injection   Insulin Pen Needle 32G X 6 MM Misc Commonly known as:  BD PEN NEEDLE MICRO U/F   isosorbide mononitrate 60 MG 24 hr tablet Commonly known as:  IMDUR   lisinopril 20 MG tablet Commonly known as:  PRINIVIL,ZESTRIL   ONETOUCH DELICA LANCETS 54Y Misc    potassium chloride 10 MEQ tablet Commonly known as:  K-DUR   psyllium 0.52 g capsule Commonly known as:  REGULOID   silver sulfADIAZINE 1 % cream Commonly known as:  SILVADENE     TAKE these medications   aspirin 81 MG tablet Take 81 mg by mouth daily.   carvedilol 25 MG tablet Commonly known as:  COREG Take 1 tablet (25 mg total) 2 (two) times daily by mouth.   furosemide 40 MG tablet Commonly known as:  LASIX Take 1 tablet (40 mg total) by mouth daily as needed for fluid or edema (weight increase 2lbs in 48 hours or 5 lbs in 7 days.). What changed:    when to take this  reasons to take this   hydrALAZINE 25 MG tablet Commonly known as:  APRESOLINE Take 1 tablet (25 mg total) by mouth every 8 (eight) hours.   insulin aspart 100 UNIT/ML injection Commonly known as:  novoLOG Inject 0-9 Units into the skin 3 (three) times daily with meals. Glucose 121-150 use 1 unit of insulin, 151-200 use 2 units, 201-250 use 4 units, 251-300 use 6 units, 301-350 use 8 units, 351 or greater use 10 units.   insulin glargine 100 UNIT/ML injection Commonly known as:  LANTUS Inject 0.1 mLs (10 Units total) into the skin 2 (two) times daily. Replaces:  Insulin Glargine 300 UNIT/ML Sopn   isosorbide dinitrate 10 MG tablet Commonly known as:  ISORDIL Take 1 tablet (10 mg total) by mouth 3 (three) times daily.   pravastatin 80 MG tablet Commonly known as:  PRAVACHOL Take 1 tablet (80 mg total) by mouth daily.   warfarin 5 MG tablet Commonly known as:  COUMADIN Take 1 tablet (5 mg total) by  mouth daily.       No Known Allergies  Consultations: Cardiology   Procedures/Studies: Ct Head Wo Contrast  Result Date: 08/11/2017 CLINICAL DATA:  84 year old male post cardiac arrest with 37 minutes of CPR before return of spontaneous circulation. Initial encounter. EXAM: CT HEAD WITHOUT CONTRAST TECHNIQUE: Contiguous axial images were obtained from the base of the skull through the vertex  without intravenous contrast. COMPARISON:  None. FINDINGS: Brain: No intracranial hemorrhage. Slightly poor definition anterolateral aspect of the thalamus and posterior lenticular nucleus bilaterally may indicate changes of hypoxia. Prominent chronic microvascular changes. Infarct anterior limb left internal capsule of indeterminate age. Global atrophy. No intracranial mass lesion noted on this unenhanced exam. Vascular: Vascular calcifications Skull: No skull fracture. Sinuses/Orbits: Exophthalmos. Opacification left frontal sinus, anterior ethmoid sinus air cells bilaterally and left maxillary sinus. Other: Mastoid air cells and middle ear cavities are clear IMPRESSION: No intracranial hemorrhage. Slightly poor definition anterolateral aspect of the thalamus and posterior lenticular nucleus bilaterally may indicate changes of hypoxia. Prominent chronic microvascular changes. Infarct anterior limb left internal capsule of indeterminate age. Global atrophy. Opacification left frontal sinus, anterior ethmoid sinus air cells bilaterally and left maxillary sinus. Electronically Signed   By: Genia Del M.D.   On: 08/11/2017 16:51   Dg Chest Port 1 View  Result Date: 08/20/2017 CLINICAL DATA:  Respiratory failure. EXAM: PORTABLE CHEST 1 VIEW COMPARISON:  Radiograph of August 19, 2017. FINDINGS: Stable cardiomegaly. Left-sided pacemaker is unchanged in position. No pneumothorax is noted. Mild left basilar subsegmental atelectasis is noted. Stable bibasilar atelectasis or edema is noted with associated pleural effusions, right greater than left. Bony thorax is unremarkable. IMPRESSION: Stable bibasilar atelectasis or edema is noted with associated pleural effusions, right greater than left. Electronically Signed   By: Marijo Conception, M.D.   On: 08/20/2017 09:24   Dg Chest Portable 1 View  Result Date: 08/19/2017 CLINICAL DATA:  Respiratory failure. EXAM: PORTABLE CHEST 1 VIEW COMPARISON:  Radiograph of  August 18, 2017. FINDINGS: Stable cardiomegaly. Atherosclerosis of thoracic aorta is noted. Left-sided pacemaker is unchanged in position. No pneumothorax is noted. Endotracheal and nasogastric tubes have been removed. Stable bibasilar opacities are noted concerning for edema or atelectasis with associated pleural effusions. Bony thorax is unremarkable. IMPRESSION: Aortic atherosclerosis. Endotracheal and nasogastric tubes have been removed. Stable bibasilar atelectasis or edema is noted with associated pleural effusions. Electronically Signed   By: Marijo Conception, M.D.   On: 08/19/2017 07:45   Dg Chest Port 1 View  Result Date: 08/18/2017 CLINICAL DATA:  Intubated patient, cardiac arrest, CHF respiratory failure, diabetes, former smoker. EXAM: PORTABLE CHEST 1 VIEW COMPARISON:  Portable chest x-ray of August 17, 2017 FINDINGS: The lungs are reasonably well inflated. There are bilateral pleural effusions layering posteriorly. The interstitial markings are slightly less conspicuous bilaterally today. The cardiac silhouette remains enlarged. The central pulmonary vascularity is less engorged and more distinct. The ICD is in stable position. There calcification in the wall of the thoracic aorta. The endotracheal tube tip projects approximately 3.4 cm above the carina. The esophagogastric tube tip and proximal port project below the inferior margin of the image. IMPRESSION: CHF with slight interval decrease in interstitial edema. Persistent bibasilar pleural effusions layering posteriorly. Thoracic aortic atherosclerosis. The support tubes are in reasonable position.  Assess Electronically Signed   By: David  Martinique M.D.   On: 08/18/2017 07:36   Dg Chest Port 1 View  Result Date: 08/17/2017 CLINICAL DATA:  ET tube EXAM: PORTABLE  CHEST 1 VIEW COMPARISON:  08/16/2017 FINDINGS: Support devices are stable. Bilateral airspace opacities and layering effusions again noted. Cardiomegaly. No change since prior study.  IMPRESSION: Stable bilateral airspace disease and layering effusions. Findings likely reflect edema/CHF. Electronically Signed   By: Rolm Baptise M.D.   On: 08/17/2017 07:22   Dg Chest Port 1 View  Result Date: 08/16/2017 CLINICAL DATA:  Respiratory failure EXAM: PORTABLE CHEST 1 VIEW COMPARISON:  08/15/2017 FINDINGS: Endotracheal tube terminates 3.4 cm above carina.Nasogastric tube extends beyond the inferior aspect of the film. Dual lead pacer. Midline trachea. Cardiomegaly accentuated by AP portable technique. Small layering bilateral pleural effusions. No pneumothorax. Mild pulmonary venous congestion. Improved aeration, with persistent right greater than left base airspace disease. Atherosclerosis in the transverse aorta. IMPRESSION: Slightly improved aeration, with decreased airspace disease and interstitial edema. Mild pulmonary venous congestion and bilateral pleural effusions remain. Aortic Atherosclerosis (ICD10-I70.0). Electronically Signed   By: Abigail Miyamoto M.D.   On: 08/16/2017 10:51   Dg Chest Portable 1 View  Result Date: 08/15/2017 CLINICAL DATA:  Respiratory failure EXAM: PORTABLE CHEST 1 VIEW COMPARISON:  08/14/2017 FINDINGS: Support devices are stable. Cardiomegaly with vascular congestion. Bilateral airspace disease again noted, right greater than left, likely a combination of asymmetric edema and atelectasis, unchanged. Layering bilateral effusions. IMPRESSION: Continued CHF, bilateral effusions and bibasilar atelectasis. No real change. Electronically Signed   By: Rolm Baptise M.D.   On: 08/15/2017 09:19   Dg Chest Port 1 View  Result Date: 08/14/2017 CLINICAL DATA:  ETT present Dimas Millin failure EXAM: PORTABLE CHEST 1 VIEW COMPARISON:  08/13/2017 FINDINGS: The patient has a left-sided transvenous pacemaker with leads to the right atrium and right ventricle (off the image). A nasogastric tube is in place, tip off the film beyond the gastroesophageal junction. An endotracheal  tube is in place, tip approximately 2 centimeters above the carina. There has been some improvement in aeration since the prior study. Significant bibasilar opacities persist which obscure the hemidiaphragms. The heart size is accentuated by technique. IMPRESSION: 1. Slight improvement in pulmonary edema. 2. Persistent bibasilar opacities. Electronically Signed   By: Nolon Nations M.D.   On: 08/14/2017 08:25   Dg Chest Port 1 View  Result Date: 08/13/2017 CLINICAL DATA:  Intubated patient.  Respiratory failure. EXAM: PORTABLE CHEST 1 VIEW COMPARISON:  08/11/2017 FINDINGS: Lung base opacity has increased when compared to the prior exam, now obscuring both hemidiaphragms. There is persistent interstitial thickening with hazy central airspace opacities consistent with pulmonary edema. No pneumothorax. Endotracheal tube and nasal/orogastric tube are stable in well positioned. IMPRESSION: 1. There is an increase in opacity at the lung bases compared to the previous day's study, which is consistent with enlargement of bilateral pleural effusions. 2. Persistent pulmonary edema, stable from the prior study. 3. Support apparatus is stable and well positioned. Electronically Signed   By: Lajean Manes M.D.   On: 08/13/2017 09:10   Dg Chest Portable 1 View  Result Date: 08/11/2017 CLINICAL DATA:  Intubation EXAM: PORTABLE CHEST 1 VIEW COMPARISON:  11/07/2016 FINDINGS: Endotracheal tube in good position. Duo lead pacemaker unchanged in position. NG tube in the stomach. Cardiac enlargement. Diffuse bilateral airspace disease most consistent with edema. Small right effusion. Bibasilar atelectasis. IMPRESSION: Endotracheal tube in good position Diffuse bilateral airspace disease most consistent with pulmonary edema. Electronically Signed   By: Franchot Gallo M.D.   On: 08/11/2017 14:38   Dg Abd Portable 1v  Result Date: 08/14/2017 CLINICAL DATA:  Increased orogastric tube output.  Respiratory failure, intubated  patient. EXAM: PORTABLE ABDOMEN - 1 VIEW COMPARISON:  Chest x-ray of today's date. FINDINGS: The esophagogastric tube tip in proximal port lie in the mid to distal gastric body. The bowel gas pattern is unremarkable. A femoral catheter is in place on the right. IMPRESSION: No evidence of bowel obstruction or ileus. Reasonable positioning of the esophagogastric tube. Electronically Signed   By: David  Martinique M.D.   On: 08/14/2017 10:23   Dg Swallowing Func-speech Pathology  Result Date: 08/19/2017 Objective Swallowing Evaluation: Type of Study: MBS-Modified Barium Swallow Study  Patient Details Name: Malakie Balis MRN: [id number] Date of Birth: Feb 24, 1932 Today's Date: 08/19/2017 Time: SLP Start Time (ACUTE ONLY): 1148 -SLP Stop Time (ACUTE ONLY): 1205 SLP Time Calculation (min) (ACUTE ONLY): 17 min Past Medical History: Past Medical History: Diagnosis Date . Basal cell carcinoma   "scalp, arms, right face;  some cut, some burndt off" . CAD (coronary artery disease)   LHC 3/14: Distal left main 10%, LAD 20%, circumflex 10-20%, proximal RCA 20%, distal 40-50%, EF 50-55%. . Chronic combined systolic and diastolic CHF, NYHA class 4 ([place]) 11/06/2016  Echo 4/18: severe LVH, EF 45-50, global HK, Gr 1 DD, mild AS (mean 9/peak 17), MAC, mod LAE, trivial eff . Complete heart block (HCC)   Status post Medtronic pacemaker . High cholesterol  . HTN (hypertension)  . Hx of cardiovascular stress test   a. Silvis MV 3/14:  Low risk, EF 36%, apical inf scar, no ischemia, inf-apical HK>> catheterization however demonstrated normal left ventricular function . Melanoma ([place])   back . Obesity  . Pneumonia 1940 . Presence of permanent cardiac pacemaker   Medtronic . Type II diabetes mellitus ([place])  . Urinary frequency  . Urinary incontinence  Past Surgical History: Past Surgical History: Procedure Laterality Date . APPENDECTOMY  40 years ago . BASAL CELL CARCINOMA EXCISION    "scalp, right face" . INSERT / REPLACE / REMOVE PACEMAKER  ~  1999  medtronic EnRhythm . MOHS SURGERY Left   ear; being monitored-every 6 months . PERMANENT PACEMAKER GENERATOR CHANGE N/A 02/12/2013  Procedure: PERMANENT PACEMAKER GENERATOR CHANGE;  Surgeon: Deboraha Sprang, MD;  Location: [facility] CATH LAB;  Service: Cardiovascular;  Laterality: N/A; . TONSILLECTOMY    as child . TRANSURETHRAL RESECTION OF PROSTATE N/A 10/13/2014  Procedure: WOLF TRANSURETHRAL RESECTION OF THE PROSTATE (TURP);  Surgeon: Carolan Clines, MD;  Location: [facility] ORS;  Service: Urology;  Laterality: N/A; HPI: This is a 84 year old man history of coronary artery disease, DM, HTN, complete heart block, pacemaker presents today status post cardiac arrest.Per chart resuscitation was 37 minutes with paced rhythm followed by return of pulses. Intubated 1/7-1/14. CXR 1/15 Aortic atherosclerosis. Endotracheal and nasogastric tubes have been removed. Stable bibasilar atelectasis or edema is noted with associated pleural effusions. CXR 1/14 CHF with slight interval decrease in interstitial edema. Persistent bibasilar pleural effusions layering posteriorly.  No Data Recorded Assessment / Plan / Recommendation CHL IP CLINICAL IMPRESSIONS 08/19/2017 Clinical Impression Pt's impairments are primarily related to oral phase with decreased lingual manipulation, lingual residue and decreased labial seal with difficulty transiting liquid into oral cavity. Laryngeal penetration with larger sip nectar and straw mild, above vocal cords. Overall timing of swallow was functional with one episode of trigger at valleculae. Mild vallecular residue due to reduced epiglottic inversion. Primary barriers to safe swallow are pt's suspected incomplete vocal cord adduction indicated by hoarse quality. Recommend nectar thick liquids, Dys 2, crush pills and assist for swallow precautions.  Will see while in hospital.   SLP Visit Diagnosis Dysphagia, oropharyngeal phase (R13.12) Attention and concentration deficit following -- Frontal lobe and  executive function deficit following -- Impact on safety and function Moderate aspiration risk   CHL IP TREATMENT RECOMMENDATION 08/19/2017 Treatment Recommendations Therapy as outlined in treatment plan below   Prognosis 08/19/2017 Prognosis for Safe Diet Advancement Good Barriers to Reach Goals Behavior Barriers/Prognosis Comment -- CHL IP DIET RECOMMENDATION 08/19/2017 SLP Diet Recommendations Dysphagia 2 (Fine chop) solids;Nectar thick liquid Liquid Administration via Cup;No straw Medication Administration Crushed with puree Compensations Slow rate;Small sips/bites Postural Changes Seated upright at 90 degrees   CHL IP OTHER RECOMMENDATIONS 08/19/2017 Recommended Consults -- Oral Care Recommendations Oral care BID Other Recommendations --   CHL IP FOLLOW UP RECOMMENDATIONS 08/19/2017 Follow up Recommendations (No Data)   CHL IP FREQUENCY AND DURATION 08/19/2017 Speech Therapy Frequency (ACUTE ONLY) min 2x/week Treatment Duration 2 weeks      CHL IP ORAL PHASE 08/19/2017 Oral Phase Impaired Oral - Pudding Teaspoon -- Oral - Pudding Cup -- Oral - Honey Teaspoon -- Oral - Honey Cup Lingual/palatal residue Oral - Nectar Teaspoon -- Oral - Nectar Cup Lingual/palatal residue Oral - Nectar Straw Lingual/palatal residue Oral - Thin Teaspoon -- Oral - Thin Cup -- Oral - Thin Straw -- Oral - Puree Lingual/palatal residue Oral - Mech Soft Lingual/palatal residue Oral - Regular -- Oral - Multi-Consistency -- Oral - Pill -- Oral Phase - Comment --  CHL IP PHARYNGEAL PHASE 08/19/2017 Pharyngeal Phase Impaired Pharyngeal- Pudding Teaspoon -- Pharyngeal -- Pharyngeal- Pudding Cup -- Pharyngeal -- Pharyngeal- Honey Teaspoon -- Pharyngeal -- Pharyngeal- Honey Cup WFL Pharyngeal -- Pharyngeal- Nectar Teaspoon -- Pharyngeal -- Pharyngeal- Nectar Cup Delayed swallow initiation-vallecula Pharyngeal -- Pharyngeal- Nectar Straw Penetration/Aspiration during swallow Pharyngeal Material enters airway, remains ABOVE vocal cords and not ejected  out Pharyngeal- Thin Teaspoon -- Pharyngeal -- Pharyngeal- Thin Cup -- Pharyngeal -- Pharyngeal- Thin Straw -- Pharyngeal -- Pharyngeal- Puree WFL Pharyngeal -- Pharyngeal- Mechanical Soft Pharyngeal residue - valleculae Pharyngeal -- Pharyngeal- Regular -- Pharyngeal -- Pharyngeal- Multi-consistency -- Pharyngeal -- Pharyngeal- Pill -- Pharyngeal -- Pharyngeal Comment --  CHL IP CERVICAL ESOPHAGEAL PHASE 08/19/2017 Cervical Esophageal Phase WFL Pudding Teaspoon -- Pudding Cup -- Honey Teaspoon -- Honey Cup -- Nectar Teaspoon -- Nectar Cup -- Nectar Straw -- Thin Teaspoon -- Thin Cup -- Thin Straw -- Puree -- Mechanical Soft -- Regular -- Multi-consistency -- Pill -- Cervical Esophageal Comment -- No flowsheet data found. Houston Siren 08/19/2017, 1:44 PM  Orbie Pyo Colvin Caroli.Ed CCC-SLP Pager [phone]                 Subjective: Patient is feeling better, dyspnea at baseline, no chest pain, no nausea or vomiting.  Discharge Exam: Vitals:   08/25/17 2021 08/10/2017 0550  BP: (!) 143/53 (!) 142/59  Pulse: 86 63  Resp: 18 18  Temp: 97.8 F (36.6 C) 98.2 F (36.8 C)  SpO2: 99% 96%   Vitals:   08/25/17 0522 08/25/17 1218 08/25/17 2021 08/11/2017 0550  BP: (!) 137/49 (!) 137/50 (!) 143/53 (!) 142/59  Pulse: 64 (!) 59 86 63  Resp: 18 18 18 18   Temp: 97.6 F (36.4 C) 98.4 F (36.9 C) 97.8 F (36.6 C) 98.2 F (36.8 C)  TempSrc: Oral Oral Oral Oral  SpO2: 94% 98% 99% 96%  Weight: 96.7 kg (213 lb 3 oz)   96.3 kg (212 lb 4.9 oz)  Height:  General: Not in pain or dyspnea, deconditioned Neurology: Awake and alert, non focal  E ENT: mild pallor, no icterus, oral mucosa moist Cardiovascular: No JVD. S1-S2 present, rhythmic, no gallops, rubs, or murmurs. Trace lower extremity edema. Pulmonary: vesicular breath sounds bilaterally, adequate air movement, no wheezing, rhonchi or rales. Mild decreased breath sounds at bases Gastrointestinal. Abdomen protuberant, no organomegaly, non  tender, no rebound or guarding Skin. Bilateral discoloration at the distal legs.  Musculoskeletal: no joint deformities   The results of significant diagnostics from this hospitalization (including imaging, microbiology, ancillary and laboratory) are listed below for reference.     Microbiology: Recent Results (from the past 240 hour(s))  MRSA PCR Screening     Status: None   Collection Time: 08/20/17 11:55 PM  Result Value Ref Range Status   MRSA by PCR NEGATIVE NEGATIVE Final    Comment:        The GeneXpert MRSA Assay (FDA approved for NASAL specimens only), is one component of a comprehensive MRSA colonization surveillance program. It is not intended to diagnose MRSA infection nor to guide or monitor treatment for MRSA infections.      Labs: BNP (last 3 results) Recent Labs    11/06/16 1656  BNP 448.1*   Basic Metabolic Panel: Recent Labs  Lab 08/20/17 0402 08/21/17 0614 08/22/17 0226 08/22/17 1843 08/24/17 1132 08/25/17 0408 08/27/2017 0447  NA 149* 148* 146*  --  142 143 143  K 4.3 3.5 4.0  --  4.6 5.1 4.7  CL 106 105 106  --  105 104 106  CO2 27 26 27   --  24 24 25   GLUCOSE 151* 175* 243*  --  286* 191* 166*  BUN 137* 137* 141*  --  127* 128* 108*  CREATININE 4.43* 4.10* 3.88* 3.78* 3.43* 3.30* 2.95*  CALCIUM 9.3 9.1 8.7*  --  8.7* 9.0 9.0  MG 2.5* 2.4  --   --   --   --  2.3  PHOS 6.8* 5.9*  --   --   --   --   --    Liver Function Tests: No results for input(s): AST, ALT, ALKPHOS, BILITOT, PROT, ALBUMIN in the last 168 hours. No results for input(s): LIPASE, AMYLASE in the last 168 hours. No results for input(s): AMMONIA in the last 168 hours. CBC: Recent Labs  Lab 08/20/17 0402 08/21/17 0614 08/22/17 0226 08/22/17 1843 08/23/17 0833 08/24/17 1132 08/25/17 0408  WBC 15.0* 12.4* 10.9* 11.8* 12.5* 9.4 11.7*  NEUTROABS 12.3* 10.1*  --   --   --   --   --   HGB 14.6 14.9 13.4 13.7 14.3 12.8* 13.4  HCT 44.7 46.0 41.4 42.2 44.8 40.3 42.0  MCV  97.6 97.9 97.0 97.5 98.2 98.3 98.1  PLT 295 317 286 320 267 253 271   Cardiac Enzymes: No results for input(s): CKTOTAL, CKMB, CKMBINDEX, TROPONINI in the last 168 hours. BNP: Invalid input(s): POCBNP CBG: Recent Labs  Lab 08/25/17 0746 08/25/17 1103 08/25/17 1639 08/25/17 2057 08/15/2017 0718  GLUCAP 165* 229* 261* 124* 186*   D-Dimer No results for input(s): DDIMER in the last 72 hours. Hgb A1c No results for input(s): HGBA1C in the last 72 hours. Lipid Profile No results for input(s): CHOL, HDL, LDLCALC, TRIG, CHOLHDL, LDLDIRECT in the last 72 hours. Thyroid function studies No results for input(s): TSH, T4TOTAL, T3FREE, THYROIDAB in the last 72 hours.  Invalid input(s): FREET3 Anemia work up No results for input(s): VITAMINB12, FOLATE, FERRITIN, TIBC, IRON,  RETICCTPCT in the last 72 hours. Urinalysis    Component Value Date/Time   COLORURINE STRAW (A) 11/07/2016 2059   APPEARANCEUR CLEAR 11/07/2016 2059   LABSPEC 1.008 11/07/2016 2059   PHURINE 5.0 11/07/2016 2059   GLUCOSEU NEGATIVE 11/07/2016 2059   HGBUR NEGATIVE 11/07/2016 2059   BILIRUBINUR NEG 07/07/2017 1556   KETONESUR NEGATIVE 11/07/2016 2059   PROTEINUR 2+ 07/07/2017 1556   PROTEINUR 30 (A) 11/07/2016 2059   UROBILINOGEN negative (A) 07/07/2017 1556   UROBILINOGEN 0.2 01/08/2013 2045   NITRITE NEG 07/07/2017 1556   NITRITE NEGATIVE 11/07/2016 2059   LEUKOCYTESUR Negative 07/07/2017 1556   Sepsis Labs Invalid input(s): PROCALCITONIN,  WBC,  LACTICIDVEN Microbiology Recent Results (from the past 240 hour(s))  MRSA PCR Screening     Status: None   Collection Time: 08/20/17 11:55 PM  Result Value Ref Range Status   MRSA by PCR NEGATIVE NEGATIVE Final    Comment:        The GeneXpert MRSA Assay (FDA approved for NASAL specimens only), is one component of a comprehensive MRSA colonization surveillance program. It is not intended to diagnose MRSA infection nor to guide or monitor treatment  for MRSA infections.      Time coordinating discharge: 45 minutes  SIGNED:   Tawni Millers, MD  [facility] 08/17/2017, 10:08 AM Pager [phone]  If 7PM-7AM, please contact night-coverage [email or web address] Password [email or web address]

## 2017-08-27 ENCOUNTER — Inpatient Hospital Stay (HOSPITAL_COMMUNITY): Payer: Medicare Other | Admitting: Speech Pathology

## 2017-08-27 ENCOUNTER — Inpatient Hospital Stay (HOSPITAL_COMMUNITY): Payer: Medicare Other | Admitting: Occupational Therapy

## 2017-08-27 ENCOUNTER — Encounter (HOSPITAL_COMMUNITY): Payer: Self-pay

## 2017-08-27 ENCOUNTER — Inpatient Hospital Stay (HOSPITAL_COMMUNITY): Payer: Medicare Other | Admitting: Physical Therapy

## 2017-08-27 LAB — COMPREHENSIVE METABOLIC PANEL
ALK PHOS: 75 U/L (ref 38–126)
ALT: 17 U/L (ref 17–63)
ANION GAP: 12 (ref 5–15)
AST: 22 U/L (ref 15–41)
Albumin: 3 g/dL — ABNORMAL LOW (ref 3.5–5.0)
BILIRUBIN TOTAL: 0.8 mg/dL (ref 0.3–1.2)
BUN: 107 mg/dL — AB (ref 6–20)
CALCIUM: 9.1 mg/dL (ref 8.9–10.3)
CO2: 28 mmol/L (ref 22–32)
Chloride: 104 mmol/L (ref 101–111)
Creatinine, Ser: 2.99 mg/dL — ABNORMAL HIGH (ref 0.61–1.24)
GFR calc Af Amer: 21 mL/min — ABNORMAL LOW (ref >60)
GFR calc non Af Amer: 18 mL/min — ABNORMAL LOW (ref >60)
Glucose, Bld: 167 mg/dL — ABNORMAL HIGH (ref 65–99)
POTASSIUM: 5.2 mmol/L — AB (ref 3.5–5.1)
Sodium: 144 mmol/L (ref 135–145)
Total Protein: 6.1 g/dL — ABNORMAL LOW (ref 6.5–8.1)

## 2017-08-27 LAB — GLUCOSE, CAPILLARY
GLUCOSE-CAPILLARY: 145 mg/dL — AB (ref 65–99)
GLUCOSE-CAPILLARY: 175 mg/dL — AB (ref 65–99)
Glucose-Capillary: 152 mg/dL — ABNORMAL HIGH (ref 65–99)

## 2017-08-27 LAB — CBC WITH DIFFERENTIAL/PLATELET
Basophils Absolute: 0 10*3/uL (ref 0.0–0.1)
Basophils Relative: 0 %
EOS PCT: 3 %
Eosinophils Absolute: 0.3 10*3/uL (ref 0.0–0.7)
HEMATOCRIT: 41.7 % (ref 39.0–52.0)
Hemoglobin: 13.4 g/dL (ref 13.0–17.0)
LYMPHS ABS: 1.4 10*3/uL (ref 0.7–4.0)
LYMPHS PCT: 16 %
MCH: 31.8 pg (ref 26.0–34.0)
MCHC: 32.1 g/dL (ref 30.0–36.0)
MCV: 98.8 fL (ref 78.0–100.0)
MONO ABS: 0.5 10*3/uL (ref 0.1–1.0)
MONOS PCT: 6 %
Neutro Abs: 6.7 10*3/uL (ref 1.7–7.7)
Neutrophils Relative %: 75 %
Platelets: 231 10*3/uL (ref 150–400)
RBC: 4.22 MIL/uL (ref 4.22–5.81)
RDW: 14.5 % (ref 11.5–15.5)
WBC: 8.9 10*3/uL (ref 4.0–10.5)

## 2017-08-27 LAB — PROTIME-INR
INR: 2.26
Prothrombin Time: 24.8 seconds — ABNORMAL HIGH (ref 11.4–15.2)

## 2017-08-27 MED ORDER — WARFARIN SODIUM 5 MG PO TABS
5.0000 mg | ORAL_TABLET | Freq: Once | ORAL | Status: AC
  Administered 2017-08-27: 5 mg via ORAL
  Filled 2017-08-27: qty 1

## 2017-08-27 NOTE — Evaluation (Addendum)
Physical Therapy Assessment and Plan  Patient Details  Name: Malik Rojas MRN: [id number] Date of Birth: 06-Oct-1931  PT Diagnosis: Abnormality of gait, Cognitive deficits, Difficulty walking, Impaired cognition and Muscle weakness Rehab Potential: Fair ELOS: 3-4 weeks   Today's Date: 08/27/2017 PT Individual Time: 1009-1105 PT Individual Time Calculation (min): 56 min    Problem List:  Patient Active Problem List   Diagnosis Date Noted  . Anoxic encephalopathy ([place]) 08/25/2017  . Oropharyngeal dysphagia   . Diabetes mellitus type 2 in obese ([place])   . Pressure injury of skin 08/22/2017  . Palliative care by specialist   . DNR (do not resuscitate)   . Weakness generalized   . Respiratory failure ([place])   . Acute renal failure superimposed on stage 3 chronic kidney disease ([place]) 08/18/2017  . Acute pulmonary edema (HCC)   . Cardiac arrest ([place]) 08/11/2017  . Acute hypercapnic respiratory failure ([place]) 08/11/2017  . Uncontrolled hypertension 08/11/2017  . Acute on chronic combined systolic and diastolic CHF, NYHA class 3 ([place])   . Hypertensive retinopathy of left eye, grade 1 03/25/2017  . Controlled diabetes mellitus type 2 with complications ([place]) 92/42/6834  . Chronic combined systolic and diastolic CHF (congestive heart failure) ([place]) 11/06/2016  . Squamous cell carcinoma of skin 02/20/2016  . Chronic acquired lymphedema 08/02/2015  . Mixed incontinence 03/23/2015  . Benign prostatic hyperplasia with weak urinary stream 10/13/2014  . Hyperlipidemia associated with type 2 diabetes mellitus ([place]) 05/25/2013  . Chronic kidney disease, stage III (moderate) ([place]) 05/25/2013  . Diabetes mellitus due to underlying condition with diabetic nephropathy, without long-term current use of insulin ([place]) 10/01/2011  . Xerostomia 08/28/2011  . Hypertensive heart disease with CHF (congestive heart failure) ([place]) 08/28/2011  . Obesity (BMI 30-39.9) 05/30/2011  . Pacemaker-Medtronic  11/21/2010  . DEPRESSION/ANXIETY 08/29/2009  . Obstructive sleep apnea 07/21/2009    Past Medical History:  Past Medical History:  Diagnosis Date  . Basal cell carcinoma    "scalp, arms, right face;  some cut, some burndt off"  . CAD (coronary artery disease)    LHC 3/14: Distal left main 10%, LAD 20%, circumflex 10-20%, proximal RCA 20%, distal 40-50%, EF 50-55%.  . Chronic combined systolic and diastolic CHF, NYHA class 4 ([place]) 11/06/2016   Echo 4/18: severe LVH, EF 45-50, global HK, Gr 1 DD, mild AS (mean 9/peak 17), MAC, mod LAE, trivial eff  . Complete heart block (HCC)    Status post Medtronic pacemaker  . High cholesterol   . HTN (hypertension)   . Hx of cardiovascular stress test    a. [place] MV 3/14:  Low risk, EF 36%, apical inf scar, no ischemia, inf-apical HK>> catheterization however demonstrated normal left ventricular function  . Melanoma ([place])    back  . Obesity   . Pneumonia 1940  . Presence of permanent cardiac pacemaker    Medtronic  . Type II diabetes mellitus ([place])   . Urinary frequency   . Urinary incontinence    Past Surgical History:  Past Surgical History:  Procedure Laterality Date  . APPENDECTOMY  40 years ago  . BASAL CELL CARCINOMA EXCISION     "scalp, right face"  . INSERT / REPLACE / REMOVE PACEMAKER  ~ 1999   medtronic EnRhythm  . MOHS SURGERY Left    ear; being monitored-every 6 months  . PERMANENT PACEMAKER GENERATOR CHANGE N/A 02/12/2013   Procedure: PERMANENT PACEMAKER GENERATOR CHANGE;  Surgeon: Deboraha Sprang, MD;  [facility];  Service: Cardiovascular;  Laterality: N/A;  . TONSILLECTOMY     as child  . TRANSURETHRAL RESECTION OF PROSTATE N/A 10/13/2014   Procedure: WOLF TRANSURETHRAL RESECTION OF THE PROSTATE (TURP);  Surgeon: Carolan Clines, MD;  Location: [facility] ORS;  Service: Urology;  Laterality: N/A;    Assessment & Plan Clinical Impression: Patient is a 84 y.o. year old male with history of HTN, DMT2, chronic combined  CHF, NICM s/p PPM, recent URI symptoms; who was admitted on 08/11/2017 after cardiac arrest. EMS noted faint pulse with VF as initial rhythm. He required 37 Minutes CPR with ACLS protocol and pulse regained on arrival to ED. He was responsive and intubated at admission. Not felt to be a candidate for cardiac cath and treated with cooling. 2D echo showed diffuse hypokinesis, severe calcification on MV with mild MVR and severe LVH and EF 25-30 %. CT head showed global atrophy, no hemorrhage and poor definition of anterolateral thalamus and lenticular nucleus question due to changes of hypoxia. Blood cultures X 2 negative. He was treated with diuresis due to fluid overload and tolerated extubation without difficulty on 01/14. He was found to have DVT Right CFV to distal iliac vein and started on IV heparin bridge to coumadin. Cardiac arrest felt to be due to respiratory event leading cardiopulmonary arrest and question plans for ICD once stable. He was placed on dysphagia 2 diet with nectar liquids due to dysphagia. CHF compensated and AKI resolving. Repeat cardiac echo 1/20 revealed EF improved to 40-45% and no plans for ischemic work up. Has mild leucocytosis without evidence of infection. Confusion and lethargy is resolving but patient noted to be debilitated with evidence of anoxic encephalopathy. CIR recommended due to functional deficits.  Patient transferred to CIR on 08/22/2017 .   Patient currently requires max with mobility secondary to muscle weakness, decreased cardiorespiratoy endurance and decreased oxygen support, decreased coordination, decreased initiation, decreased attention, decreased awareness, decreased problem solving, decreased safety awareness, decreased memory and delayed processing, and decreased standing balance, decreased postural control and decreased balance strategies.  Prior to hospitalization, patient was modified independent  with mobility and lived with Spouse in a House home.  Home  access is 2(3 inches tall)Stairs to enter.  Patient will benefit from skilled PT intervention to maximize safe functional mobility, minimize fall risk and decrease caregiver burden for planned discharge home with 24 hour assist.  Anticipate patient will benefit from follow up [facility] at discharge.  PT - End of Session Activity Tolerance: Tolerates 30+ min activity with multiple rests Endurance Deficit: Yes Endurance Deficit Description: 2/2 fatigue, decreased cardiopulmonary support PT Assessment Rehab Potential (ACUTE/IP ONLY): Fair PT Barriers to Discharge: Home environment access/layout;Lack of/limited family support;Behavior PT Barriers to Discharge Comments: wife unable to provide physical assistance upon d/c, will need rail installed at home if possible PT Patient demonstrates impairments in the following area(s): Balance;Safety;Behavior;Edema;Skin Integrity;Endurance;Motor;Perception;Pain PT Transfers Functional Problem(s): Bed Mobility;Car;Bed to Chair;Furniture PT Locomotion Functional Problem(s): Wheelchair Mobility;Ambulation;Stairs PT Plan PT Intensity: Minimum of 1-2 x/day ,45 to 90 minutes PT Frequency: 5 out of 7 days PT Duration Estimated Length of Stay: 3-4 weeks PT Treatment/Interventions: Community reintegration;Ambulation/gait training;DME/adaptive equipment instruction;Neuromuscular re-education;Psychosocial support;Stair training;UE/LE Strength taining/ROM;Wheelchair propulsion/positioning;UE/LE Coordination activities;Therapeutic Activities;Skin care/wound management;Pain management;Discharge planning;Balance/vestibular training;Cognitive remediation/compensation;Disease management/prevention;Functional mobility training;Patient/family education;Splinting/orthotics;Therapeutic Exercise;Visual/perceptual remediation/compensation PT Transfers Anticipated Outcome(s): min assist overall PT Locomotion Anticipated Outcome(s): min assist overall with LRAD PT  Recommendation Recommendations for Other Services: Speech consult;Neuropsych consult;Therapeutic Recreation consult Therapeutic Recreation Interventions: Pet therapy Follow Up Recommendations: 24 hour  supervision/assistance;Home health PT Patient destination: Home Equipment Recommended: To be determined  Skilled Therapeutic Intervention Patient received in bed with daughter Horris Latino) and wife Henrine Screws) arriving during session. Pt on 2L/min supplemental oxygen throughout session and SpO2 remained >90% throughout (HR ranged from 63-68 bpm). Therapist educated pt & family on weekly interdisciplinary team meeting, daily therapy schedule, safety plan, pt's diet & need for supervision from staff only, and other various CIR information. Provided pt with 20x18 w/c for increased comfort and positioning when OOB. Pt completes functional mobility as noted below. Pt with significantly impaired static standing balance and appears to be limited by confusion when attempting to ambulate. At end of session pt left sitting in w/c in room with QRB donned, family present to supervise, and all needs within reach.   Addendum: Discussed home set up with pt's wife who reports they have 2 steps without rails to enter home. Educated her on potential need to have rail installed prior to pt's d/c.  PT Evaluation Precautions/Restrictions Precautions Precautions: Fall Restrictions Weight Bearing Restrictions: No   General Chart Reviewed: Yes Additional Pertinent History: HTN, DM 2, chronic combined CHF, NICM s/p PPM, CAD, melanoma, basal cell carcinoma, HLD, obesity, permanent cardiac pacemaker, complete heart block Response to Previous Treatment: Patient with no complaints from previous session. Family/Caregiver Present: Yes(daughter - Horris Latino & wife - Jolene) Vital Signs   Pain Pain Assessment Pain Assessment: No/denies pain   Home Living/Prior Functioning Home Living Available Help at Discharge: Family(cannot  provide physical assistance upon d/c) Type of Home: House Home Access: Stairs to enter Entrance Stairs-Number of Steps: 2(3 inches tall) Entrance Stairs-Rails: None Home Layout: One level  Lives With: Spouse Prior Function Level of Independence: Independent with basic ADLs;Independent with transfers(ambulatory without AD in home, used [facility] in community)  Able to Take Stairs?: Yes Driving: Yes   Vision/Perception  Pt wears glasses at all times at baseline but does not have them with him here today.  Cognition Overall Cognitive Status: Impaired/Different from baseline Arousal/Alertness: Lethargic Orientation Level: Oriented to person;Oriented to place Awareness: Impaired Problem Solving: Impaired Problem Solving Impairment: Functional basic Safety/Judgment: Impaired   Motor  Motor Motor - Skilled Clinical Observations: general weakness, poor endurance   Mobility Bed Mobility Bed Mobility: Supine to Sit Supine to Sit: 5: Supervision;With rails;HOB elevated Transfers Transfers: Yes Sit to Stand: 2: Max assist;With armrests Sit to Stand Details: Tactile cues for initiation;Tactile cues for weight shifting;Tactile cues for sequencing;Tactile cues for posture;Manual facilitation for weight shifting Stand to Sit: 2: Max assist(poor eccentric control) Stand to Sit Details (indicate cue type and reason): Manual facilitation for placement;Tactile cues for placement Squat Pivot Transfers: 3: Mod assist;With armrests;From elevated surface Squat Pivot Transfer Details: Tactile cues for weight shifting;Tactile cues for sequencing;Tactile cues for posture;Tactile cues for placement;Verbal cues for sequencing;Verbal cues for technique;Manual facilitation for weight shifting;Manual facilitation for placement   Locomotion  Ambulation Ambulation: Yes Ambulation/Gait Assistance: (max assist + 2nd person to manage oxygen tank) Ambulation Distance (Feet): 2 Feet Assistive device: Rolling  walker Stairs / Additional Locomotion Stairs: No Wheelchair Mobility Wheelchair Mobility: No   Trunk/Postural Assessment  Cervical Assessment Cervical Assessment: (forward head) Postural Control Postural Control: Deficits on evaluation   Balance Balance Balance Assessed: Yes Static Sitting Balance Static Sitting - Balance Support: Feet supported Static Sitting - Level of Assistance: 5: Stand by assistance Static Standing Balance Static Standing - Balance Support: Bilateral upper extremity supported Static Standing - Level of Assistance: 2: Max assist   Extremity Assessment  RLE Assessment RLE Assessment: Within Functional Limits LLE Assessment LLE Assessment: Within Functional Limits   See Function Navigator for Current Functional Status.   Refer to Care Plan for Long Term Goals  Recommendations for other services: Neuropsych and Therapeutic Recreation  Pet therapy  Discharge Criteria: Patient will be discharged from PT if patient refuses treatment 3 consecutive times without medical reason, if treatment goals not met, if there is a change in medical status, if patient makes no progress towards goals or if patient is discharged from hospital.  The above assessment, treatment plan, treatment alternatives and goals were discussed and mutually agreed upon: by patient and by family  [place] 08/27/2017, 12:48 PM

## 2017-08-27 NOTE — Progress Notes (Signed)
Occupational Therapy Session Note  Patient Details  Name: Malik Rojas MRN: [id number] Date of Birth: Aug 29, 1931  Today's Date: 08/27/2017 OT Individual Time: 1430-1455 OT Individual Time Calculation (min): 25 min     Skilled Therapeutic Interventions/Progress Updates:   Pt presented sitting up in w/c, multiple family members present, with increased fatigue this afternoon though agreeable to OT session. Pt's family reporting Pt wishes to shave. Pt positioned in front of mirror at sink, completing shaving using electric razor with close minGuard and intermittently HOH assist for safety during task completion. Pt requires cues for thoroughness and to complete task on both sides, alternating between using each hand. Pt washes face and brushes hair with setup assist and verbal cues for washing/brushing all areas. Pt requires cues to attend to task at hand as Pt intermittently dozing off. Pt's family with questions regarding CIR process and OT purpose with education provided and questions answered throughout. Pt's spouse wishing for Pt to drink more fluids this PM, provided orange juice at appropriate thickness and Pt drinking using bil hands to hold cup with supervision provided and verbal cues for taking small sips, Pt appearing more alert and attentive when presented with juice. Pt left seated in w/c end of session with QRB donned, needs within reach, family present.     Therapy Documentation Precautions:  Precautions Precautions: Fall Restrictions Weight Bearing Restrictions: No   Pain: Pain Assessment Pain Assessment: No/denies pain     See Function Navigator for Current Functional Status.   Therapy/Group: Individual Therapy  Malik Rojas 08/27/2017, 4:17 PM

## 2017-08-27 NOTE — Progress Notes (Signed)
Patient information reviewed and entered into eRehab system by Daiva Nakayama, RN, CRRN, [place] Coordinator.  Information including medical coding and functional independence measure will be reviewed and updated through discharge.     Per nursing patient and daughter given "Data Collection Information Summary for Patients in Inpatient Rehabilitation Facilities with attached "Privacy Act [place] Records" upon admission.

## 2017-08-27 NOTE — Progress Notes (Signed)
[facility] PHYSICAL MEDICINE & REHABILITATION     PROGRESS NOTE    Subjective/Complaints: Had a fair night. Up with OT. Sitting at EOB and has not even finished breakfast. Working on it for an hour per OT. "I want to go home"  ROS: Limited due to cognitive/behavioral   Objective: Vital Signs: Blood pressure (!) 137/45, pulse 70, temperature 98.1 F (36.7 C), temperature source Oral, resp. rate 16, weight 96 kg (211 lb 10.3 oz), SpO2 96 %. No results found. Recent Labs    08/24/17 1132 08/25/17 0408  WBC 9.4 11.7*  HGB 12.8* 13.4  HCT 40.3 42.0  PLT 253 271   Recent Labs    08/25/17 0408 08/05/2017 0447  NA 143 143  K 5.1 4.7  CL 104 106  GLUCOSE 191* 166*  BUN 128* 108*  CREATININE 3.30* 2.95*  CALCIUM 9.0 9.0   CBG (last 3)  Recent Labs    08/29/2017 0718 08/08/2017 1106 08/06/2017 1650  GLUCAP 186* 215* 275*    Wt Readings from Last 3 Encounters:  08/27/17 96 kg (211 lb 10.3 oz)  08/16/2017 96.3 kg (212 lb 4.9 oz)  07/22/17 105.2 kg (232 lb)    Physical Exam:  Gen: at eob. No distress.   HENT:  Head: Normocephalic and atraumatic.  Mouth/Throat: Oropharynx is clear and moist.  Eyes: Conjunctivae and EOM a about his his FMLA or whatever was I do not know I asked a question of return the message is what exactly her we giving question about need to have the information Hande if I call us so I have re normal. Pupils are equal, round, and reactive to light.  Neck: Normal range of motion. Neck supple.  Cardiovascular: RRR without murmur. No JVD .  Respiratory: CTA Bilaterally without wheezes or rales. Normal effort  GI: Soft. Bowel sounds are normal. He exhibits no distension. There is no tenderness.  Musculoskeletal: He exhibits edema.  Edema RLE. Stasis changes BLE with flaky skin. Scabbed area on left shin with dry bloody drainage on dressing.  Neurological: He is alert, oriented to person, place, and time   quicker processing today but not too cooperative.  Moves  all 4.  Has fair sitting balance although slumped somewhat forward.  Skin: Skin is warm and dry. He is not diaphoretic.  Psychiatric: His affect is blunt. His speech is delayed and slurred. He is slowed. Cognition and memory are impaired.  Decreased insight and awareness    Assessment/Plan: 1.  Functional and cognitive deficits secondary to anoxic brain injury/debility which require 3+ hours per day of interdisciplinary therapy in a comprehensive inpatient rehab setting. Physiatrist is providing close team supervision and 24 hour management of active medical problems listed below. Physiatrist and rehab team continue to assess barriers to discharge/monitor patient progress toward functional and medical goals.  Function:  Bathing Bathing position      Bathing parts      Bathing assist        Upper Body Dressing/Undressing Upper body dressing                    Upper body assist        Lower Body Dressing/Undressing Lower body dressing                                  Lower body assist        Toileting Toileting  Toileting assist     Transfers Chair/bed Clinical biochemist          Cognition Comprehension Comprehension assist level: Follows basic conversation/direction with extra time/assistive device  Expression can make Expression assist level: Expresses basic needs/ideas: With extra time/assistive device  Social Interaction    Problem Solving    Memory     Medical Problem List and Plan:  1. Functional and cognitive deficits secondary to anoxic brain injury and debility    -Beginning therapies today.  May need to consider 15 hours over 7-day intensity. 2. Right femoral vein/iliac veinDVT /Anticoagulation: Pharmaceutical: Coumadin with Heparin bridge  3. Pain Management: Tylenol. Limit neuro sedating medications due to arousal  4. Mood: Team to provide ego support and motivation  as possible. Social worker to screen.  5. Neuropsych: This patient is not fully capable of making decisions on his own behalf.  Normalize sleep-wake patterns  6. Skin/Wound Care: Routine pressure relief measures  7. Fluids/Electrolytes/Nutrition: Labs pending today. Continues on nectar liquids due to dysphagia.    -Encourage p.o. fluids 8. HTN: Monitor BP bid. On coreg, hydralazine and Isordil.  9. Acute on chronic combined CHF: Monitor weights daily. Strict I/O. No ACE/ARB due to CKD. Continue coreg, hydralazine, Isordil and pravastatin.     Danley Danker Weights   08/27/17 0020  Weight: 96 kg (211 lb 10.3 oz)    10. Acute on chronic renal failure: Continue to monitor renal status serially. BUN/SCR peaked at 137/4.4 and now trending down to 108/2.95.  Labs pending today 11. T2DM: Monitor BS ac/hs. On lantus 10 units bid with SSI for elevated BS.    -Follow for pattern.  Sugars have not been well controlled prior to admission to rehab 12. Leucocytosis: Monitor for signs of infection.  White count down to 8.9 today.  CBC personally reviewed   LOS (Days) 1 A FACE TO FACE EVALUATION WAS PERFORMED  Meredith Staggers, MD 08/27/2017 9:08 AM

## 2017-08-27 NOTE — Evaluation (Signed)
Occupational Therapy Assessment and Plan  Patient Details  Name: Belen Zwahlen MRN: [id number] Date of Birth: 1931-08-15  OT Diagnosis: abnormal posture, cognitive deficits, muscle weakness (generalized) and L inattention, coordination disorder Rehab Potential: Rehab Potential (ACUTE ONLY): Fair ELOS: 24-28 days   Today's Date: 08/27/2017 OT Individual Time: 1430-1455 OT Individual Time Calculation (min): 25 min     Problem List:  Patient Active Problem List   Diagnosis Date Noted  . Anoxic encephalopathy ([place]) 08/22/2017  . Oropharyngeal dysphagia   . Diabetes mellitus type 2 in obese ([place])   . Pressure injury of skin 08/22/2017  . Palliative care by specialist   . DNR (do not resuscitate)   . Weakness generalized   . Respiratory failure ([place])   . Acute renal failure superimposed on stage 3 chronic kidney disease ([place]) 08/18/2017  . Acute pulmonary edema (HCC)   . Cardiac arrest ([place]) 08/11/2017  . Acute hypercapnic respiratory failure ([place]) 08/11/2017  . Uncontrolled hypertension 08/11/2017  . Acute on chronic combined systolic and diastolic CHF, NYHA class 3 ([place])   . Hypertensive retinopathy of left eye, grade 1 03/25/2017  . Controlled diabetes mellitus type 2 with complications ([place]) 46/27/0350  . Chronic combined systolic and diastolic CHF (congestive heart failure) ([place]) 11/06/2016  . Squamous cell carcinoma of skin 02/20/2016  . Chronic acquired lymphedema 08/02/2015  . Mixed incontinence 03/23/2015  . Benign prostatic hyperplasia with weak urinary stream 10/13/2014  . Hyperlipidemia associated with type 2 diabetes mellitus ([place]) 05/25/2013  . Chronic kidney disease, stage III (moderate) ([place]) 05/25/2013  . Diabetes mellitus due to underlying condition with diabetic nephropathy, without long-term current use of insulin ([place]) 10/01/2011  . Xerostomia 08/28/2011  . Hypertensive heart disease with CHF (congestive heart failure) ([place]) 08/28/2011  . Obesity (BMI  30-39.9) 05/30/2011  . Pacemaker-Medtronic 11/21/2010  . DEPRESSION/ANXIETY 08/29/2009  . Obstructive sleep apnea 07/21/2009    Past Medical History:  Past Medical History:  Diagnosis Date  . Basal cell carcinoma    "scalp, arms, right face;  some cut, some burndt off"  . CAD (coronary artery disease)    LHC 3/14: Distal left main 10%, LAD 20%, circumflex 10-20%, proximal RCA 20%, distal 40-50%, EF 50-55%.  . Chronic combined systolic and diastolic CHF, NYHA class 4 ([place]) 11/06/2016   Echo 4/18: severe LVH, EF 45-50, global HK, Gr 1 DD, mild AS (mean 9/peak 17), MAC, mod LAE, trivial eff  . Complete heart block (HCC)    Status post Medtronic pacemaker  . High cholesterol   . HTN (hypertension)   . Hx of cardiovascular stress test    a. [place] MV 3/14:  Low risk, EF 36%, apical inf scar, no ischemia, inf-apical HK>> catheterization however demonstrated normal left ventricular function  . Melanoma ([place])    back  . Obesity   . Pneumonia 1940  . Presence of permanent cardiac pacemaker    Medtronic  . Type II diabetes mellitus ([place])   . Urinary frequency   . Urinary incontinence    Past Surgical History:  Past Surgical History:  Procedure Laterality Date  . APPENDECTOMY  40 years ago  . BASAL CELL CARCINOMA EXCISION     "scalp, right face"  . INSERT / REPLACE / REMOVE PACEMAKER  ~ 1999   medtronic EnRhythm  . MOHS SURGERY Left    ear; being monitored-every 6 months  . PERMANENT PACEMAKER GENERATOR CHANGE N/A 02/12/2013   Procedure: PERMANENT PACEMAKER GENERATOR CHANGE;  Surgeon: Deboraha Sprang, MD;  Location: [facility] CATH LAB;  Service: Cardiovascular;  Laterality: N/A;  . TONSILLECTOMY     as child  . TRANSURETHRAL RESECTION OF PROSTATE N/A 10/13/2014   Procedure: WOLF TRANSURETHRAL RESECTION OF THE PROSTATE (TURP);  Surgeon: Carolan Clines, MD;  Location: [facility];  Service: Urology;  Laterality: N/A;    Assessment & Plan Clinical Impression: Patient is a 84 y.o. year old male  with history of HTN, DMT2, chronic combined CHF, NICM s/p PPM, recent URI symptoms; who was admitted on 08/11/2017 after cardiac arrest. EMS noted faint pulse with VF as initial rhythm. He required 37 Minutes CPR with ACLS protocol and pulse regained on arrival to ED. He was responsive and intubated at admission. Not felt to be a candidate for cardiac cath and treated with cooling. 2D echo showed diffuse hypokinesis, severe calcification on MV with mild MVR and severe LVH and EF 25-30 %. CT head showed global atrophy, no hemorrhage and poor definition of anterolateral thalamus and lenticular nucleus question due to changes of hypoxia. Blood cultures X 2 negative. He was treated with diuresis due to fluid overload and tolerated extubation without difficulty on 01/14.  He was found to have DVT Right CFV to distal iliac vein and started on IV heparin bridge to coumadin. Cardiac arrest felt to be due to respiratory event leading cardiopulmonary arrest and question plans for ICD once stable. He was placed on dysphagia 2 diet with nectar liquids due to dysphagia. CHF compensated and AKI resolving. Repeat cardiac echo 1/20 revealed EF improved to 40-45% and no plans for ischemic work up. Has mild leucocytosis without evidence of infection. Confusion and lethargy is resolving but patient noted to be debilitated with evidence of anoxic encephalopathy. CIR recommended due to functional deficits. Patient transferred to CIR on 08/31/2017 .    Patient currently requires max with basic self-care skills secondary to muscle weakness, decreased cardiorespiratoy endurance, decreased coordination and decreased motor planning, L inattention, decreased initiation, decreased attention, decreased awareness, decreased problem solving, decreased safety awareness, decreased memory and delayed processing and decreased sitting balance, decreased standing balance and decreased balance strategies.  Prior to hospitalization, patient could  complete ADLs with modified independent .  Patient will benefit from skilled intervention to decrease level of assist with basic self-care skills prior to discharge SNF if caregiver unable to provide 24/7 supervision and min A.  Anticipate patient will require minimal physical assistance and SNF.  OT - End of Session Activity Tolerance: Decreased this session Endurance Deficit: Yes Endurance Deficit Description: 2/2 fatigue, decreased cardiopulmonary support OT Assessment Rehab Potential (ACUTE ONLY): Fair OT Barriers to Discharge: Decreased caregiver support;Home environment access/layout OT Barriers to Discharge Comments: wife may not be able to provide needed assist at d/c OT Patient demonstrates impairments in the following area(s): Balance;Safety;Behavior;Cognition;Vision;Endurance;Motor;Pain OT Basic ADL's Functional Problem(s): Eating;Grooming;Bathing;Dressing;Toileting OT Transfers Functional Problem(s): Toilet;Tub/Shower OT Additional Impairment(s): None OT Plan OT Intensity: Minimum of 1-2 x/day, 45 to 90 minutes OT Frequency: 5 out of 7 days OT Duration/Estimated Length of Stay: 24-28 days OT Treatment/Interventions: Balance/vestibular training;Discharge planning;Pain management;Self Care/advanced ADL retraining;Therapeutic Activities;UE/LE Coordination activities;Cognitive remediation/compensation;Disease mangement/prevention;Functional mobility training;Patient/family education;Therapeutic Exercise;Visual/perceptual remediation/compensation;Community reintegration;DME/adaptive equipment instruction;Neuromuscular re-education;Psychosocial support;UE/LE Strength taining/ROM;Wheelchair propulsion/positioning OT Self Feeding Anticipated Outcome(s): n/a OT Basic Self-Care Anticipated Outcome(s): min A OT Toileting Anticipated Outcome(s): min A OT Bathroom Transfers Anticipated Outcome(s): min A OT Recommendation Recommendations for Other Services: (none at this time) Patient  destination: [facility] (SNF) Follow Up Recommendations: Skilled nursing facility Equipment Recommended: To be determined  Skilled Therapeutic Intervention Upon entering the room, pt seated on EOB with breakfast tray in front of him. Pt with no c/o pain this session. Pt required set up A to open containers and chop up food. Pt required max multimodal cues for small sips and bites. Pt pocketing in L cheek and needed cues to clear mouth. Pt becoming very frustrated with cuing this session. OT educated pt on OT purpose, POC, and goals with pt verbalizing understanding. Pt holding cup in L hand while searching table and unable to find it. Vision to be tested further in functional context. Pt declined OOB activities and transfers this session. NT arrived to take over supervision this session with meal.   OT Evaluation Precautions/Restrictions  Precautions Precautions: Fall Restrictions Weight Bearing Restrictions: No Vital Signs Therapy Vitals Temp: 98 F (36.7 C) Temp Source: Oral Pulse Rate: 63 Resp: 16 BP: (!) 139/46 Patient Position (if appropriate): Sitting Oxygen Therapy SpO2: 96 % O2 Device: Nasal Cannula O2 Flow Rate (L/min): 2 L/min Pain Pain Assessment Pain Assessment: No/denies pain Home Living/Prior Functioning Home Living Family/patient expects to be discharged to:: Private residence(per pt statement) Living Arrangements: Spouse/significant other Available Help at Discharge: Family Type of Home: House Home Access: Stairs to enter Technical brewer of Steps: 2 Entrance Stairs-Rails: None Home Layout: One level Bathroom Shower/Tub: Tub/shower unit, Architectural technologist: Programmer, systems: Yes  Lives With: Spouse Prior Function Level of Independence: Independent with basic ADLs, Independent with transfers, Requires assistive device for independence  Able to Take Stairs?: Yes Driving: Yes Vision Baseline Vision/History: Wears  glasses Wears Glasses: At all times Patient Visual Report: No change from baseline Vision Assessment?: Vision impaired- to be further tested in functional context Additional Comments: ,L inattention Cognition Overall Cognitive Status: Impaired/Different from baseline Arousal/Alertness: Lethargic Orientation Level: Person;Place Year: 2018 Month: January Day of Week: Incorrect Memory: Impaired Memory Impairment: Decreased recall of new information;Decreased short term memory Immediate Memory Recall: Sock;Blue;Bed Memory Recall: (0/3) Attention: Sustained Sustained Attention: Impaired Sustained Attention Impairment: Functional basic Awareness: Impaired Awareness Impairment: Intellectual impairment Problem Solving: Impaired Problem Solving Impairment: Functional basic Executive Function: (all impaired due to lower level deficits ) Behaviors: Poor frustration tolerance Safety/Judgment: Impaired Sensation Sensation Light Touch: Appears Intact Stereognosis: Not tested Hot/Cold: Not tested Proprioception: Appears Intact Coordination Gross Motor Movements are Fluid and Coordinated: No Fine Motor Movements are Fluid and Coordinated: No Motor  Motor Motor - Skilled Clinical Observations: general weakness, poor endurance Mobility  Bed Mobility Bed Mobility: Supine to Sit Supine to Sit: 5: Supervision;With rails;HOB elevated Transfers Sit to Stand: 2: Max assist;With armrests Sit to Stand Details: Tactile cues for initiation;Tactile cues for weight shifting;Tactile cues for sequencing;Tactile cues for posture;Manual facilitation for weight shifting Stand to Sit: 2: Max assist Stand to Sit Details (indicate cue type and reason): Manual facilitation for placement;Tactile cues for placement  Trunk/Postural Assessment  Cervical Assessment Cervical Assessment: Exceptions to WFL(forward head) Thoracic Assessment Thoracic Assessment: Exceptions to WFL(kyphotic) Lumbar  Assessment Lumbar Assessment: Exceptions to WFL(posterior pelvic tilt) Postural Control Postural Control: Deficits on evaluation  Balance Balance Balance Assessed: Yes Static Sitting Balance Static Sitting - Balance Support: Feet supported Static Sitting - Level of Assistance: 5: Stand by assistance Static Standing Balance Static Standing - Balance Support: Bilateral upper extremity supported Static Standing - Level of Assistance: 2: Max assist Extremity/Trunk Assessment RUE Assessment RUE Assessment: Exceptions to WFL(3-/5 throughout) LUE Assessment LUE Assessment: Exceptions to WFL(3-/5 throughout)   See Function Navigator for Current Functional Status.  Refer to Care Plan for Long Term Goals  Recommendations for other services: None    Discharge Criteria: Patient will be discharged from OT if patient refuses treatment 3 consecutive times without medical reason, if treatment goals not met, if there is a change in medical status, if patient makes no progress towards goals or if patient is discharged from hospital.  The above assessment, treatment plan, treatment alternatives and goals were discussed and mutually agreed upon: by patient  Gypsy Decant 08/27/2017, 4:33 PM

## 2017-08-27 NOTE — Evaluation (Signed)
Speech Language Pathology Assessment and Plan  Patient Details  Name: Malik Rojas MRN: [id number] Date of Birth: 03-13-1932  SLP Diagnosis: Dysphagia;Cognitive Impairments  Rehab Potential: Good ELOS: 3-4 weeks    Today's Date: 08/27/2017 SLP Individual Time: 4098-1191 SLP Individual Time Calculation (min): 60 min   Problem List:  Patient Active Problem List   Diagnosis Date Noted  . Anoxic encephalopathy ([place]) 08/28/2017  . Oropharyngeal dysphagia   . Diabetes mellitus type 2 in obese ([place])   . Pressure injury of skin 08/22/2017  . Palliative care by specialist   . DNR (do not resuscitate)   . Weakness generalized   . Respiratory failure ([place])   . Acute renal failure superimposed on stage 3 chronic kidney disease ([place]) 08/18/2017  . Acute pulmonary edema (HCC)   . Cardiac arrest ([place]) 08/11/2017  . Acute hypercapnic respiratory failure ([place]) 08/11/2017  . Uncontrolled hypertension 08/11/2017  . Acute on chronic combined systolic and diastolic CHF, NYHA class 3 ([place])   . Hypertensive retinopathy of left eye, grade 1 03/25/2017  . Controlled diabetes mellitus type 2 with complications ([place]) 47/82/9562  . Chronic combined systolic and diastolic CHF (congestive heart failure) ([place]) 11/06/2016  . Squamous cell carcinoma of skin 02/20/2016  . Chronic acquired lymphedema 08/02/2015  . Mixed incontinence 03/23/2015  . Benign prostatic hyperplasia with weak urinary stream 10/13/2014  . Hyperlipidemia associated with type 2 diabetes mellitus ([place]) 05/25/2013  . Chronic kidney disease, stage III (moderate) ([place]) 05/25/2013  . Diabetes mellitus due to underlying condition with diabetic nephropathy, without long-term current use of insulin ([place]) 10/01/2011  . Xerostomia 08/28/2011  . Hypertensive heart disease with CHF (congestive heart failure) ([place]) 08/28/2011  . Obesity (BMI 30-39.9) 05/30/2011  . Pacemaker-Medtronic 11/21/2010  . DEPRESSION/ANXIETY 08/29/2009  . Obstructive  sleep apnea 07/21/2009   Past Medical History:  Past Medical History:  Diagnosis Date  . Basal cell carcinoma    "scalp, arms, right face;  some cut, some burndt off"  . CAD (coronary artery disease)    LHC 3/14: Distal left main 10%, LAD 20%, circumflex 10-20%, proximal RCA 20%, distal 40-50%, EF 50-55%.  . Chronic combined systolic and diastolic CHF, NYHA class 4 ([place]) 11/06/2016   Echo 4/18: severe LVH, EF 45-50, global HK, Gr 1 DD, mild AS (mean 9/peak 17), MAC, mod LAE, trivial eff  . Complete heart block (HCC)    Status post Medtronic pacemaker  . High cholesterol   . HTN (hypertension)   . Hx of cardiovascular stress test    a. [place] MV 3/14:  Low risk, EF 36%, apical inf scar, no ischemia, inf-apical HK>> catheterization however demonstrated normal left ventricular function  . Melanoma ([place])    back  . Obesity   . Pneumonia 1940  . Presence of permanent cardiac pacemaker    Medtronic  . Type II diabetes mellitus ([place])   . Urinary frequency   . Urinary incontinence    Past Surgical History:  Past Surgical History:  Procedure Laterality Date  . APPENDECTOMY  40 years ago  . BASAL CELL CARCINOMA EXCISION     "scalp, right face"  . INSERT / REPLACE / REMOVE PACEMAKER  ~ 1999   medtronic EnRhythm  . MOHS SURGERY Left    ear; being monitored-every 6 months  . PERMANENT PACEMAKER GENERATOR CHANGE N/A 02/12/2013   Procedure: PERMANENT PACEMAKER GENERATOR CHANGE;  Surgeon: Deboraha Sprang, MD;  Location: [facility] CATH LAB;  Service: Cardiovascular;  Laterality: N/A;  . TONSILLECTOMY  as child  . TRANSURETHRAL RESECTION OF PROSTATE N/A 10/13/2014   Procedure: WOLF TRANSURETHRAL RESECTION OF THE PROSTATE (TURP);  Surgeon: Carolan Clines, MD;  Location: [facility] ORS;  Service: Urology;  Laterality: N/A;    Assessment / Plan / Recommendation Clinical Impression Patient is a 84 y.o. year old male with history of HTN, DMT2, chronic combined CHF, NICM s/p PPM, recent URI symptoms; who  was admitted on 08/11/2017 after cardiac arrest. EMS noted faint pulse with VF as initial rhythm. He required 37 Minutes CPR with ACLS protocol and pulse regained on arrival to ED. He was responsive and intubated at admission. Not felt to be a candidate for cardiac cath and treated with cooling. 2D echo showed diffuse hypokinesis, severe calcification on MV with mild MVR and severe LVH and EF 25-30 %. CT head showed global atrophy, no hemorrhage and poor definition of anterolateral thalamus and lenticular nucleus question due to changes of hypoxia. Blood cultures X 2 negative. He was treated with diuresis due to fluid overload and tolerated extubation without difficulty on 01/14. He was found to have DVT Right CFV to distal iliac vein and started on IV heparin bridge to coumadin. Cardiac arrest felt to be due to respiratory event leading cardiopulmonary arrest and question plans for ICD once stable. He was placed on dysphagia 2 diet with nectar liquids due to dysphagia. CHF compensated and AKI resolving. Repeat cardiac echo 1/20 revealed EF improved to 40-45% and no plans for ischemic work up. Has mild leucocytosis without evidence of infection. Confusion and lethargy is resolving but patient noted to be debilitated with evidence of anoxic encephalopathy. CIR recommended due to functional deficits.  Patient transferred to CIR on 09/01/2017 .   Patient demonstrates moderate-severe cognitive impairments impacting arousal, attention, orientation, awareness, problem solving and recall which impacts his safety with functional and familiar tasks. Patient's function is also impacted by lethargy and decreased frustration tolerance. Patient consumed nectar-thick liquids via cup without overt s/s of aspiration but demonstrated multiple swallows and what appeared to be a delayed swallow initiation.  Thin liquids were not administered due to lethargy. Patient consumed Dys. 2 textures with prolonged mastication, more than a  reasonable amount of time and oral residue that cleared with liquid washes and moderate verbal cues. SLP provided education to the patient's family on the benefit of Dys. 1 textures to maximize his safety and energy conservation, however, patient's family adamant in regards to patient remaining on Dys. 2 textures. Recommend patient continue current diet. Patient would benefit from skilled SLP intervention to maximize his cognitive and swallowing function and overall functional independence prior to discharge.    Skilled Therapeutic Interventions          Administered a BSE and cognitive-linguistic evaluation. Please see above for details. Educated patient and family on current cognitive and swallowing impairments and goals of skilled SLP intervention. All verbalized understanding and agreement.    SLP Assessment  Patient will need skilled Speech Lanaguage Pathology Services during CIR admission    Recommendations  SLP Diet Recommendations: Dysphagia 2 (Fine chop);Nectar Liquid Administration via: Cup;No straw Medication Administration: Crushed with puree Supervision: Patient able to self feed;Full supervision/cueing for compensatory strategies Compensations: Slow rate;Small sips/bites;Lingual sweep for clearance of pocketing;Minimize environmental distractions Postural Changes and/or Swallow Maneuvers: Seated upright 90 degrees Oral Care Recommendations: Oral care BID Recommendations for Other Services: Neuropsych consult Patient destination: Home Follow up Recommendations: Home Health SLP;24 hour supervision/assistance;Outpatient SLP Equipment Recommended: To be determined    SLP Frequency 3  to 5 out of 7 days   SLP Duration  SLP Intensity  SLP Treatment/Interventions 3-4 weeks  Minumum of 1-2 x/day, 30 to 90 minutes  Cognitive remediation/compensation;Environmental controls;Internal/external aids;Therapeutic Activities;Patient/family education;Dysphagia/aspiration precaution  training;Cueing hierarchy;Functional tasks    Pain Pain Assessment Pain Assessment: No/denies pain  Prior Functioning Type of Home: House  Lives With: Spouse Available Help at Discharge: Family(cannot provide physical assistance upon d/c)  Function:  Eating Eating   Modified Consistency Diet: Yes Eating Assist Level: Supervision or verbal cues;Set up assist for;More than reasonable amount of time   Eating Set Up Assist For: Opening containers       Cognition Comprehension Comprehension assist level: Understands basic 75 - 89% of the time/ requires cueing 10 - 24% of the time  Expression   Expression assist level: Expresses basic 75 - 89% of the time/requires cueing 10 - 24% of the time. Needs helper to occlude trach/needs to repeat words.  Social Interaction Social Interaction assist level: Interacts appropriately 50 - 74% of the time - May be physically or verbally inappropriate.  Problem Solving Problem solving assist level: Solves basic 25 - 49% of the time - needs direction more than half the time to initiate, plan or complete simple activities  Memory Memory assist level: Recognizes or recalls 25 - 49% of the time/requires cueing 50 - 75% of the time     Refer to Care Plan for Long Term Goals  Recommendations for other services: Neuropsych  Discharge Criteria: Patient will be discharged from SLP if patient refuses treatment 3 consecutive times without medical reason, if treatment goals not met, if there is a change in medical status, if patient makes no progress towards goals or if patient is discharged from hospital.  The above assessment, treatment plan, treatment alternatives and goals were discussed and mutually agreed upon: by patient and by family  Jenefer Woerner 08/27/2017, 3:36 PM

## 2017-08-27 NOTE — Progress Notes (Signed)
ANTICOAGULATION CONSULT NOTE   Pharmacy Consult for Coumadin  Indication: DVT  No Known Allergies  Patient Measurements: Weight: 211 lb 10.3 oz (96 kg)  HDWt: 92.1 kg  Vital Signs: BP: 137/45 (01/23 0817)  Labs: Recent Labs    08/25/17 0408 08/25/17 0658 08/16/2017 0447 08/27/17 0805  HGB 13.4  --   --  13.4  HCT 42.0  --   --  41.7  PLT 271  --   --  231  LABPROT  --  21.0* 22.6* 24.8*  INR  --  1.83 2.01 2.26  HEPARINUNFRC  --  0.59 <0.10*  --   CREATININE 3.30*  --  2.95* 2.99*    Estimated Creatinine Clearance: 21.8 mL/min (A) (by C-G formula based on SCr of 2.99 mg/dL (H)).   Assessment: 84 year old male s/p prolonged CPR with CHF, respiratory failure, AKI, now s/p heparin, continuing coumadin for new DVT, completed 5 days of overlap. Cbc stable. PO intake increased the last two days. No bleeding noted  INR = 2.26  Goal of Therapy:  INR 2-3 Monitor platelets by anticoagulation protocol: Yes   Plan:  Give warfarin 5 mg po x 1 Monitor daily INR, CBC, clinical course, s/sx of bleed, PO intake, DDI   Thank you for allowing [place] to participate in this patients care.  Jens Som, PharmD Clinical phone for 08/27/2017 from 7a-3:30p: [phone] If after 3:30p, please call main pharmacy at: [phone] 08/27/2017 1:03 PM

## 2017-08-28 ENCOUNTER — Inpatient Hospital Stay (HOSPITAL_COMMUNITY): Payer: Medicare Other | Admitting: Speech Pathology

## 2017-08-28 ENCOUNTER — Inpatient Hospital Stay (HOSPITAL_COMMUNITY): Payer: Medicare Other | Admitting: Physical Therapy

## 2017-08-28 ENCOUNTER — Inpatient Hospital Stay (HOSPITAL_COMMUNITY): Payer: Medicare Other | Admitting: Occupational Therapy

## 2017-08-28 ENCOUNTER — Inpatient Hospital Stay (HOSPITAL_COMMUNITY): Payer: Medicare Other | Admitting: *Deleted

## 2017-08-28 DIAGNOSIS — N183 Chronic kidney disease, stage 3 (moderate): Secondary | ICD-10-CM

## 2017-08-28 DIAGNOSIS — I5043 Acute on chronic combined systolic (congestive) and diastolic (congestive) heart failure: Secondary | ICD-10-CM

## 2017-08-28 LAB — GLUCOSE, CAPILLARY
GLUCOSE-CAPILLARY: 179 mg/dL — AB (ref 65–99)
GLUCOSE-CAPILLARY: 164 mg/dL — AB (ref 65–99)
Glucose-Capillary: 174 mg/dL — ABNORMAL HIGH (ref 65–99)
Glucose-Capillary: 185 mg/dL — ABNORMAL HIGH (ref 65–99)

## 2017-08-28 LAB — PROTIME-INR
INR: 2.39
PROTHROMBIN TIME: 25.9 s — AB (ref 11.4–15.2)

## 2017-08-28 MED ORDER — WARFARIN - PHARMACIST DOSING INPATIENT
Freq: Every day | Status: DC
  Administered 2017-08-28 – 2017-08-30 (×3)

## 2017-08-28 MED ORDER — PANTOPRAZOLE SODIUM 40 MG PO PACK
40.0000 mg | PACK | Freq: Every day | ORAL | Status: DC
  Administered 2017-08-28 – 2017-09-01 (×5): 40 mg via ORAL
  Filled 2017-08-28 (×5): qty 20

## 2017-08-28 MED ORDER — WARFARIN SODIUM 6 MG PO TABS
6.0000 mg | ORAL_TABLET | Freq: Once | ORAL | Status: AC
  Administered 2017-08-28: 6 mg via ORAL
  Filled 2017-08-28: qty 1

## 2017-08-28 NOTE — Progress Notes (Signed)
Speech Language Pathology Daily Session Note  Patient Details  Name: Malik Rojas MRN: [id number] Date of Birth: [birth year]  Today's Date: 08/28/2017 SLP Individual Time: 1545-1600 SLP Individual Time Calculation (min): 15 min  Short Term Goals: Week 1: SLP Short Term Goal 1 (Week 1): Patient will consume current diet with minimal overt s/s of aspiration and Min A verbal cues for use of swallowing compensatory strategies.  SLP Short Term Goal 2 (Week 1): Patient will consume trials of thin liquids with minimal overt s/s of aspiration over 2 sessions with Min A verbal cues to assess readiness for upgrade vs repeat MBS SLP Short Term Goal 3 (Week 1): Patient will demonstrate sustained attention to a functional task for ~5 minutes with Min A verbal cues for redirection.  SLP Short Term Goal 4 (Week 1): Patient will identify 1 physical and 1 cognitive deficit with Max A multimodal cues.  SLP Short Term Goal 5 (Week 1): Patient will demonstrate functional problem solving for basic and familiar tasks with Mod A verbal cues.  SLP Short Term Goal 6 (Week 1): Patient will utilize external memory aids to maximize orientation with Mod A verbal and visual cues.   Skilled Therapeutic Interventions: Skilled treatment session focused on cognition goals. SLP received pt asleep in wheelchair wrapped in blankets following recent shower with OT. Pt was not able to arouse despite Total A multimodal cues from SLP and wife. Pt intermittently opened eyes but unable to arouse to level of safety with trials of PO intake. Pt was left upright in wheelchair with wife present. Continue per current plan of care.      Function:    Cognition Comprehension Comprehension assist level: Understands basic 75 - 89% of the time/ requires cueing 10 - 24% of the time  Expression   Expression assist level: Expresses basic 25 - 49% of the time/requires cueing 50 - 75% of the time. Uses single words/gestures.  Social Interaction  Social Interaction assist level: Interacts appropriately 50 - 74% of the time - May be physically or verbally inappropriate.  Problem Solving Problem solving assist level: Solves basic 25 - 49% of the time - needs direction more than half the time to initiate, plan or complete simple activities  Memory Memory assist level: Recognizes or recalls 25 - 49% of the time/requires cueing 50 - 75% of the time    Pain Pain Assessment Pain Assessment: No/denies pain  Therapy/Group: Individual Therapy  Malik Rojas 08/28/2017, 4:05 PM

## 2017-08-28 NOTE — Progress Notes (Signed)
ANTICOAGULATION CONSULT NOTE   Pharmacy Consult for Coumadin  Indication: DVT  No Known Allergies  Patient Measurements: Weight: 212 lb 1.8 oz (96.2 kg)  HDWt: 92.1 kg  Vital Signs: Temp: 98.9 F (37.2 C) (01/24 0009) Temp Source: Oral (01/24 0009) BP: 124/55 (01/24 0635) Pulse Rate: 73 (01/24 0009)  Labs: Recent Labs    09/01/2017 0447 08/27/17 0805 08/28/17 0616  HGB  --  13.4  --   HCT  --  41.7  --   PLT  --  231  --   LABPROT 22.6* 24.8* 25.9*  INR 2.01 2.26 2.39  HEPARINUNFRC <0.10*  --   --   CREATININE 2.95* 2.99*  --     Estimated Creatinine Clearance: 21.8 mL/min (A) (by C-G formula based on SCr of 2.99 mg/dL (H)).   Assessment: 84 year old male s/p prolonged CPR with CHF, respiratory failure, AKI, now s/p heparin, continuing coumadin for new DVT, completed 5 days of overlap. Cbc stable. PO intake increased the last two days. No bleeding noted  INR remains therapeutic at 2.39  Goal of Therapy:  INR 2-3 Monitor platelets by anticoagulation protocol: Yes   Plan:  Give warfarin 6 mg po x 1 Monitor daily INR, CBC, clinical course, s/sx of bleed, PO intake, DDI   Thank you for allowing [place] to participate in this patients care.  Jens Som, PharmD Clinical phone for 08/28/2017 from 7a-3:30p: [phone] If after 3:30p, please call main pharmacy at: [phone] 08/28/2017 11:50 AM

## 2017-08-28 NOTE — Progress Notes (Signed)
Occupational Therapy Session Note  Patient Details  Name: Rilyn Upshaw MRN: [id number] Date of Birth: 03/31/1932  Today's Date: 08/28/2017 OT Individual Time: 1300-1330 OT Individual Time Calculation (min): 30 min    Short Term Goals: Week 1:  OT Short Term Goal 1 (Week 1): Pt will perform toilet transfer with max A. OT Short Term Goal 2 (Week 1): Pt will demonstrate mod A standing balance during LB clothing management.  OT Short Term Goal 3 (Week 1): Pt will perform UB dressing with mod A in order to decrease level of assistance with self care.  Skilled Therapeutic Interventions/Progress Updates:    1:1 Therapeutic activity: focus on sit to stand and standing balance/ endurance during tasks. Pt able to perform sit to stands with mod A with extra time. Pt able to sustain standing with mod to max cues to maintain upright standing posture while performing a rote task and sorting out of field of 2 maintain sustained attention. Pt oriented to name and birthday but not date.   Therapy Documentation Precautions:  Precautions Precautions: Fall Restrictions Weight Bearing Restrictions: No General:   Vital Signs:  Pain: Pain Assessment Pain Assessment: No/denies pain  See Function Navigator for Current Functional Status.   Therapy/Group: Individual Therapy  Willeen Cass [facility] 08/28/2017, 2:47 PM

## 2017-08-28 NOTE — Progress Notes (Signed)
Occupational Therapy Session Note  Patient Details  Name: Malik Rojas MRN: [id number] Date of Birth: 01-25-1932  Today's Date: 08/28/2017 OT Individual Time: 8416-6063 OT Individual Time Calculation (min): 74 min    Short Term Goals: Week 1:  OT Short Term Goal 1 (Week 1): Pt will perform toilet transfer with max A. OT Short Term Goal 2 (Week 1): Pt will demonstrate mod A standing balance during LB clothing management.  OT Short Term Goal 3 (Week 1): Pt will perform UB dressing with mod A in order to decrease level of assistance with self care.  Skilled Therapeutic Interventions/Progress Updates:    Upon entering the room, pt seated in wheelchair with wife present in room. Pt agreeable to OT intervention with encouragement from therapist and wife. OT propelled pt into bathroom via wheelchair and pt transfer with increased time and max multimodal cues for problem solving and sequencing transfer from wheelchair > TTB. Pt perseverating on washing face and required hand over hand assistance and max cues to redirect. Pt transferred back to wheelchair with max A and pulling up on grab bar. Pt standing with max A from wheelchair for therapist to don brief. Pt donning hospital gown with set up A. OT educating caregiver on pt's participation and problem solving during this session. Education to continue. Pt remained seated in wheelchair with quick release belt donned and on 2 L O@ via [place].  Therapy Documentation Precautions:  Precautions Precautions: Fall Restrictions Weight Bearing Restrictions: No General:   Vital Signs:  Pain: Pain Assessment Pain Assessment: No/denies pain  See Function Navigator for Current Functional Status.   Therapy/Group: Individual Therapy  Gypsy Decant 08/28/2017, 4:18 PM

## 2017-08-28 NOTE — Progress Notes (Signed)
Physical Therapy Session Note  Patient Details  Name: Malik Rojas MRN: [id number] Date of Birth: 1932/06/29  Today's Date: 08/28/2017 PT Individual Time: 1107-1204 PT Individual Time Calculation (min): 57 min   Short Term Goals: Week 1:  PT Short Term Goal 1 (Week 1): Pt will initiate stair negotiation. PT Short Term Goal 2 (Week 1): Pt will complete car transfer with mod assist. PT Short Term Goal 3 (Week 1): Pt will ambulate 50 ft with LRAD & mod assist.  Skilled Therapeutic Interventions/Progress Updates:  Pt received asleep in bed with wife Henrine Screws) present for session. Session focused on pt/family education, bed mobility, tranfers, w/c mobility, activity tolerance, and self care. Pt's wife very concerned regarding pt's lethargy with therapist providing education & communicating her concerns to PA. Pt required max assist for rolling L<>R in bed with bed rails as pt unable to initiate or carry out movement to allow therapist to don pants total assist. Pt transferred sidelying to sitting EOB with mod assist and HOB significantly elevated as pt unable to push to come to upright position. While sitting EOB pt required max assist to don shirt. Pt transferred bed>w/c via stand pivot with max multimodal cuing for sequencing, hand placement, technique and min assist for balance. Pt set up at sink total assist and required max cuing to wash hands at sink (therapist provided total assist for managing water, providing cuing for soap, assistance with anterior weight shifting). Pt propelled w/c ~50 ft down the hall with BUE & min assist for linear trajectory; pt required frequent rest breaks. Therapist educated pt & wife on impaired cognition (memory, attention, processing), current level of function, and purpose of therapy sessions. Pt requested something to drink and consumed thickened orange juice with max cuing for small sips. Educated wife on need for staff supervision for all meals/drinks. At end of  session pt left sitting in w/c in room with QRB donned, all needs within reach & wife present.   Therapy Documentation Precautions:  Precautions Precautions: Fall Restrictions Weight Bearing Restrictions: No  Vital Signs: At end of session: HR = 59-64 bpm, SpO2 on 2L/min = 97%  Pain: No c/o pain reported.   See Function Navigator for Current Functional Status.   Therapy/Group: Individual Therapy  Waunita Schooner 08/28/2017, 12:18 PM

## 2017-08-28 NOTE — Progress Notes (Signed)
Recreational Therapy Session Note  Patient Details  Name: Malik Rojas MRN: [id number] Date of Birth: August 27, 1931 Today's Date: 08/28/2017  Attempted TR eval, but pt falling asleep throughout session.  Eval deferred at this time.  Will check back in next week to determine appropriateness.  [place] 08/28/2017, 11:47 AM

## 2017-08-28 NOTE — Progress Notes (Signed)
[place] PHYSICAL MEDICINE & REHABILITATION     PROGRESS NOTE    Subjective/Complaints: No issues overnight.  Just waking up when I arrived.  Slow to engage this morning  ROS: Limited due to cognitive/behavioral   Objective: Vital Signs: Blood pressure (!) 124/55, pulse 73, temperature 98.9 F (37.2 C), temperature source Oral, resp. rate 16, weight 96.2 kg (212 lb 1.8 oz), SpO2 95 %. No results found. Recent Labs    08/27/17 0805  WBC 8.9  HGB 13.4  HCT 41.7  PLT 231   Recent Labs    08/30/2017 0447 08/27/17 0805  NA 143 144  K 4.7 5.2*  CL 106 104  GLUCOSE 166* 167*  BUN 108* 107*  CREATININE 2.95* 2.99*  CALCIUM 9.0 9.1   CBG (last 3)  Recent Labs    08/30/2017 2114 08/27/17 0630 08/27/17 1145  GLUCAP 145* 175* 152*    Wt Readings from Last 3 Encounters:  08/28/17 96.2 kg (212 lb 1.8 oz)  08/17/2017 96.3 kg (212 lb 4.9 oz)  07/22/17 105.2 kg (232 lb)    Physical Exam:  Gen: at eob. No distress.   HENT:  Head: Normocephalic and atraumatic.  Mouth/Throat: Oropharynx is clear and moist.  Eyes: Conjunctivae and EOM a about his his FMLA or whatever was I do not know I asked a question of return the message is what exactly her we giving question about need to have the information Hande if I call us so I have re normal. Pupils are equal, round, and reactive to light.  Neck: Normal range of motion. Neck supple.  Cardiovascular: RRR without murmur. No JVD  .  Respiratory: CTA Bilaterally without wheezes or rales. Normal effort  GI: Soft. Bowel sounds are normal. He exhibits no distension. There is no tenderness.  Musculoskeletal: He exhibits edema.  Edema RLE. Stasis changes BLE with flaky skin. Scabbed area on left shin with dry bloody drainage on dressing.  Neurological: Slow to arouse.  Moves all fours while in bed.  Follows simple commands.   Skin: Skin is warm and dry. He is not diaphoretic.  Psychiatric: His affect is blunt. His speech is delayed and  slurred. He is slowed. Cognition and memory are impaired.  Decreased insight and awareness    Assessment/Plan: 1.  Functional and cognitive deficits secondary to anoxic brain injury/debility which require 3+ hours per day of interdisciplinary therapy in a comprehensive inpatient rehab setting. Physiatrist is providing close team supervision and 24 hour management of active medical problems listed below. Physiatrist and rehab team continue to assess barriers to discharge/monitor patient progress toward functional and medical goals.  Function:  Bathing Bathing position      Bathing parts      Bathing assist        Upper Body Dressing/Undressing Upper body dressing   What is the patient wearing?: Hospital gown                Upper body assist Assist Level: Set up, Supervision or verbal cues   Set up : To obtain clothing/put away  Lower Body Dressing/Undressing Lower body dressing                                  Lower body assist        Toileting Toileting Toileting activity did not occur: No continent bowel/bladder event        Toileting assist  Transfers Chair/bed transfer   Chair/bed transfer method: Squat pivot Chair/bed transfer assist level: Moderate assist (Pt 50 - 74%/lift or lower) Chair/bed transfer assistive device: Armrests, Bedrails     Locomotion Ambulation     Max distance: 2 ft Assist level: 2 helpers(max assist + 2nd person to manage oxygen tank)   Wheelchair Wheelchair activity did not occur: Safety/medical concerns        Cognition Comprehension Comprehension assist level: Understands basic 75 - 89% of the time/ requires cueing 10 - 24% of the time  Expression can make Expression assist level: Expresses basic 75 - 89% of the time/requires cueing 10 - 24% of the time. Needs helper to occlude trach/needs to repeat words.  Social Interaction Social Interaction assist level: Interacts appropriately 50 - 74% of the time -  May be physically or verbally inappropriate.  Problem Solving Problem solving assist level: Solves basic 25 - 49% of the time - needs direction more than half the time to initiate, plan or complete simple activities  Memory Memory assist level: Recognizes or recalls 25 - 49% of the time/requires cueing 50 - 75% of the time   Medical Problem List and Plan:  1. Functional and cognitive deficits secondary to anoxic brain injury and debility    -Continue PT, OT, ST.  May need to consider 15 hours over 7-day intensity given current activity tolerance. 2. Right femoral vein/iliac veinDVT /Anticoagulation: Pharmaceutical: Coumadin.  INR therapeutic today 2.39 3. Pain Management: Tylenol. Limit neuro sedating medications due to arousal  4. Mood: Team to provide ego support and motivation as possible. Social worker to screen.  5. Neuropsych: This patient is not fully capable of making decisions on his own behalf.  Normalize sleep-wake patterns  6. Skin/Wound Care: Routine pressure relief measures  7. Fluids/Electrolytes/Nutrition: . Continues on nectar liquids due to dysphagia.    -Encourage p.o. Fluids.  Intake fair to inconsistent at times 8. HTN: Monitor BP bid. On coreg, hydralazine and Isordil.  9. Acute on chronic combined CHF: Monitor weights daily. Strict I/O. No ACE/ARB due to CKD. Continue coreg, hydralazine, Isordil and pravastatin.     -Weight stable at present [facility] Weights   08/27/17 0020 08/28/17 0009  Weight: 96 kg (211 lb 10.3 oz) 96.2 kg (212 lb 1.8 oz)    10. Acute on chronic renal failure: Continue to monitor renal status serially. BUN/SCR peaked at 137/4.4 and now trending down to 108/2.95.  Continue to monitor.   -hyperkalemia:  Follow-up labs/potassium in morning 11. T2DM: Monitor BS ac/hs. On lantus 10 units bid with SSI for elevated BS.    -Follow for pattern.  Sugars have not been well controlled prior to admission to rehab 12. Leucocytosis: Monitor for signs of infection.   White count down to 8.9 today.  CBC personally reviewed   LOS (Days) 2 A FACE TO FACE EVALUATION WAS PERFORMED  Meredith Staggers, MD 08/28/2017 8:44 AM

## 2017-08-29 ENCOUNTER — Inpatient Hospital Stay (HOSPITAL_COMMUNITY): Payer: Medicare Other | Admitting: Physical Therapy

## 2017-08-29 ENCOUNTER — Inpatient Hospital Stay (HOSPITAL_COMMUNITY): Payer: Medicare Other | Admitting: Occupational Therapy

## 2017-08-29 ENCOUNTER — Inpatient Hospital Stay (HOSPITAL_COMMUNITY): Payer: Medicare Other

## 2017-08-29 LAB — BASIC METABOLIC PANEL
Anion gap: 10 (ref 5–15)
BUN: 102 mg/dL — ABNORMAL HIGH (ref 6–20)
CO2: 29 mmol/L (ref 22–32)
CREATININE: 3.04 mg/dL — AB (ref 0.61–1.24)
Calcium: 9.1 mg/dL (ref 8.9–10.3)
Chloride: 104 mmol/L (ref 101–111)
GFR calc non Af Amer: 18 mL/min — ABNORMAL LOW (ref >60)
GFR, EST AFRICAN AMERICAN: 20 mL/min — AB (ref >60)
Glucose, Bld: 141 mg/dL — ABNORMAL HIGH (ref 65–99)
Potassium: 5.4 mmol/L — ABNORMAL HIGH (ref 3.5–5.1)
Sodium: 143 mmol/L (ref 135–145)

## 2017-08-29 LAB — PROTIME-INR
INR: 2.59
Prothrombin Time: 27.5 seconds — ABNORMAL HIGH (ref 11.4–15.2)

## 2017-08-29 LAB — GLUCOSE, CAPILLARY
GLUCOSE-CAPILLARY: 204 mg/dL — AB (ref 65–99)
GLUCOSE-CAPILLARY: 126 mg/dL — AB (ref 65–99)
GLUCOSE-CAPILLARY: 222 mg/dL — AB (ref 65–99)
Glucose-Capillary: 182 mg/dL — ABNORMAL HIGH (ref 65–99)

## 2017-08-29 MED ORDER — WARFARIN SODIUM 5 MG PO TABS
5.0000 mg | ORAL_TABLET | Freq: Once | ORAL | Status: AC
  Administered 2017-08-29: 5 mg via ORAL
  Filled 2017-08-29: qty 1

## 2017-08-29 MED ORDER — SODIUM POLYSTYRENE SULFONATE 15 GM/60ML PO SUSP: 15.0000 g | Freq: Once | ORAL | Status: DC

## 2017-08-29 MED ORDER — SODIUM POLYSTYRENE SULFONATE PO POWD
15.0000 g | Freq: Once | ORAL | Status: AC
  Administered 2017-08-29: 15 g via ORAL
  Filled 2017-08-29: qty 15

## 2017-08-29 NOTE — IPOC Note (Signed)
Overall Plan of Care [facility]) Patient Details Name: Macarius Ruark MRN: [id number] DOB: 01-12-1932  Admitting Diagnosis: Anoxic encephalopathy [facility])  Hospital Problems: Principal Problem:   Anoxic encephalopathy ([place]) Active Problems:   Chronic kidney disease, stage III (moderate) (HCC)   Acute on chronic combined systolic and diastolic CHF, NYHA class 3 (HCC)   Oropharyngeal dysphagia   Diabetes mellitus type 2 in obese [facility])     Functional Problem List: Nursing Bladder, Bowel, Skin Integrity, Edema, Medication Management, Motor, Safety  PT Balance, Safety, Behavior, Edema, Skin Integrity, Endurance, Motor, Perception, Pain  OT Balance, Safety, Behavior, Cognition, Vision, Endurance, Motor, Pain  SLP Behavior, Cognition, Nutrition  TR         Basic ADL's: OT Eating, Grooming, Bathing, Dressing, Toileting     Advanced  ADL's: OT       Transfers: PT Bed Mobility, Car, Bed to Chair, Manufacturing systems engineer, Metallurgist: PT Emergency planning/management officer, Ambulation, Stairs     Additional Impairments: OT None  SLP Swallowing, Social Cognition   Social Interaction, Problem Solving, Memory, Attention, Awareness  TR      Anticipated Outcomes Item Anticipated Outcome  Self Feeding n/a  Swallowing  Supervision   Basic self-care  min A  Toileting  min A   Bathroom Transfers min A  Bowel/Bladder  continent of B/B with mod assist toileting Q2-3 hrs  Transfers  min assist overall  Locomotion  min assist overall with LRAD  Communication     Cognition  Supervision   Pain  <3 out of 10; prn pain medication able to decrease/resolve pain  Safety/Judgment  free from fall and/or injury; use of call light for assistance   Therapy Plan: PT Intensity: Minimum of 1-2 x/day ,45 to 90 minutes PT Frequency: 5 out of 7 days PT Duration Estimated Length of Stay: 3-4 weeks OT Intensity: Minimum of 1-2 x/day, 45 to 90 minutes OT Frequency: 5 out of 7 days OT  Duration/Estimated Length of Stay: 24-28 days SLP Intensity: Minumum of 1-2 x/day, 30 to 90 minutes SLP Frequency: 3 to 5 out of 7 days SLP Duration/Estimated Length of Stay: 3-4 weeks    Team Interventions: Nursing Interventions Patient/Family Education, Pain Management, Bladder Management, Medication Management, Discharge Planning, Dysphagia/Aspiration Precaution Training, Skin Care/Wound Management, Disease Management/Prevention, Bowel Management  PT interventions Community reintegration, Ambulation/gait training, DME/adaptive equipment instruction, Neuromuscular re-education, Psychosocial support, Stair training, UE/LE Strength taining/ROM, Wheelchair propulsion/positioning, UE/LE Coordination activities, Therapeutic Activities, Skin care/wound management, Pain management, Discharge planning, Balance/vestibular training, Cognitive remediation/compensation, Disease management/prevention, Functional mobility training, Patient/family education, Splinting/orthotics, Therapeutic Exercise, Visual/perceptual remediation/compensation  OT Interventions Balance/vestibular training, Discharge planning, Pain management, Self Care/advanced ADL retraining, Therapeutic Activities, UE/LE Coordination activities, Cognitive remediation/compensation, Disease mangement/prevention, Functional mobility training, Patient/family education, Therapeutic Exercise, Visual/perceptual remediation/compensation, Academic librarian, Engineer, drilling, Neuromuscular re-education, Psychosocial support, UE/LE Strength taining/ROM, Wheelchair propulsion/positioning  SLP Interventions Cognitive remediation/compensation, Environmental controls, Internal/external aids, Therapeutic Activities, Patient/family education, Dysphagia/aspiration precaution training, Cueing hierarchy, Functional tasks  TR Interventions    SW/CM Interventions Discharge Planning, Psychosocial Support, Patient/Family Education   Barriers to  Discharge MD  Medical stability  Nursing Medical stability    PT Home environment access/layout, Lack of/limited family support, Behavior wife unable to provide physical assistance upon d/c, will need rail installed at home if possible  OT Decreased caregiver support, Home environment access/layout wife may not be able to provide needed assist at d/c  SLP      SW  Team Discharge Planning: Destination: PT-Home ,OT- [place] (SNF) , SLP-Home Projected Follow-up: PT-24 hour supervision/assistance, Home health PT, OT-  Skilled nursing facility, SLP-Home Health SLP, 24 hour supervision/assistance, Outpatient SLP Projected Equipment Needs: PT-To be determined, OT- To be determined, SLP-To be determined Equipment Details: PT- , OT-  Patient/family involved in discharge planning: PT- Family member/caregiver, Patient,  OT-Patient, SLP-Patient, Family member/caregiver  MD ELOS: 20-28 days Medical Rehab Prognosis:  Good Assessment: The patient has been admitted for CIR therapies with the diagnosis of anoxic encephalopathy. The team will be addressing functional mobility, strength, stamina, balance, safety, adaptive techniques and equipment, self-care, bowel and bladder mgt, patient and caregiver education, activity tolerance, NMR, pain control. Goals have been set at min assist for basic self-care and ADL's, min assist for mobility and supervision for cognition.    Meredith Staggers, MD, FAAPMR      See Team Conference Notes for weekly updates to the plan of care

## 2017-08-29 NOTE — Plan of Care (Signed)
Pt requires max to total assist with care, incontinent of b/b,

## 2017-08-29 NOTE — Progress Notes (Signed)
Occupational Therapy Session Note  Patient Details  Name: Malik Rojas MRN: [id number] Date of Birth: November 05, 1931  Today's Date: 08/29/2017 OT Individual Time: 1975-8832 and 1300-1330 OT Individual Time Calculation (min): 58 min and 30 min   Short Term Goals: Week 1:  OT Short Term Goal 1 (Week 1): Pt will perform toilet transfer with max A. OT Short Term Goal 2 (Week 1): Pt will demonstrate mod A standing balance during LB clothing management.  OT Short Term Goal 3 (Week 1): Pt will perform UB dressing with mod A in order to decrease level of assistance with self care.  Skilled Therapeutic Interventions/Progress Updates:    Pt greeted in w/c with NT at start of session. Required encouragement to participate. Had pt select pants from open suitcase placed on bed. Pt asking "what is this?" and unable to verbally distinguish between shirt/pants/socks. When pants were unfolded and held up for him, pt still unable to recognize without cues. He vehemently refused to participate in other dressing tasks or change brief. Pt threaded bilateral LEs into pants with extra time and completed sit<stand at sink with Min A for balance. Increased time for elevating pants over hips but he did this himself. Pt motivated to drink orange juice. He self propelled to RN station with extra time and cues for sustained attention to task. Pt provided with nectar thickened orange juice and water at RN station. Cued him to propel to the dayroom, with pt once again requiring cues for sustained attention, as he would periodically stop and become externally distracted. Once in dayroom, he required cues for small sips while consuming beverage. Then he self propelled back to room for continued UB strengthening/endurance remediation. Mod cues for pathfinding, though he was able to verbalize correct room number that OT had told him earlier. Pt left in w/c with safety belt fastened and family present.   02 sats on RA today 93-96%. RN  notified and agreed to keep pt on RA at end of tx session   2nd Session 1:1 tx (30 min) Pt greeted seated on toilet, finishing up BM. Pt completed 90% of hygiene while standing dynamically with extra time to initiate. Min A for sit<stand transitions with tactile cues for hand placement and verbal cues for sustained attention. He completed clothing mgt post toileting, and then transferred back to w/c with Mod A via stand pivot. Next completed hand hygiene while standing at sink with cues for initiation, sequencing, and scanning.  Min A for balance. Spouse was present and intervened at times, providing more assistance than necessary. When pt was seated, discussed with spouse and family therapeutic methods to facilitate pt problem solving vs. doing tasks for him. They appeared receptive to education and verbalized understanding. At end of tx pt was left with family, anticipating next therapist.    Therapy Documentation Precautions:  Precautions Precautions: Fall Restrictions Weight Bearing Restrictions: No Pain: No c/o pain during tx   ADL:     See Function Navigator for Current Functional Status.   Therapy/Group: Individual Therapy  Almond Fitzgibbon A Keyan Folson 08/29/2017, 12:42 PM

## 2017-08-29 NOTE — Progress Notes (Signed)
Physical Therapy Session Note  Patient Details  Name: Malik Rojas MRN: [id number] Date of Birth: [birth year]  Today's Date: 08/29/2017 PT Individual Time: 1108-1202 PT Individual Time Calculation (min): 54 min   Short Term Goals: Week 1:  PT Short Term Goal 1 (Week 1): Pt will initiate stair negotiation. PT Short Term Goal 2 (Week 1): Pt will complete car transfer with mod assist. PT Short Term Goal 3 (Week 1): Pt will ambulate 50 ft with LRAD & mod assist.  Skilled Therapeutic Interventions/Progress Updates:  Pt received in room with family present and pt consuming meal. Confirmed with SLP and re-educated pt & family on need for staff supervision only at this time, until family is checked off by SLP -- they voiced understanding. Pt propelled w/c room>gym wit BUE & intermittent mod assist for obstacle avoidance. Pt requires cuing to continue participating in task as he has poor sustained attention. Pt completes sit>stand transfers with mod assist 2/2 posterior lean, and requires max assist for stand>sit 2/2 impaired safety awareness and poor eccentric control. Gait training x 30 ft + 12 ft with RW & mod assist with cuing for increased step length RLE, to ambulate within RW, and multimodal cuing for upright posture. After ambulation pt's SpO2 = 91-93%, HR = 87-92 bpm. Pt continues to be confused, as during session he said, "Where'd my foot rests go? You want me to walk on the ground?". At end of session pt left sitting in w/c in room with QRB donned & family present to supervise.   Pt received on room air & on room air throughout session. No c/o SOB reported.    Therapy Documentation Precautions:  Precautions Precautions: Fall Restrictions Weight Bearing Restrictions: No  Pain: Denied c/o pain.  See Function Navigator for Current Functional Status.   Therapy/Group: Individual Therapy  Waunita Schooner 08/29/2017, 12:21 PM

## 2017-08-29 NOTE — Progress Notes (Signed)
Speech Language Pathology Daily Session Note  Patient Details  Name: Malik Rojas MRN: [id number] Date of Birth: 18-Feb-1932  Today's Date: 08/29/2017 SLP Individual Time: 2585-2778 SLP Individual Time Calculation (min): 30 min  Short Term Goals: Week 1: SLP Short Term Goal 1 (Week 1): Patient will consume current diet with minimal overt s/s of aspiration and Min A verbal cues for use of swallowing compensatory strategies.  SLP Short Term Goal 2 (Week 1): Patient will consume trials of thin liquids with minimal overt s/s of aspiration over 2 sessions with Min A verbal cues to assess readiness for upgrade vs repeat MBS SLP Short Term Goal 3 (Week 1): Patient will demonstrate sustained attention to a functional task for ~5 minutes with Min A verbal cues for redirection.  SLP Short Term Goal 4 (Week 1): Patient will identify 1 physical and 1 cognitive deficit with Max A multimodal cues.  SLP Short Term Goal 5 (Week 1): Patient will demonstrate functional problem solving for basic and familiar tasks with Mod A verbal cues.  SLP Short Term Goal 6 (Week 1): Patient will utilize external memory aids to maximize orientation with Mod A verbal and visual cues.   Skilled Therapeutic Interventions:  Pt was seen for skilled ST targeting cognitive and dysphagia goals.  Pt was awake but drowsy and pleasantly confused upon therapist's arrival.  Pt needed mod-max assist verbal and tactile cues for initiation and sequencing of oral care while seated at sink.  Pt needed mod assist verbal cues for use of standard swallowing precautions when consuming therapeutic trials of thin liquids via cup sips.  No overt s/s of aspiration were evident with trials of thins; however, pt continues to have hoarse, breathy vocal quality which is concerning for decreased airway protection during the swallow.  SLP also facilitated the session with skilled education with pt's wife, daughter, and granddaughter during pt's lunch meal.   SLP discussed rationale for current diet and swallowing precautions given results of most recent MBS.  SLP discussed strategies to maximize pt's attention and alertness during meals and signs that warranted cessation of meal (ie pt falling asleep during meal).  SLP also discussed recommended dys 2 textures and demonstrated how to modify solids in order to be compliant with current diet.  During meal, pt needed mod assist verbal cues for redirection to meal in order to facilitate clearance of solids from the oral cavity.  Pt also needed mod-max assist multimodal cues from wife for rate and portion control due to impulsivity with PO intake.  All questions were answered to pt's and wife's satisfaction at this time.  Wife is now signed off to supervise pt during meals.  Pt left sitting in wheelchair with wife at bedside.  Continue per current plan of care.    Function:  Eating Eating   Modified Consistency Diet: Yes Eating Assist Level: Helper checks for pocketed food;Supervision or verbal cues;Set up assist for   Eating Set Up Assist For: Opening containers;Cutting food       Cognition Comprehension Comprehension assist level: Understands basic 75 - 89% of the time/ requires cueing 10 - 24% of the time  Expression   Expression assist level: Expresses basic 25 - 49% of the time/requires cueing 50 - 75% of the time. Uses single words/gestures.  Social Interaction Social Interaction assist level: Interacts appropriately 50 - 74% of the time - May be physically or verbally inappropriate.  Problem Solving Problem solving assist level: Solves basic 25 - 49% of the  time - needs direction more than half the time to initiate, plan or complete simple activities  Memory Memory assist level: Recognizes or recalls less than 25% of the time/requires cueing greater than 75% of the time    Pain Pain Assessment Pain Assessment: No/denies pain  Therapy/Group: Individual Therapy  Shaelynn Dragos, Selinda Orion 08/29/2017,  12:45 PM

## 2017-08-29 NOTE — Progress Notes (Signed)
Physical Therapy Note  Patient Details  Name: Severus Brodzinski MRN: [id number] Date of Birth: 04-Nov-1931 Today's Date: 08/29/2017    Time: 1345-1430 45 minutes  1:1 No c/o pain.  Pt in w/c and agreeable to PT session. Pt performs sit to stand throughout session without AD with mod A consistently.  Sit to stand blocked practice with RW in front with cues for UE placement and mod A, no carryover during session, pt requires max cuing each time for UE placement.  Gait 25' x 3 with RW with min guard.  Standing balance and side stepping with mod/max multimodal cues, min A for balance.  Stand pivot transfers with RW with mod A. nustep x 5 minutes with mod cuing for attention to task.  Pt passed off to OT at end of session.   Ryelee Albee 08/29/2017, 2:26 PM

## 2017-08-29 NOTE — Progress Notes (Signed)
ANTICOAGULATION CONSULT NOTE   Pharmacy Consult for Coumadin  Indication: DVT  No Known Allergies  Patient Measurements: Weight: 212 lb 1.8 oz (96.2 kg)  HDWt: 92.1 kg  Vital Signs: BP: 140/55 (01/25 0638)  Labs: Recent Labs    08/27/17 0805 08/28/17 0616 08/29/17 0711  HGB 13.4  --   --   HCT 41.7  --   --   PLT 231  --   --   LABPROT 24.8* 25.9* 27.5*  INR 2.26 2.39 2.59  CREATININE 2.99*  --  3.04*    Estimated Creatinine Clearance: 21.4 mL/min (A) (by C-G formula based on SCr of 3.04 mg/dL (H)).   Assessment: 84 year old male s/p prolonged CPR with CHF, respiratory failure, AKI, now s/p heparin, continuing coumadin for new DVT, completed 5 days of overlap. Cbc stable. PO intake increased the last two days. No bleeding noted  INR remains therapeutic at 2.59  Goal of Therapy:  INR 2-3 Monitor platelets by anticoagulation protocol: Yes   Plan:  Give warfarin 5 mg po x 1 Monitor daily INR, CBC, clinical course, s/sx of bleed, PO intake, DDI   Thank you for allowing [place] to participate in this patients care.  Maryanna Shape, PharmD, BCPS  Clinical Pharmacist  Pager: [phone]   08/29/2017 3:40 PM

## 2017-08-29 NOTE — Progress Notes (Addendum)
Occupational Therapy Session Note  Patient Details  Name: Malik Rojas MRN: [id number] Date of Birth: 1931/12/08  Today's Date: 08/29/2017 OT Individual Time: 1430-1458 OT Individual Time Calculation (min): 28 min    Short Term Goals: Week 1:  OT Short Term Goal 1 (Week 1): Pt will perform toilet transfer with max A. OT Short Term Goal 2 (Week 1): Pt will demonstrate mod A standing balance during LB clothing management.  OT Short Term Goal 3 (Week 1): Pt will perform UB dressing with mod A in order to decrease level of assistance with self care.  Skilled Therapeutic Interventions/Progress Updates:    1:1. Pt recived with direct handoff from PT. Pt lethargic and closing eye srequiring increased time to arouse. Focus of session on L NMR, L attention, sustained attention, and anterior weight shifting. With clothespins placed on L, pt requires max fading to min VC for forced use of LUE to grab clothespin, cross midline and reach to place onto high target for LUE reaching and L attention. Pt states, "im right handed what difference does it make if I use my L hand." OT educates on functional use/reach using LUE. Seated in front of dynavision, pt locates light targets with 4-8 seconds in hemisphere and 2-3 seconds on R with VC for sustained attention and scanning pattern. Exited session with pt seated in w/c with call light in reach and wife present in room.   Therapy Documentation Precautions:  Precautions Precautions: Fall Restrictions Weight Bearing Restrictions: No  See Function Navigator for Current Functional Status.   Therapy/Group: Individual Therapy  Tonny Branch 08/29/2017, 2:41 PM

## 2017-08-29 NOTE — Progress Notes (Signed)
Social Work  Social Work Assessment and Plan  Patient Details  Name: Malik Rojas MRN: [id number] Date of Birth: [birth year]  Today's Date: 08/29/2017  Problem List:  Patient Active Problem List   Diagnosis Date Noted  . Anoxic encephalopathy ([place]) 08/31/2017  . Oropharyngeal dysphagia   . Diabetes mellitus type 2 in obese ([place])   . Pressure injury of skin 08/22/2017  . Palliative care by specialist   . DNR (do not resuscitate)   . Weakness generalized   . Respiratory failure ([place])   . Acute renal failure superimposed on stage 3 chronic kidney disease ([place]) 08/18/2017  . Acute pulmonary edema ([facility])   . Cardiac arrest ([place]) 08/11/2017  . Acute hypercapnic respiratory failure ([place]) 08/11/2017  . Uncontrolled hypertension 08/11/2017  . Acute on chronic combined systolic and diastolic CHF, NYHA class 3 ([place])   . Hypertensive retinopathy of left eye, grade 1 03/25/2017  . Controlled diabetes mellitus type 2 with complications ([place]) 41/32/4401  . Chronic combined systolic and diastolic CHF (congestive heart failure) ([place]) 11/06/2016  . Squamous cell carcinoma of skin 02/20/2016  . Chronic acquired lymphedema 08/02/2015  . Mixed incontinence 03/23/2015  . Benign prostatic hyperplasia with weak urinary stream 10/13/2014  . Hyperlipidemia associated with type 2 diabetes mellitus ([place]) 05/25/2013  . Chronic kidney disease, stage III (moderate) ([place]) 05/25/2013  . Diabetes mellitus due to underlying condition with diabetic nephropathy, without long-term current use of insulin ([place]) 10/01/2011  . Xerostomia 08/28/2011  . Hypertensive heart disease with CHF (congestive heart failure) ([place]) 08/28/2011  . Obesity (BMI 30-39.9) 05/30/2011  . Pacemaker-Medtronic 11/21/2010  . DEPRESSION/ANXIETY 08/29/2009  . Obstructive sleep apnea 07/21/2009   Past Medical History:  Past Medical History:  Diagnosis Date  . Basal cell carcinoma    "scalp, arms, right face;  some cut, some burndt off"   . CAD (coronary artery disease)    LHC 3/14: Distal left main 10%, LAD 20%, circumflex 10-20%, proximal RCA 20%, distal 40-50%, EF 50-55%.  . Chronic combined systolic and diastolic CHF, NYHA class 4 ([place]) 11/06/2016   Echo 4/18: severe LVH, EF 45-50, global HK, Gr 1 DD, mild AS (mean 9/peak 17), MAC, mod LAE, trivial eff  . Complete heart block (H[facility])    Status post Medtronic pacemaker  . High cholesterol   . HTN (hypertension)   . Hx of cardiovascular stress test    a. [place] MV 3/14:  Low risk, EF 36%, apical inf scar, no ischemia, inf-apical HK>> catheterization however demonstrated normal left ventricular function  . Melanoma ([place])    back  . Obesity   . Pneumonia 1940  . Presence of permanent cardiac pacemaker    Medtronic  . Type II diabetes mellitus ([place])   . Urinary frequency   . Urinary incontinence    Past Surgical History:  Past Surgical History:  Procedure Laterality Date  . APPENDECTOMY  40 years ago  . BASAL CELL CARCINOMA EXCISION     "scalp, right face"  . INSERT / REPLACE / REMOVE PACEMAKER  ~ 1999   medtronic EnRhythm  . MOHS SURGERY Left    ear; being monitored-every 6 months  . PERMANENT PACEMAKER GENERATOR CHANGE N/A 02/12/2013   Procedure: PERMANENT PACEMAKER GENERATOR CHANGE;  Surgeon: Deboraha Sprang, MD;  Location: [facility] CATH LAB;  Service: Cardiovascular;  Laterality: N/A;  . TONSILLECTOMY     as child  . TRANSURETHRAL RESECTION OF PROSTATE N/A 10/13/2014   Procedure: WOLF TRANSURETHRAL RESECTION OF THE PROSTATE (TURP);  Surgeon: Carolan Clines, MD;  Location: [facility] ORS;  Service: Urology;  Laterality: N/A;   Social History:  reports that he quit smoking about 44 years ago. He has a 90.00 pack-year smoking history. he has never used smokeless tobacco. He reports that he drinks alcohol. He reports that he does not use drugs.  Family / Support Systems Marital Status: Married How Long?: 76 yrs (pt's 2nd marriage) Patient Roles: Spouse, Parent, Other  (Comment)(grandparent) Spouse/Significant Other: wife, Malik Rojas @ (H) [phone] or (C) [phone] Children: daughter, Malik Ro[facility]) @ (C) [phone];  pt has two children from his 1st marriage and both living on [place] - limited contact Anticipated Caregiver: wife Ability/Limitations of Caregiver: min assist level Caregiver Availability: 24/7 Family Dynamics: Pt becomes tearful as he takes his wife's hand and states, "I'd be nothing if it weren't for Malik Rojas in my life."  Wife, daughter and grandaughter very supportive and encouraging for pt.  Social History Preferred language: English Religion: Baptist Cultural Background: NA Read: Yes Write: Yes Employment Status: Retired Freight forwarder Issues: None Guardian/Conservator: None - per MD< pt is not capable of making decisions on his own behalf - defer to spouse   Abuse/Neglect Abuse/Neglect Assessment Can Be Completed: Yes Physical Abuse: Denies Verbal Abuse: Denies Sexual Abuse: Denies Exploitation of patient/patient's resources: Denies Self-Neglect: Denies  Emotional Status Pt's affect, behavior adn adjustment status: Pleasant, elderly gentleman sitting up in wheelchair with family at bedside who is able to answer basic, personal questions and family assists as needed.  Patient's responses are somewhat slow but family points out that "he is always done things slow."  Patient denies any significant emotional distress.  Does become a little tearful when he talks about the wonderful support he gets from his wife and family.  Will refer for neuropsychology for further cognitive evaluation. Recent Psychosocial Issues: None Pyschiatric History: None Substance Abuse History: None  Patient / Family Perceptions, Expectations & Goals Pt/Family understanding of illness & functional limitations: Patient states he has no recall of his collapse at home nor of earlier days of hospitalization.  Wife and daughter had a  general understanding of cardiac issues as well as the hypoxic event that patient suffered.  General understanding of current cognitive deficits and generally hopeful that he will make a good recovery Premorbid pt/family roles/activities: Patient was completely independent PTA. Anticipated changes in roles/activities/participation: Team anticipates patient may require minimal assistance overall wife prepared to provide primary caregiver assist Pt/family expectations/goals: "I just hope I can go home soon."  [employer]: None Premorbid Home Care/DME Agencies: None Transportation available at discharge: yes Resource referrals recommended: Neuropsychology  Discharge Planning Living Arrangements: Spouse/significant other Support Systems: Spouse/significant other, Children Type of Residence: Private residence Insurance Resources: Medicare(UHC Medicare) Financial Resources: [place] Referred: No Living Expenses: Own Money Management: Patient, Spouse Does the patient have any problems obtaining your medications?: No Home Management: Pt and wife share home management Patient/Family Preliminary Plans: Wife reports plan is for patient to DC home her as primary caregiver. Social Work Anticipated Follow Up Needs: HH/OP  Clinical Impression Unfortunate, elderly gentleman who suffered cardiac arrest at home and now on CIR for debility and anoxic brain injury.  Family at bedside and very supportive and encouraging the patient.  Wife reports that she will be primary caregiver at home with intermittent assistance from their daughter.  Patient does not report or appear to be in any significant emotional distress, however, will monitor and refer for neuropsychology  for support as needed.  Malik Rojas 08/29/2017, 4:09 PM

## 2017-08-29 NOTE — Progress Notes (Signed)
[facility]     PROGRESS NOTE    Subjective/Complaints: Slept last night. Denies pain this morning. Wants to go home.   ROS: Limited due to cognitive/behavioral   Objective: Vital Signs: Blood pressure (!) 140/55, pulse 62, temperature (!) 97.5 F (36.4 C), temperature source Oral, resp. rate 15, weight 96.2 kg (212 lb 1.8 oz), SpO2 96 %. No results found. Recent Labs    08/27/17 0805  WBC 8.9  HGB 13.4  HCT 41.7  PLT 231   Recent Labs    08/27/17 0805 08/29/17 0711  NA 144 143  K 5.2* 5.4*  CL 104 104  GLUCOSE 167* 141*  BUN 107* 102*  CREATININE 2.99* 3.04*  CALCIUM 9.1 9.1   CBG (last 3)  Recent Labs    08/27/17 2114 08/28/17 0631 08/28/17 1150  GLUCAP 174* 164* 185*    Wt Readings from Last 3 Encounters:  08/28/17 96.2 kg (212 lb 1.8 oz)  08/18/2017 96.3 kg (212 lb 4.9 oz)  07/22/17 105.2 kg (232 lb)    Physical Exam:  Gen: at eob. No distress.   HENT:  Head: Normocephalic and atraumatic.  Mouth/Throat: Oropharynx is clear and moist.  Eyes: Conjunctivae and EOM a about his his FMLA or whatever was I do not know I asked a question of return the message is what exactly her we giving question about need to have the information Hande if I call us so I have re normal. Pupils are equal, round, and reactive to light.  Neck: Normal range of motion. Neck supple.  Cardiovascular: RRR without murmur. No JVD   .  Respiratory: CTA Bilaterally without wheezes or rales. Normal effort  GI: Soft. Bowel sounds are normal. He exhibits no distension. There is no tenderness.  Musculoskeletal: He exhibits edema bilateral LE's, .  Edema RLE. Stasis changes BLE with flaky skin. Scabbed area on left shin with dry bloody drainage on dressing.  Neurological: slowed processing.  Follows simple commands.   Skin: Skin is warm and dry. He is not diaphoretic. Chronic venous changes in legs Psychiatric: His affect remains blunt. His speech is  delayed and slurred. He is slowed. Cognition and memory are impaired.  Decreased insight and awareness    Assessment/Plan: 1.  Functional and cognitive deficits secondary to anoxic brain injury/debility which require 3+ hours per day of interdisciplinary therapy in a comprehensive inpatient rehab setting. Physiatrist is providing close team supervision and 24 hour management of active medical problems listed below. Physiatrist and rehab team continue to assess barriers to discharge/monitor patient progress toward functional and medical goals.  Function:  Bathing Bathing position   Position: Shower  Bathing parts Body parts bathed by patient: Right arm, Left arm, Chest, Abdomen, Front perineal area, Right upper leg, Left upper leg Body parts bathed by helper: Buttocks, Right upper leg, Left upper leg, Front perineal area  Bathing assist Assist Level: 2 helpers      Upper Body Dressing/Undressing Upper body dressing   What is the patient wearing?: [place] over shirt/dress - Perfomed by patient: Pull shirt over trunk Pull over shirt/dress - Perfomed by helper: Thread/unthread right sleeve, Thread/unthread left sleeve, Put head through opening, Pull shirt over trunk        Upper body assist Assist Level: More than reasonable time, 2 helpers   Set up : To obtain clothing/put away  Lower Body Dressing/Undressing Lower body dressing   What is the patient wearing?: Hospital  Gown, Non-skid slipper socks       Pants- Performed by helper: Thread/unthread right pants leg, Thread/unthread left pants leg, Pull pants up/down, Fasten/unfasten pants   Non-skid slipper socks- Performed by helper: Don/doff right sock, Don/doff left sock                  Lower body assist Assist for lower body dressing: 2 Helpers      Toileting Toileting Toileting activity did not occur: No continent bowel/bladder event        Toileting assist     Transfers Chair/bed transfer    Chair/bed transfer method: Stand pivot Chair/bed transfer assist level: Maximal assist (Pt 25 - 49%/lift and lower) Chair/bed transfer assistive device: Armrests, Walker, Bedrails     Locomotion Ambulation     Max distance: 2 ft Assist level: 2 helpers(max assist + 2nd person to manage oxygen tank)   Wheelchair Wheelchair activity did not occur: Safety/medical concerns Type: Manual Max wheelchair distance: 50 ft Assist Level: Touching or steadying assistance (Pt > 75%)  Cognition Comprehension Comprehension assist level: Understands basic 75 - 89% of the time/ requires cueing 10 - 24% of the time  Expression can make Expression assist level: Expresses basic 25 - 49% of the time/requires cueing 50 - 75% of the time. Uses single words/gestures.  Social Interaction Social Interaction assist level: Interacts appropriately 50 - 74% of the time - May be physically or verbally inappropriate.  Problem Solving Problem solving assist level: Solves basic 25 - 49% of the time - needs direction more than half the time to initiate, plan or complete simple activities  Memory Memory assist level: Recognizes or recalls 25 - 49% of the time/requires cueing 50 - 75% of the time   Medical Problem List and Plan:  1. Functional and cognitive deficits secondary to anoxic brain injury and debility    -Continue PT, OT, ST.  May need to consider 15 hours over 7-day intensity given current activity tolerance. 2. Right femoral vein/iliac veinDVT /Anticoagulation: Pharmaceutical: Coumadin.  INR therapeutic today 2.59 1/25 3. Pain Management: Tylenol. Limit neuro sedating medications due to arousal  4. Mood: Team to provide ego support and motivation as possible. Social worker to screen.  5. Neuropsych: This patient is not fully capable of making decisions on his own behalf.  Normalize sleep-wake patterns  6. Skin/Wound Care: Routine pressure relief measures.   7. Fluids/Electrolytes/Nutrition: . Continues on  nectar liquids due to dysphagia.    -Encourage p.o. Fluids.  Intake is reasonable 8. HTN: Monitor BP bid. On coreg, hydralazine and Isordil.  9. Acute on chronic combined CHF: Monitor weights daily. Strict I/O. No ACE/ARB due to CKD. Continue coreg, hydralazine, Isordil and pravastatin.     -Weight stable at present [facility] Weights   08/27/17 0020 08/28/17 0009  Weight: 96 kg (211 lb 10.3 oz) 96.2 kg (212 lb 1.8 oz)    10. Acute on chronic renal failure (CK III): Continue to monitor renal status serially. BUN/SCR peaked at 137/4.4 ---102/3.04 today.   -hyperkalemia: 5.4 today, kayexalate 15g today   -follow serial labs 11. T2DM: Monitor BS ac/hs. On lantus 10 units bid with SSI for elevated BS.    -inconsistent, follow for pattern   - Sugars have not been well controlled prior to admission to rehab  12. Leucocytosis: Monitor for signs of infection.  White count down to 8.9 today.  CBC personally reviewed   LOS (Days) 3 A FACE TO FACE EVALUATION WAS PERFORMED  SWARTZ,ZACHARY  T, MD 08/29/2017 8:59 AM

## 2017-08-30 ENCOUNTER — Inpatient Hospital Stay (HOSPITAL_COMMUNITY): Payer: Medicare Other | Admitting: Physical Therapy

## 2017-08-30 ENCOUNTER — Inpatient Hospital Stay (HOSPITAL_COMMUNITY): Payer: Medicare Other

## 2017-08-30 ENCOUNTER — Inpatient Hospital Stay (HOSPITAL_COMMUNITY): Payer: Medicare Other | Admitting: Occupational Therapy

## 2017-08-30 LAB — BASIC METABOLIC PANEL
Anion gap: 11 (ref 5–15)
BUN: 86 mg/dL — ABNORMAL HIGH (ref 6–20)
CO2: 25 mmol/L (ref 22–32)
Calcium: 8.6 mg/dL — ABNORMAL LOW (ref 8.9–10.3)
Chloride: 105 mmol/L (ref 101–111)
Creatinine, Ser: 2.71 mg/dL — ABNORMAL HIGH (ref 0.61–1.24)
GFR calc Af Amer: 23 mL/min — ABNORMAL LOW (ref >60)
GFR, EST NON AFRICAN AMERICAN: 20 mL/min — AB (ref >60)
GLUCOSE: 140 mg/dL — AB (ref 65–99)
POTASSIUM: 5.1 mmol/L (ref 3.5–5.1)
Sodium: 141 mmol/L (ref 135–145)

## 2017-08-30 LAB — PROTIME-INR
INR: 2.54
PROTHROMBIN TIME: 27.2 s — AB (ref 11.4–15.2)

## 2017-08-30 MED ORDER — WARFARIN SODIUM 5 MG PO TABS
5.0000 mg | ORAL_TABLET | Freq: Once | ORAL | Status: AC
  Administered 2017-08-30: 5 mg via ORAL
  Filled 2017-08-30: qty 1

## 2017-08-30 NOTE — Progress Notes (Signed)
Malik Rojas is a 84 y.o. male [birth year] [id number]  Subjective: No new complaints, feels well today and glad for good day (participation) yesterday. Spouse and g-dtr in room.  Objective: Vital signs in last 24 hours: Temp:  [97.5 F (36.4 C)-98.8 F (37.1 C)] 97.5 F (36.4 C) (01/26 0433) Pulse Rate:  [61-84] 84 (01/26 0433) Resp:  [16-22] 22 (01/26 0433) BP: (134-157)/(40-57) 134/50 (01/26 0655) SpO2:  [93 %-95 %] 95 % (01/26 0433) Weight change:  Last BM Date: 08/29/17  Intake/Output from previous day: 01/25 0701 - 01/26 0700 In: 1090 [P.O.:1090] Out: -   Physical Exam General: No apparent distress   In WC at table w/ family Lungs: Normal effort. Lungs clear to auscultation, no crackles or wheezes. Cardiovascular: Regular rate and rhythm, no edema Neurological: No new neurological deficits   Lab Results: BMET    Component Value Date/Time   NA 141 08/30/2017 0840   NA 143 05/23/2017 1157   K 5.1 08/30/2017 0840   CL 105 08/30/2017 0840   CO2 25 08/30/2017 0840   GLUCOSE 140 (H) 08/30/2017 0840   BUN 86 (H) 08/30/2017 0840   BUN 28 (H) 05/23/2017 1157   CREATININE 2.71 (H) 08/30/2017 0840   CREATININE 1.58 (H) 11/13/2016 1231   CALCIUM 8.6 (L) 08/30/2017 0840   GFRNONAA 20 (L) 08/30/2017 0840   GFRAA 23 (L) 08/30/2017 0840   CBC    Component Value Date/Time   WBC 8.9 08/27/2017 0805   RBC 4.22 08/27/2017 0805   HGB 13.4 08/27/2017 0805   HCT 41.7 08/27/2017 0805   PLT 231 08/27/2017 0805   MCV 98.8 08/27/2017 0805   MCH 31.8 08/27/2017 0805   MCHC 32.1 08/27/2017 0805   RDW 14.5 08/27/2017 0805   LYMPHSABS 1.4 08/27/2017 0805   MONOABS 0.5 08/27/2017 0805   EOSABS 0.3 08/27/2017 0805   BASOSABS 0.0 08/27/2017 0805   CBG's (last 3):   Recent Labs    08/28/17 2146 08/29/17 0648 08/29/17 1205  GLUCAP 204* 126* 222*   LFT's Lab Results  Component Value Date   ALT 17 08/27/2017   AST 22 08/27/2017   ALKPHOS 75 08/27/2017   BILITOT 0.8  08/27/2017    Studies/Results: No results found.  Medications:  I have reviewed the patient's current medications. Scheduled Medications: . aspirin  81 mg Oral Daily  . carvedilol  25 mg Oral BID  . hydrALAZINE  25 mg Oral Q8H  . hydrocerin   Topical BID  . insulin aspart  0-5 Units Subcutaneous QHS  . insulin aspart  0-9 Units Subcutaneous TID WC  . insulin glargine  10 Units Subcutaneous BID  . isosorbide dinitrate  10 mg Oral TID  . mouth rinse  15 mL Mouth Rinse q12n4p  . multivitamin with minerals  1 tablet Oral Daily  . pantoprazole sodium  40 mg Oral QHS  . pravastatin  80 mg Oral q1800  . warfarin  5 mg Oral ONCE-1800  . Warfarin - Pharmacist Dosing Inpatient   Does not apply q1800   PRN Medications: acetaminophen, albuterol, alum & mag hydroxide-simeth, artificial tears, bisacodyl, diphenhydrAMINE, Gerhardt's butt cream, guaiFENesin-dextromethorphan, prochlorperazine **OR** prochlorperazine **OR** prochlorperazine, RESOURCE THICKENUP CLEAR, senna-docusate, sodium phosphate, traZODone  Assessment/Plan: Principal Problem:   Anoxic encephalopathy (HCC) Active Problems:   Chronic kidney disease, stage III (moderate) (HCC)   Acute on chronic combined systolic and diastolic CHF, NYHA class 3 (HCC)   Oropharyngeal dysphagia   Diabetes mellitus type 2 in obese ([place])  1. Anoxic brain injury with cognitive and functional deficits - continue efforts at ongoing inpatient rehab therapies 2. DVT of R fem and iliac v - warfarin ongoing per pharm dosing.  Lab Results  Component Value Date   INR 2.54 08/30/2017   INR 2.59 08/29/2017   INR 2.39 08/28/2017   3. hypertension - BP controlled on current rx - continue same 4. Chronic combined systolic and diastolic HF - off ACE/ARB due to CKD - continue other med mgmt with beta-blocker, hydralazine, isordil and statin 5. Acute on chronic RF (CKD3) with hyperkalemia 1/25, improved to 5.1 s/p kayexalate - recheck Mon 1/28 6. DM2 in  obese, uncontrolled - on insulin basal + SSI w/ carb mod diet Lab Results  Component Value Date   HGBA1C 8.0 07/22/2017    Length of stay, days: 4   Valerie A. Asa Lente, MD 08/30/2017, 12:51 PM

## 2017-08-30 NOTE — Progress Notes (Signed)
Speech Language Pathology Daily Session Note  Patient Details  Name: Malik Rojas MRN: [id number] Date of Birth: 12/14/1931  Today's Date: 08/30/2017 SLP Individual Time: 0915-1000 SLP Individual Time Calculation (min): 45 min  Short Term Goals: Week 1: SLP Short Term Goal 1 (Week 1): Patient will consume current diet with minimal overt s/s of aspiration and Min A verbal cues for use of swallowing compensatory strategies.  SLP Short Term Goal 2 (Week 1): Patient will consume trials of thin liquids with minimal overt s/s of aspiration over 2 sessions with Min A verbal cues to assess readiness for upgrade vs repeat MBS SLP Short Term Goal 3 (Week 1): Patient will demonstrate sustained attention to a functional task for ~5 minutes with Min A verbal cues for redirection.  SLP Short Term Goal 4 (Week 1): Patient will identify 1 physical and 1 cognitive deficit with Max A multimodal cues.  SLP Short Term Goal 5 (Week 1): Patient will demonstrate functional problem solving for basic and familiar tasks with Mod A verbal cues.  SLP Short Term Goal 6 (Week 1): Patient will utilize external memory aids to maximize orientation with Mod A verbal and visual cues.   Skilled Therapeutic Interventions: Skilled ST services focused on swallow and cognitive skills. SLP facilitated oral care piror to thin liquid via cup trials. Pt demonstrated 1 immediate cough out of 8 oz, moderate blenches and required max a verbal and tactile cues to slow rate. SLP assess O2 status, began at 91 and with O2 nasal canal in place stated at 95, O2 level did not dip during thin liquid trials. SLP facilitated basic problem solving and sustained attention during sort colors and copying 2 peice pattern, pt required Max a verbal and visual cues with reduced attention. Pt was left in room with call bell within reach. Recommend to continue skilled ST services.      Function:  Eating Eating   Modified Consistency Diet: No(thin  trials) Eating Assist Level: Supervision or verbal cues   Eating Set Up Assist For: Opening containers;Cutting food       Cognition Comprehension Comprehension assist level: Understands basic 50 - 74% of the time/ requires cueing 25 - 49% of the time;Understands basic 25 - 49% of the time/ requires cueing 50 - 75% of the time  Expression   Expression assist level: Expresses basic 50 - 74% of the time/requires cueing 25 - 49% of the time. Needs to repeat parts of sentences.;Expresses basic 25 - 49% of the time/requires cueing 50 - 75% of the time. Uses single words/gestures.  Social Interaction Social Interaction assist level: Interacts appropriately 50 - 74% of the time - May be physically or verbally inappropriate.  Problem Solving Problem solving assist level: Solves basic less than 25% of the time - needs direction nearly all the time or does not effectively solve problems and may need a restraint for safety  Memory Memory assist level: Recognizes or recalls less than 25% of the time/requires cueing greater than 75% of the time    Pain Pain Assessment Pain Assessment: No/denies pain  Therapy/Group: Individual Therapy  Frances Ambrosino  [facility] 08/30/2017, 5:48 PM

## 2017-08-30 NOTE — Progress Notes (Signed)
Occupational Therapy Session Note  Patient Details  Name: Malik Rojas MRN: [id number] Date of Birth: April 13, 1932  Today's Date: 08/30/2017 OT Individual Time: 1100-1158 OT Individual Time Calculation (min): 58 min    Short Term Goals: Week 1:  OT Short Term Goal 1 (Week 1): Pt will perform toilet transfer with max A. OT Short Term Goal 2 (Week 1): Pt will demonstrate mod A standing balance during LB clothing management.  OT Short Term Goal 3 (Week 1): Pt will perform UB dressing with mod A in order to decrease level of assistance with self care.  Skilled Therapeutic Interventions/Progress Updates:    1:1. Pt and wife present at beginning of session. Pt finishing orange juice and wants another. Pt motivated to get dressed with promise of orange juice after completing task. Pt wife very involved during session and initally providing constant MAX VC for pt, however educated wife on delayed processing speed, decreased initiation and slowing commands. Pt dons pull over shirt with A to thread head into shirt and step by step cueing with increased time for initiation. With foot elevated on stool, pt unable to initiate reach all the way down to toes without HOH A from OT to thread BLE into pants. Pt sit to stand with touching A and advances pants past hips. To improve sustained attention and endurance, pt propels w/c to RN station to ask for cup of nectar thick orange juice with mod VC for propulsion technique/obstacle avoidance/directions. Pt able to recall room number. Exited session with pt seated in w.c, call light in reach and wife in room.   Therapy Documentation Precautions:  Precautions Precautions: Fall Restrictions Weight Bearing Restrictions: No  See Function Navigator for Current Functional Status.   Therapy/Group: Individual Therapy  Tonny Branch 08/30/2017, 6:51 AM

## 2017-08-30 NOTE — Progress Notes (Signed)
Occupational Therapy Session Note  Patient Details  Name: Malik Rojas MRN: [id number] Date of Birth: 01/28/1932  Today's Date: 08/30/2017 OT Individual Time: 0569-7948 OT Individual Time Calculation (min): 33 min   Short Term Goals: Week 1:  OT Short Term Goal 1 (Week 1): Pt will perform toilet transfer with max A. OT Short Term Goal 2 (Week 1): Pt will demonstrate mod A standing balance during LB clothing management.  OT Short Term Goal 3 (Week 1): Pt will perform UB dressing with mod A in order to decrease level of assistance with self care.  Skilled Therapeutic Interventions/Progress Updates:    Pt greeted in w/c with spouse present. Spouse Malik Rojas reported pt was too "sleepy" to eat lunch today. Lunch tray was in front of him with pt intermittently closing eyes. Tx focus on activity tolerance, UB strengthening, and sustained attention. Pt self propelled to dayroom with extra time and cues for attention and avoiding obstacles on Lt. Extra time required to self organize/problem solve when changing directions and turning. Once in dayroom, pt consumed ice cream (brought from tray) with supervision for taking small bites. Mod-max cues for pathfinding way back to room. He stopped in front of his doorway and did not move for 2 minutes. Pt able to verbalize room number he was told and room number on placard in front of him, but required cues to initiate propulsion into doorway. Pt was positioned at bedside, unable to tell spouse where we had gone and what we did. Pt left with spouse at session exit.   Therapy Documentation Precautions:  Precautions Precautions: Fall Restrictions Weight Bearing Restrictions: No Pain: No c/o pain during tx    ADL:      See Function Navigator for Current Functional Status.   Therapy/Group: Individual Therapy  Kiley Solimine A Coreena Rubalcava 08/30/2017, 3:35 PM

## 2017-08-30 NOTE — Progress Notes (Signed)
ANTICOAGULATION CONSULT NOTE   Pharmacy Consult for Coumadin  Indication: DVT  No Known Allergies  Patient Measurements: Weight: 212 lb 1.8 oz (96.2 kg)  HDWt: 92.1 kg  Vital Signs: Temp: 97.5 F (36.4 C) (01/26 0433) Temp Source: Oral (01/26 0433) BP: 134/50 (01/26 0655) Pulse Rate: 84 (01/26 0433)  Labs: Recent Labs    08/28/17 0616 08/29/17 0711 08/30/17 0840  LABPROT 25.9* 27.5* 27.2*  INR 2.39 2.59 2.54  CREATININE  --  3.04* 2.71*    Estimated Creatinine Clearance: 24 mL/min (A) (by C-G formula based on SCr of 2.71 mg/dL (H)).   Assessment: 84 year old male s/p prolonged CPR with CHF, respiratory failure, AKI, now s/p heparin, continuing coumadin for new DVT, completed 5 days of overlap. Cbc stable. PO intake increased the last two days. No bleeding noted  INR remains therapeutic at 2.54  Goal of Therapy:  INR 2-3 Monitor platelets by anticoagulation protocol: Yes   Plan:  Give warfarin 5 mg po x 1 Monitor daily INR, CBC, clinical course, s/sx of bleed, PO intake, DDI   Thank you for allowing [place] to participate in this patients care.  Angus Seller, PharmD Pharmacy Resident [phone] 08/30/2017 10:01 AM

## 2017-08-30 NOTE — Progress Notes (Signed)
Physical Therapy Session Note  Patient Details  Name: Malik Rojas MRN: [id number] Date of Birth: Nov 28, 1931  Today's Date: 08/30/2017 PT Individual Time: 6834-1962 PT Individual Time Calculation (min): 56 min   Short Term Goals: Week 1:  PT Short Term Goal 1 (Week 1): Pt will initiate stair negotiation. PT Short Term Goal 2 (Week 1): Pt will complete car transfer with mod assist. PT Short Term Goal 3 (Week 1): Pt will ambulate 50 ft with LRAD & mod assist.  Skilled Therapeutic Interventions/Progress Updates:   Pt in w/c and agreeable to therapy, no c/o pain throughout session. Pt self-propelled w/c towards therapy gym in multiple 20-30' bouts using BUEs. Brief rest in between bouts w/ verbal and tactile cues for initiation. Total assist w/c transport remainder of way. Attempted to perform sit<>stand transfer at high/low table to work on tolerance to standing, however unable to initiate despite max verbal, tactile, and manual cues. Transitioned to working on cognitive remediation while seated in w/c, performing cognitive tasks w/ BUEs. Tasks required sustained attention, short term memory, and problem solving. Max cues to initiate task, however able to complete w/ min verbal cues one initiated. Decreased initiation cues required w/ performing functional tasks such as folding towels, min cues to attend to task. Pt falling asleep often throughout session, requiring max cues to wake up again. Returned to room total assist in w/c and ended session in w/c and in care of wife, all needs met.   Therapy Documentation Precautions:  Precautions Precautions: Fall Restrictions Weight Bearing Restrictions: No  See Function Navigator for Current Functional Status.   Therapy/Group: Individual Therapy  Calyn Sivils K Arnette 08/30/2017, 4:02 PM

## 2017-08-31 ENCOUNTER — Inpatient Hospital Stay (HOSPITAL_COMMUNITY): Payer: Medicare Other | Admitting: Occupational Therapy

## 2017-08-31 LAB — PROTIME-INR
INR: 2.53
Prothrombin Time: 27 seconds — ABNORMAL HIGH (ref 11.4–15.2)

## 2017-08-31 MED ORDER — WARFARIN SODIUM 5 MG PO TABS
5.0000 mg | ORAL_TABLET | Freq: Once | ORAL | Status: DC
  Filled 2017-08-31: qty 1

## 2017-08-31 NOTE — Progress Notes (Signed)
Occupational Therapy Session Note  Patient Details  Name: Malik Rojas MRN: [id number] Date of Birth: 1931-12-29  Today's Date: 08/31/2017 OT Individual Time: 5009-3818 OT Individual Time Calculation (min): 70 min   Short Term Goals: Week 1:  OT Short Term Goal 1 (Week 1): Pt will perform toilet transfer with max A. OT Short Term Goal 2 (Week 1): Pt will demonstrate mod A standing balance during LB clothing management.  OT Short Term Goal 3 (Week 1): Pt will perform UB dressing with mod A in order to decrease level of assistance with self care.  Skilled Therapeutic Interventions/Progress Updates:    Pt greeted in w/c. Agreeable to shower. Tx focus on initiation, sustained attention, sequencing, and self organization during self care tasks. He completed stand pivot<TTB with Min A, visual cues, and increased time to process/plan. Bathing completed at sit<stand level, with mod multimodal cues for sequencing. Sit<stand with Min A to complete perihygiene. He dressed w/c level at sink afterwards. Spouse arrived, and initially very chatty. Pt visibly distracted by increased stimulus and unable to attend to her while donning shirt. Gently educated spouse about his distractibility/inability to multitask, and she was quiet for remainder of session to allow him to focus. After donning shirt with Min A, pt requiring significantly increased time to initiate donning pants. He would become externally distracted by brief, his own hand, then close eyes. Max A for LB dressing due to time constraints, Min A sit<stand to lift pants over hips. He combed hair with supervision while seated, and then was positioned at bedside next to spouse. Pt left with spouse at session exit.    Therapy Documentation Precautions:  Precautions Precautions: Fall Restrictions Weight Bearing Restrictions: No Pain: No c/o pain during tx   ADL:     See Function Navigator for Current Functional Status.   Therapy/Group:  Individual Therapy  Tamanna Whitson A Lova Urbieta 08/31/2017, 12:50 PM

## 2017-08-31 NOTE — Progress Notes (Signed)
ANTICOAGULATION CONSULT NOTE   Pharmacy Consult for Coumadin  Indication: DVT  No Known Allergies  Patient Measurements: Weight: 213 lb 13.5 oz (97 kg)  HDWt: 92.1 kg  Vital Signs: Temp: 98 F (36.7 C) (01/27 0231) Temp Source: Oral (01/27 0231) BP: 151/54 (01/27 0231) Pulse Rate: 69 (01/27 0231)  Labs: Recent Labs    08/29/17 0711 08/30/17 0840 08/31/17 0448  LABPROT 27.5* 27.2* 27.0*  INR 2.59 2.54 2.53  CREATININE 3.04* 2.71*  --     Estimated Creatinine Clearance: 24.1 mL/min (A) (by C-G formula based on SCr of 2.71 mg/dL (H)).   Assessment: 84 year old male s/p prolonged CPR with CHF, respiratory failure, AKI, now s/p heparin, continuing coumadin for new DVT, completed 5 days of overlap. Cbc stable. PO intake increased the last two days. No bleeding noted  INR remains therapeutic at 2.53  Goal of Therapy:  INR 2-3 Monitor platelets by anticoagulation protocol: Yes   Plan:  Warfarin 5 mg po x 1 Monitor daily INR, CBC, clinical course, s/sx of bleed, PO intake, DDI   Thank you for allowing [place] to participate in this patients care.  Angus Seller, PharmD Pharmacy Resident [phone] 08/31/2017 7:44 AM

## 2017-08-31 NOTE — Progress Notes (Signed)
Malik Rojas is a 84 y.o. male 17-May-1932 [id number]  Subjective: No new complaints.  Patient reports having a good day yesterday and feels well so far today.  Objective: Vital signs in last 24 hours: Temp:  [98 F (36.7 C)-98.4 F (36.9 C)] 98 F (36.7 C) (01/27 0231) Pulse Rate:  [63-72] 69 (01/27 0231) Resp:  [19-20] 19 (01/27 0231) BP: (139-156)/(53-61) 151/54 (01/27 0231) SpO2:  [96 %-100 %] 96 % (01/27 0231) Weight:  [97 kg (213 lb 13.5 oz)] 97 kg (213 lb 13.5 oz) (01/27 0231) Weight change:  Last BM Date: 08/28/17  Intake/Output from previous day: 01/26 0701 - 01/27 0700 In: 280 [P.O.:280] Out: -   Physical Exam General: No apparent distress   sitting beside bed in wheelchair over bedside table.  No family present at my visit Lungs: Normal effort. Lungs clear to auscultation, no crackles or wheezes. Cardiovascular: Regular rate and rhythm, no edema Neurological: No new neurological deficits   Lab Results: BMET    Component Value Date/Time   NA 141 08/30/2017 0840   NA 143 05/23/2017 1157   K 5.1 08/30/2017 0840   CL 105 08/30/2017 0840   CO2 25 08/30/2017 0840   GLUCOSE 140 (H) 08/30/2017 0840   BUN 86 (H) 08/30/2017 0840   BUN 28 (H) 05/23/2017 1157   CREATININE 2.71 (H) 08/30/2017 0840   CREATININE 1.58 (H) 11/13/2016 1231   CALCIUM 8.6 (L) 08/30/2017 0840   GFRNONAA 20 (L) 08/30/2017 0840   GFRAA 23 (L) 08/30/2017 0840   CBC    Component Value Date/Time   WBC 8.9 08/27/2017 0805   RBC 4.22 08/27/2017 0805   HGB 13.4 08/27/2017 0805   HCT 41.7 08/27/2017 0805   PLT 231 08/27/2017 0805   MCV 98.8 08/27/2017 0805   MCH 31.8 08/27/2017 0805   MCHC 32.1 08/27/2017 0805   RDW 14.5 08/27/2017 0805   LYMPHSABS 1.4 08/27/2017 0805   MONOABS 0.5 08/27/2017 0805   EOSABS 0.3 08/27/2017 0805   BASOSABS 0.0 08/27/2017 0805   CBG's (last 3):   Recent Labs    08/28/17 2146 08/29/17 0648 08/29/17 1205  GLUCAP 204* 126* 222*   LFT's Lab Results   Component Value Date   ALT 17 08/27/2017   AST 22 08/27/2017   ALKPHOS 75 08/27/2017   BILITOT 0.8 08/27/2017    Studies/Results: No results found.  Medications:  I have reviewed the patient's current medications. Scheduled Medications: . aspirin  81 mg Oral Daily  . carvedilol  25 mg Oral BID  . hydrALAZINE  25 mg Oral Q8H  . hydrocerin   Topical BID  . insulin aspart  0-5 Units Subcutaneous QHS  . insulin aspart  0-9 Units Subcutaneous TID WC  . insulin glargine  10 Units Subcutaneous BID  . isosorbide dinitrate  10 mg Oral TID  . mouth rinse  15 mL Mouth Rinse q12n4p  . multivitamin with minerals  1 tablet Oral Daily  . pantoprazole sodium  40 mg Oral QHS  . pravastatin  80 mg Oral q1800  . warfarin  5 mg Oral ONCE-1800  . Warfarin - Pharmacist Dosing Inpatient   Does not apply q1800   PRN Medications: acetaminophen, albuterol, alum & mag hydroxide-simeth, artificial tears, bisacodyl, diphenhydrAMINE, Gerhardt's butt cream, guaiFENesin-dextromethorphan, prochlorperazine **OR** prochlorperazine **OR** prochlorperazine, RESOURCE THICKENUP CLEAR, senna-docusate, sodium phosphate, traZODone  Assessment/Plan: Principal Problem:   Anoxic encephalopathy (HCC) Active Problems:   Chronic kidney disease, stage III (moderate) (HCC)   Acute on  chronic combined systolic and diastolic CHF, NYHA class 3 ([place])   Oropharyngeal dysphagia   Diabetes mellitus type 2 in obese ([place])   1.  Failure to thrive related to anoxic brain injury with cognitive and functional deficits.  To new ongoing CIR therapy 2.  DVT of right femoral and iliac veins.  Full dose warfarin ongoing per pharmacy protocol.  INR therapeutic. 3.  Hypertension.  Blood pressure remains controlled on current therapy, continue same. 4.  Chronic combined systolic and diastolic heart failure.  Continue medications management as ongoing, euvolemic. 5.  Acute on chronic renal disease, CKD 3.  Repeat bmet pending for tomorrow  Monday, January 28 6. type 2 diabetes, uncontrolled prior to admission.  Continue basal insulin with sliding scale as needed and carb modified diet.  Length of stay, days: 5  Malik Henri A. Asa Lente, MD 08/31/2017, 8:24 AM

## 2017-09-01 ENCOUNTER — Inpatient Hospital Stay (HOSPITAL_COMMUNITY): Payer: Medicare Other | Admitting: Speech Pathology

## 2017-09-01 ENCOUNTER — Inpatient Hospital Stay (HOSPITAL_COMMUNITY): Payer: Medicare Other | Admitting: Occupational Therapy

## 2017-09-01 ENCOUNTER — Inpatient Hospital Stay (HOSPITAL_COMMUNITY): Payer: Medicare Other | Admitting: Physical Therapy

## 2017-09-01 ENCOUNTER — Encounter (HOSPITAL_COMMUNITY): Payer: Medicare Other | Admitting: Psychology

## 2017-09-01 LAB — GLUCOSE, CAPILLARY
GLUCOSE-CAPILLARY: 143 mg/dL — AB (ref 65–99)
GLUCOSE-CAPILLARY: 143 mg/dL — AB (ref 65–99)
GLUCOSE-CAPILLARY: 149 mg/dL — AB (ref 65–99)
GLUCOSE-CAPILLARY: 191 mg/dL — AB (ref 65–99)
GLUCOSE-CAPILLARY: 205 mg/dL — AB (ref 65–99)
GLUCOSE-CAPILLARY: 135 mg/dL — AB (ref 65–99)
Glucose-Capillary: 164 mg/dL — ABNORMAL HIGH (ref 65–99)
Glucose-Capillary: 159 mg/dL — ABNORMAL HIGH (ref 65–99)
Glucose-Capillary: 155 mg/dL — ABNORMAL HIGH (ref 65–99)
Glucose-Capillary: 169 mg/dL — ABNORMAL HIGH (ref 65–99)
Glucose-Capillary: 141 mg/dL — ABNORMAL HIGH (ref 65–99)
Glucose-Capillary: 175 mg/dL — ABNORMAL HIGH (ref 65–99)
Glucose-Capillary: 159 mg/dL — ABNORMAL HIGH (ref 65–99)
Glucose-Capillary: 154 mg/dL — ABNORMAL HIGH (ref 65–99)
Glucose-Capillary: 161 mg/dL — ABNORMAL HIGH (ref 65–99)

## 2017-09-01 LAB — BASIC METABOLIC PANEL
Anion gap: 12 (ref 5–15)
BUN: 79 mg/dL — ABNORMAL HIGH (ref 6–20)
CALCIUM: 8.5 mg/dL — AB (ref 8.9–10.3)
CO2: 26 mmol/L (ref 22–32)
CREATININE: 2.85 mg/dL — AB (ref 0.61–1.24)
Chloride: 103 mmol/L (ref 101–111)
GFR calc non Af Amer: 19 mL/min — ABNORMAL LOW (ref >60)
GFR, EST AFRICAN AMERICAN: 22 mL/min — AB (ref >60)
Glucose, Bld: 218 mg/dL — ABNORMAL HIGH (ref 65–99)
Potassium: 5.5 mmol/L — ABNORMAL HIGH (ref 3.5–5.1)
Sodium: 141 mmol/L (ref 135–145)

## 2017-09-01 LAB — PROTIME-INR
INR: 2.55
Prothrombin Time: 27.2 seconds — ABNORMAL HIGH (ref 11.4–15.2)

## 2017-09-01 MED ORDER — LACTULOSE 10 GM/15ML PO SOLN: 30.0000 g | Freq: Once | ORAL | Status: DC

## 2017-09-01 MED ORDER — SODIUM POLYSTYRENE SULFONATE 15 GM/60ML PO SUSP
30.0000 g | Freq: Once | ORAL | Status: DC
  Filled 2017-09-01: qty 120

## 2017-09-01 MED ORDER — WARFARIN SODIUM 5 MG PO TABS
5.0000 mg | ORAL_TABLET | Freq: Every day | ORAL | Status: DC
  Administered 2017-09-01: 5 mg via ORAL
  Filled 2017-09-01: qty 1

## 2017-09-01 MED ORDER — COUMADIN BOOK
Freq: Once | Status: AC
  Administered 2017-09-01: 15:00:00
  Filled 2017-09-01: qty 1

## 2017-09-01 MED ORDER — QUETIAPINE FUMARATE 25 MG PO TABS
25.0000 mg | ORAL_TABLET | Freq: Every day | ORAL | Status: DC
  Administered 2017-09-01: 25 mg via ORAL
  Filled 2017-09-01: qty 1

## 2017-09-01 MED ORDER — WARFARIN VIDEO: Freq: Once | Status: DC

## 2017-09-01 MED ORDER — SODIUM POLYSTYRENE SULFONATE PO POWD
30.0000 g | Freq: Once | ORAL | Status: AC
  Administered 2017-09-01: 30 g via ORAL
  Filled 2017-09-01: qty 30

## 2017-09-01 NOTE — Progress Notes (Signed)
[place] PHYSICAL MEDICINE & REHABILITATION     PROGRESS NOTE    Subjective/Complaints: Patient sitting up in bed.  No new issues this morning.  Patient continues to display poor safety awareness and attempted to get out of bed numerous times per nurse this weekend.  ROS: pt denies nausea, vomiting, diarrhea, cough, shortness of breath or chest pain   Objective: Vital Signs: Blood pressure (!) 132/46, pulse 63, temperature (!) 97.3 F (36.3 C), temperature source Oral, resp. rate 17, weight 97.2 kg (214 lb 4.6 oz), SpO2 95 %. No results found. No results for input(s): WBC, HGB, HCT, PLT in the last 72 hours. Recent Labs    08/30/17 0840  NA 141  K 5.1  CL 105  GLUCOSE 140*  BUN 86*  CREATININE 2.71*  CALCIUM 8.6*   CBG (last 3)  Recent Labs    08/29/17 1205  GLUCAP 222*    Wt Readings from Last 3 Encounters:  09/01/17 97.2 kg (214 lb 4.6 oz)  08/14/2017 96.3 kg (212 lb 4.9 oz)  07/22/17 105.2 kg (232 lb)    Physical Exam:  Gen: at eob. No distress.   HENT:  Head: Normocephalic and atraumatic.  Mouth/Throat: Oropharynx is clear and moist.  Eyes: Conjunctivae and EOM a about his his FMLA or whatever was I do not know I asked a question of return the message is what exactly her we giving question about need to have the information Hande if I call us so I have re normal. Pupils are equal, round, and reactive to light.  Neck: Normal range of motion. Neck supple.  Cardiovascular: RRR without murmur. No JVD    .  Respiratory: CTA Bilaterally without wheezes or rales. Normal effort   GI: Stomach distended bowel sounds positive.  Musculoskeletal: He exhibits edema bilateral LE's, .  Edema RLE. Stasis changes BLE with flaky skin. Scabbed area on left shin with dry bloody drainage on dressing.  Neurological: slowed processing.  Follows simple commands.   Skin: Skin is warm and dry. He is not diaphoretic. Chronic venous changes in legs Psychiatric: Patient engages but with  limited insight and awareness    Assessment/Plan: 1.  Functional and cognitive deficits secondary to anoxic brain injury/debility which require 3+ hours per day of interdisciplinary therapy in a comprehensive inpatient rehab setting. Physiatrist is providing close team supervision and 24 hour management of active medical problems listed below. Physiatrist and rehab team continue to assess barriers to discharge/monitor patient progress toward functional and medical goals.  Function:  Bathing Bathing position   Position: Shower  Bathing parts Body parts bathed by patient: Right arm, Left arm, Chest, Abdomen, Front perineal area, Right upper leg, Left upper leg, Buttocks Body parts bathed by helper: Right lower leg, Left lower leg, Back  Bathing assist Assist Level: 2 helpers      Upper Body Dressing/Undressing Upper body dressing   What is the patient wearing?: Pull over shirt/dress     Pull over shirt/dress - Perfomed by patient: Thread/unthread right sleeve, Thread/unthread left sleeve, Put head through opening Pull over shirt/dress - Perfomed by helper: Pull shirt over trunk        Upper body assist Assist Level: More than reasonable time, 2 helpers   Set up : To obtain clothing/put away  Lower Body Dressing/Undressing Lower body dressing   What is the patient wearing?: Pants, Non-skid slipper socks     Pants- Performed by patient: Thread/unthread right pants leg, Thread/unthread left pants leg, Pull pants  up/down Pants- Performed by helper: Thread/unthread right pants leg, Thread/unthread left pants leg, Pull pants up/down   Non-skid slipper socks- Performed by helper: Don/doff right sock, Don/doff left sock                  Lower body assist Assist for lower body dressing: 2 Helpers      Toileting Toileting Toileting activity did not occur: No continent bowel/bladder event Toileting steps completed by patient: Adjust clothing after toileting Toileting steps  completed by helper: Adjust clothing prior to toileting, Performs perineal hygiene    Toileting assist Assist level: Two helpers   Transfers Chair/bed transfer   Chair/bed transfer method: Stand pivot Chair/bed transfer assist level: Maximal assist (Pt 25 - 49%/lift and lower) Chair/bed transfer assistive device: Armrests, Walker, Bedrails     Locomotion Ambulation     Max distance: 30 ft Assist level: Moderate assist (Pt 50 - 74%)   Wheelchair Wheelchair activity did not occur: Safety/medical concerns Type: Manual Max wheelchair distance: 30' Assist Level: Supervision or verbal cues  Cognition Comprehension Comprehension assist level: Understands basic 50 - 74% of the time/ requires cueing 25 - 49% of the time, Understands basic 25 - 49% of the time/ requires cueing 50 - 75% of the time  Expression can make Expression assist level: Expresses basic 50 - 74% of the time/requires cueing 25 - 49% of the time. Needs to repeat parts of sentences., Expresses basic 25 - 49% of the time/requires cueing 50 - 75% of the time. Uses single words/gestures.  Social Interaction Social Interaction assist level: Interacts appropriately 50 - 74% of the time - May be physically or verbally inappropriate.  Problem Solving Problem solving assist level: Solves basic less than 25% of the time - needs direction nearly all the time or does not effectively solve problems and may need a restraint for safety  Memory Memory assist level: Recognizes or recalls less than 25% of the time/requires cueing greater than 75% of the time   Medical Problem List and Plan:  1. Functional and cognitive deficits secondary to anoxic brain injury and debility    -Continue PT, OT, ST. continued cognitive deficits 2. Right femoral vein/iliac veinDVT /Anticoagulation: Pharmaceutical: Coumadin.  INR therapeutic today 2.59 1/25 3. Pain Management: Tylenol. Limit neuro sedating medications due to arousal  4. Mood: Team to provide ego  support and motivation as possible. Social worker to screen.  5. Neuropsych: This patient is not fully capable of making decisions on his own behalf.  Normalize sleep-wake patterns  6. Skin/Wound Care: Routine pressure relief measures.   7. Fluids/Electrolytes/Nutrition: . Continues on nectar liquids due to dysphagia.    -Encourage p.o. Fluids.  Intake is reasonable 8. HTN: Monitor BP bid. On coreg, hydralazine and Isordil.  9. Acute on chronic combined CHF: Monitor weights daily. Strict I/O. No ACE/ARB due to CKD. Continue coreg, hydralazine, Isordil and pravastatin.     -Continue to monitor weights Filed Weights   08/28/17 0009 08/31/17 0231 09/01/17 0228  Weight: 96.2 kg (212 lb 1.8 oz) 97 kg (213 lb 13.5 oz) 97.2 kg (214 lb 4.6 oz)    10. Acute on chronic renal failure (CK III): Continue to monitor renal status serially. BUN/SCR peaked at 137/4.4 ---102/3.04 today.   -hyperkalemia: Potassium 5.1 on Saturday.  Received Kayexalate on Friday   -follow serial labs, pending today 11. T2DM: Monitor BS ac/hs. On lantus 10 units bid with SSI for elevated BS.    -Increase Lantus to 12 units  twice daily   -Consider morning oral agent  12. Leucocytosis: Monitor for signs of infection.  White count down to 8.9 most recently  LOS (Days) 6 A FACE TO FACE EVALUATION WAS PERFORMED  Meredith Staggers, MD 09/01/2017 8:54 AM

## 2017-09-01 NOTE — Care Management (Signed)
Inpatient Rehabilitation Center Individual Statement of Services  Patient Name:  Malik Rojas  Date:  09/01/2017  Welcome to the [facility].  Our goal is to provide you with an individualized program based on your diagnosis and situation, designed to meet your specific needs.  With this comprehensive rehabilitation program, you will be expected to participate in at least 3 hours of rehabilitation therapies Monday-Friday, with modified therapy programming on the weekends.  Your rehabilitation program will include the following services:  Physical Therapy (PT), Occupational Therapy (OT), Speech Therapy (ST), 24 hour per day rehabilitation nursing, Therapeutic Recreaction (TR), Neuropsychology, Case Management (Social Worker), Rehabilitation Medicine, Nutrition Services and Pharmacy Services  Weekly team conferences will be held on Tuesdays to discuss your progress.  Your Social Worker will talk with you frequently to get your input and to update you on team discussions.  Team conferences with you and your family in attendance may also be held.  Expected length of stay: 3-4 weeks    Overall anticipated outcome: minimal assistance  Depending on your progress and recovery, your program may change. Your Social Worker will coordinate services and will keep you informed of any changes. Your Social Worker's name and contact numbers are listed  below.  The following services may also be recommended but are not provided by the [place] will be made to provide these services after discharge if needed.  Arrangements include referral to agencies that provide these services.  Your insurance has been verified to be:  [facility] Medicare Your primary doctor is:  [employer]  Pertinent information will be shared with your doctor and your insurance  company.  Social Worker:  [place], [place] or ([phone]   Information discussed with and copy given to patient by: Lennart Pall, 09/01/2017, 9:40 AM

## 2017-09-01 NOTE — Plan of Care (Signed)
  Not Progressing RH SAFETY RH STG ADHERE TO SAFETY PRECAUTIONS W/ASSISTANCE/DEVICE Description STG Adhere to Safety Precautions With min Assistance/Device.  Max assist, attempts to get OOB without assistance 09/01/2017 0344 - Not Progressing by Cornell Barman, RN

## 2017-09-01 NOTE — Progress Notes (Signed)
ANTICOAGULATION CONSULT NOTE   Pharmacy Consult for Coumadin  Indication: DVT  No Known Allergies  Patient Measurements: Weight: 214 lb 4.6 oz (97.2 kg)  HDWt: 92.1 kg  Vital Signs: Temp: 97.3 F (36.3 C) (01/28 0228) Temp Source: Oral (01/28 0228) BP: 132/46 (01/28 0228) Pulse Rate: 63 (01/28 0945)  Labs: Recent Labs    08/30/17 0840 08/31/17 0448 09/01/17 0634  LABPROT 27.2* 27.0* 27.2*  INR 2.54 2.53 2.55  CREATININE 2.71*  --   --     Estimated Creatinine Clearance: 24.2 mL/min (A) (by C-G formula based on SCr of 2.71 mg/dL (H)).   Assessment: 84 year old male s/p prolonged CPR with CHF, respiratory failure, AKI, now on coumadin for new DVT, completed 5 days of heparin overlap. No bleeding noted per  Chart. Last CBC on 1/23 INR 2.55 remains therapeutic today, noted coumadin dose not charted last night, reason not documented in chart, not sure if it was given or not.  Goal of Therapy:  INR 2-3 Monitor platelets by anticoagulation protocol: Yes   Plan:  Warfarin 5 mg po daily for now Monitor daily INR, CBC CBC in AM   Thank you for allowing [place] to participate in this patients care.  Maryanna Shape, PharmD, BCPS  Clinical Pharmacist  Pager: [phone]   09/01/2017 1:24 PM

## 2017-09-01 NOTE — Consult Note (Signed)
Neuropsychological Consultation   Patient:   Malik Rojas   DOB:   Feb 24, 1932  MR Number:  [id number]  Location:  [facility] [address] [id number] [place] [address] Dept: [place]: [phone]           Date of Service:   09/01/2017  Start Time:   8 AM End Time:   9 AM  Provider/Observer:  Ilean Skill, Psy.D.       Clinical Neuropsychologist       Billing Code/Service: [phone] 4 Units  Chief Complaint:    Malik Rojas is an 84 year old male with a history of hypertension, type 2 diabetes, congestive heart failure and other medical issues.  He was admitted on 08/11/2017 after cardiac arrest.  EMS reported faint pulse with VF as initial rhythm.  There were 37 minutes of CPR before arrival at ED.  He did become responsive and was intubated at admission.  There have been concerns about cognitive deficits after anoxic event due to cardiac arrest.  Reports from nursing and other inpatient staff report significant variability in cognitive functioning.  He has had times of good orientation and interactions while at other times disoriented or having arousal issues.  Reason for Service:  Mr. Hur was referred for neuropsychological evaluation due to concerns about anoxic event and degree of cognitive deficits and how they will impact rehabilitation.  Below is the HPI for the current admission.    HPI: Malik Rojas is a 84 y.o. male with history of HTN, DMT2, chronic combined CHF, NICM s/p PPM, recent URI symptoms; who was admitted on 08/11/2017 after cardiac arrest. EMS noted faint pulse with VF as initial rhythm. He required 37 Minutes CPR with ACLS protocol and pulse regained on arrival to ED. He was responsive and intubated at admission. Not felt to be a candidate for cardiac cath and treated with cooling. 2D echo showed diffuse hypokinesis, severe calcification on MV with mild MVR and  severe LVH and EF 25-30 %. CT head showed global atrophy, no hemorrhage and poor definition of anterolateral thalamus and lenticular nucleus question due to changes of hypoxia. Blood cultures X 2 negative. He was treated with diuresis due to fluid overload and tolerated extubation without difficulty on 01/14.  He was found to have DVT Right CFV to distal iliac vein and started on IV heparin bridge to coumadin. Cardiac arrest felt to be due to respiratory event leading cardiopulmonary arrest and question plans for ICD once stable. He was placed on dysphagia 2 diet with nectar liquids due to dysphagia. CHF compensated and AKI resolving. Repeat cardiac echo 1/20 revealed EF improved to 40-45% and no plans for ischemic work up. Has mild leucocytosis without evidence of infection. Confusion and lethargy is resolving but patient noted to be debilitated with evidence of anoxic encephalopathy. CIR recommended due to functional deficits.    Current Status:  The patient was seen this AM and was having great difficulty staying awake.  He was able to answer simple questions with one word answers.  He was oriented x3.  However, I was unable to do further assessment due to his inability to stay alert.  This does not appear to be constant state and will attempt to check with patient during one of his PT/OT sessions.  Behavioral Observation: Malik Rojas  presents as a 84 y.o.-year-old Right Caucasian Male who appeared his stated age. his dress was Appropriate and he  was Well Groomed and his manners were Appropriate to the situation.  his participation was indicative of Appropriate and Drowsy behaviors.  There were any physical disabilities noted.  he displayed an appropriate level of cooperation and motivation.     Interactions:    Minimal Drowsy  Attention:   abnormal and attention span appeared shorter than expected for age  Memory:   abnormal; global memory impairment noted  Visuo-spatial:  not  examined  Speech (Volume):  low  Speech:   normal; normal  Thought Process:  Coherent and Relevant  Though Content:  WNL; not suicidal  Orientation:   person, place and time/date  Judgment:   Fair  Planning:   Fair  Affect:    Flat and Lethargic  Mood:    NA  Insight:   Fair  Intelligence:   normal  Medical History:   Past Medical History:  Diagnosis Date  . Basal cell carcinoma    "scalp, arms, right face;  some cut, some burndt off"  . CAD (coronary artery disease)    LHC 3/14: Distal left main 10%, LAD 20%, circumflex 10-20%, proximal RCA 20%, distal 40-50%, EF 50-55%.  . Chronic combined systolic and diastolic CHF, NYHA class 4 ([place]) 11/06/2016   Echo 4/18: severe LVH, EF 45-50, global HK, Gr 1 DD, mild AS (mean 9/peak 17), MAC, mod LAE, trivial eff  . Complete heart block (HCC)    Status post Medtronic pacemaker  . High cholesterol   . HTN (hypertension)   . Hx of cardiovascular stress test    a. [place] MV 3/14:  Low risk, EF 36%, apical inf scar, no ischemia, inf-apical HK>> catheterization however demonstrated normal left ventricular function  . Melanoma ([place])    back  . Obesity   . Pneumonia 1940  . Presence of permanent cardiac pacemaker    Medtronic  . Type II diabetes mellitus ([place])   . Urinary frequency   . Urinary incontinence             Psychiatric History:  There are reports that the patient had issues with depressive symptoms after prior heart attack.  Family Med/Psych History:  Family History  Problem Relation Age of Onset  . Leukemia Mother     Risk of Suicide/Violence: low patient denies suicidal or homicidal ideation.  Impression/DX:  Malik Rojas is an 84 year old male with a history of hypertension, type 2 diabetes, congestive heart failure and other medical issues.  He was admitted on 08/11/2017 after cardiac arrest.  EMS reported faint pulse with VF as initial rhythm.  There were 37 minutes of CPR before arrival at ED.  He did  become responsive and was intubated at admission.  There have been concerns about cognitive deficits after anoxic event due to cardiac arrest.  Reports from nursing and other inpatient staff report significant variability in cognitive functioning.  He has had times of good orientation and interactions while at other times disoriented or having arousal issues.  The patient was seen this AM and was having great difficulty staying awake.  He was able to answer simple questions with one word answers.  He was oriented x3.  However, I was unable to do further assessment due to his inability to stay alert.  This does not appear to be constant state and will attempt to check with patient during one of his PT/OT sessions.   Disposition/Plan:  I will monitor the patient to see if we may be able to find a time when  his arousal levels are better to more fully assessed any residual neuropsychological deficits subsequent to his cardiac arrest/anoxic event.         Electronically Signed   _______________________ Ilean Skill, Psy.D.

## 2017-09-01 NOTE — Progress Notes (Signed)
Physical Therapy Session Note  Patient Details  Name: Malik Rojas MRN: [id number] Date of Birth: [birth year]  Today's Date: 09/01/2017 PT Individual Time: 9935-7017 and 1107-1201 PT Individual Time Calculation (min): 13 min and 54 min   Short Term Goals: Week 1:  PT Short Term Goal 1 (Week 1): Pt will initiate stair negotiation. PT Short Term Goal 2 (Week 1): Pt will complete car transfer with mod assist. PT Short Term Goal 3 (Week 1): Pt will ambulate 50 ft with LRAD & mod assist.  Skilled Therapeutic Interventions/Progress Updates:  Treatment 1: Pt received asleep in bed. Attempted to awaken pt with use of cold cloth on face, repositioning in bed, and sternal rub. Pt able to say "yes" only once and unable to open eyes on command. Pt's SpO2 = 95% on 2L/min and HR = 63 bpm. RN made aware of pt's decreased arousal. Pt left in bed with alarm set & all needs within reach. Pt missed 17 minutes of skilled PT treatment 2/2 decreased arousal.  Treatment 2: Pt received in bed with wife present in room. Pt slightly more alert upon therapist's arrival. Pt required assistance to don glasses correctly. Pt transferred to sitting EOB with total assist 2/2 absent initiation and inability to follow commands. Once sitting EOB pt able to transfer sit>stand with min assist. Pt required min assist for balance from NT/therapist while 2nd person donned new brief 2/2 urinary incontinence. Pt required total assist to don pants & max assist to don shirt as pt with poor attention to task. Pt completed stand pivot bed>w/c with min assist +2, requiring min assist with pivoting to chair. Pt requested drink & consumed nectar thick liquid with max cuing for small sips. Supplemental oxygen removed and SpO2 remained 92% or greater throughout remainder of session; RN made aware. Transported pt to/from gym via w/c total assist for time management & pt engaged in BUE exercises (chest press) with 3# weighted bar with therapist  providing demonstration during task. Pt began falling asleep during exercises. At end of session pt returned to room & left sitting in w/c with QRB donned, NT & family present. RN & PA aware of pt's increased lethargy today.  Therapy Documentation Precautions:  Precautions Precautions: Fall Restrictions Weight Bearing Restrictions: No  General: PT Amount of Missed Time (min): 17 Minutes PT Missed Treatment Reason: Patient fatigue(decreased arousal)   See Function Navigator for Current Functional Status.   Therapy/Group: Individual Therapy  Waunita Schooner 09/01/2017, 12:16 PM

## 2017-09-01 NOTE — Progress Notes (Signed)
Occupational Therapy Session Note  Patient Details  Name: Malik Rojas MRN: [id number] Date of Birth: 04-Apr-1932  Today's Date: 09/01/2017 OT Individual Time: 6314-9702 OT Individual Time Calculation (min): 76 min   Short Term Goals: Week 1:  OT Short Term Goal 1 (Week 1): Pt will perform toilet transfer with max A. OT Short Term Goal 2 (Week 1): Pt will demonstrate mod A standing balance during LB clothing management.  OT Short Term Goal 3 (Week 1): Pt will perform UB dressing with mod A in order to decrease level of assistance with self care.  Skilled Therapeutic Interventions/Progress Updates:    Pt greeted in w/c with spouse present. Somnolent, however agreeable to tx. Pt self propelled to RN station to retrieve nectar thick water from fridge. He was escorted remainder of way to gym for time management. While in gym, worked on sit<stands, standing tolerance, upright postural alignment, sustained attention, and alertness during game of checkers. After he consumed water, pt completed sit<stand with Min A and increased time to self organize/initiate. While standing at elevate table, pt required mod cues for maintaining forward gaze and max multimodal cues to follow discussion instructions of familiar game (per pt report). Pt externally distracted in moderately stimulating environment and would reach out for props unrelated to game. Tended to lean to Rt side when fatigued. Able to stand for 3 minute windows. Near end of session, pt requiring max cues to keep eyes open while playing game seated. He was escorted to dayroom, perked up when given nectar thick orange juice (though still lethargic). He was then escorted back to room and left with spouse and safety belt fastened. Pt unable to tell spouse what we had done in therapy.   Therapy Documentation Precautions:  Precautions Precautions: Fall Restrictions Weight Bearing Restrictions: No Vital Signs: Therapy Vitals BP: 110/62 Pain: No  s/s/ pain during session    ADL:      See Function Navigator for Current Functional Status.   Therapy/Group: Individual Therapy  Miral Hoopes A Renay Crammer 09/01/2017, 3:44 PM

## 2017-09-01 NOTE — Progress Notes (Signed)
Speech Language Pathology Daily Session Note  Patient Details  Name: Malik Rojas MRN: [id number] Date of Birth: 17-Jul-1932  Today's Date: 09/01/2017 SLP Individual Time: 1000-1016 SLP Individual Time Calculation (min): 16 min  Short Term Goals: Week 1: SLP Short Term Goal 1 (Week 1): Patient will consume current diet with minimal overt s/s of aspiration and Min A verbal cues for use of swallowing compensatory strategies.  SLP Short Term Goal 2 (Week 1): Patient will consume trials of thin liquids with minimal overt s/s of aspiration over 2 sessions with Min A verbal cues to assess readiness for upgrade vs repeat MBS SLP Short Term Goal 3 (Week 1): Patient will demonstrate sustained attention to a functional task for ~5 minutes with Min A verbal cues for redirection.  SLP Short Term Goal 4 (Week 1): Patient will identify 1 physical and 1 cognitive deficit with Max A multimodal cues.  SLP Short Term Goal 5 (Week 1): Patient will demonstrate functional problem solving for basic and familiar tasks with Mod A verbal cues.  SLP Short Term Goal 6 (Week 1): Patient will utilize external memory aids to maximize orientation with Mod A verbal and visual cues.   Skilled Therapeutic Interventions: Skilled treatment session focused on dysphagia and cognition goals. SLP facilitated session by providing Max A cues for brief periods of alertness. SLP further facilitated session by providing skilled observation of pt consuming ice chips during these brief periods of alertness also in effort to further stimulate pt. Pt able to consume 2 spoonfuls of ice chips without overt s/s of aspiration. Then pt unable to maintain enough alertness for safe PO intake. Pt was left upright in bed with all needs within reach. Continue per current plan of care.      Function:  Eating Eating   Modified Consistency Diet: No(Trial ice chips with SLP) Eating Assist Level: Helper feeds patient     Helper Scoops Food on  Utensil: Every scoop     Cognition Comprehension Comprehension assist level: Understands basic less than 25% of the time/ requires cueing >75% of the time  Expression   Expression assist level: Expresses basis less than 25% of the time/requires cueing >75% of the time.  Social Interaction Social Interaction assist level: Interacts appropriately less than 25% of the time. May be withdrawn or combative.  Problem Solving Problem solving assist level: Solves basic less than 25% of the time - needs direction nearly all the time or does not effectively solve problems and may need a restraint for safety  Memory Memory assist level: Recognizes or recalls less than 25% of the time/requires cueing greater than 75% of the time    Pain    Therapy/Group: Individual Therapy  Sacheen Arrasmith 09/01/2017, 12:26 PM

## 2017-09-01 NOTE — Discharge Instructions (Addendum)
Inpatient Rehab Discharge Instructions  [facility] Discharge date and time:    Activities/Precautions/ Functional Status: Activity: no lifting, driving, or strenuous exercise for till cleared by MD Diet:  Wound Care: keep wound clean and dry    Functional status:  ___ No restrictions     ___ Walk up steps independently _X__ 24/7 supervision/assistance   ___ Walk up steps with assistance ___ Intermittent supervision/assistance  ___ Bathe/dress independently ___ Walk with walker     ___ Bathe/dress with assistance ___ Walk Independently    ___ Shower independently ___ Walk with assistance    ___ Shower with assistance _X__ No alcohol     ___ Return to work/school ________   Special Instructions: 1. Weight yourself today and daily. Contact cardiology if your weight goes up 2 lbs overnight or 5 lbs in 2 days or if you develop swelling or Shortness of breath. 2. No driving   My questions have been answered and I understand these instructions. I will adhere to these goals and the provided educational materials after my discharge from the hospital.  Patient/Caregiver Signature _______________________________ Date __________  Clinician Signature _______________________________________ Date __________  Please bring this form and your medication list with you to all your follow-up doctor's appointments.   Information on my medicine - Coumadin   (Warfarin)  This medication education was reviewed with me or my healthcare representative as part of my discharge preparation.    Why was Coumadin prescribed for you? Coumadin was prescribed for you because you have a blood clot or a medical condition that can cause an increased risk of forming blood clots. Blood clots can cause serious health problems by blocking the flow of blood to the heart, lung, or brain. Coumadin can prevent harmful blood clots from forming. As a reminder your indication for Coumadin is:   Deep Vein Thrombosis  Treatment  What test will check on my response to Coumadin? While on Coumadin (warfarin) you will need to have an INR test regularly to ensure that your dose is keeping you in the desired range. The INR (international normalized ratio) number is calculated from the result of the laboratory test called prothrombin time (PT).  If an INR APPOINTMENT HAS NOT ALREADY BEEN MADE FOR YOU please schedule an appointment to have this lab work done by your health care provider within 7 days. Your INR goal is usually a number between:  2 to 3 or your provider may give you a more narrow range like 2-2.5.  Ask your health care provider during an office visit what your goal INR is.  What  do you need to  know  About  COUMADIN? Take Coumadin (warfarin) exactly as prescribed by your healthcare provider about the same time each day.  DO NOT stop taking without talking to the doctor who prescribed the medication.  Stopping without other blood clot prevention medication to take the place of Coumadin may increase your risk of developing a new clot or stroke.  Get refills before you run out.  What do you do if you miss a dose? If you miss a dose, take it as soon as you remember on the same day then continue your regularly scheduled regimen the next day.  Do not take two doses of Coumadin at the same time.  Important Safety Information A possible side effect of Coumadin (Warfarin) is an increased risk of bleeding. You should call your healthcare provider right away if you experience any of the following: ? Bleeding from  an injury or your nose that does not stop. ? Unusual colored urine (red or dark brown) or unusual colored stools (red or black). ? Unusual bruising for unknown reasons. ? A serious fall or if you hit your head (even if there is no bleeding).  Some foods or medicines interact with Coumadin (warfarin) and might alter your response to warfarin. To help avoid this: ? Eat a balanced diet, maintaining a  consistent amount of Vitamin K. ? Notify your provider about major diet changes you plan to make. ? Avoid alcohol or limit your intake to 1 drink for women and 2 drinks for men per day. (1 drink is 5 oz. wine, 12 oz. beer, or 1.5 oz. liquor.)  Make sure that ANY health care provider who prescribes medication for you knows that you are taking Coumadin (warfarin).  Also make sure the healthcare provider who is monitoring your Coumadin knows when you have started a new medication including herbals and non-prescription products.  Coumadin (Warfarin)  Major Drug Interactions  Increased Warfarin Effect Decreased Warfarin Effect  Alcohol (large quantities) Antibiotics (esp. Septra/Bactrim, Flagyl, Cipro) Amiodarone (Cordarone) Aspirin (ASA) Cimetidine (Tagamet) Megestrol (Megace) NSAIDs (ibuprofen, naproxen, etc.) Piroxicam (Feldene) Propafenone (Rythmol SR) Propranolol (Inderal) Isoniazid (INH) Posaconazole (Noxafil) Barbiturates (Phenobarbital) Carbamazepine (Tegretol) Chlordiazepoxide (Librium) Cholestyramine (Questran) Griseofulvin Oral Contraceptives Rifampin Sucralfate (Carafate) Vitamin K   Coumadin (Warfarin) Major Herbal Interactions  Increased Warfarin Effect Decreased Warfarin Effect  Garlic Ginseng Ginkgo biloba Coenzyme Q10 Green tea St. Johns wort    Coumadin (Warfarin) FOOD Interactions  Eat a consistent number of servings per week of foods HIGH in Vitamin K (1 serving =  cup)  Collards (cooked, or boiled & drained) Kale (cooked, or boiled & drained) Mustard greens (cooked, or boiled & drained) Parsley *serving size only =  cup Spinach (cooked, or boiled & drained) Swiss chard (cooked, or boiled & drained) Turnip greens (cooked, or boiled & drained)  Eat a consistent number of servings per week of foods MEDIUM-HIGH in Vitamin K (1 serving = 1 cup)  Asparagus (cooked, or boiled & drained) Broccoli (cooked, boiled & drained, or raw & chopped) Brussel  sprouts (cooked, or boiled & drained) *serving size only =  cup Lettuce, raw (green leaf, endive, romaine) Spinach, raw Turnip greens, raw & chopped   These websites have more information on Coumadin (warfarin):  FailFactory.se; VeganReport.com.au;

## 2017-09-02 ENCOUNTER — Inpatient Hospital Stay (HOSPITAL_COMMUNITY): Payer: Medicare Other | Admitting: Occupational Therapy

## 2017-09-02 ENCOUNTER — Inpatient Hospital Stay (HOSPITAL_COMMUNITY): Payer: Medicare Other | Admitting: Physical Therapy

## 2017-09-02 ENCOUNTER — Inpatient Hospital Stay (HOSPITAL_COMMUNITY): Payer: Medicare Other

## 2017-09-02 ENCOUNTER — Inpatient Hospital Stay (HOSPITAL_COMMUNITY): Payer: Medicare Other | Admitting: Speech Pathology

## 2017-09-02 LAB — BASIC METABOLIC PANEL
ANION GAP: 8 (ref 5–15)
BUN: 77 mg/dL — ABNORMAL HIGH (ref 6–20)
CALCIUM: 8.4 mg/dL — AB (ref 8.9–10.3)
CO2: 29 mmol/L (ref 22–32)
Chloride: 104 mmol/L (ref 101–111)
Creatinine, Ser: 2.84 mg/dL — ABNORMAL HIGH (ref 0.61–1.24)
GFR calc Af Amer: 22 mL/min — ABNORMAL LOW (ref >60)
GFR calc non Af Amer: 19 mL/min — ABNORMAL LOW (ref >60)
GLUCOSE: 159 mg/dL — AB (ref 65–99)
Potassium: 5.6 mmol/L — ABNORMAL HIGH (ref 3.5–5.1)
Sodium: 141 mmol/L (ref 135–145)

## 2017-09-02 LAB — CBC
HCT: 40 % (ref 39.0–52.0)
HEMOGLOBIN: 12.5 g/dL — AB (ref 13.0–17.0)
MCH: 31.2 pg (ref 26.0–34.0)
MCHC: 31.3 g/dL (ref 30.0–36.0)
MCV: 99.8 fL (ref 78.0–100.0)
Platelets: 193 10*3/uL (ref 150–400)
RBC: 4.01 MIL/uL — ABNORMAL LOW (ref 4.22–5.81)
RDW: 14.6 % (ref 11.5–15.5)
WBC: 7.4 10*3/uL (ref 4.0–10.5)

## 2017-09-02 LAB — GLUCOSE, CAPILLARY
GLUCOSE-CAPILLARY: 154 mg/dL — AB (ref 65–99)
GLUCOSE-CAPILLARY: 95 mg/dL (ref 65–99)
GLUCOSE-CAPILLARY: 101 mg/dL — AB (ref 65–99)
Glucose-Capillary: 93 mg/dL (ref 65–99)

## 2017-09-02 LAB — PROTIME-INR
INR: 2.54
PROTHROMBIN TIME: 27.1 s — AB (ref 11.4–15.2)

## 2017-09-02 MED ORDER — SODIUM POLYSTYRENE SULFONATE 15 GM/60ML PO SUSP: 30.0000 g | Freq: Once | ORAL | Status: DC

## 2017-09-02 MED ORDER — SODIUM POLYSTYRENE SULFONATE PO POWD
30.0000 g | Freq: Once | ORAL | Status: DC
  Filled 2017-09-02: qty 30

## 2017-09-02 MED ORDER — FUROSEMIDE 10 MG/ML IJ SOLN
20.0000 mg | Freq: Every day | INTRAMUSCULAR | Status: DC
  Filled 2017-09-02: qty 2

## 2017-09-02 MED ORDER — QUETIAPINE FUMARATE 25 MG PO TABS: 12.5000 mg | ORAL_TABLET | Freq: Every day | ORAL | Status: DC

## 2017-09-02 MED ORDER — SODIUM POLYSTYRENE SULFONATE PO POWD
15.0000 g | Freq: Once | ORAL | Status: DC
  Filled 2017-09-02: qty 15

## 2017-09-02 MED ORDER — SODIUM POLYSTYRENE SULFONATE PO POWD
30.0000 g | Freq: Once | ORAL | Status: AC
  Administered 2017-09-02: 30 g via RECTAL
  Filled 2017-09-02: qty 30

## 2017-09-02 NOTE — Progress Notes (Signed)
Physical Therapy Session Note  Patient Details  Name: Malik Rojas MRN: [id number] Date of Birth: [birth year]  Today's Date: 09/02/2017   Skilled Therapeutic Interventions/Progress Updates:  Pt in bed with wife Henrine Screws) present in room. Pt's wife reports pt is awaiting an enema and has not be able to awaken today. Pt able to briefly open eyes with therapist spoke to him. Consulted with RN who reports pt is awaiting an enema & CT scan and recommends holding therapy at this time. Will f/u per POC.  Therapy Documentation Precautions:  Precautions Precautions: Fall Restrictions Weight Bearing Restrictions: No   General: PT Amount of Missed Time (min): 60 Minutes PT Missed Treatment Reason: Patient fatigue   See Function Navigator for Current Functional Status.   Therapy/Group: Individual Therapy  Waunita Schooner 09/02/2017, 1:15 PM

## 2017-09-02 NOTE — Progress Notes (Signed)
Physical Therapy Note  Patient Details  Name: Malik Rojas MRN: [id number] Date of Birth: [birth year] Today's Date: 09/02/2017    Pt's plan of care adjusted to 15/7 after speaking with care team and discussed with MD in team conference as pt currently unable to tolerate current therapy schedule with OT, PT, and SLP.     Waunita Schooner 09/02/2017, 2:56 PM

## 2017-09-02 NOTE — Progress Notes (Signed)
Occupational Therapy Note  Patient Details  Name: Malik Rojas MRN: [id number] Date of Birth: Dec 26, 1931  Today's Date: 09/02/2017 OT Missed Time: 101 Minutes Missed Time Reason: CT/MRI  Pt missed 60 minutes of skilled OT intervention secondary to CT scan.    Gypsy Decant 09/02/2017, 3:48 PM

## 2017-09-02 NOTE — Progress Notes (Signed)
SLP Cancellation Note  Patient Details Name: Malik Rojas MRN: [id number] DOB: [birth year]   Cancelled treatment:       Patient missed 30 minutes of skilled SLP intervention due to fatigue. Upon arrival, RN in room and attempting to arouse patient without success. Despite Max A verbal and tactile cues, multiple attempts, cold washcloths on face and rolling/respotioning in bed, patient unable to maintain arousal, therefore, patient left upright in bed with RN present.                                                                                                 Ermal Brzozowski 09/02/2017, 9:20 AM

## 2017-09-02 NOTE — Progress Notes (Signed)
[facility] PHYSICAL MEDICINE & REHABILITATION     PROGRESS NOTE    Subjective/Complaints: Pt slept last night per RN. No attempts to get out of bed. Hard to wake up this morning.   ROS: Limited due to cognitive/behavioral   Objective: Vital Signs: Blood pressure (!) 144/52, pulse 69, temperature 97.8 F (36.6 C), temperature source Oral, resp. rate 16, weight 97.1 kg (214 lb 1.1 oz), SpO2 95 %. No results found. Recent Labs    09/02/17 0708  WBC 7.4  HGB 12.5*  HCT 40.0  PLT 193   Recent Labs    09/01/17 1218 09/02/17 0708  NA 141 141  K 5.5* 5.6*  CL 103 104  GLUCOSE 218* 159*  BUN 79* 77*  CREATININE 2.85* 2.84*  CALCIUM 8.5* 8.4*   CBG (last 3)  Recent Labs    09/01/17 1640 09/01/17 2029 09/02/17 0626  GLUCAP 161* 135* 154*    Wt Readings from Last 3 Encounters:  09/02/17 97.1 kg (214 lb 1.1 oz)  09/02/2017 96.3 kg (212 lb 4.9 oz)  07/22/17 105.2 kg (232 lb)    Physical Exam:  Gen: lying in bed. somnolent   HENT:  Head: Normocephalic and atraumatic.  Mouth/Throat: Oropharynx is clear and moist.  Eyes: EOMI.  Neck: Normal range of motion. Neck supple.  Cardiovascular:.RRR without murmur. No JVD    .  Respiratory: CTA Bilaterally without wheezes or rales. Normal effort  GI: Stomach distended bowel sounds positive.  Musculoskeletal: He exhibits edema bilateral LE's, .  Edema RLE. Stasis changes BLE with flaky skin---persistent Neurological: slowed processing.  Follows simple commands.   Skin: Skin is warm and dry. He is not diaphoretic. Chronic venous changes in legs Psychiatric: pt arouses and cooperates with basic commands.     Assessment/Plan: 1.  Functional and cognitive deficits secondary to anoxic brain injury/debility which require 3+ hours per day of interdisciplinary therapy in a comprehensive inpatient rehab setting. Physiatrist is providing close team supervision and 24 hour management of active medical problems listed  below. Physiatrist and rehab team continue to assess barriers to discharge/monitor patient progress toward functional and medical goals.  Function:  Bathing Bathing position   Position: Shower  Bathing parts Body parts bathed by patient: Right arm, Left arm, Chest, Abdomen, Front perineal area, Right upper leg, Left upper leg, Buttocks Body parts bathed by helper: Right lower leg, Left lower leg, Back  Bathing assist Assist Level: 2 helpers      Upper Body Dressing/Undressing Upper body dressing   What is the patient wearing?: Pull over shirt/dress     Pull over shirt/dress - Perfomed by patient: Thread/unthread right sleeve, Thread/unthread left sleeve, Put head through opening Pull over shirt/dress - Perfomed by helper: Thread/unthread right sleeve, Thread/unthread left sleeve, Put head through opening, Pull shirt over trunk        Upper body assist Assist Level: More than reasonable time, 2 helpers   Set up : To obtain clothing/put away  Lower Body Dressing/Undressing Lower body dressing   What is the patient wearing?: Pants     Pants- Performed by patient: Thread/unthread right pants leg, Thread/unthread left pants leg, Pull pants up/down Pants- Performed by helper: Thread/unthread right pants leg, Thread/unthread left pants leg, Pull pants up/down   Non-skid slipper socks- Performed by helper: Don/doff right sock, Don/doff left sock                  Lower body assist Assist for lower body dressing: 2 Helpers  Toileting Toileting Toileting activity did not occur: No continent bowel/bladder event Toileting steps completed by patient: Adjust clothing after toileting Toileting steps completed by helper: Adjust clothing prior to toileting, Performs perineal hygiene, Adjust clothing after toileting Toileting Assistive Devices: Grab bar or rail  Toileting assist Assist level: Supervision or verbal cues   Transfers Chair/bed transfer   Chair/bed transfer  method: Stand pivot Chair/bed transfer assist level: Touching or steadying assistance (Pt > 75%) Chair/bed transfer assistive device: Armrests, Walker, Bedrails     Locomotion Ambulation     Max distance: 30 ft Assist level: Moderate assist (Pt 50 - 74%)   Wheelchair Wheelchair activity did not occur: Safety/medical concerns Type: Manual Max wheelchair distance: 30' Assist Level: Supervision or verbal cues  Cognition Comprehension Comprehension assist level: Understands basic less than 25% of the time/ requires cueing >75% of the time  Expression can make Expression assist level: Expresses basic 25 - 49% of the time/requires cueing 50 - 75% of the time. Uses single words/gestures.  Social Interaction Social Interaction assist level: Interacts appropriately less than 25% of the time. May be withdrawn or combative.  Problem Solving Problem solving assist level: Solves basic less than 25% of the time - needs direction nearly all the time or does not effectively solve problems and may need a restraint for safety  Memory Memory assist level: Recognizes or recalls less than 25% of the time/requires cueing greater than 75% of the time   Medical Problem List and Plan:  1. Functional and cognitive deficits secondary to anoxic brain injury and debility    -Continue PT, OT, ST. continued cognitive deficits   -team conf today 2. Right femoral vein/iliac veinDVT /Anticoagulation: Pharmaceutical: Coumadin.  INR therapeutic today   3. Pain Management: Tylenol. Limit neuro sedating medications due to arousal  4. Mood: Team to provide ego support and motivation as possible. Social worker to screen.  5. Neuropsych: This patient is not fully capable of making decisions on his own behalf.  Normalize sleep-wake patterns  6. Skin/Wound Care: Routine pressure relief measures.   7. Fluids/Electrolytes/Nutrition: . Continues on nectar liquids due to dysphagia.    -Encourage p.o. Fluids.  Intake is  reasonable 8. HTN: Monitor BP bid. On coreg, hydralazine and Isordil.  9. Acute on chronic combined CHF: Monitor weights daily. Strict I/O. No ACE/ARB due to CKD. Continue coreg, hydralazine, Isordil and pravastatin.     -Continue to monitor weights--stable Filed Weights   08/31/17 0231 09/01/17 0228 09/02/17 0059  Weight: 97 kg (213 lb 13.5 oz) 97.2 kg (214 lb 4.6 oz) 97.1 kg (214 lb 1.1 oz)    10. Acute on chronic renal failure (CK III): Continue to monitor renal status serially. BUN/SCR peaked at 137/4.4 ---77/2.84.   -persistent hyperkalemia: Potassium 5.6 today, received kayexalate this weekend   -will give 30g today, may help with constipation too   -will request renal input re:   mgt of k+/kidneys moving forward   -need more accurate I's and O's 11. T2DM: Monitor BS ac/hs. On lantus 10 units bid with SSI for elevated BS.    -Increased Lantus to 12 units twice daily on 1/28   -improved control over last 24 hours   -Consider morning oral agent  12. Leukocytosis: Monitor for signs of infection.  White count down to 7.4 most recently  LOS (Days) 7 A FACE TO FACE EVALUATION WAS PERFORMED  Meredith Staggers, MD 09/02/2017 9:01 AM

## 2017-09-02 NOTE — Progress Notes (Signed)
ANTICOAGULATION CONSULT NOTE   Pharmacy Consult for Coumadin  Indication: DVT  No Known Allergies  Patient Measurements: Weight: 214 lb 1.1 oz (97.1 kg)  HDWt: 92.1 kg  Vital Signs: BP: 144/52 (01/29 0848) Pulse Rate: 69 (01/29 0848)  Labs: Recent Labs    08/31/17 0448 09/01/17 0634 09/01/17 1218 09/02/17 0708  HGB  --   --   --  12.5*  HCT  --   --   --  40.0  PLT  --   --   --  193  LABPROT 27.0* 27.2*  --  27.1*  INR 2.53 2.55  --  2.54  CREATININE  --   --  2.85* 2.84*    Estimated Creatinine Clearance: 23 mL/min (A) (by C-G formula based on SCr of 2.84 mg/dL (H)).   Assessment: 84 year old male s/p prolonged CPR with CHF, respiratory failure, AKI, now on coumadin for new DVT, completed 5 days of heparin overlap. No bleeding noted per  Chart. Last CBC on 1/23 INR 2.45 remains therapeutic today, noted pt is hard to arouse today, ordered head CT  Goal of Therapy:  INR 2-3 Monitor platelets by anticoagulation protocol: Yes   Plan:  Warfarin 5 mg po daily for now Monitor daily INR, CBC Will f/u CT results when available.  Thank you for allowing [place] to participate in this patients care.  Maryanna Shape, PharmD, BCPS  Clinical Pharmacist  Pager: [phone]   09/02/2017 3:08 PM

## 2017-09-02 NOTE — Consult Note (Signed)
[facility] Consult Note     Date: 09/02/2017                  Patient Name:  Malik Rojas  MRN: [id number]  DOB: 04-21-1932  Age / Sex: 84 y.o., male         PCP: Denita Lung, MD                 Service Requesting Consult: Rehab- Dr. Naaman Plummer                 Reason for Consult: CKD IV and hyperkalemia            Chief Complaint: Cardiac arrest  HPI: Pt is an 83M with a PMH significant for HTN. HLD, CAD, sCHF, and CKD who was initially admitted 08/11/2017 following a VF cardiac arrest.  He was admitted to the ICU, cooled, and was able to be extubated.  He had severe deconditioning and anoxic encephalopathy necessitating admission to rehab.   We are asked to see for AKI on CKD and hyperkalemia.    It appears pt's baseline Cr was around 2.0.  In the setting of his arrest, his Cr got as high as the mid-high 4s and Bun 137.  He did not require dialysis.  He was initially given IV Lasix but this has been held for the past week or so d/t the metabolic derangements.  Cr is actually getting better- is around 2.8 with a BUN of 77.    Has been more difficult to arouse over the past 24 hrs.  No fevers or hypotensive episodes.  Bed weights have drifted up.  He is wearing [place] O2.  He is able to nod yes/ no to simple questions.  Doesn't open eyes a whole lot.  CT head negative for acute process.  Past Medical History:  Diagnosis Date  . Basal cell carcinoma    "scalp, arms, right face;  some cut, some burndt off"  . CAD (coronary artery disease)    LHC 3/14: Distal left main 10%, LAD 20%, circumflex 10-20%, proximal RCA 20%, distal 40-50%, EF 50-55%.  . Chronic combined systolic and diastolic CHF, NYHA class 4 ([place]) 11/06/2016   Echo 4/18: severe LVH, EF 45-50, global HK, Gr 1 DD, mild AS (mean 9/peak 17), MAC, mod LAE, trivial eff  . Complete heart block (HCC)    Status post Medtronic pacemaker  . High cholesterol   . HTN (hypertension)   . Hx of cardiovascular stress  test    a. [place] MV 3/14:  Low risk, EF 36%, apical inf scar, no ischemia, inf-apical HK>> catheterization however demonstrated normal left ventricular function  . Melanoma ([place])    back  . Obesity   . Pneumonia 1940  . Presence of permanent cardiac pacemaker    Medtronic  . Type II diabetes mellitus ([place])   . Urinary frequency   . Urinary incontinence     Past Surgical History:  Procedure Laterality Date  . APPENDECTOMY  40 years ago  . BASAL CELL CARCINOMA EXCISION     "scalp, right face"  . INSERT / REPLACE / REMOVE PACEMAKER  ~ 1999   medtronic EnRhythm  . MOHS SURGERY Left    ear; being monitored-every 6 months  . PERMANENT PACEMAKER GENERATOR CHANGE N/A 02/12/2013   Procedure: PERMANENT PACEMAKER GENERATOR CHANGE;  Surgeon: Deboraha Sprang, MD;  Location: [facility] CATH LAB;  Service: Cardiovascular;  Laterality: N/A;  . TONSILLECTOMY  as child  . TRANSURETHRAL RESECTION OF PROSTATE N/A 10/13/2014   Procedure: WOLF TRANSURETHRAL RESECTION OF THE PROSTATE (TURP);  Surgeon: Carolan Clines, MD;  Location: [facility] ORS;  Service: Urology;  Laterality: N/A;    Family History  Problem Relation Age of Onset  . Leukemia Mother    Social History:  reports that he quit smoking about 44 years ago. He has a 90.00 pack-year smoking history. he has never used smokeless tobacco. He reports that he drinks alcohol. He reports that he does not use drugs.  Allergies: No Known Allergies  Medications Prior to Admission  Medication Sig Dispense Refill  . aspirin 81 MG tablet Take 81 mg by mouth daily.    . carvedilol (COREG) 25 MG tablet Take 1 tablet (25 mg total) 2 (two) times daily by mouth. 180 tablet 3  . furosemide (LASIX) 40 MG tablet Take 1 tablet (40 mg total) by mouth daily as needed for fluid or edema (weight increase 2lbs in 48 hours or 5 lbs in 7 days.). 30 tablet 0  . hydrALAZINE (APRESOLINE) 25 MG tablet Take 1 tablet (25 mg total) by mouth every 8 (eight) hours. 90 tablet 0  .  insulin aspart (NOVOLOG) 100 UNIT/ML injection Inject 0-9 Units into the skin 3 (three) times daily with meals. Glucose 121-150 use 1 unit of insulin, 151-200 use 2 units, 201-250 use 4 units, 251-300 use 6 units, 301-350 use 8 units, 351 or greater use 10 units. 10 mL 11  . insulin glargine (LANTUS) 100 UNIT/ML injection Inject 0.1 mLs (10 Units total) into the skin 2 (two) times daily. 6 mL 0  . isosorbide dinitrate (ISORDIL) 10 MG tablet Take 1 tablet (10 mg total) by mouth 3 (three) times daily. 90 tablet 0  . pravastatin (PRAVACHOL) 80 MG tablet Take 1 tablet (80 mg total) by mouth daily. 90 tablet 1  . warfarin (COUMADIN) 5 MG tablet Take 1 tablet (5 mg total) by mouth daily. 30 tablet 11    Results for orders placed or performed during the hospital encounter of 08/14/2017 (from the past 48 hour(s))  Glucose, capillary     Status: Abnormal   Collection Time: 08/31/17  9:05 PM  Result Value Ref Range   Glucose-Capillary 191 (H) 65 - 99 mg/dL  Glucose, capillary     Status: Abnormal   Collection Time: 09/01/17  6:27 AM  Result Value Ref Range   Glucose-Capillary 154 (H) 65 - 99 mg/dL   Comment 1 Notify RN   Protime-INR     Status: Abnormal   Collection Time: 09/01/17  6:34 AM  Result Value Ref Range   Prothrombin Time 27.2 (H) 11.4 - 15.2 seconds   INR 2.55   Glucose, capillary     Status: Abnormal   Collection Time: 09/01/17 12:03 PM  Result Value Ref Range   Glucose-Capillary 205 (H) 65 - 99 mg/dL  Basic metabolic panel     Status: Abnormal   Collection Time: 09/01/17 12:18 PM  Result Value Ref Range   Sodium 141 135 - 145 mmol/L   Potassium 5.5 (H) 3.5 - 5.1 mmol/L   Chloride 103 101 - 111 mmol/L   CO2 26 22 - 32 mmol/L   Glucose, Bld 218 (H) 65 - 99 mg/dL   BUN 79 (H) 6 - 20 mg/dL   Creatinine, Ser 2.85 (H) 0.61 - 1.24 mg/dL   Calcium 8.5 (L) 8.9 - 10.3 mg/dL   GFR calc non Af Amer 19 (L) >60 mL/min  GFR calc Af Amer 22 (L) >60 mL/min    Comment: (NOTE) The eGFR has  been calculated using the CKD EPI equation. This calculation has not been validated in all clinical situations. eGFR's persistently <60 mL/min signify possible Chronic Kidney Disease.    Anion gap 12 5 - 15  Glucose, capillary     Status: Abnormal   Collection Time: 09/01/17  4:40 PM  Result Value Ref Range   Glucose-Capillary 161 (H) 65 - 99 mg/dL  Glucose, capillary     Status: Abnormal   Collection Time: 09/01/17  8:29 PM  Result Value Ref Range   Glucose-Capillary 135 (H) 65 - 99 mg/dL   Comment 1 Notify RN   Glucose, capillary     Status: Abnormal   Collection Time: 09/02/17  6:26 AM  Result Value Ref Range   Glucose-Capillary 154 (H) 65 - 99 mg/dL  Protime-INR     Status: Abnormal   Collection Time: 09/02/17  7:08 AM  Result Value Ref Range   Prothrombin Time 27.1 (H) 11.4 - 15.2 seconds   INR 2.53   Basic metabolic panel     Status: Abnormal   Collection Time: 09/02/17  7:08 AM  Result Value Ref Range   Sodium 141 135 - 145 mmol/L   Potassium 5.6 (H) 3.5 - 5.1 mmol/L   Chloride 104 101 - 111 mmol/L   CO2 29 22 - 32 mmol/L   Glucose, Bld 159 (H) 65 - 99 mg/dL   BUN 77 (H) 6 - 20 mg/dL   Creatinine, Ser 2.84 (H) 0.61 - 1.24 mg/dL   Calcium 8.4 (L) 8.9 - 10.3 mg/dL   GFR calc non Af Amer 19 (L) >60 mL/min   GFR calc Af Amer 22 (L) >60 mL/min    Comment: (NOTE) The eGFR has been calculated using the CKD EPI equation. This calculation has not been validated in all clinical situations. eGFR's persistently <60 mL/min signify possible Chronic Kidney Disease.    Anion gap 8 5 - 15  CBC     Status: Abnormal   Collection Time: 09/02/17  7:08 AM  Result Value Ref Range   WBC 7.4 4.0 - 10.5 K/uL   RBC 4.01 (L) 4.22 - 5.81 MIL/uL   Hemoglobin 12.5 (L) 13.0 - 17.0 g/dL   HCT 40.0 39.0 - 52.0 %   MCV 99.8 78.0 - 100.0 fL   MCH 31.2 26.0 - 34.0 pg   MCHC 31.3 30.0 - 36.0 g/dL   RDW 14.6 11.5 - 15.5 %   Platelets 193 150 - 400 K/uL  Glucose, capillary     Status: None    Collection Time: 09/02/17 11:36 AM  Result Value Ref Range   Glucose-Capillary 93 65 - 99 mg/dL  Glucose, capillary     Status: None   Collection Time: 09/02/17  4:58 PM  Result Value Ref Range   Glucose-Capillary 95 65 - 99 mg/dL   Ct Head Wo Contrast  Result Date: 09/02/2017 CLINICAL DATA:  Encephalopathy EXAM: CT HEAD WITHOUT CONTRAST TECHNIQUE: Contiguous axial images were obtained from the base of the skull through the vertex without intravenous contrast. COMPARISON:  08/11/2017 FINDINGS: Brain: There is atrophy and chronic small vessel disease changes. No acute intracranial abnormality. Specifically, no hemorrhage, hydrocephalus, mass lesion, acute infarction, or significant intracranial injury. Vascular: No hyperdense vessel or unexpected calcification. Skull: No acute calvarial abnormality. Sinuses/Orbits: No acute finding Other: None IMPRESSION: Atrophy. Advanced chronic microvascular disease throughout the deep white matter. No acute intracranial  abnormality. Electronically Signed   By: Rolm Baptise M.D.   On: 09/02/2017 16:17    ROS: unable to obtain due to pt's inability to respond to questions  Blood pressure (!) 153/48, pulse 65, temperature 98.8 F (37.1 C), temperature source Oral, resp. rate 17, weight 97.1 kg (214 lb 1.1 oz), SpO2 94 %. Physical Exam  GEN elderly gentleman, lying in bed HEENT sclerae anicteric NECK + HJR PULM tachypneic, can't really follow commands to take a good deep breath, clear anteriorly, few crackles bilateral bases CV RRR systolic murmur LUSB ABD distended, hypoactive bowel sounds.  No masses/ organomegaly felt EXT + burnished skin bilateral LEs, some skin flaking.  1+ LE edema, trace UE edema NEURO able to intermittently follow simple commands  Assessment/Plan  1.  AKI on CKD IV: due to ATN in the setting of cardiac arrest.  Interestingly, he has gotten more difficult to arouse as his BUN has improved.  He is a poor dialysis candidate.  I'll do  a renal US to make sure he doesn't have obstruction although I don't feel a large bladder on exam.  As his renal function wasn't robust to begin with, he may not return to prior baseline.  2.  Hyperkalemia: mild, < 6.0.  Got a Kayexelate enema today.  He appears slightly vol overloaded so will restart Lasix (will given IV equivalent of home PO dosing) and follow closely which will also help K.  Switch to renal diet.  3.  Acute systolic CHF exac: restart Lasix as above  4.  Acute encephalopathy: unclear source as of yet.  Recommend infectious workup with blood/urine cultures, CXR.  Checking for obstruction as in #1.  Would also ensure constipation not an issue as can contribute.  On [place] O2 and has not been hypoxic.    5.  Anoxic brain injury/ severe deconditioning: in CIR.    6.  Dispo: pending   Madelon Lips, MD [facility] pgr [phone] 09/02/2017, 6:06 PM

## 2017-09-02 NOTE — Progress Notes (Signed)
Patient still hard to arouse. RN reports better night with Seroquel --question sedative SE? Will check CT head as on coumadin.

## 2017-09-02 NOTE — Progress Notes (Signed)
Occupational Therapy Note  Patient Details  Name: Malik Rojas MRN: [id number] Date of Birth: 1932/02/18  Today's Date: 09/02/2017 OT Individual Time: 0102-7253 OT Individual Time Calculation (min): 25 min  and Today's Date: 09/02/2017 OT Missed Time: 35 Minutes Missed Time Reason: Patient fatigue  Pt missed 35 minutes of skilled OT intervention secondary to lethargy. Pt sleeping soundly upon entering the room and unable to arouse pt from sleep. OT moving pt's LE's from bed in an attempt to sit pt up for alertness but pt did not open eyes or attempt to participate. Cold cloth placed over pt's face and he made no attempt to removed it. Vitals WNLs this session. RN notified. Pt remained supine with bed alarm activated and call bell within reach.    Darleen Crocker P 09/02/2017, 10:04 AM

## 2017-09-03 ENCOUNTER — Telehealth: Payer: Self-pay | Admitting: Family Medicine

## 2017-09-03 ENCOUNTER — Inpatient Hospital Stay (HOSPITAL_COMMUNITY): Payer: Medicare Other | Admitting: Speech Pathology

## 2017-09-03 ENCOUNTER — Inpatient Hospital Stay (HOSPITAL_COMMUNITY): Payer: Medicare Other

## 2017-09-03 ENCOUNTER — Inpatient Hospital Stay (HOSPITAL_COMMUNITY): Payer: Medicare Other | Admitting: Physical Therapy

## 2017-09-03 ENCOUNTER — Inpatient Hospital Stay (HOSPITAL_COMMUNITY): Payer: Medicare Other | Admitting: Occupational Therapy

## 2017-09-03 LAB — GLUCOSE, CAPILLARY: Glucose-Capillary: 122 mg/dL — ABNORMAL HIGH (ref 65–99)

## 2017-09-03 MED FILL — Medication: Qty: 1 | Status: AC

## 2017-09-05 NOTE — Telephone Encounter (Signed)
Sympathy card sent 

## 2017-09-05 NOTE — Progress Notes (Signed)
Patient returned from renal scan via stretcher, respond to name and able to states he knew he was in the hospital. Incontinent care provided,O2 sats 92% on nasal oxygen  0220 Sudden changes noted in respiratory status O2 sats  82% respiration 26, Lungs field throughout with rales and rhonchi, patient is a LCB with notification of Rapid Response  and Bipap only, Notified Rapid Response  RN arrived to floor and initiation of emergency standard in progress, Respiratory Therapist to department. IV started to right wrist area Lasix IV administered as ordered. Pt condition to decline , patient was pronounced br RN x2( Rapid Response, RN and [name], RN @ [phone].. MD made aware of changed. Family was notifed. Support provided to family once arrived to department.  Marland Kitchen

## 2017-09-05 NOTE — Progress Notes (Signed)
Received a call from charge nurse: She reports Mr. Malik Rojas has Diffuse Rales with Oxygen desaturation Oxygen Saturation 82%. Dr. Hollie Salk seen patient on 09/02/2017, she ordered Lasix. This was not given. Nurse was instructed to give the Lasix also Dr. Hollie Salk recommended blood cultures,  urine cultures, and CXR. This was ordered. I asked charge nurse to have the  Rapid Response Nurse come and evaluate Mr. Malik Rojas. Rapid Response Nurse arrived to the floor, we will continue to monitor. Awaiting results.

## 2017-09-05 NOTE — Progress Notes (Signed)
Spoke with Danella Sensing regarding change in pt condition, orders received. Rapid Response RN called and to floor and respiratory called. Pt has IV line inserted and lasix given as ordered. Pt continued to decline and was pronounced at 0255 by 2 RN's. CDS notified of pt expiration. Dtr called by primary care nurse.

## 2017-09-05 NOTE — Progress Notes (Signed)
Received call from [place] ( charge Nurse), stating Malik Rojas passed away at 02:55, he was pronounced by two nurses. He was a partial code, Rapid Response Nurse was on the floor during this  time. Collie Siad will place a call to [employer] Leisure centre manager). This provider will let Dr. Naaman Plummer know in the morning, charge nurse is aware.

## 2017-09-05 NOTE — Progress Notes (Addendum)
Charge RN Manuela Schwartz called for pt desating to 82% on 5L [place]. On arrival pt sitting upright in bed slumped over to the Left, unresponsive to painful or verbal stimuli, diaphoretic, lungs wet throughout, guppy breathing. IV placed by floor RN prior to arrival, order for Lasix 40 mg IVP given. Sats 52% on 5L [place], no NRB available, requested RN to call RT for Bipap as pt continued to desat to 31%. Pt assisted with BVM ventilation while waiting for RT. He was a partial code, Bipap only. There was a concern for placing pt on bipap due to his increased level of unresponsiveness and inability to protect his airway. Oral airway suctioned with frothy white sputum, no gag reflex noted. Pt continued to decline, time of death pronounced at 0255, verified by 2 RN's, RRT Magda Paganini and primary [employer].   Charge Teacher, adult education on the phone with Zella Ball NP during this event.

## 2017-09-05 NOTE — Death Summary Note (Signed)
DEATH SUMMARY   Patient Details  Name: Malik Rojas MRN: [id number] DOB: [birth year]  Admission/Discharge Information   Admit Date:  Sep 08, 2017  Date of Death: Date of Death: 09-16-2017  Time of Death: Time of Death: 0255  Length of Stay: 8  Referring Physician: Denita Lung, MD   Reason(s) for Hospitalization  Anoxic Brain injury with debility  Diagnoses  Preliminary cause of death: Acute respiratory failure Secondary Diagnoses (including complications and co-morbidities):  Principal Problem:   Anoxic encephalopathy ([place]) Active Problems:   Chronic kidney disease, stage III (moderate) (HCC)   Acute on chronic combined systolic and diastolic CHF, NYHA class 3 ([place])   Oropharyngeal dysphagia   Diabetes mellitus type 2 in obese [facility])   [place] Hospital Course (including significant findings, care, treatment, and services provided and events leading to death)  Malik Rojas is a 84 y.o. year old male who was orignially admitted on 08/11/2017 after cardiac arrest and required  37 Minutes CPR with ACLS protocol and pulse regained on arrival to ED. He was responsive and intubated at admission but not felt to be a candidate for cardiac cath. He was treated with cooling protocol and  2D echo showed diffuse hypokinesis with severe LVH and EF 25-30 %. CT head showed global atrophy, no hemorrhage and poor definition of anterolateral thalamus and lenticular nucleus question due to changes of hypoxia. Hospital course significant for fluid overload, DVT RLE, acute kidney injury as well as issues bouts of confusion and lethargy. Cardiopulmonary arrest was felt to be respiratory in nature and repeat echo 1/20 showed improvement in EF to 40-45% with no plans for ischemic work up. He was noted to be debilitated with evidence of anoxic encephalopathy. CIR was recommended due to functional deficits.  He was admitted to rehab on 09-08-17 for intensive therapy to consist of PT, OT and ST at least 3  hours per day. He continued to be limited by cognitive and behavioral issues with variable participation in therapy.  Blood pressures were monitored on bid basis and were stable. Daily weights showed slow upward trend but no signs of overload or respiratory issues noted with activity. Lasix was held due to AKI and renal status has been monitored closely with improvement in BUN/SCr to 77/2.84. He required mod to max assist with basic mobility and self care tasks. Po intake has been variable due to fluctuating mentation. He was maintained on coumadin during his stay and INR was therapeutic. he had slight drop in H/H felt to be due to hemodilution. White count has been stable without signs of infection.  He tolerated evaluation on 1/23 but has has had issues with poor activity tolerance and lethargy ever since. He continued to have significant cognitive delay requiring cues with extra time for attention and had difficulty completing simple ADL tasks.  He has had sleep wake disruption with agitation at nights and low dose Seroquel was added on 01/28 to help sleep hygiene. He had excessive sedation the next day that was felt to be due to SE of medications but CT head was done for work up due to coumadin on board. This was negative for bleed or for acute changes.    He continued to have issues with hyperkalemia despite treatment  with Kayexalate therefore nephrology was consulted for input. Dr. Hollie Salk recommended restarting lasix due to concerns of acute exacerbation of CHF as well as ruling out infection as cause of encephalopathy.  Patient developed respiratory distress early am of 2017-09-16. Patient  was partial code except for use of BIPAP therefore no heroic measures undertaken. He expired on 2017-10-03 at 0255 hrs.     Pertinent Labs and Studies  Significant Diagnostic Studies Ct Head Wo Contrast  Result Date: 09/02/2017 CLINICAL DATA:  Encephalopathy EXAM: CT HEAD WITHOUT CONTRAST TECHNIQUE: Contiguous axial  images were obtained from the base of the skull through the vertex without intravenous contrast. COMPARISON:  08/11/2017 FINDINGS: Brain: There is atrophy and chronic small vessel disease changes. No acute intracranial abnormality. Specifically, no hemorrhage, hydrocephalus, mass lesion, acute infarction, or significant intracranial injury. Vascular: No hyperdense vessel or unexpected calcification. Skull: No acute calvarial abnormality. Sinuses/Orbits: No acute finding Other: None IMPRESSION: Atrophy. Advanced chronic microvascular disease throughout the deep white matter. No acute intracranial abnormality. Electronically Signed   By: Rolm Baptise M.D.   On: 09/02/2017 16:17   Ct Head Wo Contrast  Result Date: 08/11/2017 CLINICAL DATA:  84 year old male post cardiac arrest with 37 minutes of CPR before return of spontaneous circulation. Initial encounter. EXAM: CT HEAD WITHOUT CONTRAST TECHNIQUE: Contiguous axial images were obtained from the base of the skull through the vertex without intravenous contrast. COMPARISON:  None. FINDINGS: Brain: No intracranial hemorrhage. Slightly poor definition anterolateral aspect of the thalamus and posterior lenticular nucleus bilaterally may indicate changes of hypoxia. Prominent chronic microvascular changes. Infarct anterior limb left internal capsule of indeterminate age. Global atrophy. No intracranial mass lesion noted on this unenhanced exam. Vascular: Vascular calcifications Skull: No skull fracture. Sinuses/Orbits: Exophthalmos. Opacification left frontal sinus, anterior ethmoid sinus air cells bilaterally and left maxillary sinus. Other: Mastoid air cells and middle ear cavities are clear IMPRESSION: No intracranial hemorrhage. Slightly poor definition anterolateral aspect of the thalamus and posterior lenticular nucleus bilaterally may indicate changes of hypoxia. Prominent chronic microvascular changes. Infarct anterior limb left internal capsule of indeterminate  age. Global atrophy. Opacification left frontal sinus, anterior ethmoid sinus air cells bilaterally and left maxillary sinus. Electronically Signed   By: Genia Del M.D.   On: 08/11/2017 16:51   US Renal  Result Date: Oct 03, 2017 CLINICAL DATA:  AKI EXAM: RENAL / URINARY TRACT ULTRASOUND COMPLETE COMPARISON:  None. FINDINGS: Right Kidney: Length: 11.7 cm. Right renal cyst measures 4.7 cm. Cortex is thinned and echogenic. No hydronephrosis. Left Kidney: Length: 11.4 cm. Cortex is echogenic. No mass or hydronephrosis visualized. Bladder: Appears normal for degree of bladder distention. Incidental note made of a 2.5 cm stone in the gallbladder. IMPRESSION: 1. Both kidneys are echogenic indicating chronic medical renal disease. Associated thinning of the right renal cortex. 2. No acute findings.  No hydronephrosis. 3. Cholelithiasis. Electronically Signed   By: Franki Cabot M.D.   On: 2017/10/03 00:53   Dg Chest Port 1 View  Result Date: 08/20/2017 CLINICAL DATA:  Respiratory failure. EXAM: PORTABLE CHEST 1 VIEW COMPARISON:  Radiograph of August 19, 2017. FINDINGS: Stable cardiomegaly. Left-sided pacemaker is unchanged in position. No pneumothorax is noted. Mild left basilar subsegmental atelectasis is noted. Stable bibasilar atelectasis or edema is noted with associated pleural effusions, right greater than left. Bony thorax is unremarkable. IMPRESSION: Stable bibasilar atelectasis or edema is noted with associated pleural effusions, right greater than left. Electronically Signed   By: Marijo Conception, M.D.   On: 08/20/2017 09:24   Dg Chest Portable 1 View  Result Date: 08/19/2017 CLINICAL DATA:  Respiratory failure. EXAM: PORTABLE CHEST 1 VIEW COMPARISON:  Radiograph of August 18, 2017. FINDINGS: Stable cardiomegaly. Atherosclerosis of thoracic aorta is noted. Left-sided pacemaker is unchanged  in position. No pneumothorax is noted. Endotracheal and nasogastric tubes have been removed. Stable bibasilar  opacities are noted concerning for edema or atelectasis with associated pleural effusions. Bony thorax is unremarkable. IMPRESSION: Aortic atherosclerosis. Endotracheal and nasogastric tubes have been removed. Stable bibasilar atelectasis or edema is noted with associated pleural effusions. Electronically Signed   By: Marijo Conception, M.D.   On: 08/19/2017 07:45   Dg Chest Port 1 View  Result Date: 08/18/2017 CLINICAL DATA:  Intubated patient, cardiac arrest, CHF respiratory failure, diabetes, former smoker. EXAM: PORTABLE CHEST 1 VIEW COMPARISON:  Portable chest x-ray of August 17, 2017 FINDINGS: The lungs are reasonably well inflated. There are bilateral pleural effusions layering posteriorly. The interstitial markings are slightly less conspicuous bilaterally today. The cardiac silhouette remains enlarged. The central pulmonary vascularity is less engorged and more distinct. The ICD is in stable position. There calcification in the wall of the thoracic aorta. The endotracheal tube tip projects approximately 3.4 cm above the carina. The esophagogastric tube tip and proximal port project below the inferior margin of the image. IMPRESSION: CHF with slight interval decrease in interstitial edema. Persistent bibasilar pleural effusions layering posteriorly. Thoracic aortic atherosclerosis. The support tubes are in reasonable position.  Assess Electronically Signed   By: David  Martinique M.D.   On: 08/18/2017 07:36   Dg Chest Port 1 View  Result Date: 08/17/2017 CLINICAL DATA:  ET tube EXAM: PORTABLE CHEST 1 VIEW COMPARISON:  08/16/2017 FINDINGS: Support devices are stable. Bilateral airspace opacities and layering effusions again noted. Cardiomegaly. No change since prior study. IMPRESSION: Stable bilateral airspace disease and layering effusions. Findings likely reflect edema/CHF. Electronically Signed   By: Rolm Baptise M.D.   On: 08/17/2017 07:22   Dg Chest Port 1 View  Result Date: 08/16/2017 CLINICAL  DATA:  Respiratory failure EXAM: PORTABLE CHEST 1 VIEW COMPARISON:  08/15/2017 FINDINGS: Endotracheal tube terminates 3.4 cm above carina.Nasogastric tube extends beyond the inferior aspect of the film. Dual lead pacer. Midline trachea. Cardiomegaly accentuated by AP portable technique. Small layering bilateral pleural effusions. No pneumothorax. Mild pulmonary venous congestion. Improved aeration, with persistent right greater than left base airspace disease. Atherosclerosis in the transverse aorta. IMPRESSION: Slightly improved aeration, with decreased airspace disease and interstitial edema. Mild pulmonary venous congestion and bilateral pleural effusions remain. Aortic Atherosclerosis (ICD10-I70.0). Electronically Signed   By: Abigail Miyamoto M.D.   On: 08/16/2017 10:51   Dg Chest Portable 1 View  Result Date: 08/15/2017 CLINICAL DATA:  Respiratory failure EXAM: PORTABLE CHEST 1 VIEW COMPARISON:  08/14/2017 FINDINGS: Support devices are stable. Cardiomegaly with vascular congestion. Bilateral airspace disease again noted, right greater than left, likely a combination of asymmetric edema and atelectasis, unchanged. Layering bilateral effusions. IMPRESSION: Continued CHF, bilateral effusions and bibasilar atelectasis. No real change. Electronically Signed   By: Rolm Baptise M.D.   On: 08/15/2017 09:19   Dg Chest Port 1 View  Result Date: 08/14/2017 CLINICAL DATA:  ETT present Dimas Millin failure EXAM: PORTABLE CHEST 1 VIEW COMPARISON:  08/13/2017 FINDINGS: The patient has a left-sided transvenous pacemaker with leads to the right atrium and right ventricle (off the image). A nasogastric tube is in place, tip off the film beyond the gastroesophageal junction. An endotracheal tube is in place, tip approximately 2 centimeters above the carina. There has been some improvement in aeration since the prior study. Significant bibasilar opacities persist which obscure the hemidiaphragms. The heart size is accentuated  by technique. IMPRESSION: 1. Slight improvement in pulmonary edema. 2. Persistent  bibasilar opacities. Electronically Signed   By: Nolon Nations M.D.   On: 08/14/2017 08:25   Dg Chest Port 1 View  Result Date: 08/13/2017 CLINICAL DATA:  Intubated patient.  Respiratory failure. EXAM: PORTABLE CHEST 1 VIEW COMPARISON:  08/11/2017 FINDINGS: Lung base opacity has increased when compared to the prior exam, now obscuring both hemidiaphragms. There is persistent interstitial thickening with hazy central airspace opacities consistent with pulmonary edema. No pneumothorax. Endotracheal tube and nasal/orogastric tube are stable in well positioned. IMPRESSION: 1. There is an increase in opacity at the lung bases compared to the previous day's study, which is consistent with enlargement of bilateral pleural effusions. 2. Persistent pulmonary edema, stable from the prior study. 3. Support apparatus is stable and well positioned. Electronically Signed   By: Lajean Manes M.D.   On: 08/13/2017 09:10   Dg Chest Portable 1 View  Result Date: 08/11/2017 CLINICAL DATA:  Intubation EXAM: PORTABLE CHEST 1 VIEW COMPARISON:  11/07/2016 FINDINGS: Endotracheal tube in good position. Duo lead pacemaker unchanged in position. NG tube in the stomach. Cardiac enlargement. Diffuse bilateral airspace disease most consistent with edema. Small right effusion. Bibasilar atelectasis. IMPRESSION: Endotracheal tube in good position Diffuse bilateral airspace disease most consistent with pulmonary edema. Electronically Signed   By: Franchot Gallo M.D.   On: 08/11/2017 14:38   Dg Abd Portable 1v  Result Date: 08/14/2017 CLINICAL DATA:  Increased orogastric tube output. Respiratory failure, intubated patient. EXAM: PORTABLE ABDOMEN - 1 VIEW COMPARISON:  Chest x-ray of today's date. FINDINGS: The esophagogastric tube tip in proximal port lie in the mid to distal gastric body. The bowel gas pattern is unremarkable. A femoral catheter is in  place on the right. IMPRESSION: No evidence of bowel obstruction or ileus. Reasonable positioning of the esophagogastric tube. Electronically Signed   By: David  Martinique M.D.   On: 08/14/2017 10:23   Dg Swallowing Func-speech Pathology  Result Date: 08/19/2017 Objective Swallowing Evaluation: Type of Study: MBS-Modified Barium Swallow Study  Patient Details Name: Roddy Bellamy MRN: [id number] Date of Birth: 1932-03-01 Today's Date: 08/19/2017 Time: SLP Start Time (ACUTE ONLY): 1148 -SLP Stop Time (ACUTE ONLY): 1205 SLP Time Calculation (min) (ACUTE ONLY): 17 min Past Medical History: Past Medical History: Diagnosis Date . Basal cell carcinoma   "scalp, arms, right face;  some cut, some burndt off" . CAD (coronary artery disease)   LHC 3/14: Distal left main 10%, LAD 20%, circumflex 10-20%, proximal RCA 20%, distal 40-50%, EF 50-55%. . Chronic combined systolic and diastolic CHF, NYHA class 4 ([place]) 11/06/2016  Echo 4/18: severe LVH, EF 45-50, global HK, Gr 1 DD, mild AS (mean 9/peak 17), MAC, mod LAE, trivial eff . Complete heart block (HCC)   Status post Medtronic pacemaker . High cholesterol  . HTN (hypertension)  . Hx of cardiovascular stress test   a. [place] MV 3/14:  Low risk, EF 36%, apical inf scar, no ischemia, inf-apical HK>> catheterization however demonstrated normal left ventricular function . Melanoma ([place])   back . Obesity  . Pneumonia 1940 . Presence of permanent cardiac pacemaker   Medtronic . Type II diabetes mellitus ([place])  . Urinary frequency  . Urinary incontinence  Past Surgical History: Past Surgical History: Procedure Laterality Date . APPENDECTOMY  40 years ago . BASAL CELL CARCINOMA EXCISION    "scalp, right face" . INSERT / REPLACE / REMOVE PACEMAKER  ~ 1999  medtronic EnRhythm . MOHS SURGERY Left   ear; being monitored-every 6 months . PERMANENT PACEMAKER GENERATOR  CHANGE N/A 02/12/2013  Procedure: PERMANENT PACEMAKER GENERATOR CHANGE;  Surgeon: Deboraha Sprang, MD;  Location: [facility] CATH LAB;   Service: Cardiovascular;  Laterality: N/A; . TONSILLECTOMY    as child . TRANSURETHRAL RESECTION OF PROSTATE N/A 10/13/2014  Procedure: WOLF TRANSURETHRAL RESECTION OF THE PROSTATE (TURP);  Surgeon: Carolan Clines, MD;  Location: [facility] ORS;  Service: Urology;  Laterality: N/A; HPI: This is a 84 year old man history of coronary artery disease, DM, HTN, complete heart block, pacemaker presents today status post cardiac arrest.Per chart resuscitation was 37 minutes with paced rhythm followed by return of pulses. Intubated 1/7-1/14. CXR 1/15 Aortic atherosclerosis. Endotracheal and nasogastric tubes have been removed. Stable bibasilar atelectasis or edema is noted with associated pleural effusions. CXR 1/14 CHF with slight interval decrease in interstitial edema. Persistent bibasilar pleural effusions layering posteriorly.  No Data Recorded Assessment / Plan / Recommendation CHL IP CLINICAL IMPRESSIONS 08/19/2017 Clinical Impression Pt's impairments are primarily related to oral phase with decreased lingual manipulation, lingual residue and decreased labial seal with difficulty transiting liquid into oral cavity. Laryngeal penetration with larger sip nectar and straw mild, above vocal cords. Overall timing of swallow was functional with one episode of trigger at valleculae. Mild vallecular residue due to reduced epiglottic inversion. Primary barriers to safe swallow are pt's suspected incomplete vocal cord adduction indicated by hoarse quality. Recommend nectar thick liquids, Dys 2, crush pills and assist for swallow precautions. Will see while in hospital.   SLP Visit Diagnosis Dysphagia, oropharyngeal phase (R13.12) Attention and concentration deficit following -- Frontal lobe and executive function deficit following -- Impact on safety and function Moderate aspiration risk   CHL IP TREATMENT RECOMMENDATION 08/19/2017 Treatment Recommendations Therapy as outlined in treatment plan below   Prognosis 08/19/2017 Prognosis  for Safe Diet Advancement Good Barriers to Reach Goals Behavior Barriers/Prognosis Comment -- CHL IP DIET RECOMMENDATION 08/19/2017 SLP Diet Recommendations Dysphagia 2 (Fine chop) solids;Nectar thick liquid Liquid Administration via Cup;No straw Medication Administration Crushed with puree Compensations Slow rate;Small sips/bites Postural Changes Seated upright at 90 degrees   CHL IP OTHER RECOMMENDATIONS 08/19/2017 Recommended Consults -- Oral Care Recommendations Oral care BID Other Recommendations --   CHL IP FOLLOW UP RECOMMENDATIONS 08/19/2017 Follow up Recommendations (No Data)   CHL IP FREQUENCY AND DURATION 08/19/2017 Speech Therapy Frequency (ACUTE ONLY) min 2x/week Treatment Duration 2 weeks      CHL IP ORAL PHASE 08/19/2017 Oral Phase Impaired Oral - Pudding Teaspoon -- Oral - Pudding Cup -- Oral - Honey Teaspoon -- Oral - Honey Cup Lingual/palatal residue Oral - Nectar Teaspoon -- Oral - Nectar Cup Lingual/palatal residue Oral - Nectar Straw Lingual/palatal residue Oral - Thin Teaspoon -- Oral - Thin Cup -- Oral - Thin Straw -- Oral - Puree Lingual/palatal residue Oral - Mech Soft Lingual/palatal residue Oral - Regular -- Oral - Multi-Consistency -- Oral - Pill -- Oral Phase - Comment --  CHL IP PHARYNGEAL PHASE 08/19/2017 Pharyngeal Phase Impaired Pharyngeal- Pudding Teaspoon -- Pharyngeal -- Pharyngeal- Pudding Cup -- Pharyngeal -- Pharyngeal- Honey Teaspoon -- Pharyngeal -- Pharyngeal- Honey Cup WFL Pharyngeal -- Pharyngeal- Nectar Teaspoon -- Pharyngeal -- Pharyngeal- Nectar Cup Delayed swallow initiation-vallecula Pharyngeal -- Pharyngeal- Nectar Straw Penetration/Aspiration during swallow Pharyngeal Material enters airway, remains ABOVE vocal cords and not ejected out Pharyngeal- Thin Teaspoon -- Pharyngeal -- Pharyngeal- Thin Cup -- Pharyngeal -- Pharyngeal- Thin Straw -- Pharyngeal -- Pharyngeal- Puree WFL Pharyngeal -- Pharyngeal- Mechanical Soft Pharyngeal residue - valleculae Pharyngeal --  Pharyngeal- Regular -- Pharyngeal -- Pharyngeal-  Multi-consistency -- Pharyngeal -- Pharyngeal- Pill -- Pharyngeal -- Pharyngeal Comment --  CHL IP CERVICAL ESOPHAGEAL PHASE 08/19/2017 Cervical Esophageal Phase WFL Pudding Teaspoon -- Pudding Cup -- Honey Teaspoon -- Honey Cup -- Nectar Teaspoon -- Nectar Cup -- Nectar Straw -- Thin Teaspoon -- Thin Cup -- Thin Straw -- Puree -- Mechanical Soft -- Regular -- Multi-consistency -- Pill -- Cervical Esophageal Comment -- No flowsheet data found. [place] Siren 08/19/2017, 1:44 PM  Orbie Pyo Colvin Caroli.Ed CCC-SLP Pager [phone]              Microbiology No results found for this or any previous visit (from the past 240 hour(s)).  Lab Basic Metabolic Panel: Recent Labs  Lab 08/29/17 0711 08/30/17 0840 09/01/17 1218 09/02/17 0708  NA 143 141 141 141  K 5.4* 5.1 5.5* 5.6*  CL 104 105 103 104  CO2 _0 GLUCOSE 141* 140* 218* 159*  BUN 102* 86* 79* 77*  CREATININE 3.04* 2.71* 2.85* 2.84*  CALCIUM 9.1 8.6* 8.5* 8.4*   Liver Function Tests: No results for input(s): AST, ALT, ALKPHOS, BILITOT, PROT, ALBUMIN in the last 168 hours. No results for input(s): LIPASE, AMYLASE in the last 168 hours. No results for input(s): AMMONIA in the last 168 hours. CBC: Recent Labs  Lab 09/02/17 0708  WBC 7.4  HGB 12.5*  HCT 40.0  MCV 99.8  PLT 193   Cardiac Enzymes: No results for input(s): CKTOTAL, CKMB, CKMBINDEX, TROPONINI in the last 168 hours. Sepsis Labs: Recent Labs  Lab 09/02/17 0708  WBC 7.4    Procedures/Operations  N/A   Bary Leriche September 19, 2017, 11:23 AM

## 2017-09-05 DEATH — deceased

## 2017-10-15 ENCOUNTER — Telehealth: Payer: Self-pay | Admitting: Family Medicine

## 2017-10-15 NOTE — Telephone Encounter (Signed)
Wife dropped off form for [name] to complete.  This was completed and was mailed to wife

## 2018-02-24 ENCOUNTER — Encounter: Payer: Medicare Other | Admitting: Internal Medicine

## 2018-03-19 IMAGING — CT CT HEAD W/O CM
5 of 8 series · 17 of 47 positions shown, 18 images · non-contrast
Comparison: 08/11/2017

CLINICAL DATA: Encephalopathy

EXAM:
CT HEAD WITHOUT CONTRAST
TECHNIQUE: Contiguous axial images were obtained from the base of the skull
through the vertex without intravenous contrast.

[Series 3: head without · axial · non-contrast · 0.45mm/px · z∈[-136,-51]mm · 3 of 35 slices shown, 4 images (1 of 2)]
[im 9/35  brain]
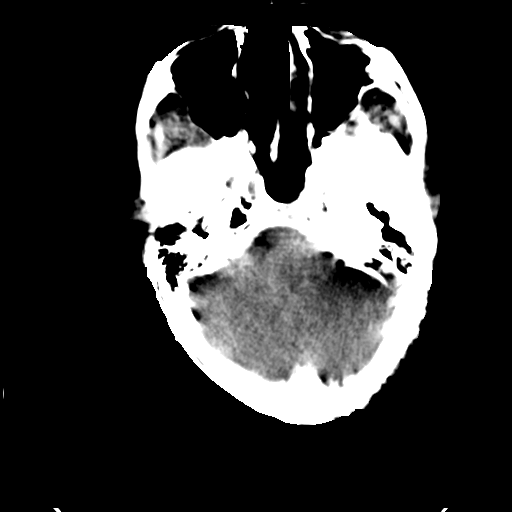
[im 9/35  bone]
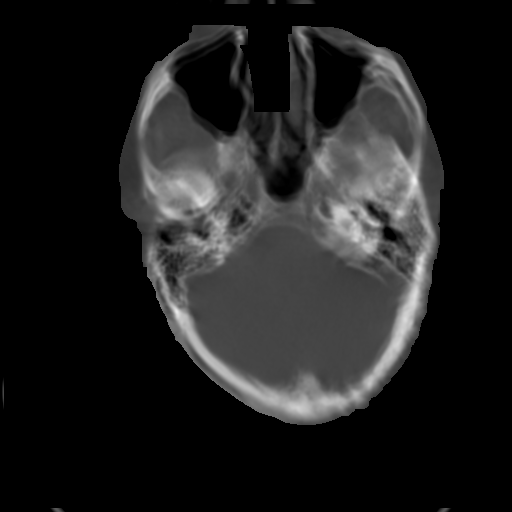
[im 18/35  brain]
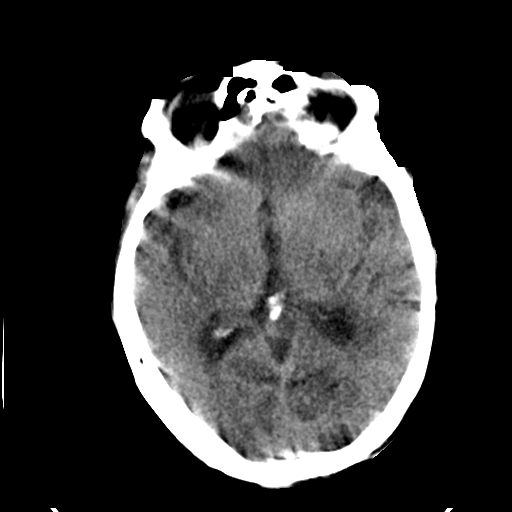
[im 26/35  brain]
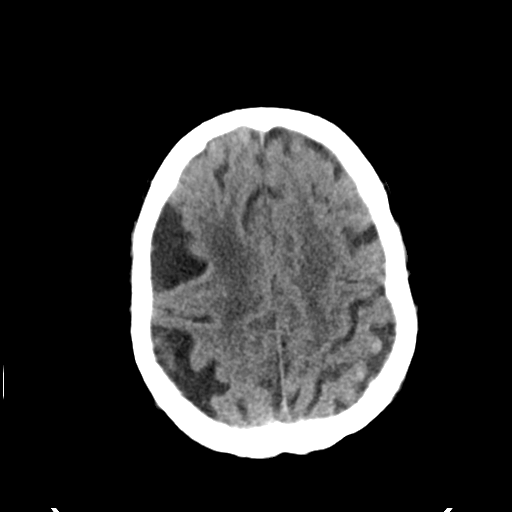

[Series 4: head bone · axial · 0.45mm/px · z∈[-162,-38]mm · 7 of 86 slices shown]
[im 8/86  bone]
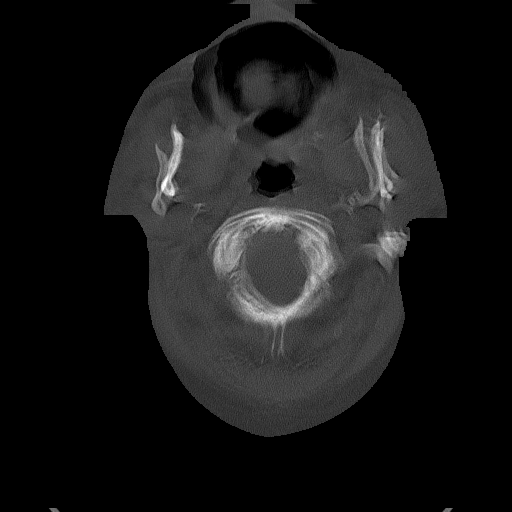
[im 16/86  bone]
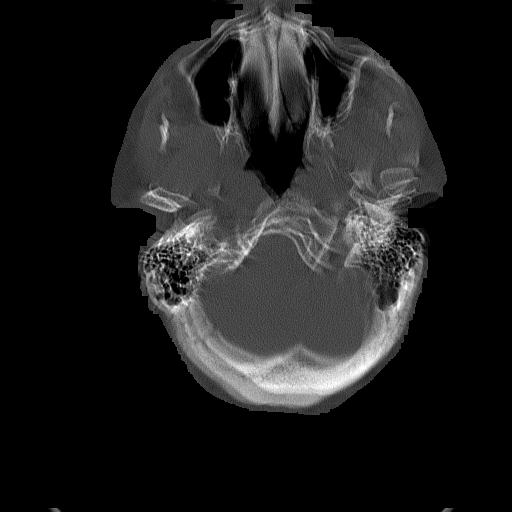
[im 31/86  bone]
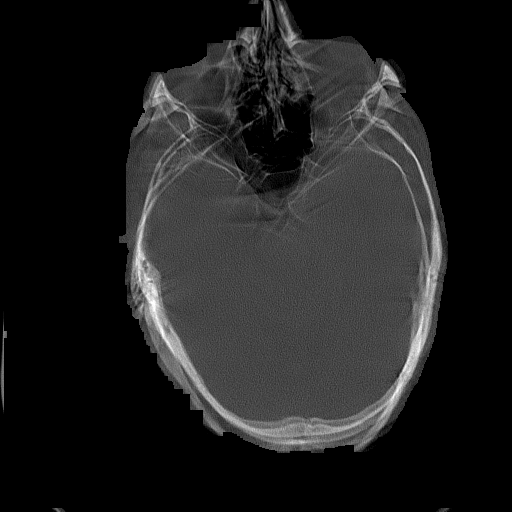
[im 39/86  bone]
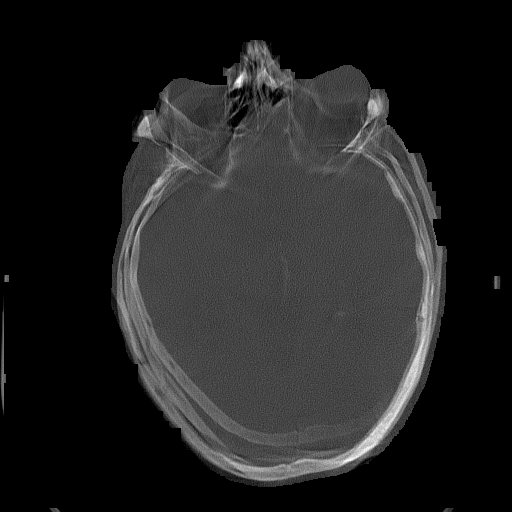
[im 47/86  bone]
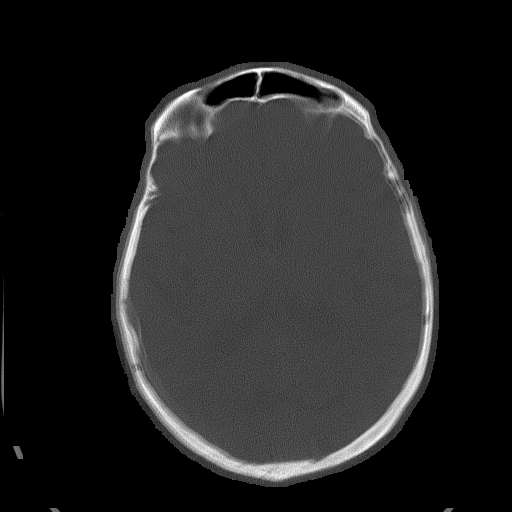
[im 55/86  bone]
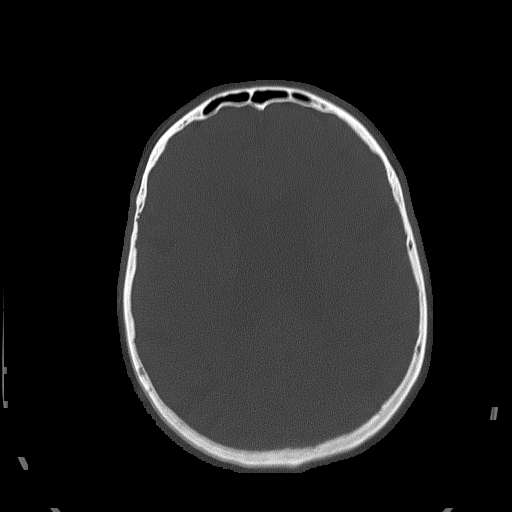
[im 70/86  bone]
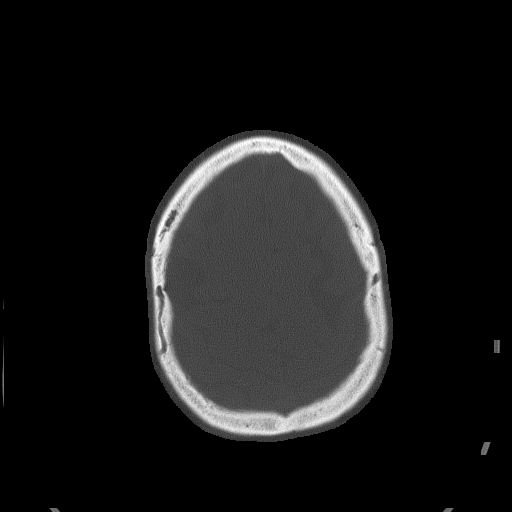

[Series 6: head without sag · sagittal · non-contrast · 0.33mm/px · 2 of 63 slices shown]
[im 21/63  brain]
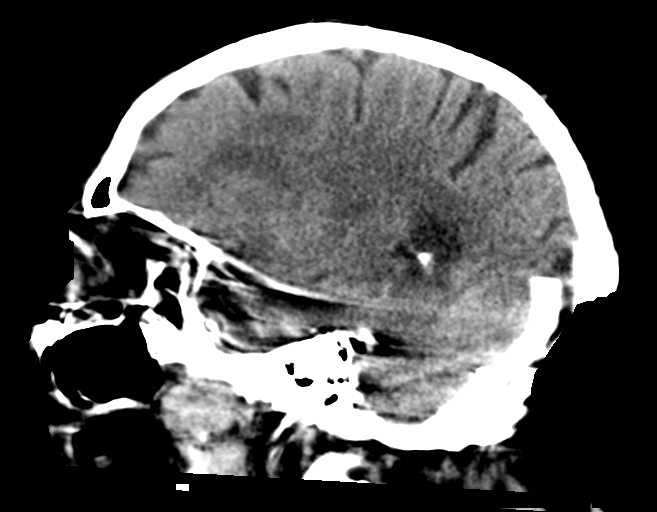
[im 42/63  brain]
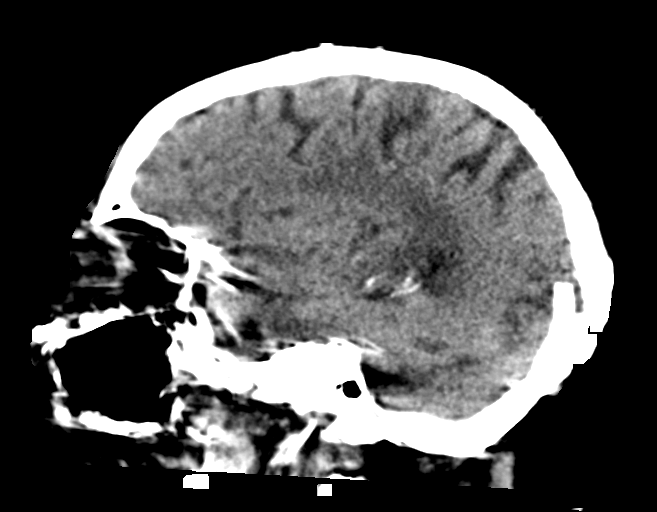

[Series 7: head without · axial · non-contrast · 0.47mm/px · z∈[-134,-94]mm · 2 of 26 slices shown (2 of 2)]
[im 9/26  brain]
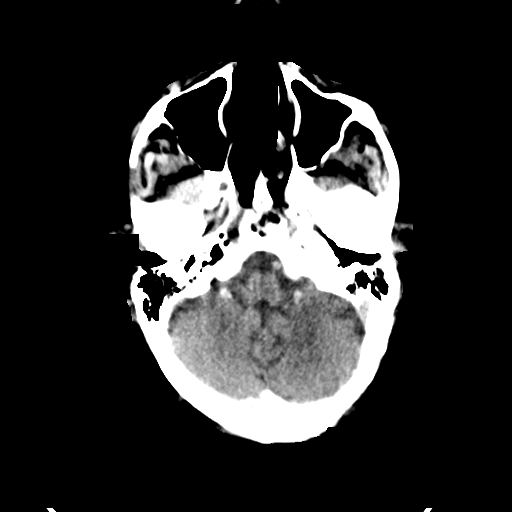
[im 17/26  brain]
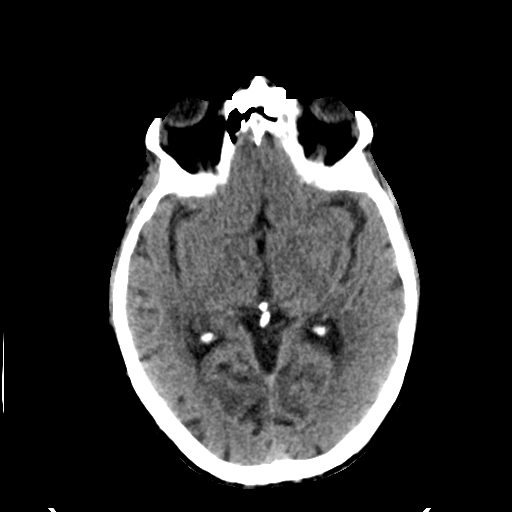

[Series 9: head without cor · coronal · non-contrast · 0.30mm/px · 3 of 72 slices shown]
[im 18/72  brain]
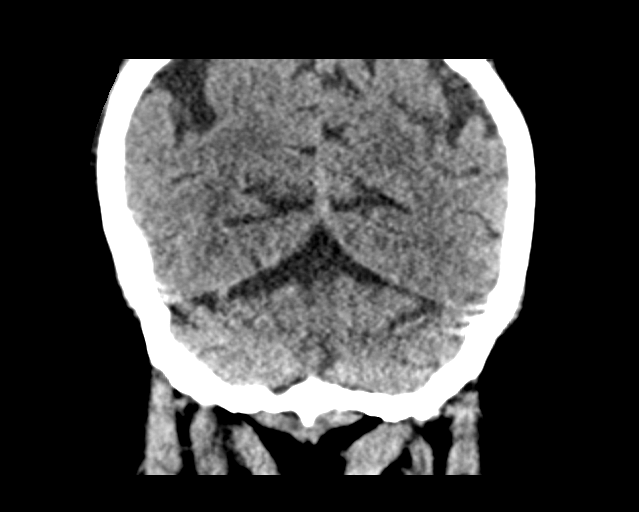
[im 36/72  brain]
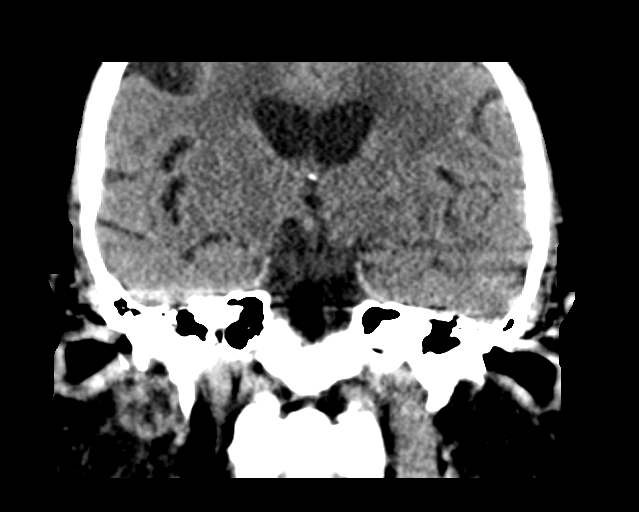
[im 54/72  brain]
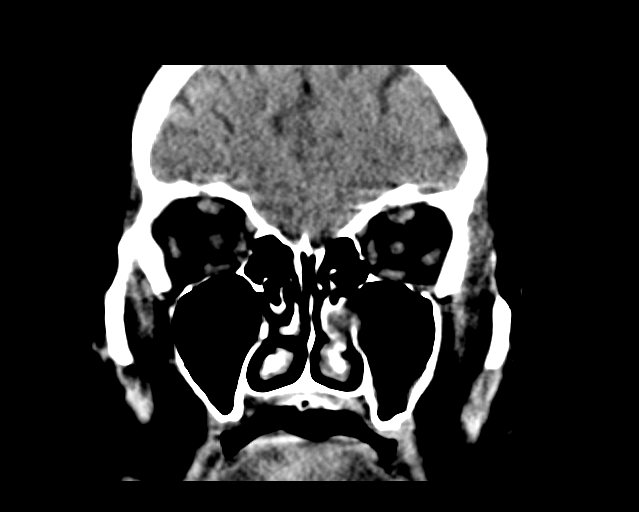

[17 of 47 positions shown; findings below may reference images not displayed]

FINDINGS: Brain: There is atrophy and chronic small vessel disease changes. No
acute intracranial abnormality. Specifically, no hemorrhage,
hydrocephalus, mass lesion, acute infarction, or significant
intracranial injury.

Vascular: No hyperdense vessel or unexpected calcification.

Skull: No acute calvarial abnormality.

Sinuses/Orbits: No acute finding

Other: None
IMPRESSION: Atrophy. Advanced chronic microvascular disease throughout the deep
white matter. No acute intracranial abnormality.
